# Patient Record
Sex: Male | Born: 1955
Health system: Southern US, Community
[De-identification: ages and names within clinical notes are randomized; demographics above are authoritative.]

## PROBLEM LIST (undated history)

## (undated) DIAGNOSIS — F32A Depression, unspecified: Secondary | ICD-10-CM

## (undated) DIAGNOSIS — F329 Major depressive disorder, single episode, unspecified: Secondary | ICD-10-CM

## (undated) DIAGNOSIS — F988 Other specified behavioral and emotional disorders with onset usually occurring in childhood and adolescence: Secondary | ICD-10-CM

## (undated) DIAGNOSIS — Z9889 Other specified postprocedural states: Secondary | ICD-10-CM

## (undated) DIAGNOSIS — I10 Essential (primary) hypertension: Secondary | ICD-10-CM

## (undated) DIAGNOSIS — E119 Type 2 diabetes mellitus without complications: Secondary | ICD-10-CM

## (undated) DIAGNOSIS — E785 Hyperlipidemia, unspecified: Secondary | ICD-10-CM

## (undated) DIAGNOSIS — R7303 Prediabetes: Secondary | ICD-10-CM

## (undated) DIAGNOSIS — R51 Headache: Secondary | ICD-10-CM

## (undated) DIAGNOSIS — R519 Headache, unspecified: Secondary | ICD-10-CM

## (undated) DIAGNOSIS — K7460 Unspecified cirrhosis of liver: Secondary | ICD-10-CM

## (undated) DIAGNOSIS — G4730 Sleep apnea, unspecified: Secondary | ICD-10-CM

## (undated) DIAGNOSIS — G4733 Obstructive sleep apnea (adult) (pediatric): Secondary | ICD-10-CM

## (undated) DIAGNOSIS — G459 Transient cerebral ischemic attack, unspecified: Secondary | ICD-10-CM

## (undated) DIAGNOSIS — F419 Anxiety disorder, unspecified: Secondary | ICD-10-CM

## (undated) DIAGNOSIS — I878 Other specified disorders of veins: Secondary | ICD-10-CM

## (undated) DIAGNOSIS — I639 Cerebral infarction, unspecified: Secondary | ICD-10-CM

## (undated) DIAGNOSIS — C801 Malignant (primary) neoplasm, unspecified: Secondary | ICD-10-CM

## (undated) DIAGNOSIS — E1140 Type 2 diabetes mellitus with diabetic neuropathy, unspecified: Secondary | ICD-10-CM

## (undated) DIAGNOSIS — F909 Attention-deficit hyperactivity disorder, unspecified type: Secondary | ICD-10-CM

## (undated) HISTORY — DX: Transient cerebral ischemic attack, unspecified: G45.9

## (undated) HISTORY — DX: Other specified disorders of veins: I87.8

## (undated) HISTORY — DX: Depression, unspecified: F32.A

## (undated) HISTORY — PX: CARPAL TUNNEL RELEASE: SHX101

## (undated) HISTORY — DX: Other specified behavioral and emotional disorders with onset usually occurring in childhood and adolescence: F98.8

## (undated) HISTORY — DX: Anxiety disorder, unspecified: F41.9

## (undated) HISTORY — DX: Obstructive sleep apnea (adult) (pediatric): G47.33

## (undated) HISTORY — DX: Sleep apnea, unspecified: G47.30

## (undated) HISTORY — DX: Hyperlipidemia, unspecified: E78.5

## (undated) HISTORY — PX: KNEE SURGERY: SHX244

## (undated) HISTORY — DX: Major depressive disorder, single episode, unspecified: F32.9

## (undated) HISTORY — PX: NECK SURGERY: SHX720

## (undated) HISTORY — DX: Essential (primary) hypertension: I10

## (undated) HISTORY — DX: Cerebral infarction, unspecified: I63.9

## (undated) HISTORY — DX: Type 2 diabetes mellitus without complications: E11.9

## (undated) HISTORY — DX: Prediabetes: R73.03

## (undated) HISTORY — DX: Other specified postprocedural states: Z98.890

## (undated) HISTORY — PX: VASECTOMY: SHX75

## (undated) HISTORY — DX: Unspecified cirrhosis of liver: K74.60

## (undated) HISTORY — DX: Type 2 diabetes mellitus with diabetic neuropathy, unspecified: E11.40

---

## 1985-03-07 DIAGNOSIS — F419 Anxiety disorder, unspecified: Secondary | ICD-10-CM

## 1985-03-07 HISTORY — DX: Anxiety disorder, unspecified: F41.9

## 2000-11-13 ENCOUNTER — Ambulatory Visit: Admission: RE | Admit: 2000-11-13 | Discharge: 2000-11-13 | Payer: Self-pay | Admitting: Family Medicine

## 2003-02-03 ENCOUNTER — Observation Stay (HOSPITAL_COMMUNITY): Admission: EM | Admit: 2003-02-03 | Discharge: 2003-02-04 | Payer: Self-pay | Admitting: Emergency Medicine

## 2003-02-19 ENCOUNTER — Ambulatory Visit (HOSPITAL_COMMUNITY): Admission: RE | Admit: 2003-02-19 | Discharge: 2003-02-19 | Payer: Self-pay | Admitting: Family Medicine

## 2003-06-19 IMAGING — CR DG CHEST 2V
2 series · 2 of 2 positions shown · non-contrast
Comparison: none

CLINICAL DATA: Cervical hernia nucleus pulposus. Patient has hypertension. No present chest complaints. Nonsmoker.
 TWO VIEW CHEST
 PA and lateral views of the chest without previous films for comparison at this time show the heart and lungs, bony thorax and soft tissues to be within normal limits.
 IMPRESSION 
 Normal chest.

[view not recorded (1 of 2)]
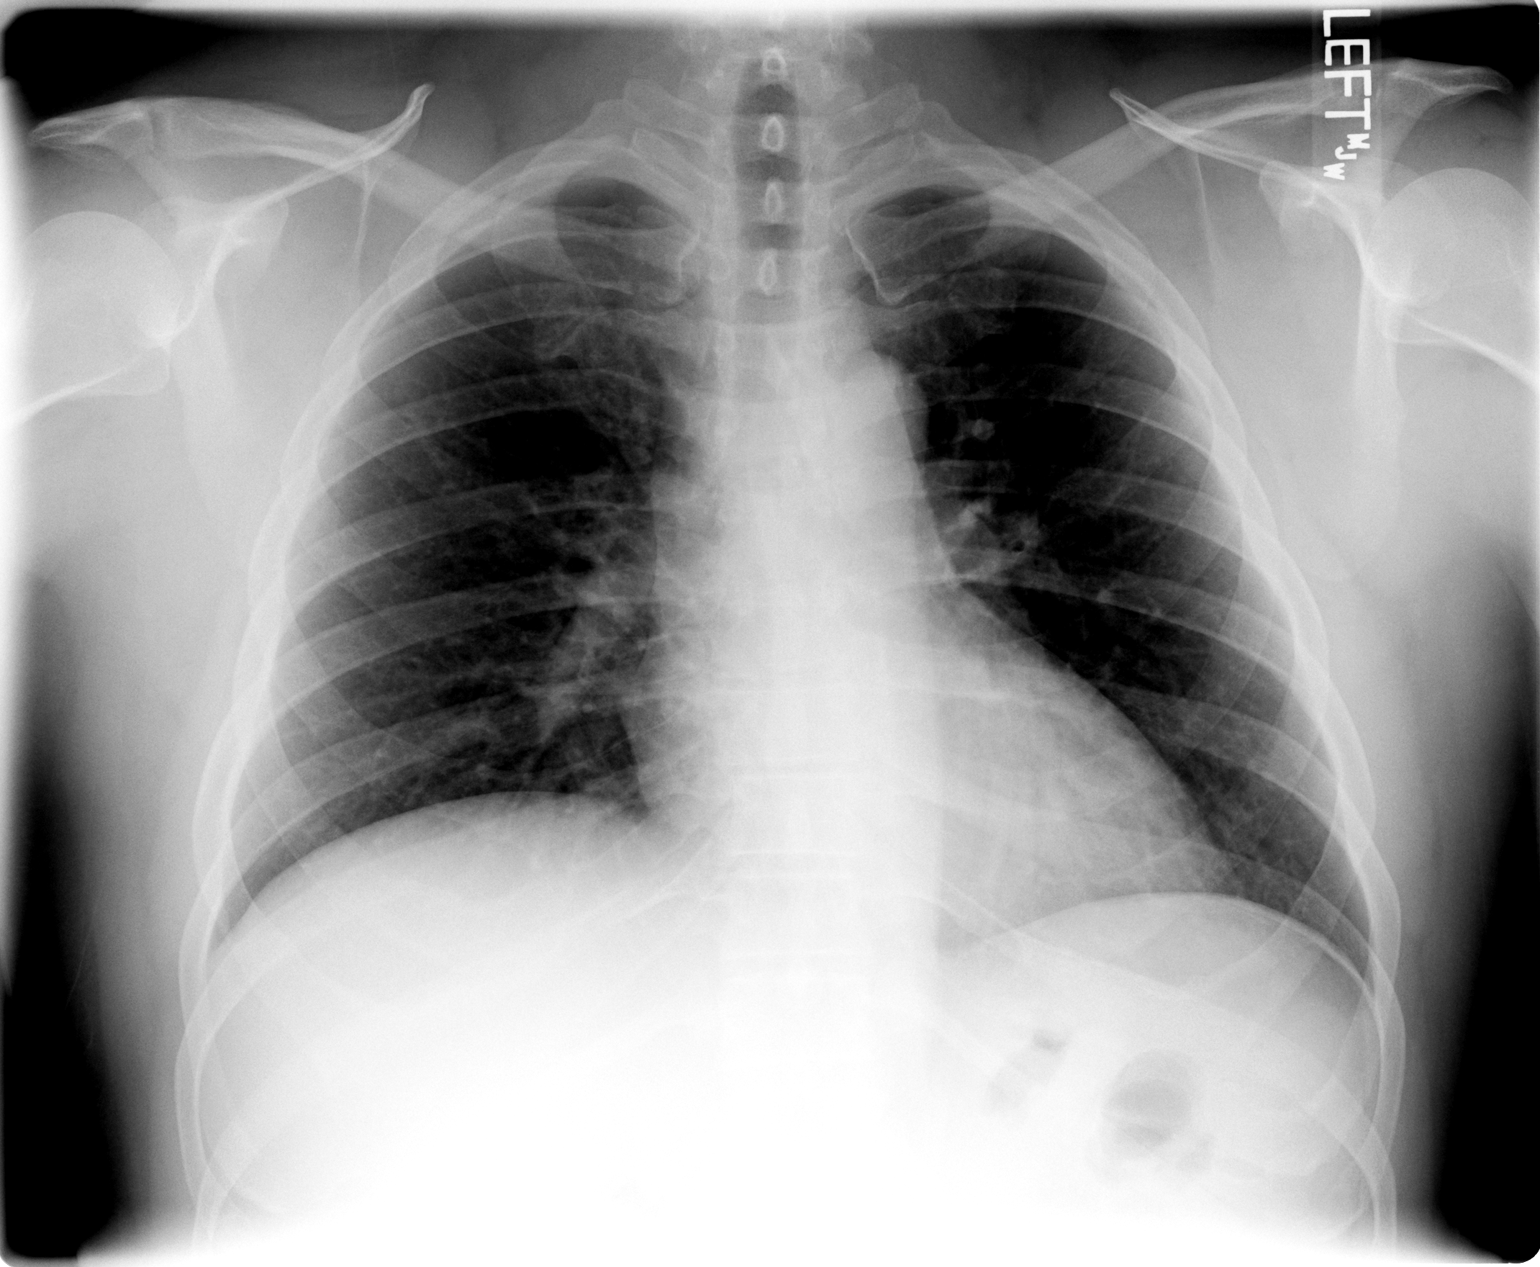

[view not recorded (2 of 2)]
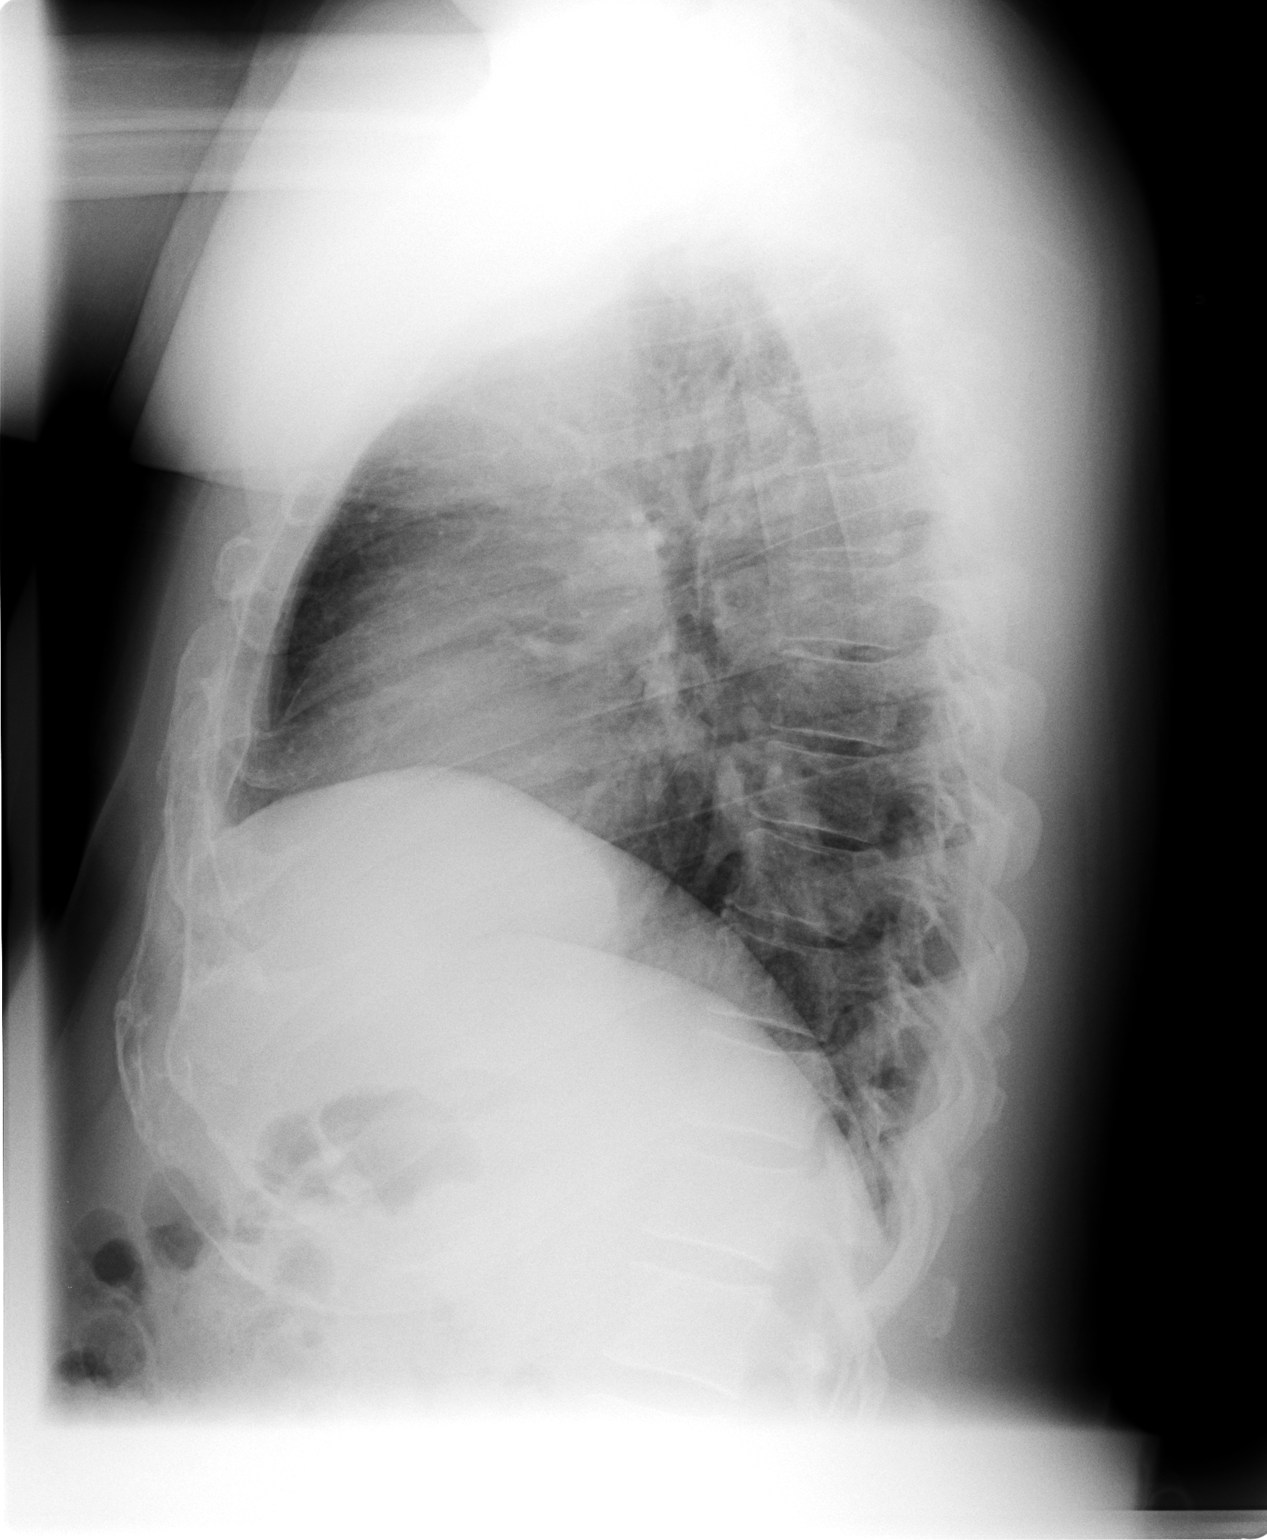

[2 of 2 positions shown; findings below may reference images not displayed]

## 2003-06-20 ENCOUNTER — Ambulatory Visit (HOSPITAL_COMMUNITY): Admission: RE | Admit: 2003-06-20 | Discharge: 2003-06-20 | Payer: Self-pay | Admitting: Neurosurgery

## 2003-06-20 IMAGING — RF DG CERVICAL SPINE 1V
1 series · 2 of 2 positions shown · non-contrast
Comparison: none

CLINICAL DATA: Anterior cervical diskectomy and fusion. 
C-ARM FLUOROSCOPY
Fluoroscopy was utilized by the requesting physician.

[Series 1: run · 2 of 2 slices shown]
[im 1/2]
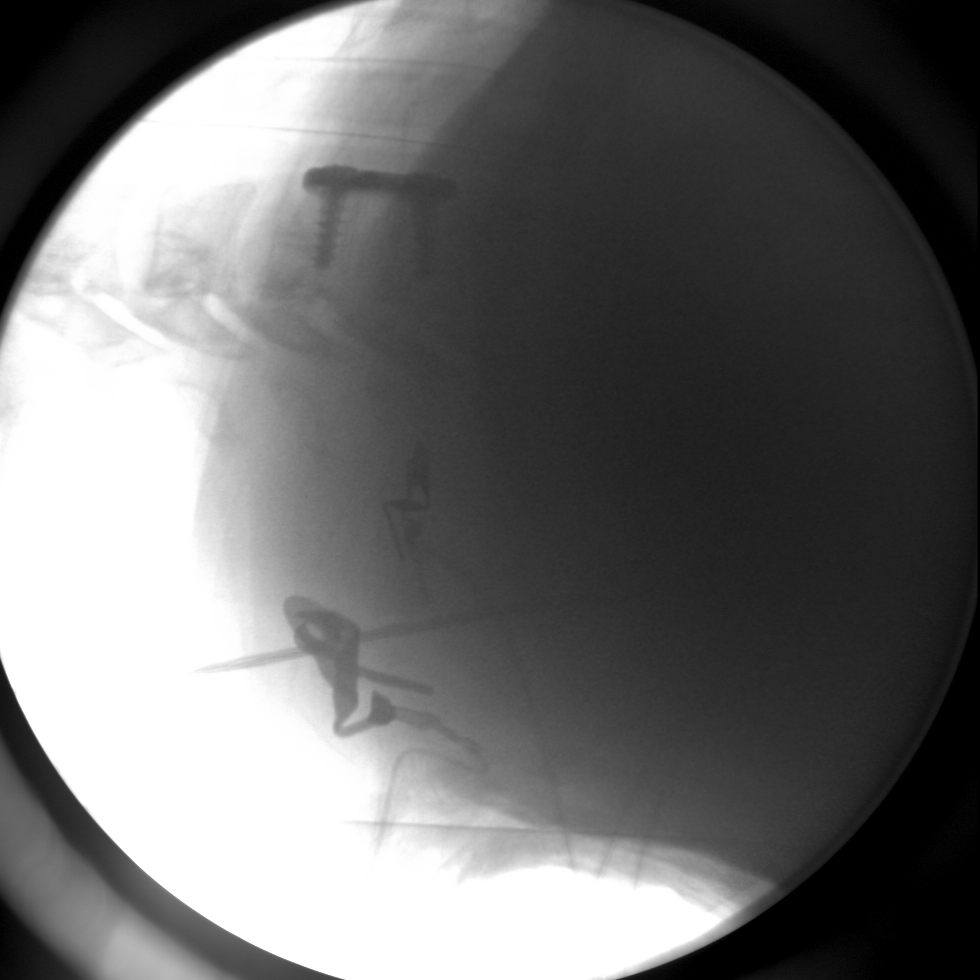
[im 2/2]
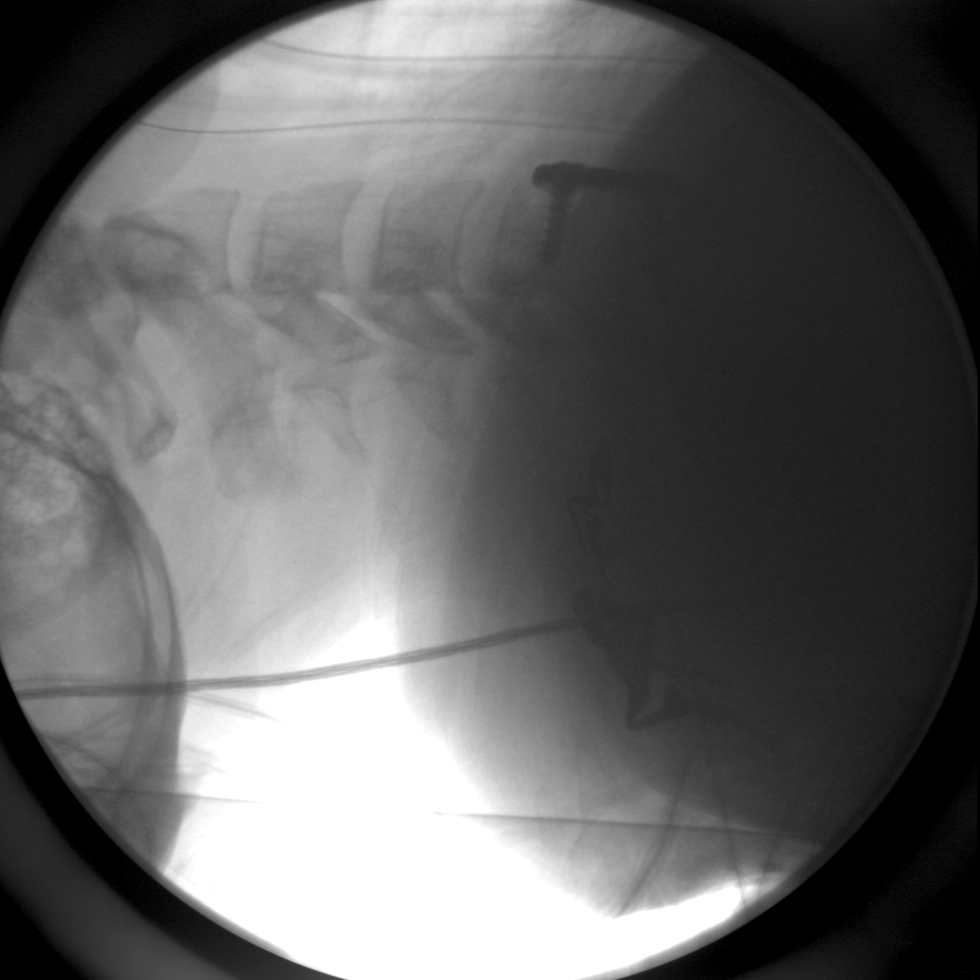

[2 of 2 positions shown; findings below may reference images not displayed]

IMPRESSION
No radiographic interpretation. 

DIAGNOSTIC CERVICAL SPINE ONE VIEW
Lateral and lateral swimmer?s views of the cervical spine were obtained in the operating room.  These demonstrate anterior cervical diskectomy and fusion from C5 through C6 with an anterior plate and screws.  Presumed intervertebral bone plug is not well visualized.  There is limited visualization inferior to C6. 

IMPRESSION
Intraoperative views following C5-6 anterior cervical diskectomy and fusion.  No complication is evident.  There is limited visualization inferior to C6.

## 2003-08-20 ENCOUNTER — Encounter: Admission: RE | Admit: 2003-08-20 | Discharge: 2003-08-20 | Payer: Self-pay | Admitting: Neurosurgery

## 2003-08-20 IMAGING — CR DG CERVICAL SPINE 2 OR 3 VIEWS
3 series · 3 of 3 positions shown · non-contrast
Comparison: none

CLINICAL DATA: Neck pain.   Status post ACDF. 
 CERVICAL SPINE, THREE VIEWS
 Lateral views obtained in neutral and in flexion and extension show the patient is status post C5-6 ACDF with anterior plating.  In the neutral position, there is loss of the normal lordosis.  No instability demonstrated through flexion or extension. 
 IMPRESSION
 Good position and alignment at C5-6 following ACDF with anterior plating.

[view not recorded (1 of 3)]
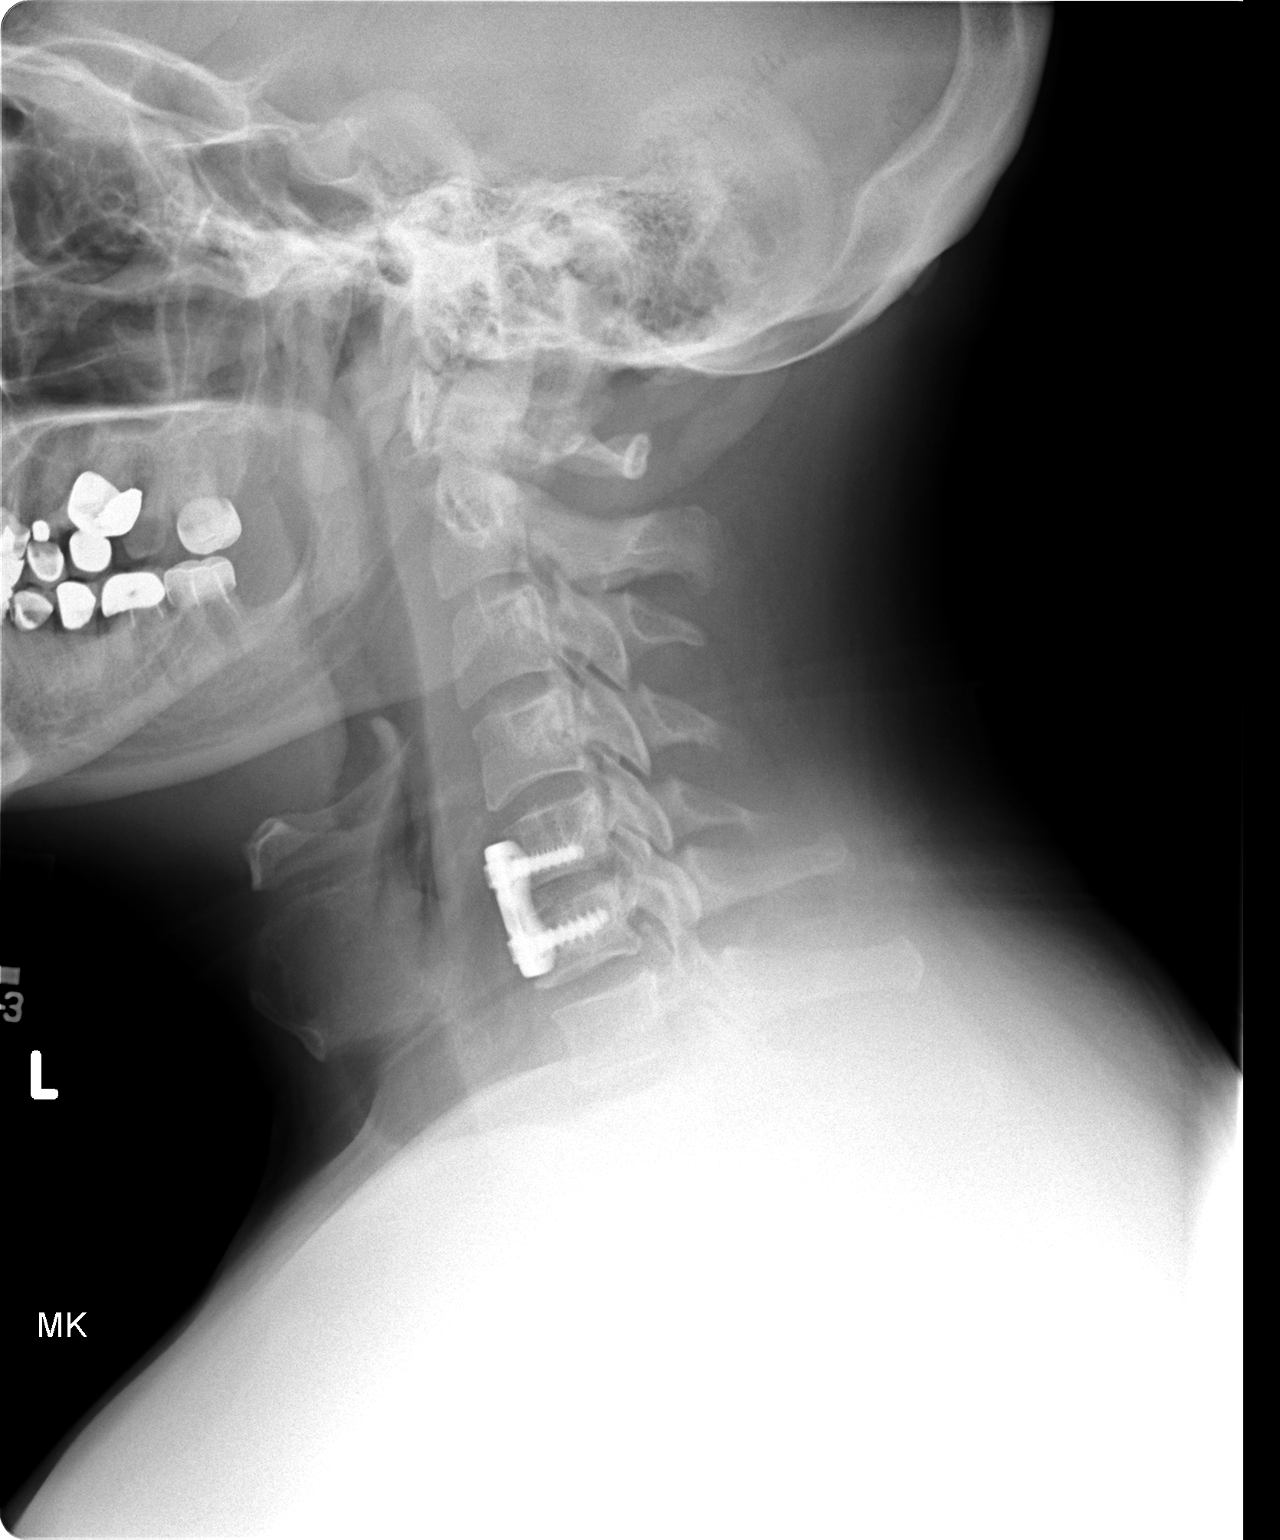

[view not recorded (2 of 3)]
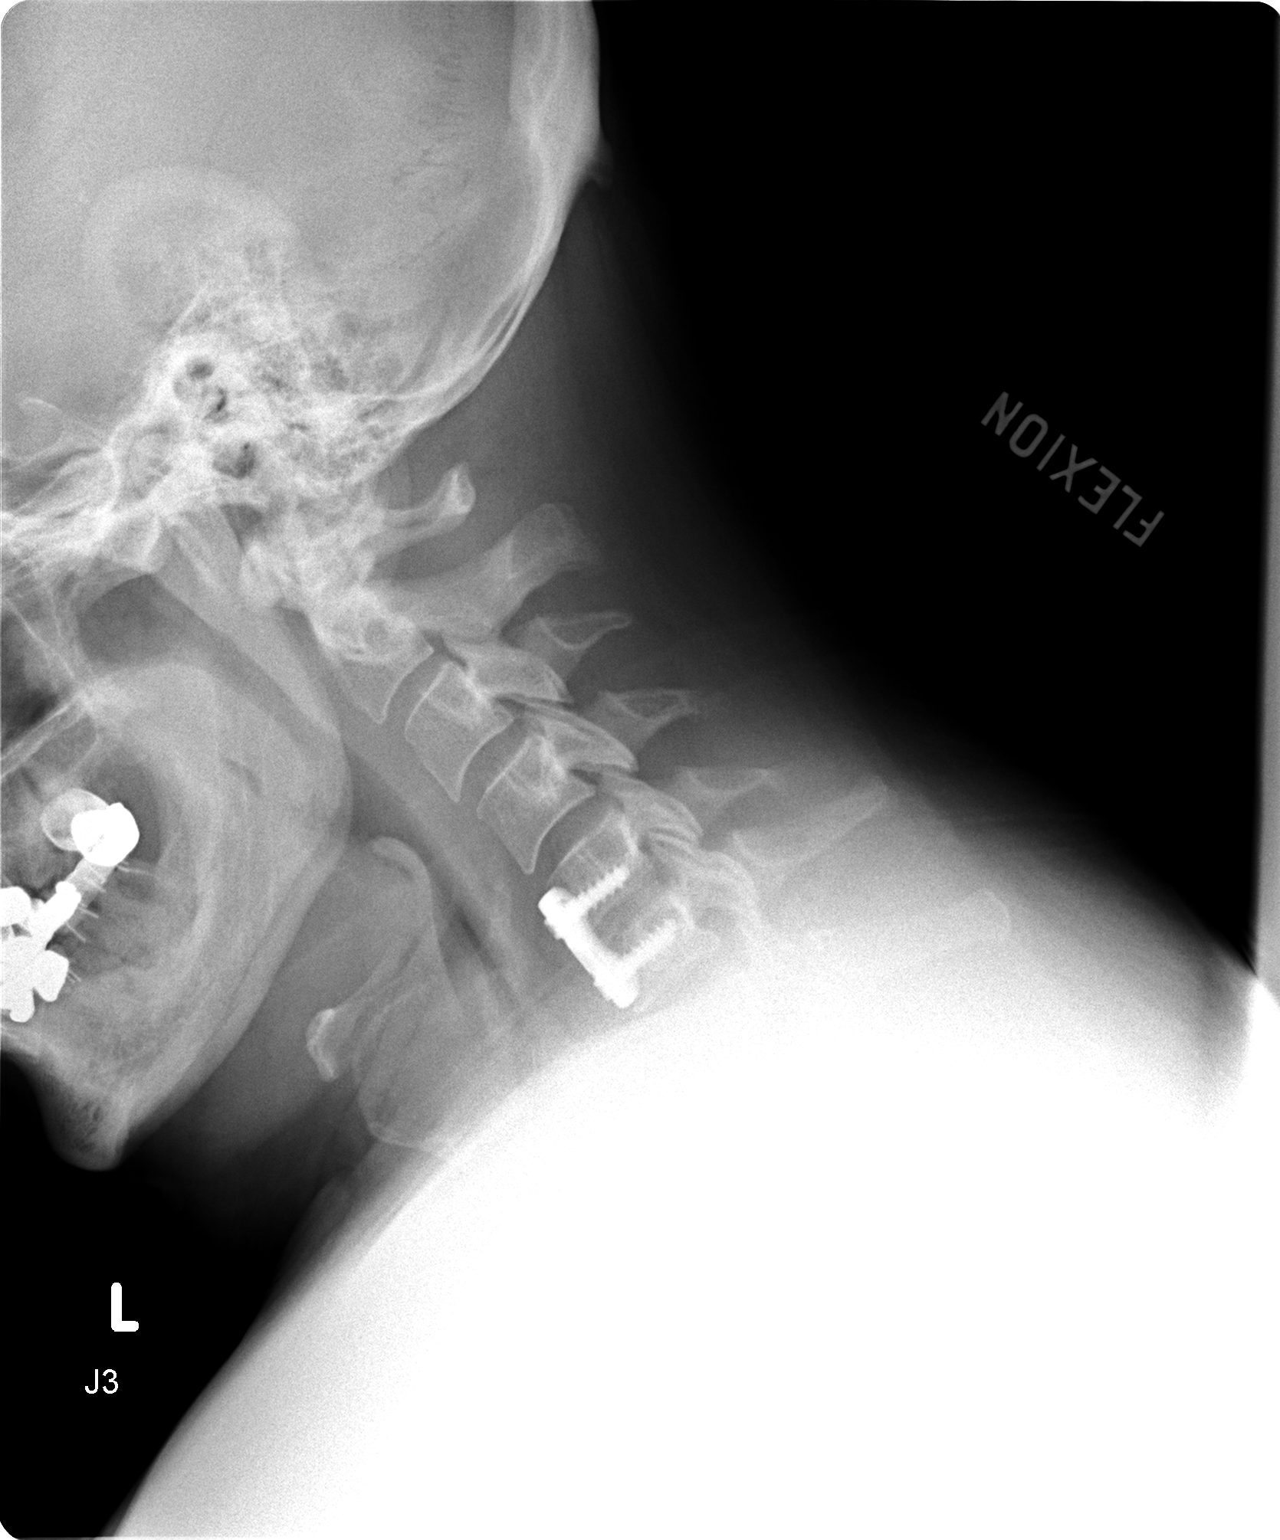

[view not recorded (3 of 3)]
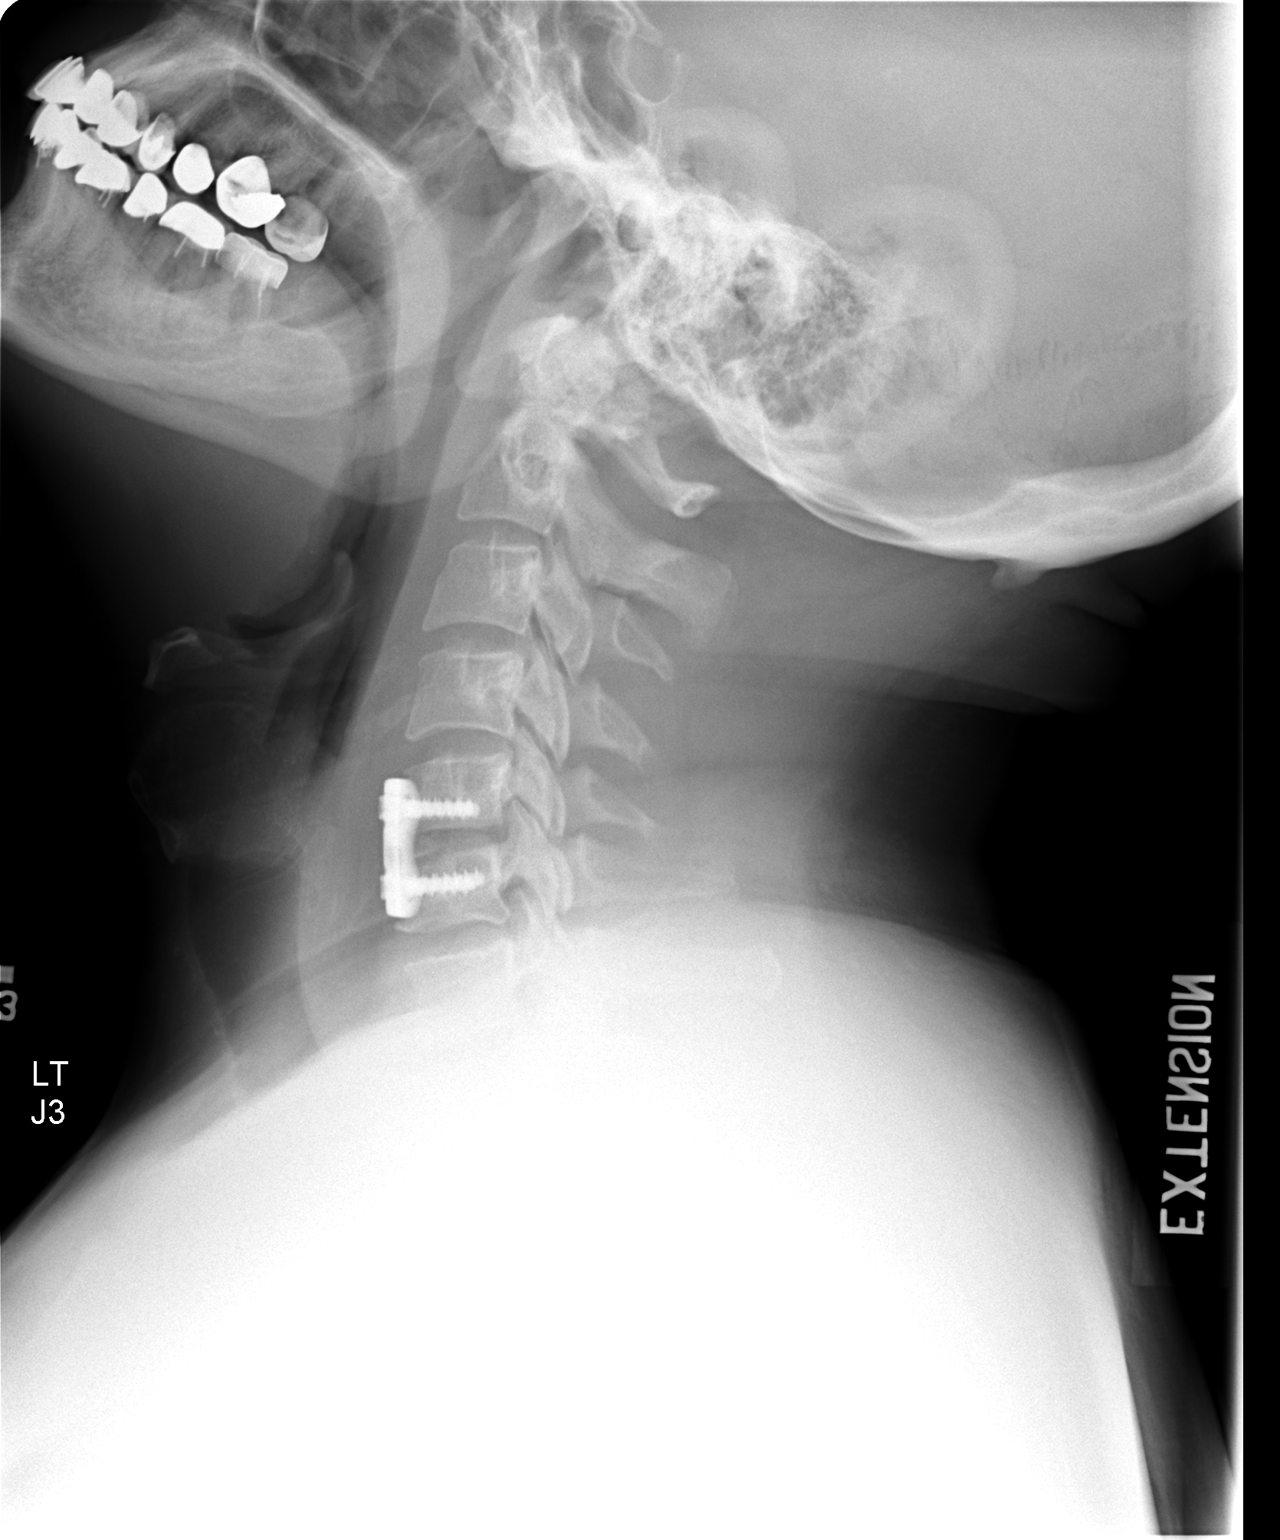

[3 of 3 positions shown; findings below may reference images not displayed]

## 2004-06-16 ENCOUNTER — Encounter: Admission: RE | Admit: 2004-06-16 | Discharge: 2004-06-16 | Payer: Self-pay | Admitting: Neurosurgery

## 2004-06-16 IMAGING — CR DG CERVICAL SPINE 2 OR 3 VIEWS
4 series · 4 of 4 positions shown · non-contrast
Comparison: none

CLINICAL DATA: Anterior cervical spine fusion.
 CERVICAL SPINE:
 Three views of the cervical spine were obtained, in neutral, flexion, and extension, and compared to films of [DATE].  Anterior fusion at C5-6 appears stable.  The interbody fusion plug may be slightly compressed but has not changed position and the anterior metallic fixation plate remains in good position with normal alignment present.  Through flexion and extension, there is normal range of  motion with no malalignment.

[w c-spine lat]
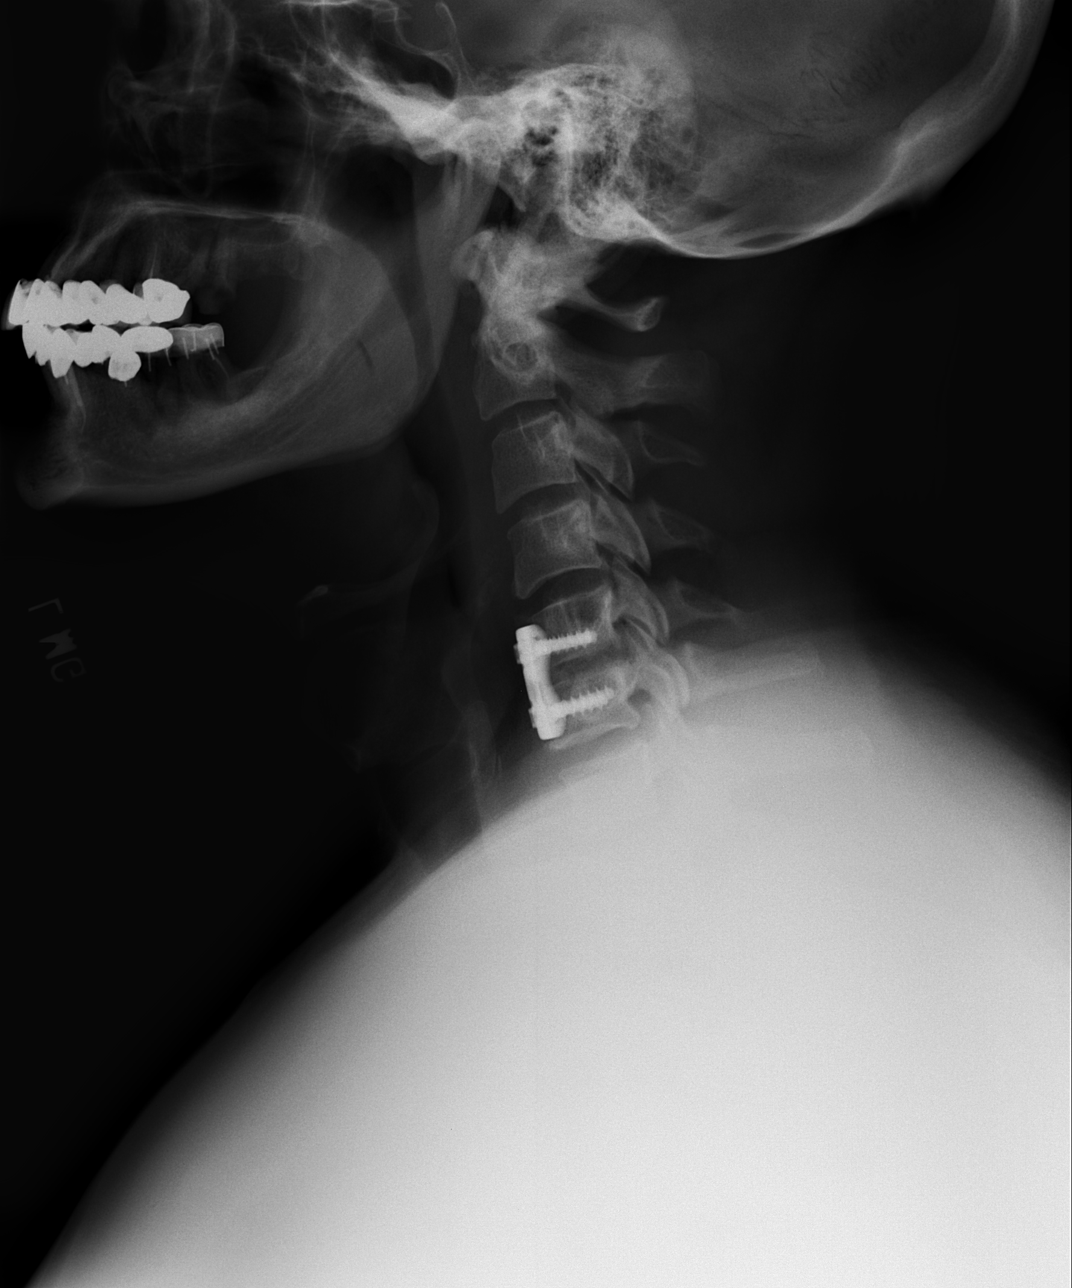

[w swimmers view *]
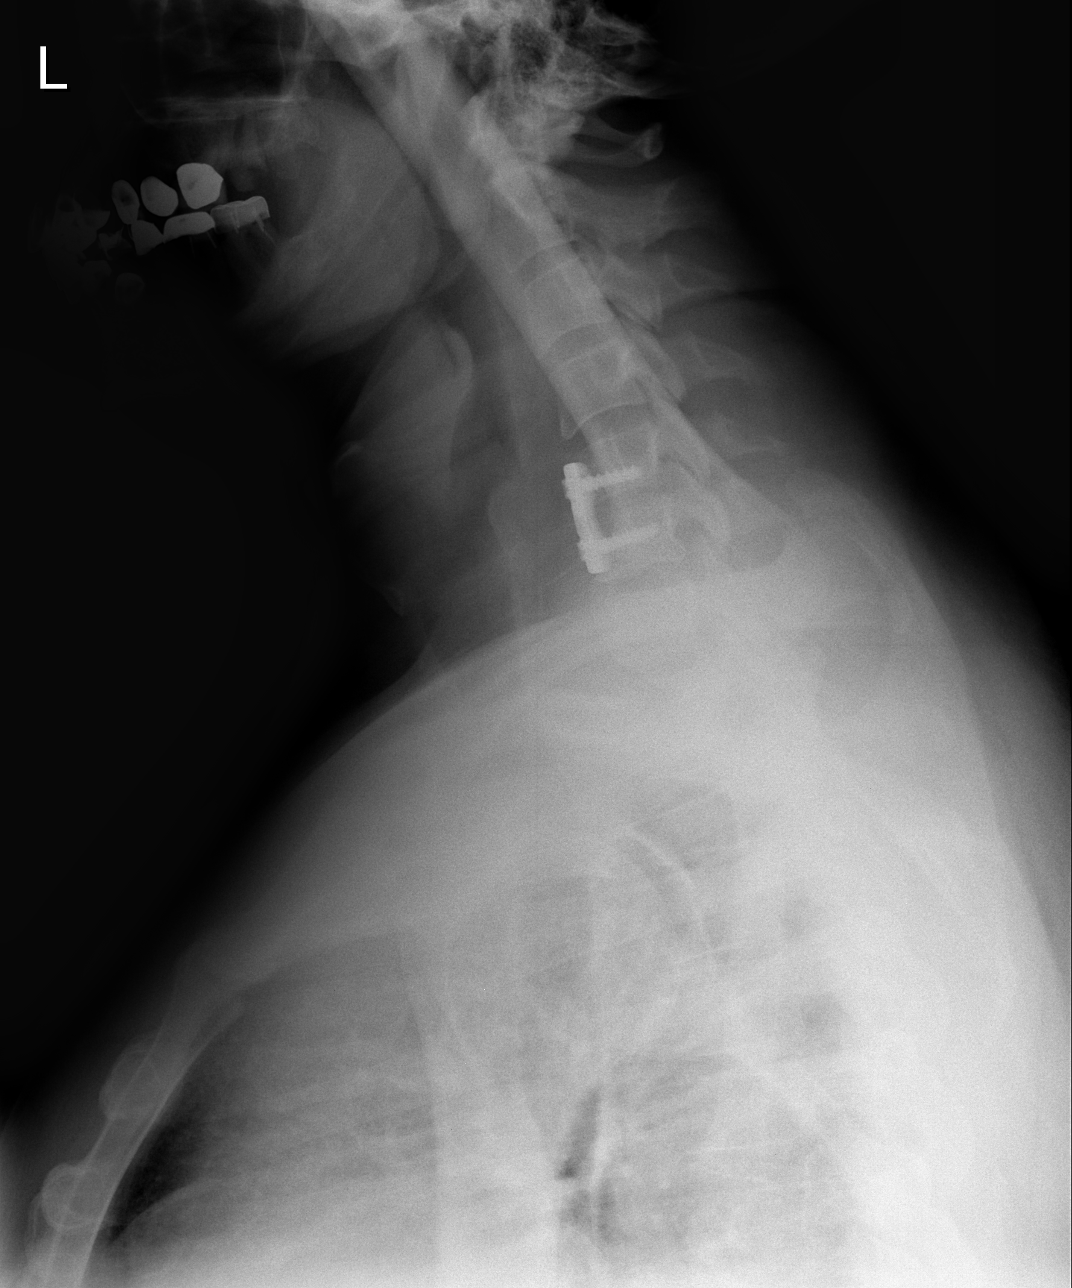

[w c-spine flexion]
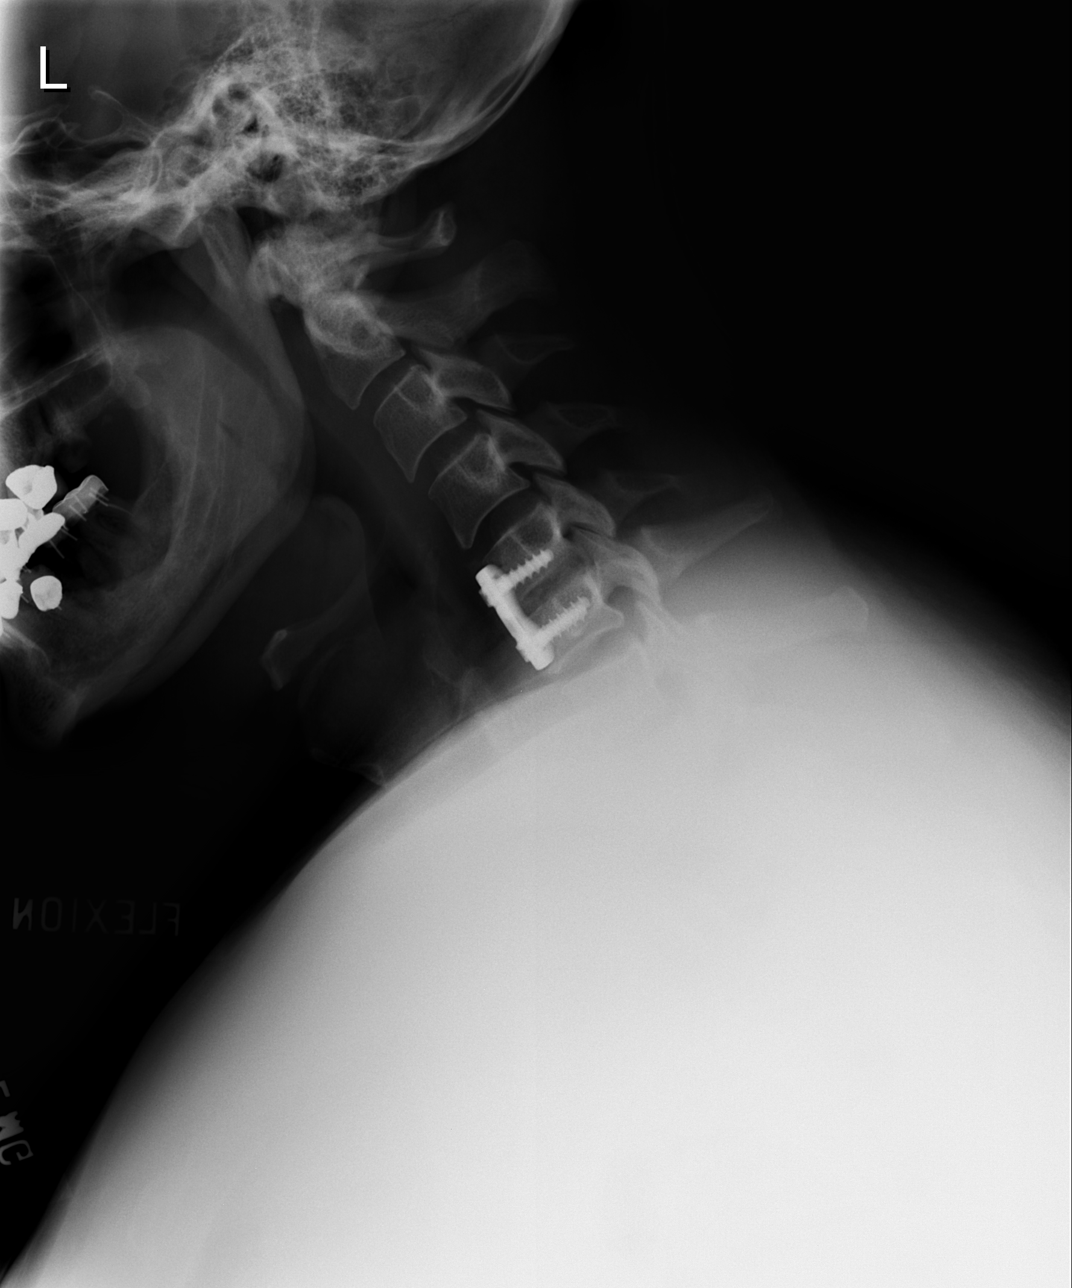

[w c-spine extension]
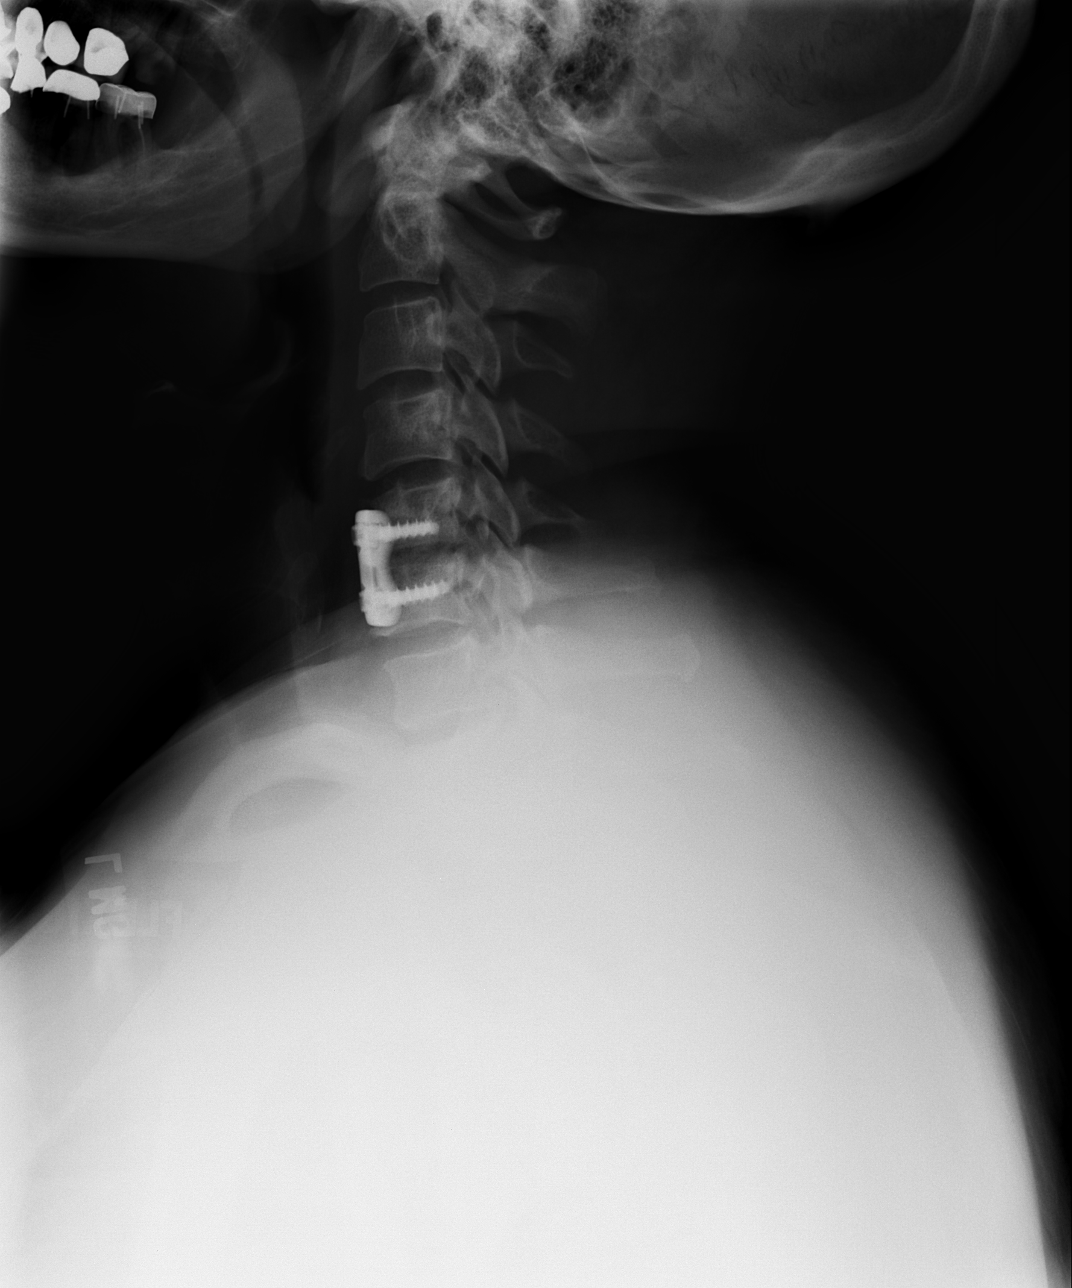

[4 of 4 positions shown; findings below may reference images not displayed]

IMPRESSION: Stable anterior fusion at C5-6.  Normal range of  motion through flexion and extension with no malalignment.

## 2005-03-25 ENCOUNTER — Ambulatory Visit (HOSPITAL_COMMUNITY): Admission: RE | Admit: 2005-03-25 | Discharge: 2005-03-25 | Payer: Self-pay | Admitting: Family Medicine

## 2005-03-25 IMAGING — US US CAROTID DUPLEX BILAT
1 series · 14 of 24 positions shown · non-contrast
Comparison: none

HISTORY: Smoking, hypertension, visual disturbances, loss of vision left eye

[Series 1: unknown · 0.09mm/px · 14 of 48 slices shown]
[im 1/48]
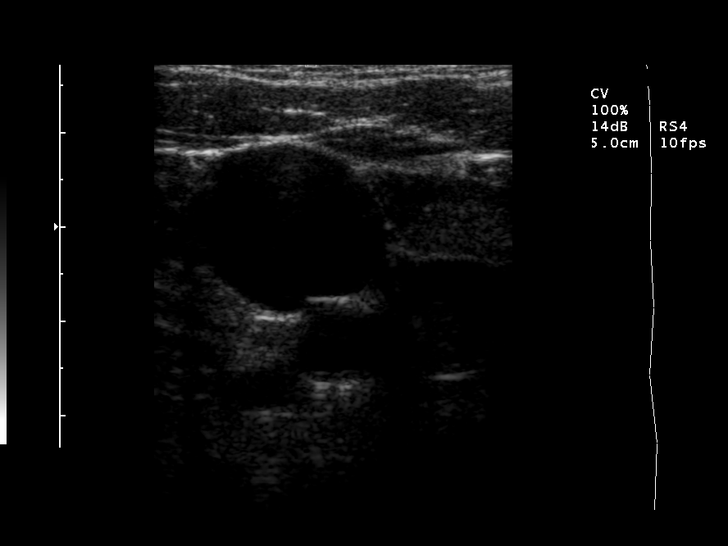
[im 5/48]
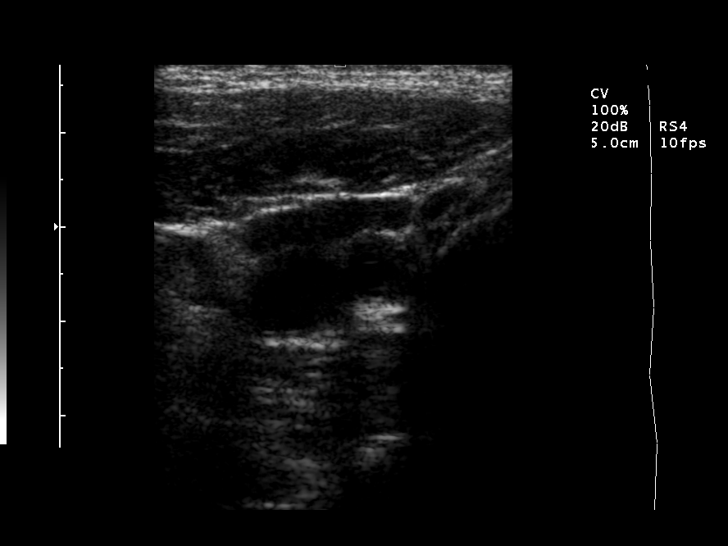
[im 9/48]
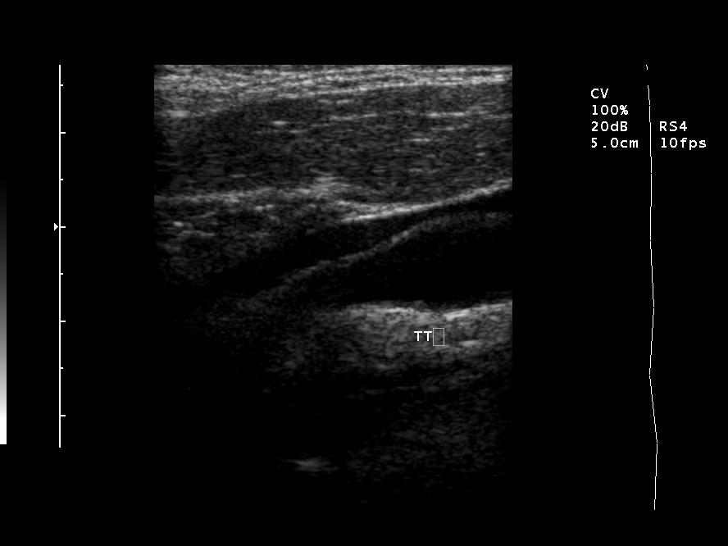
[im 13/48]
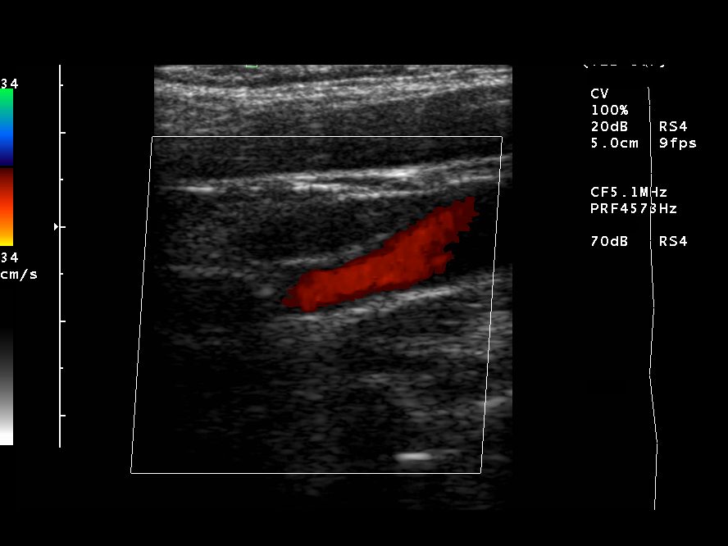
[im 15/48]
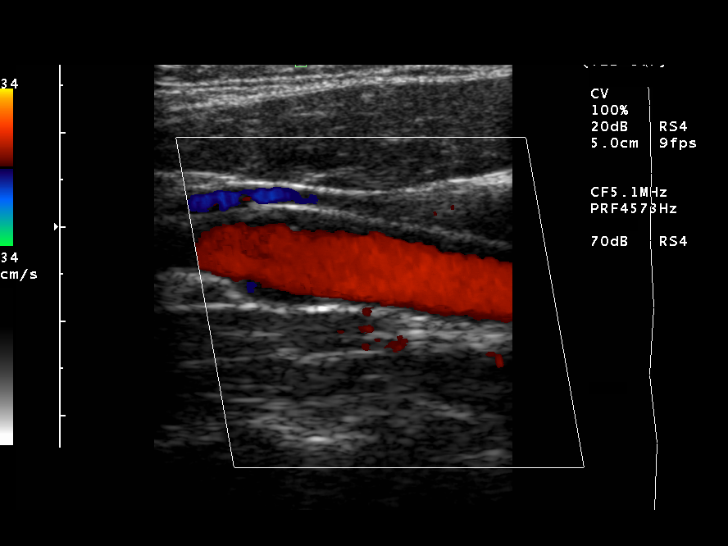
[im 19/48]
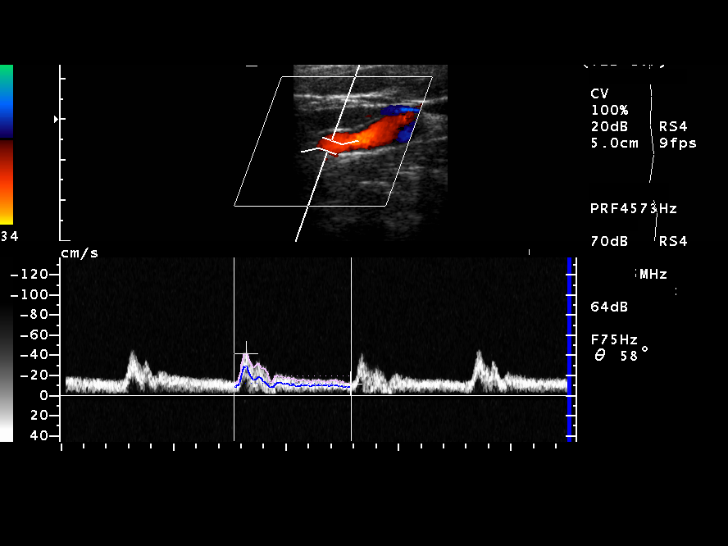
[im 23/48]
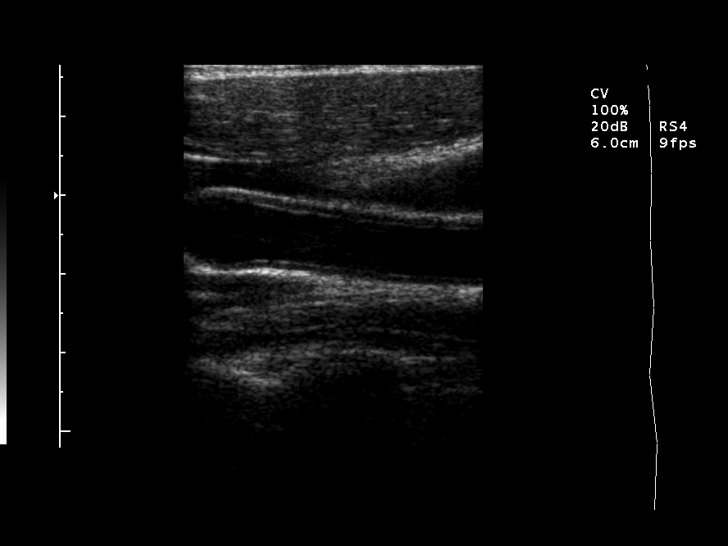
[im 25/48]
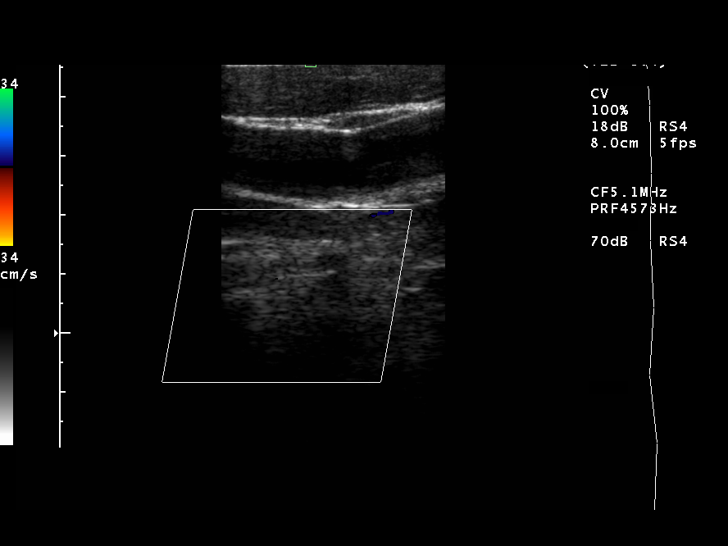
[im 29/48]
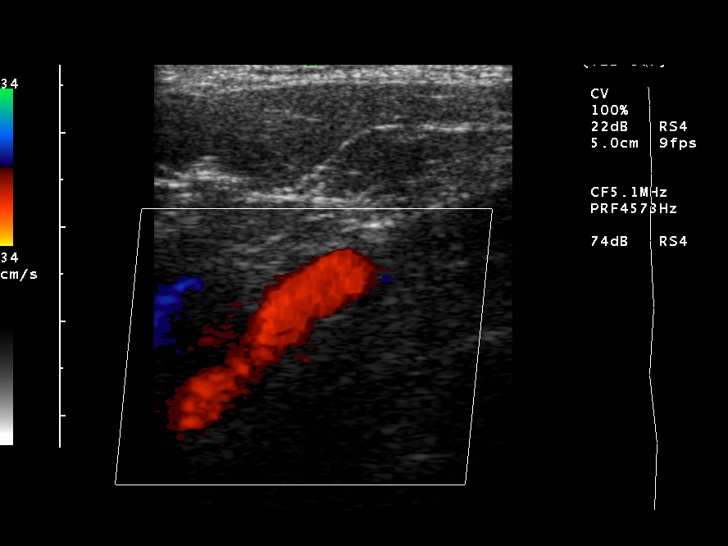
[im 33/48]
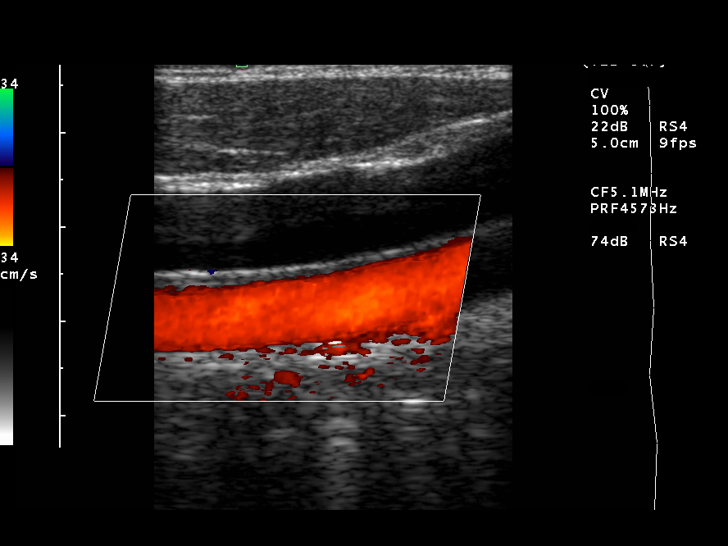
[im 37/48]
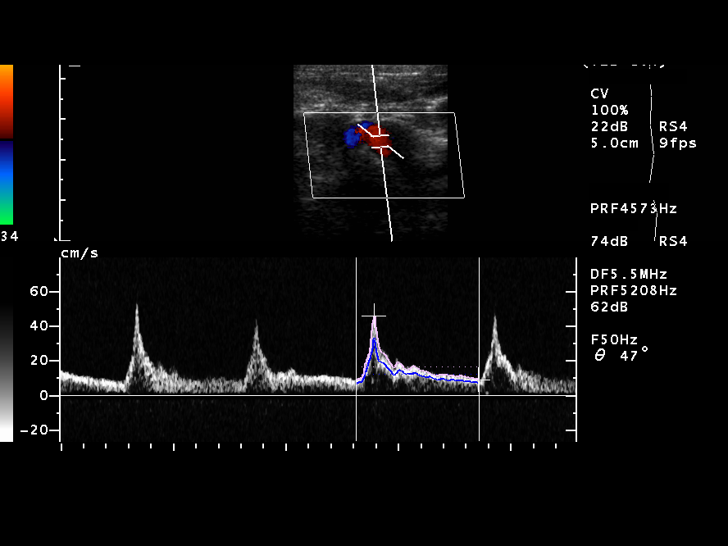
[im 39/48]
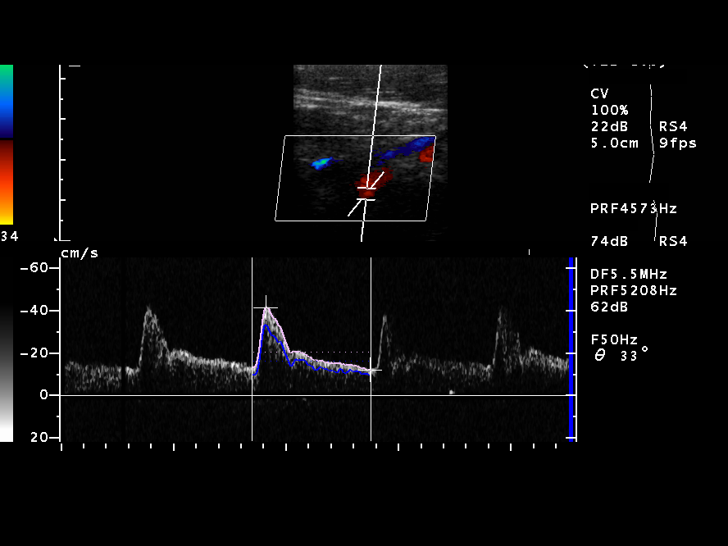
[im 43/48]
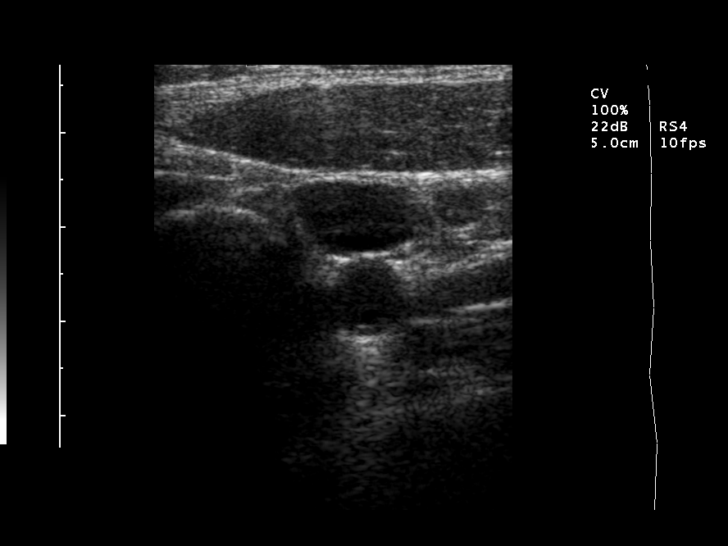
[im 48/48]
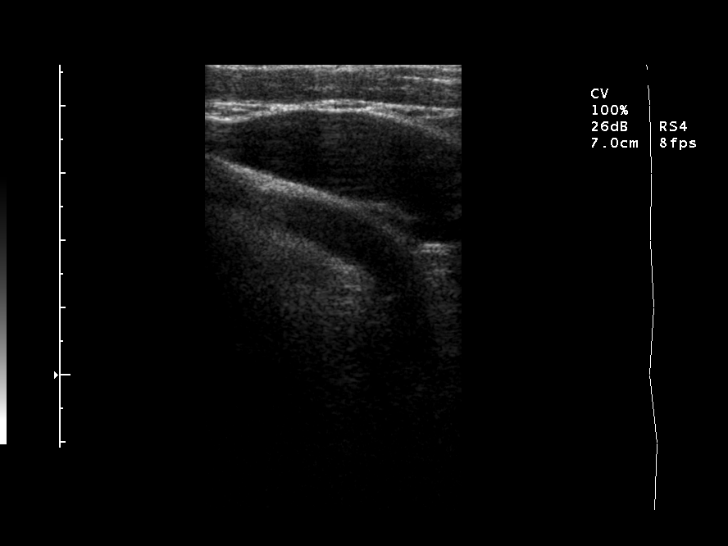

[14 of 24 positions shown; findings below may reference images not displayed]

ULTRASOUND CAROTID DUPLEX BILATERAL:

Gray scale, color Doppler, and pulse wave imaging of carotid systems performed
bilaterally. 

Scattered intimal thickening in carotid systems.
Mildly tortuous left common carotid artery.
No high velocity jet identified on color Doppler imaging.
Minimal spectral broadening right and left ECA on wave form analysis.
No flow identified in right vertebral artery.
Antegrade flow left vertebral artery.
Following peak systolic velocities obtained, in cm per second:

Right CCA 77
Right ICA 69
Right ECA 100
Right ICA/CCA ratio

Left CCA 78 left ICA 50
Left ECA 60
Left ICA/CCA ratio
IMPRESSION: No evidence of significant plaque formation or stenosis within carotid systems.
No blood flow identified right vertebral artery, question occlusion versus very
slow flow.

## 2005-03-25 IMAGING — CT CT HEAD W/O CM
1 series · 16 of 30 positions shown, 20 images · non-contrast
Comparison: none

HISTORY: Headache, hypertension, left eye occlusion

[Series 940: — · axial · 0.49mm/px · z∈[-617,-482]mm · 16 of 30 slices shown, 20 images]
[im 2/30  brain]
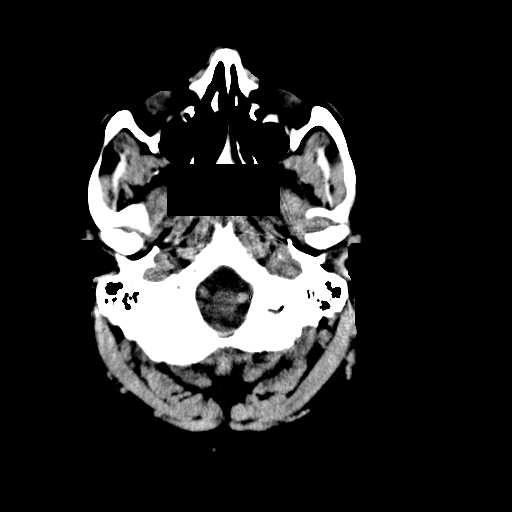
[im 2/30  bone]
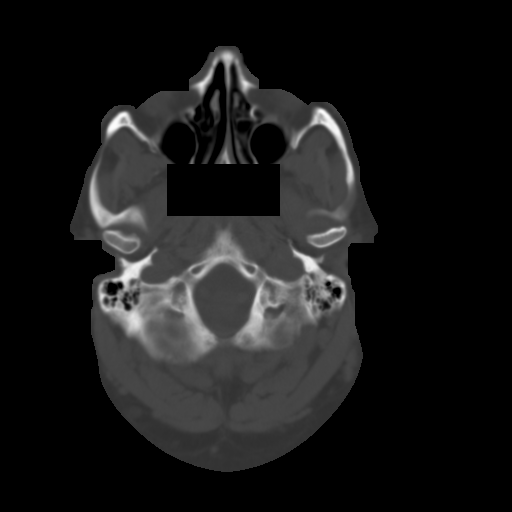
[im 4/30  brain]
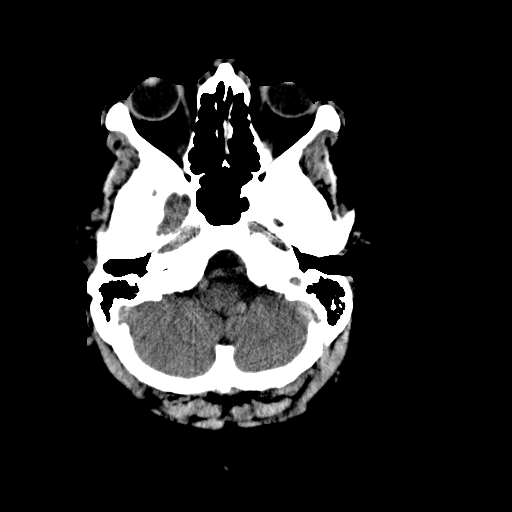
[im 6/30  brain]
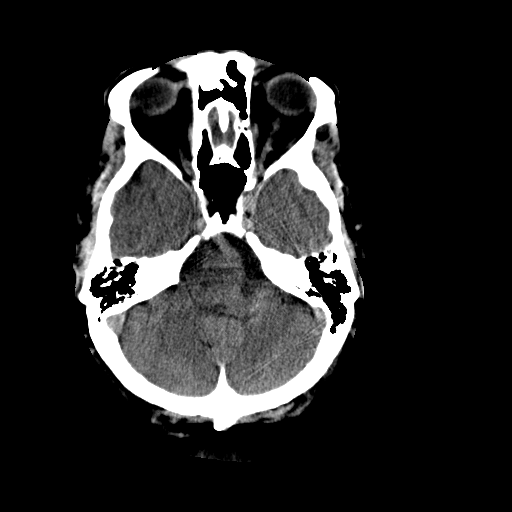
[im 8/30  brain]
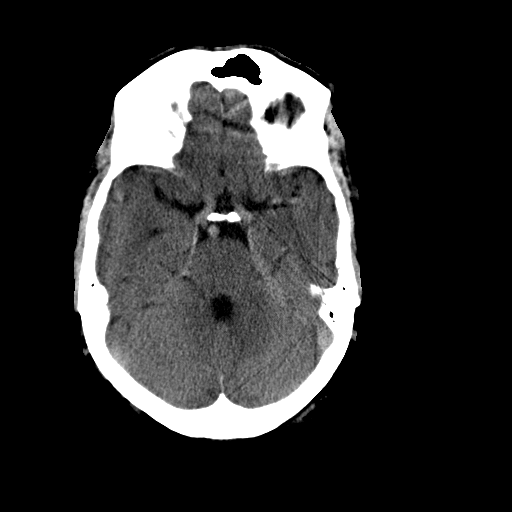
[im 9/30  brain]
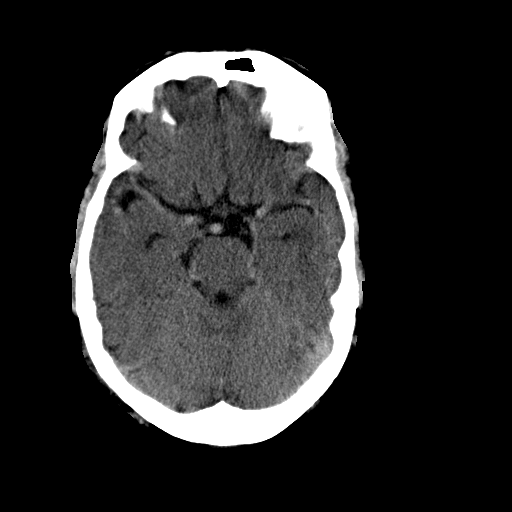
[im 9/30  bone]
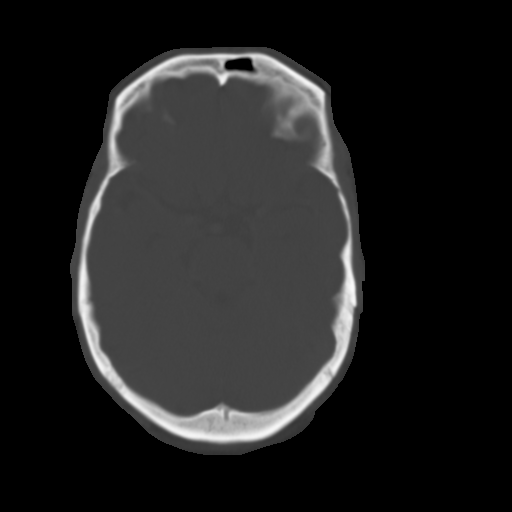
[im 11/30  brain]
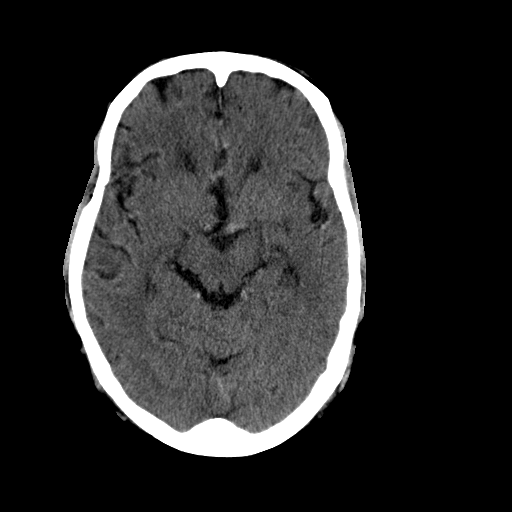
[im 13/30  brain]
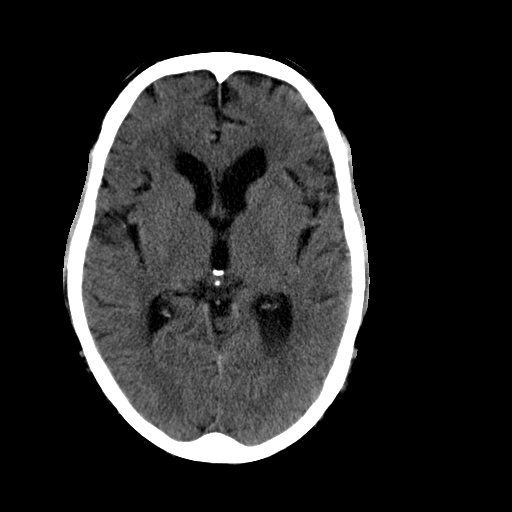
[im 15/30  brain]
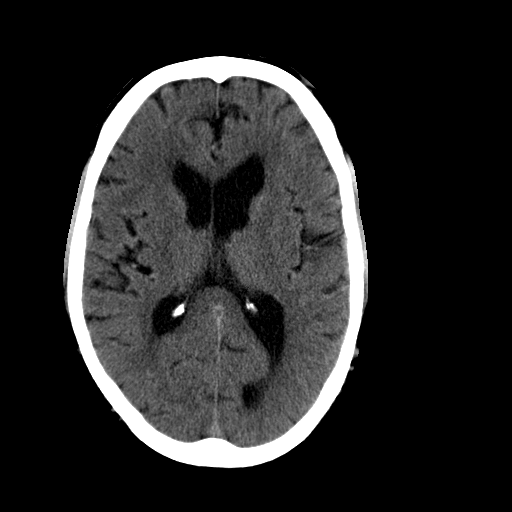
[im 16/30  brain]
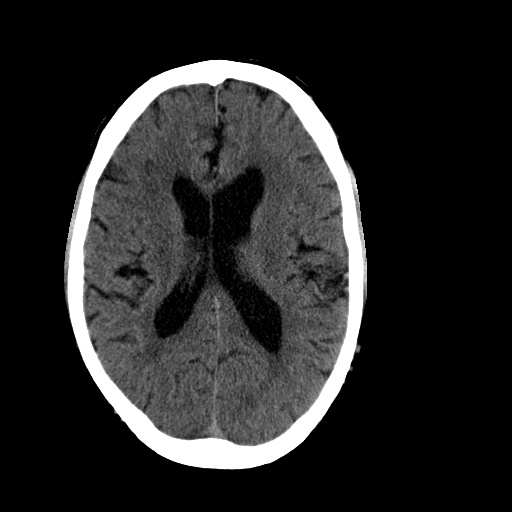
[im 16/30  bone]
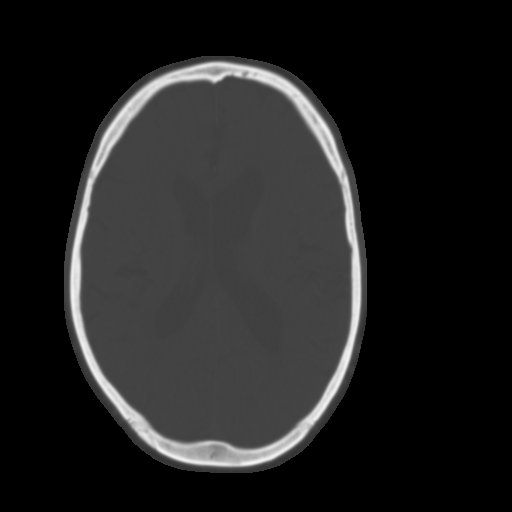
[im 18/30  brain]
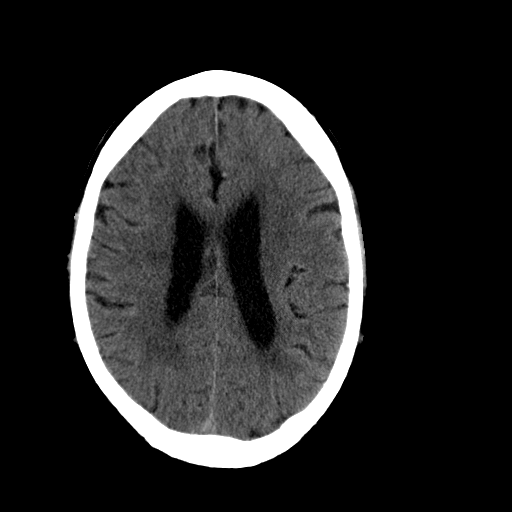
[im 20/30  brain]
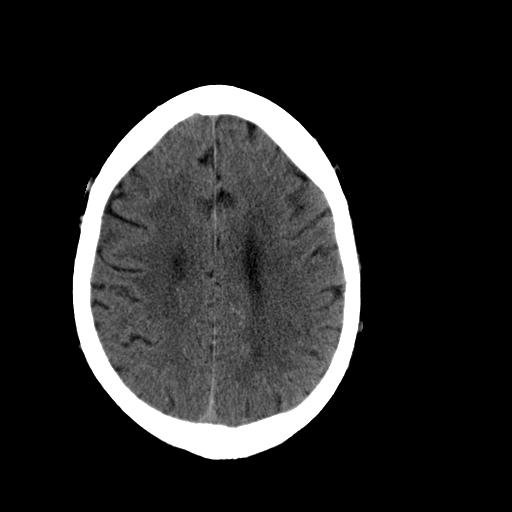
[im 22/30  brain]
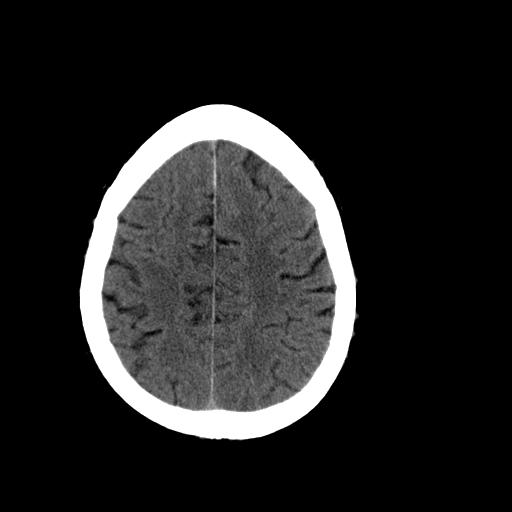
[im 23/30  brain]
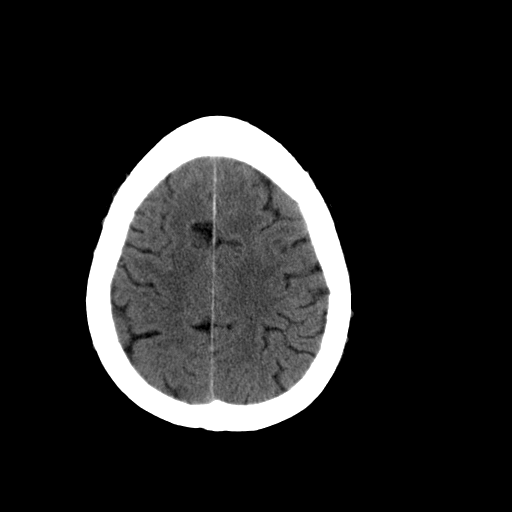
[im 23/30  bone]
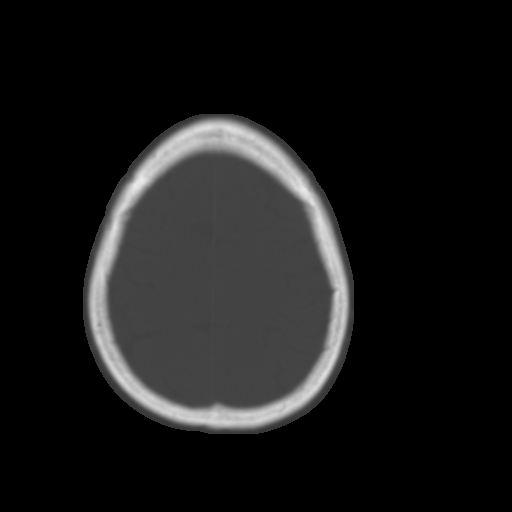
[im 25/30  brain]
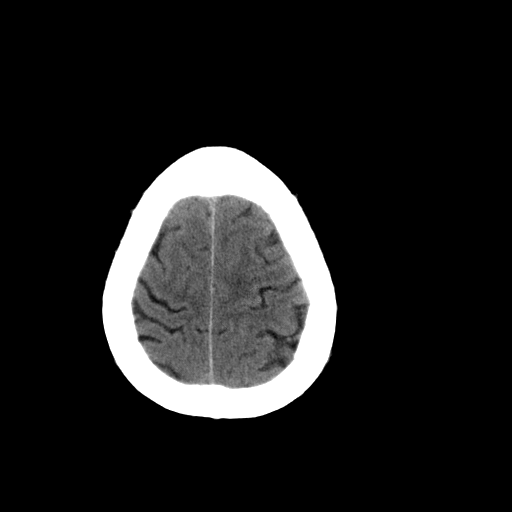
[im 27/30  brain]
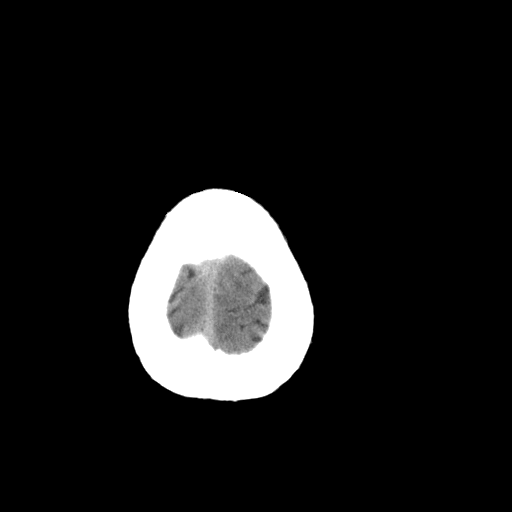
[im 29/30  brain]
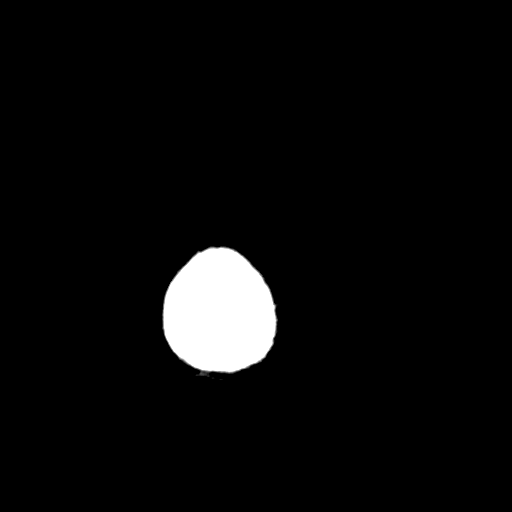

[16 of 30 positions shown; findings below may reference images not displayed]

CT HEAD WITHOUT CONTRAST:

Routine noncontrast CT head without priors for comparison.

Mild atrophy.
Small vessel chronic ischemic changes of deep cerebral white matter.
Vague area of low attenuation lateral aspect left thalamus, cannot exclude early
infarct.
No evidence of cortical infarction or intracranial hemorrhage.
Posterior fossa unremarkable.
Sinuses clear and no bony abnormality seen.
IMPRESSION: Mild atrophy with small vessel chronic ischemic changes of deep cerebral white
matter.
Question lacunar infarct left thalamus.
No other intracranial abnormalities.

## 2010-11-09 ENCOUNTER — Ambulatory Visit (HOSPITAL_COMMUNITY)
Admission: RE | Admit: 2010-11-09 | Discharge: 2010-11-09 | Disposition: A | Discharge status: Home / Self Care | Payer: BC Managed Care – PPO | Source: Ambulatory Visit | Attending: Family Medicine | Admitting: Family Medicine

## 2010-11-09 ENCOUNTER — Other Ambulatory Visit: Payer: Self-pay | Admitting: Family Medicine

## 2010-11-09 DIAGNOSIS — M545 Low back pain, unspecified: Secondary | ICD-10-CM | POA: Insufficient documentation

## 2010-11-09 DIAGNOSIS — M5430 Sciatica, unspecified side: Secondary | ICD-10-CM

## 2011-06-06 IMAGING — CR DG LUMBAR SPINE COMPLETE 4+V
5 series · 5 of 5 positions shown · non-contrast
Comparison: None.

CLINICAL DATA: Low back pain.  Left-sided pain.  No injury.

LUMBAR SPINE - COMPLETE 4+ VIEW

[view not recorded (1 of 5)]
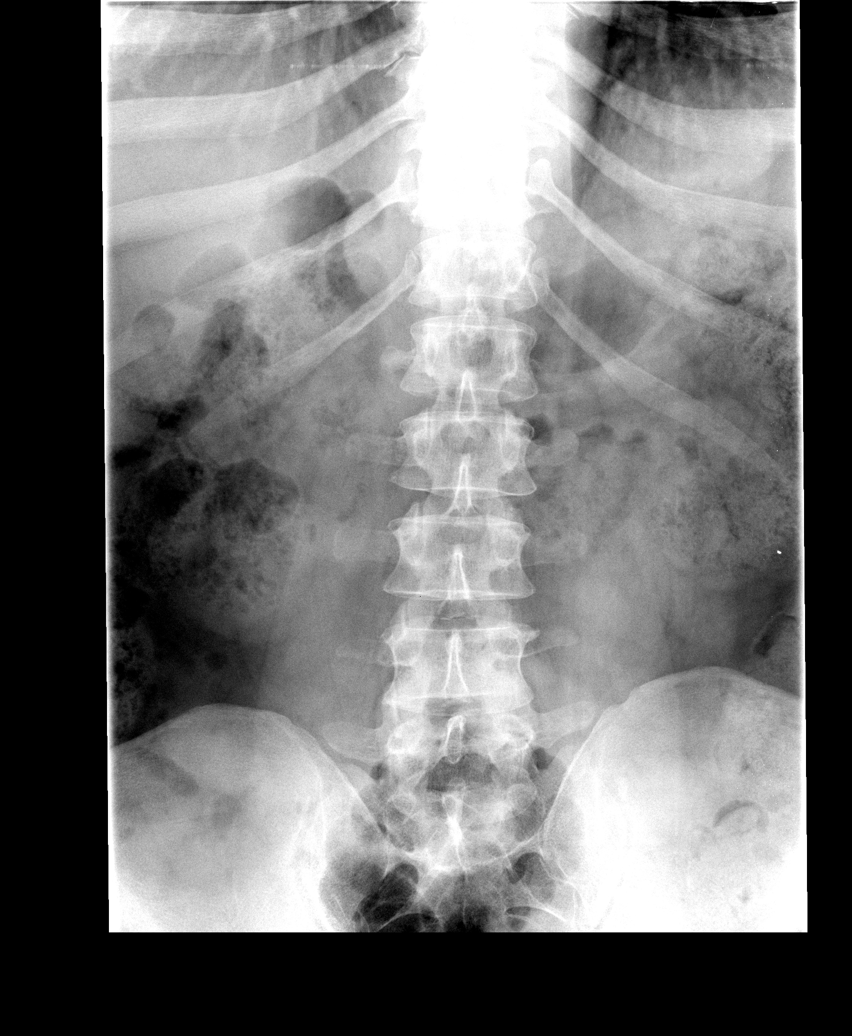

[view not recorded (2 of 5)]
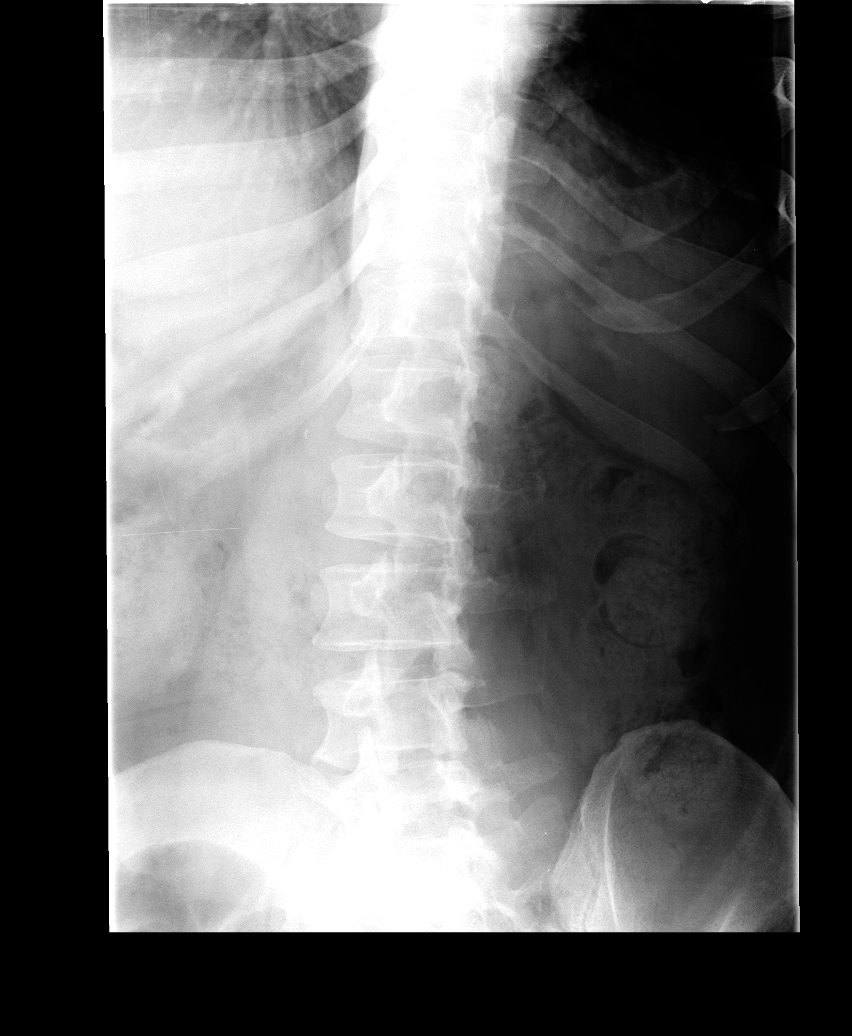

[view not recorded (3 of 5)]
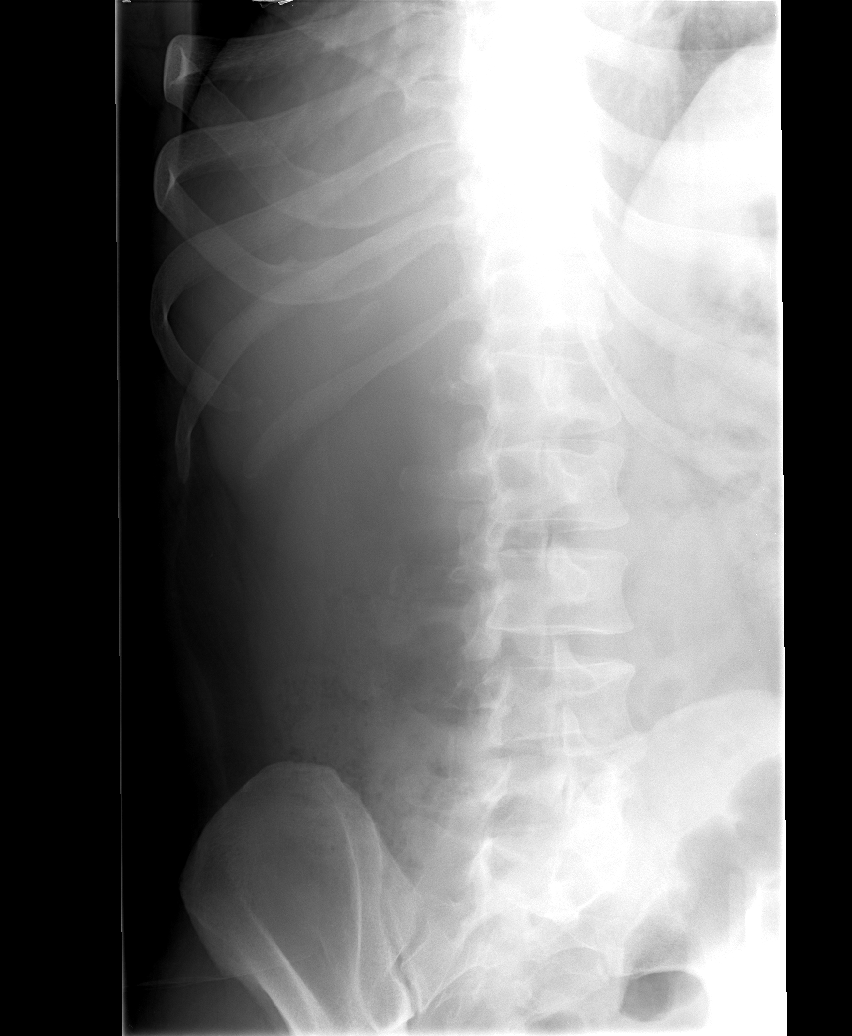

[view not recorded (4 of 5)]
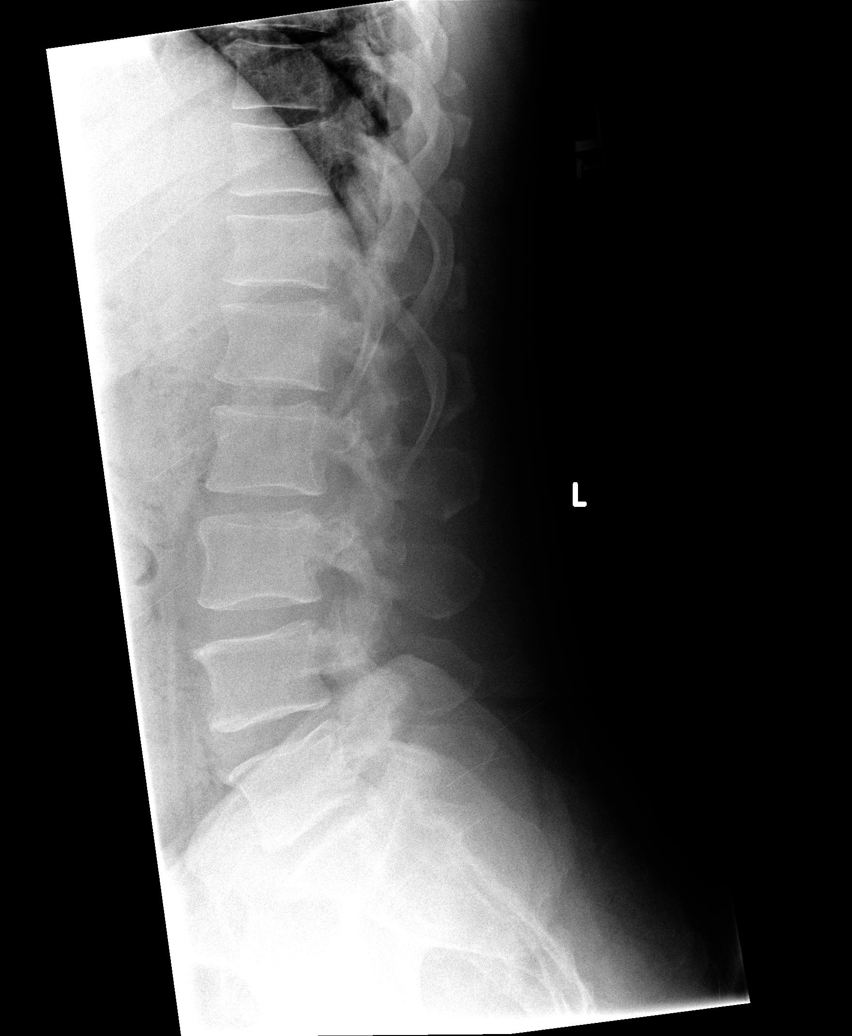

[view not recorded (5 of 5)]
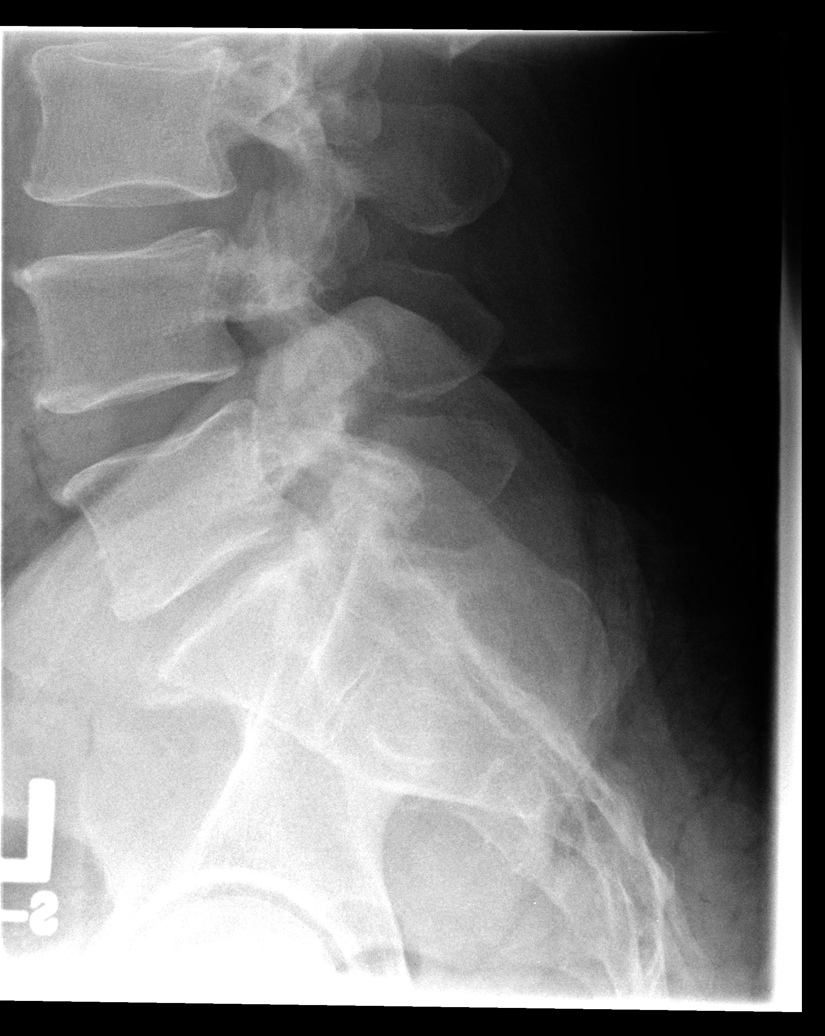

[5 of 5 positions shown; findings below may reference images not displayed]

FINDINGS: Mild L5-S1 disc space narrowing.  Minimal Schmorl's node
deformities.  Minimal retrolisthesis L2 and L3.
IMPRESSION: L5-S1 mild disc space narrowing.

## 2011-09-06 ENCOUNTER — Ambulatory Visit (HOSPITAL_COMMUNITY)
Admission: RE | Admit: 2011-09-06 | Discharge: 2011-09-06 | Disposition: A | Discharge status: Home / Self Care | Payer: BC Managed Care – PPO | Source: Ambulatory Visit | Attending: Family Medicine | Admitting: Family Medicine

## 2011-09-06 ENCOUNTER — Other Ambulatory Visit: Payer: Self-pay | Admitting: Family Medicine

## 2011-09-06 DIAGNOSIS — M542 Cervicalgia: Secondary | ICD-10-CM

## 2011-09-06 DIAGNOSIS — Z981 Arthrodesis status: Secondary | ICD-10-CM | POA: Insufficient documentation

## 2011-09-06 IMAGING — CR DG CERVICAL SPINE COMPLETE 4+V
6 series · 6 of 6 positions shown · non-contrast
Comparison: [DATE] [DATE]

CLINICAL DATA: Posterior neck pain for 3 weeks, history of neck
surgery 10 years ago

CERVICAL SPINE - COMPLETE 4+ VIEW

[view not recorded (1 of 6)]
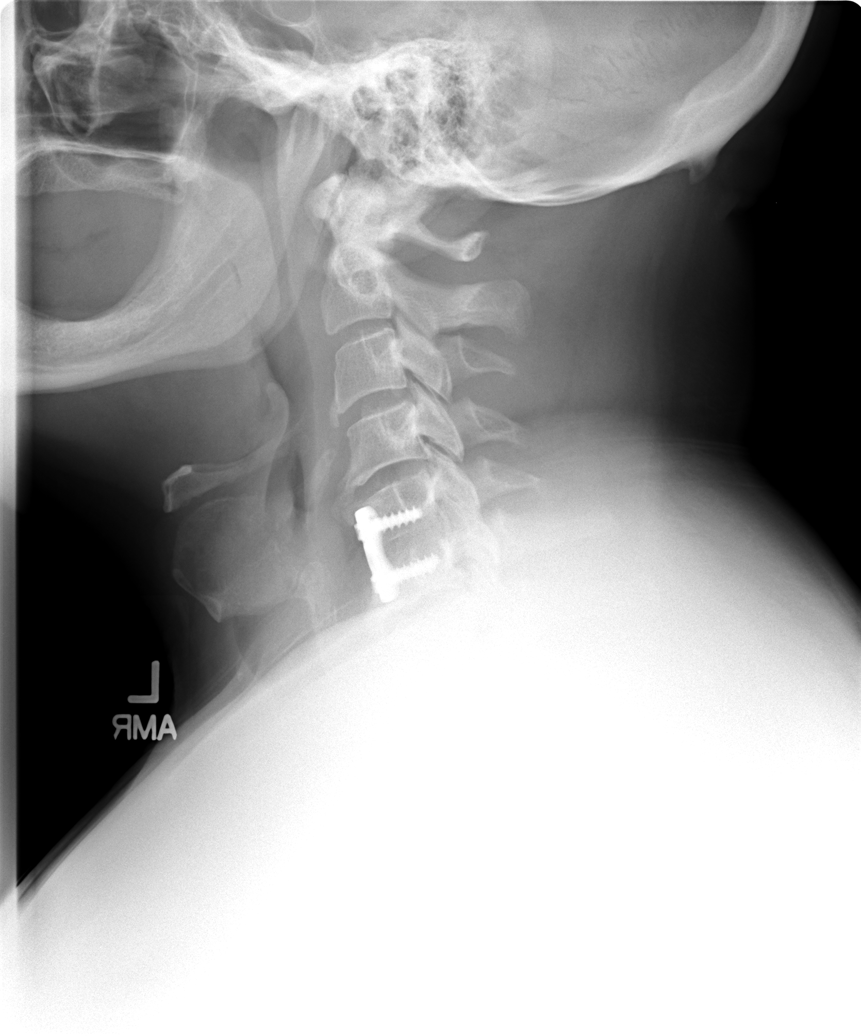

[view not recorded (2 of 6)]
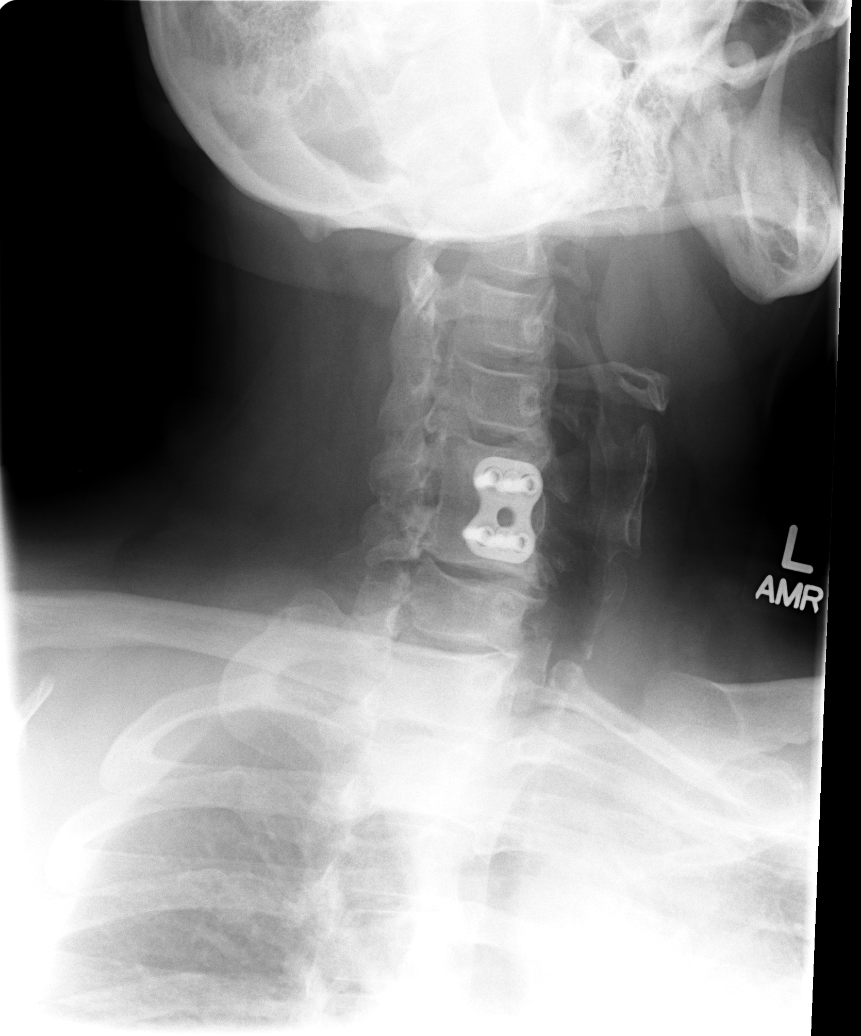

[view not recorded (3 of 6)]
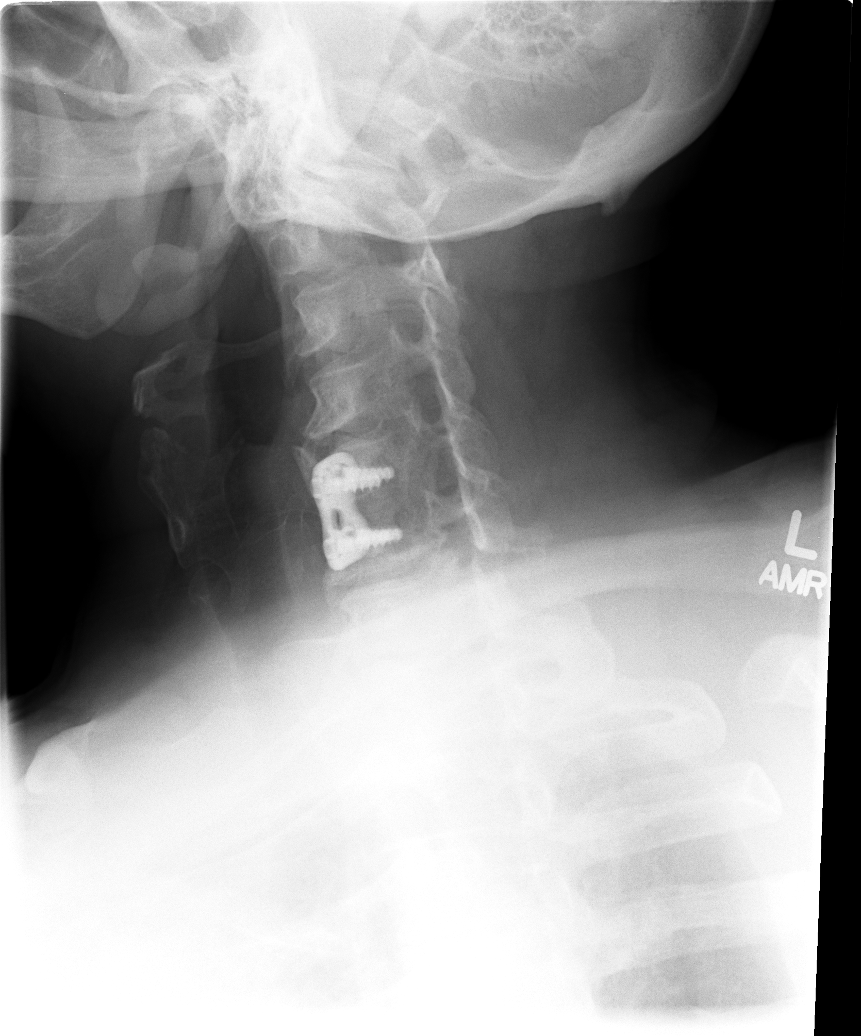

[view not recorded (4 of 6)]
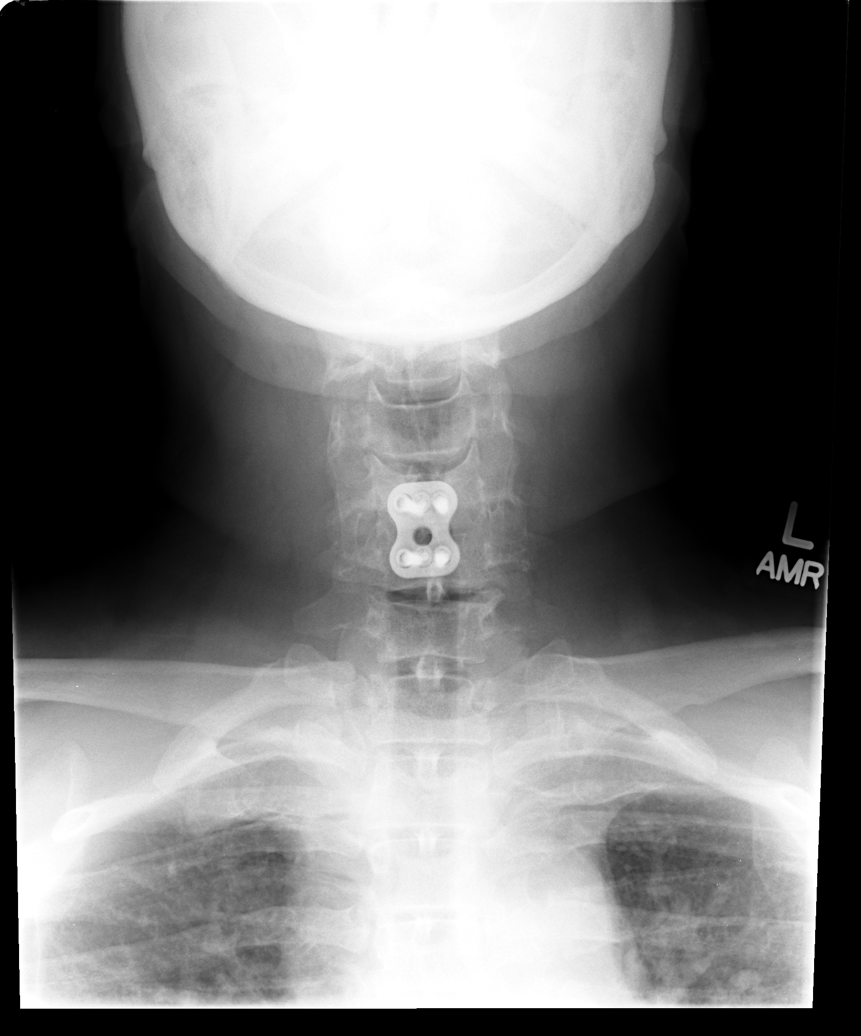

[view not recorded (5 of 6)]
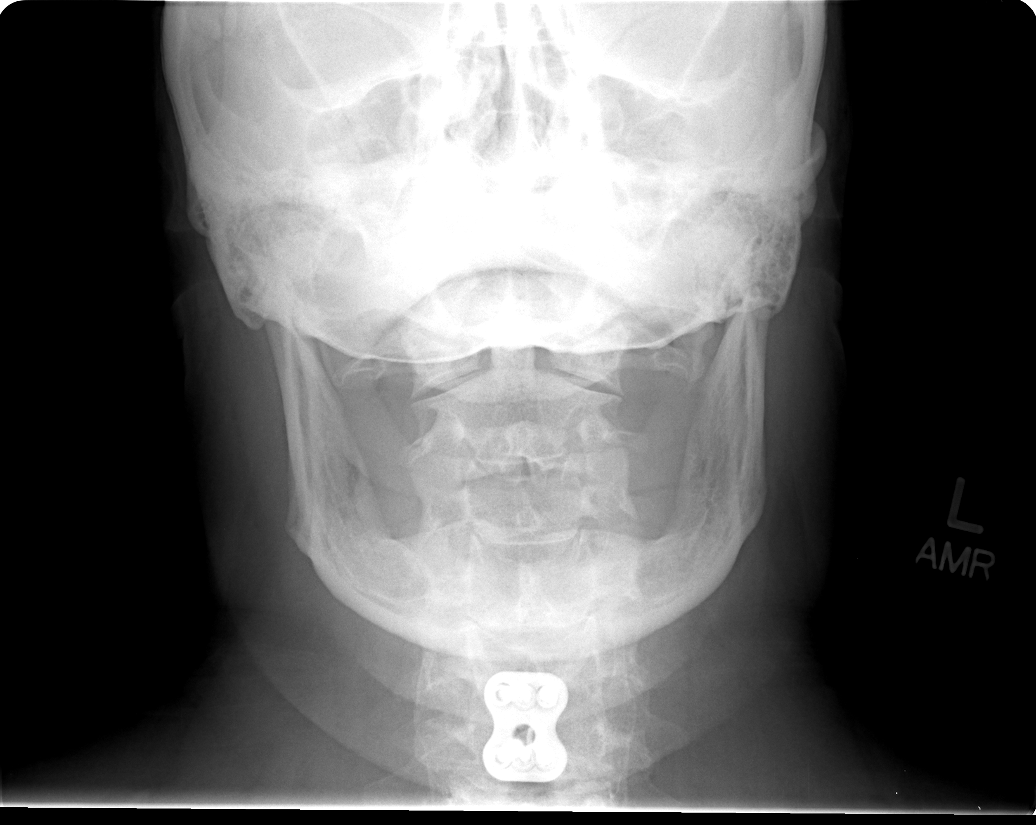

[view not recorded (6 of 6)]
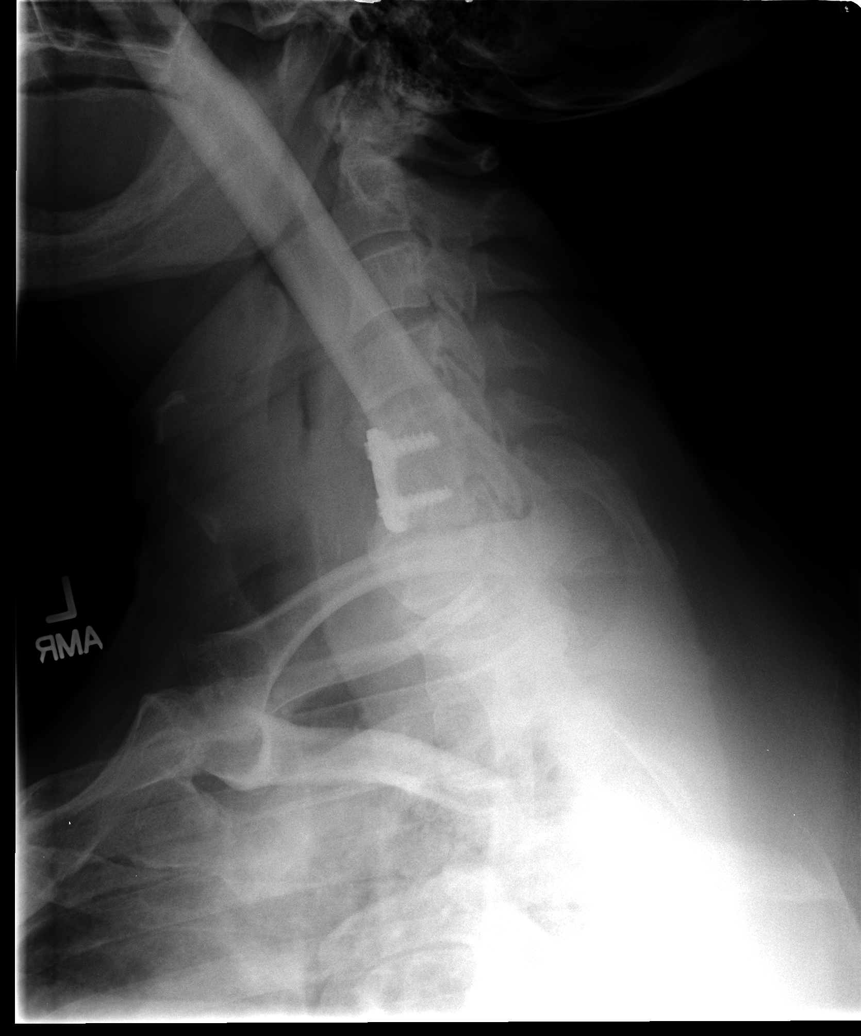

[6 of 6 positions shown; findings below may reference images not displayed]

FINDINGS: Prior anterior fusion of C5-C6.
Scattered end plate spur formation and mild disc space narrowing
throughout remainder of cervical spine.
No acute fracture, subluxation or bone destruction.
Prevertebral soft tissues grossly normal thickness.
Right foramina incompletely profiled.
Slight encroachment upon left C6-C7 neural foramen by uncovertebral
spurs.
Visualized lung apices clear.
C1-C2 alignment normal.
IMPRESSION: Prior C5-C6 fusion.
Scattered degenerative changes as above.
No acute abnormalities.

## 2011-12-13 ENCOUNTER — Ambulatory Visit (HOSPITAL_COMMUNITY)
Admission: RE | Admit: 2011-12-13 | Discharge: 2011-12-13 | Disposition: A | Discharge status: Home / Self Care | Payer: BC Managed Care – PPO | Source: Ambulatory Visit | Attending: Cardiology | Admitting: Cardiology

## 2011-12-13 DIAGNOSIS — I517 Cardiomegaly: Secondary | ICD-10-CM

## 2011-12-13 DIAGNOSIS — R079 Chest pain, unspecified: Secondary | ICD-10-CM | POA: Insufficient documentation

## 2011-12-13 DIAGNOSIS — G459 Transient cerebral ischemic attack, unspecified: Secondary | ICD-10-CM | POA: Insufficient documentation

## 2011-12-13 DIAGNOSIS — I10 Essential (primary) hypertension: Secondary | ICD-10-CM | POA: Insufficient documentation

## 2011-12-13 DIAGNOSIS — R9431 Abnormal electrocardiogram [ECG] [EKG]: Secondary | ICD-10-CM | POA: Insufficient documentation

## 2011-12-13 DIAGNOSIS — E785 Hyperlipidemia, unspecified: Secondary | ICD-10-CM | POA: Insufficient documentation

## 2011-12-13 NOTE — Progress Notes (Signed)
*  PRELIMINARY RESULTS* Echocardiogram 2D Echocardiogram has been performed.  Nestor Ramp M 12/13/2011, 3:16 PM

## 2011-12-14 ENCOUNTER — Encounter (HOSPITAL_COMMUNITY): Payer: Self-pay

## 2011-12-14 ENCOUNTER — Emergency Department (HOSPITAL_COMMUNITY)
Admission: EM | Admit: 2011-12-14 | Discharge: 2011-12-14 | Disposition: A | Discharge status: Home / Self Care | Payer: BC Managed Care – PPO | Attending: Emergency Medicine | Admitting: Emergency Medicine

## 2011-12-14 DIAGNOSIS — E119 Type 2 diabetes mellitus without complications: Secondary | ICD-10-CM | POA: Insufficient documentation

## 2011-12-14 DIAGNOSIS — I10 Essential (primary) hypertension: Secondary | ICD-10-CM | POA: Insufficient documentation

## 2011-12-14 HISTORY — DX: Essential (primary) hypertension: I10

## 2011-12-14 NOTE — ED Notes (Signed)
edp aware of d/c bp. Advised pt to call dr Gerda Diss for f/u first thing in am. And advised to come back if experiences cp or stroke sx's. Pt agreed. nad

## 2011-12-14 NOTE — ED Notes (Signed)
Pt denies pain/dizziness or any sx's upon d/c

## 2011-12-14 NOTE — ED Notes (Signed)
Pt reports hypertension for the past week.  Pt reports seeing his pcp and had his medication dose increased.  Pt reports going to [place] yesterday and having an Echocardiogram performed.  Pt reports that his bp is still elevated today and was told to come here by his pcp.

## 2011-12-14 NOTE — ED Provider Notes (Signed)
History  This chart was scribed for Joya Gaskins, MD by Albertha Ghee Rifaie. This patient was seen in room APA01/APA01 and the patient's care was started at 4:17 PM  CSN: [id number]  Arrival date & time 12/14/11  1527   None     Chief Complaint  Patient presents with  . Hypertension    The history is provided by the patient. No language interpreter was used.    Shawn [name] is a 56 y.o. male who presents to the Emergency Department complaining of 2 weeks of hypertension associated with light HA. Pt reports his BP has been elevating and was told by his PCP to come to the ED. He denies having fever, CP, SOB, LOC, abd pain, general body fatigue, and extremities swelling or pain. 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 Pt has been following up with PCP and reports that his medication dose increased. He also reports going to [place] yesterday and having an Echocardiogram performed. Pt denies smoking and he quit alcohol use about two months ago.      Past Medical History  Diagnosis Date  . Hypertension   . Diabetes mellitus without complication     Past Surgical History  Procedure Date  . Neck surgery     No family history on file.  History  Substance Use Topics  . Smoking status: Never Smoker   . Smokeless tobacco: Not on file  . Alcohol Use: Yes      Review of Systems  All other systems reviewed and are negative.    Allergies  Review of patient's allergies indicates no known allergies.  Home Medications  No current outpatient prescriptions on file.  BP 180/121  Pulse 110  Temp 98.4 F (36.9 C) (Oral)  Resp 18  SpO2 97%  Physical Exam  CONSTITUTIONAL: Well developed/well nourished HEAD AND FACE: Normocephalic/atraumatic EYES: EOMI/PERRL, no nystagmus ENMT: Mucous membranes moist NECK: supple no meningeal signs, no bruits SPINE:entire spine nontender CV: S1/S2 noted, no murmurs/rubs/gallops  noted LUNGS: Lungs are clear to auscultation bilaterally, no apparent distress ABDOMEN: soft, nontender, no rebound or guarding GU:no cva tenderness NEURO:Awake/alert, facies symmetric, no arm or leg drift is noted Cranial nerves 3/4/5/6/09/12/08/11/12 tested and intact Gait normal No past pointing EXTREMITIES: pulses normal, full ROM SKIN: warm, color normal PSYCH:anxious    ED Course  Procedures  DIAGNOSTIC STUDIES: Oxygen Saturation is 97% on room air, adequate by my interpretation.    COORDINATION OF CARE: 4:20 PM Discussed treatment plan with pt at bedside and pt agreed to plan. 4:30 PM Discharge plan was discussed with pt and pt agreed to plan.   D/w dr Gerda Diss.  He was sent to ED as patient told him he had elevated BP (>200/100) and felt best served in ED Pt without cp/sob/weakness.  No severe headache.  No change in mental status.  He is in no distress.  He reports he has been med compliant. Last ETOH drink a month ago.  He is taking his anxiety meds as prescribed.  I doubt hypertensive emergency and feel best course is for him to slowly lower BP with outpatient meds.  Pt is agreeable at this time. He denies change in vision or change in speech, he reports he feels anxious   MDM  Nursing notes including past medical history and social history reviewed and considered in documentation Previous records reviewed and considered - echo results reviewed       I  personally performed the services described in this documentation, which was scribed in my presence. The recorded information has been reviewed and considered.      Joya Gaskins, MD 12/14/11 [phone]

## 2011-12-20 ENCOUNTER — Other Ambulatory Visit: Payer: Self-pay | Admitting: Family Medicine

## 2011-12-20 DIAGNOSIS — I10 Essential (primary) hypertension: Secondary | ICD-10-CM

## 2011-12-21 ENCOUNTER — Ambulatory Visit (HOSPITAL_COMMUNITY)
Admission: RE | Admit: 2011-12-21 | Discharge: 2011-12-21 | Disposition: A | Discharge status: Home / Self Care | Payer: BC Managed Care – PPO | Source: Ambulatory Visit | Attending: Family Medicine | Admitting: Family Medicine

## 2011-12-21 DIAGNOSIS — E119 Type 2 diabetes mellitus without complications: Secondary | ICD-10-CM | POA: Insufficient documentation

## 2011-12-21 DIAGNOSIS — I10 Essential (primary) hypertension: Secondary | ICD-10-CM | POA: Insufficient documentation

## 2011-12-21 DIAGNOSIS — N400 Enlarged prostate without lower urinary tract symptoms: Secondary | ICD-10-CM | POA: Insufficient documentation

## 2011-12-21 IMAGING — US US RENAL
1 series · 14 of 25 positions shown · non-contrast
Comparison: None

CLINICAL DATA: Hypertension, diabetes

RENAL/URINARY TRACT ULTRASOUND COMPLETE

[Series 1: us renal · 0.27mm/px · 14 of 56 slices shown]
[im 1/56]
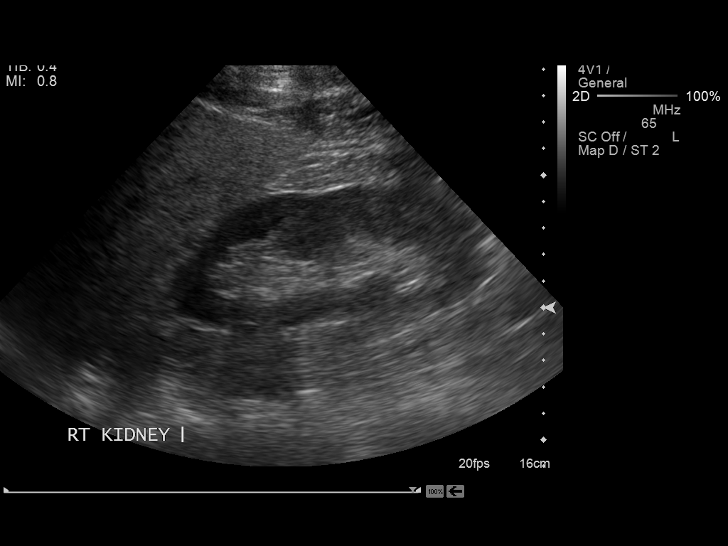
[im 5/56]
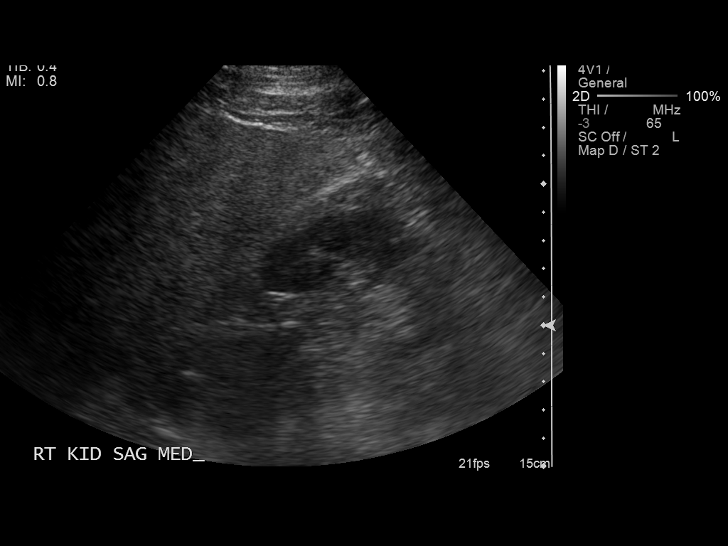
[im 10/56]
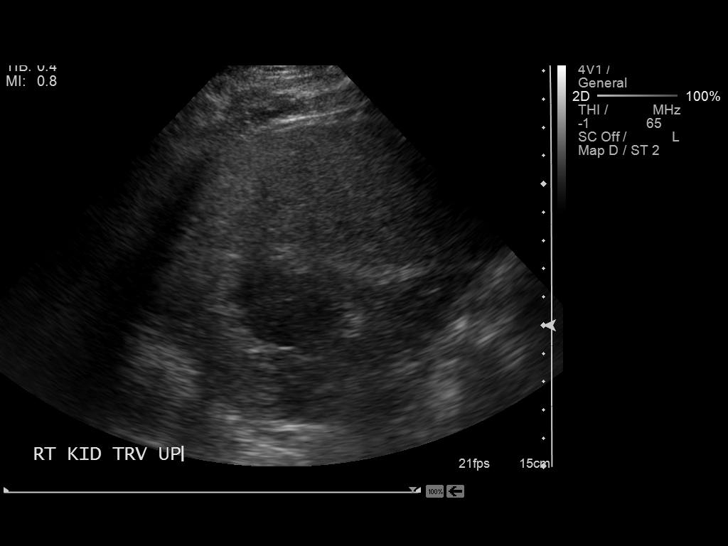
[im 14/56]
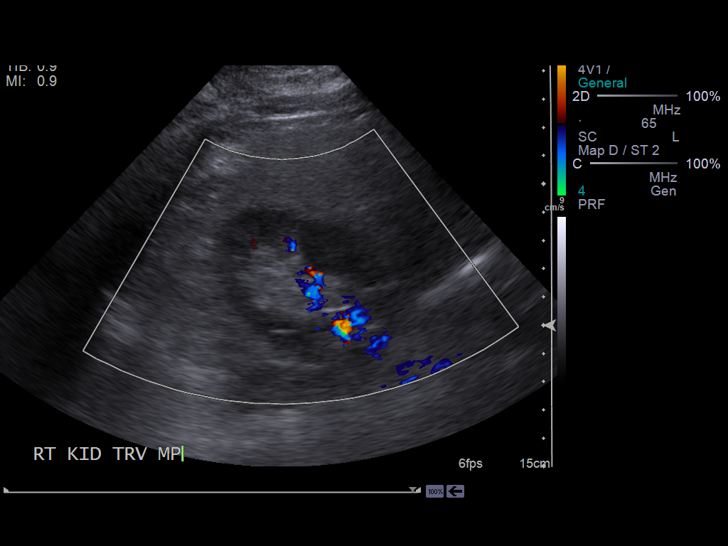
[im 19/56]
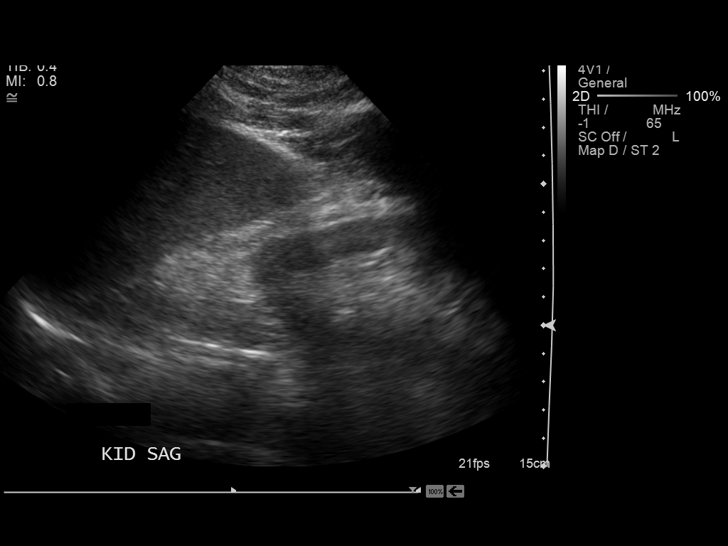
[im 21/56]
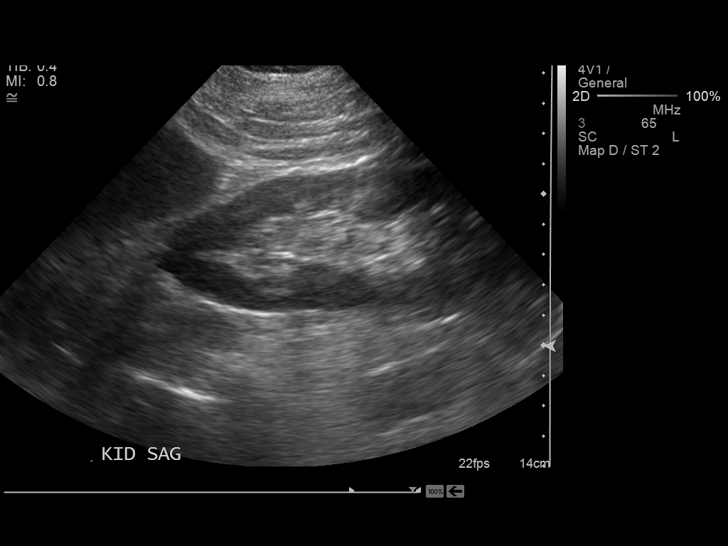
[im 26/56]
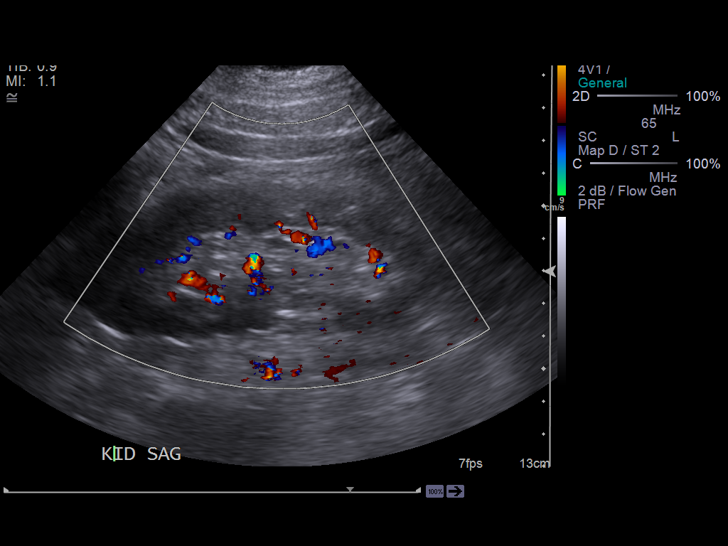
[im 30/56]
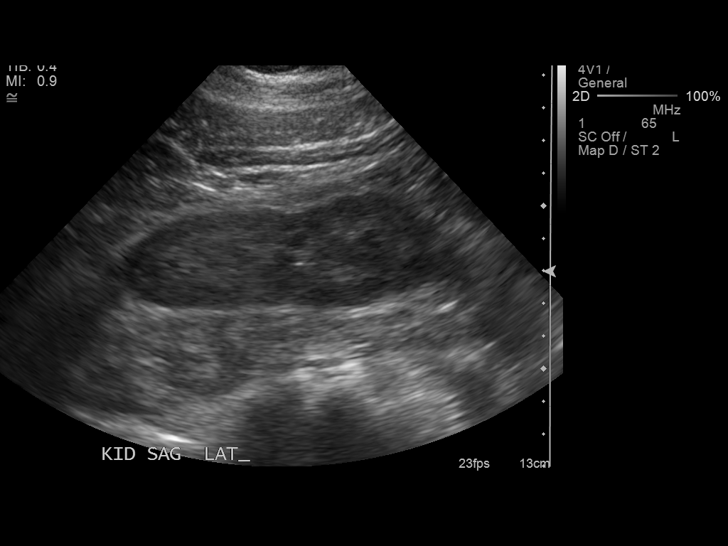
[im 35/56]
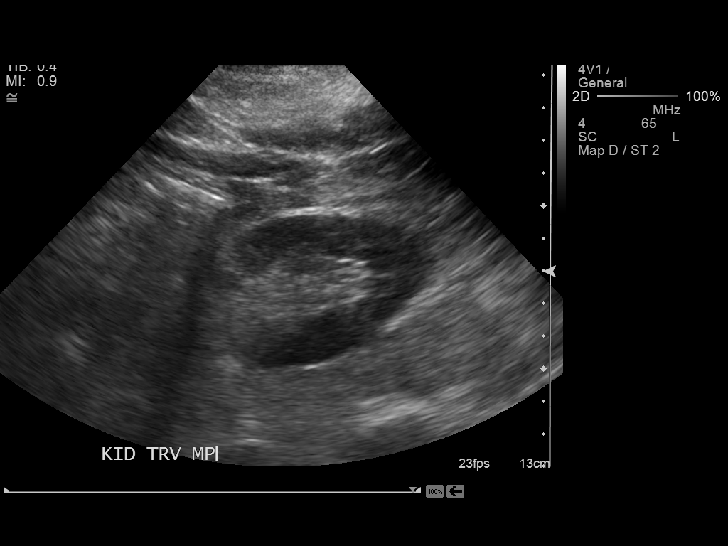
[im 37/56]
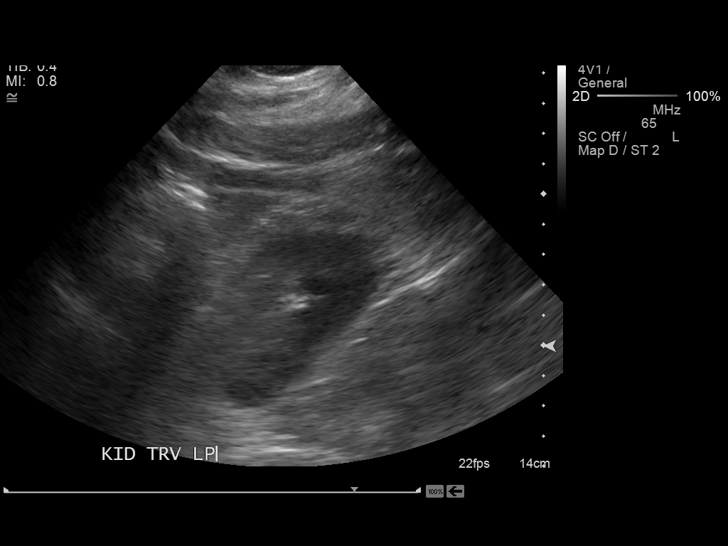
[im 42/56]
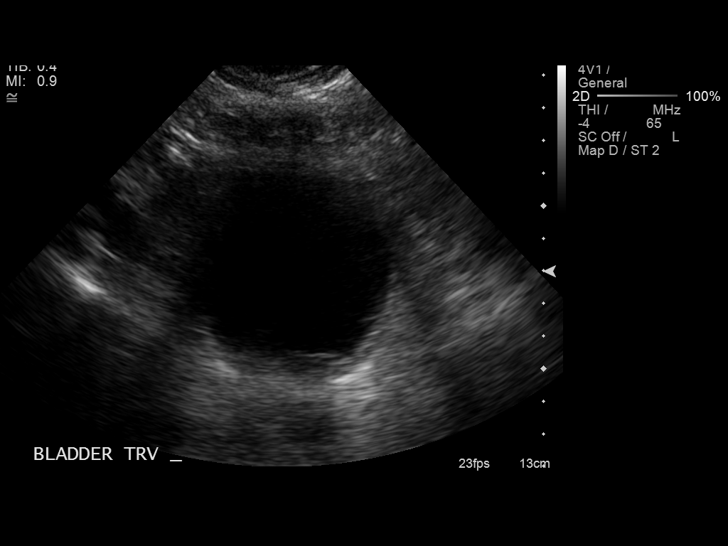
[im 46/56]
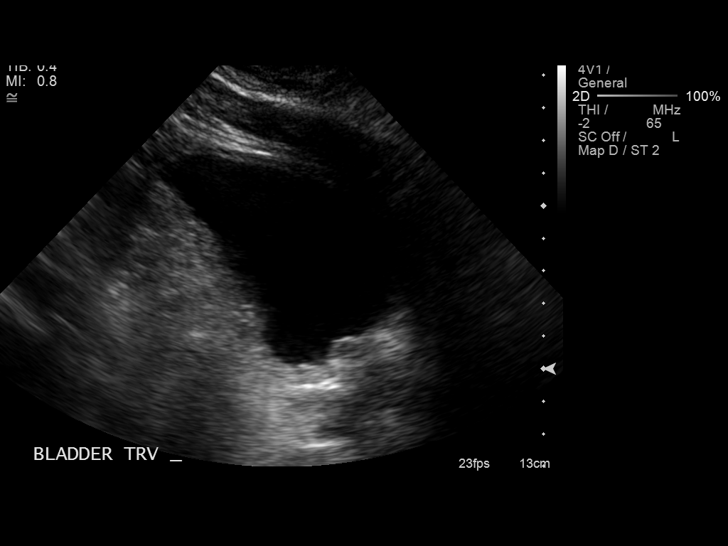
[im 51/56]
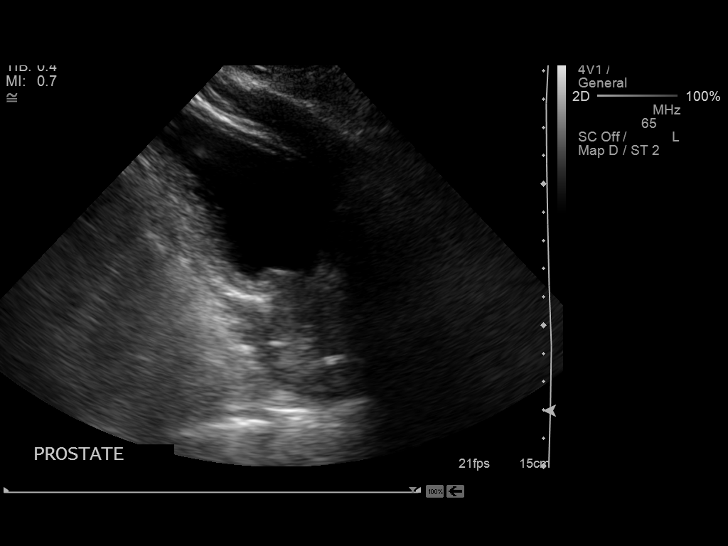
[im 56/56]
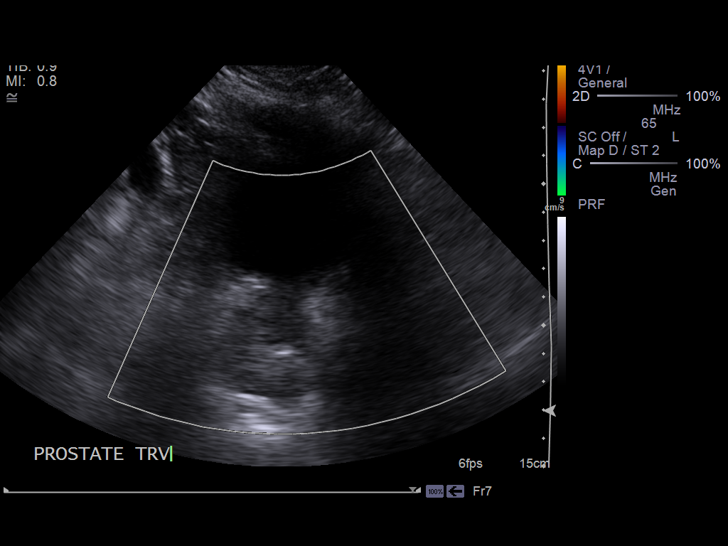

[14 of 25 positions shown; findings below may reference images not displayed]

FINDINGS: Right Kidney:  11.9 cm length. Mild cortical thinning.  No mass,
hydronephrosis or shadowing calcification.  No perinephric fluid.

Left Kidney:  13.5 cm length.  Minimal cortical thinning.  No mass,
hydronephrosis or shadowing calcification.  No perinephric fluid.

Bladder:  Incompletely distended, grossly normal appearance.
Ureteral jets were not visualized.  Prostate gland appears
prominent in size at 3.6 x 3.8 x 4.7 cm.

Incidentally noted increased echogenicity of the liver, likely
fatty infiltration though this could be seen with cirrhosis and
certain infiltrative disorders.
IMPRESSION: Mild prostatic enlargement.
No acute renal sonographic abnormalities.
Question fatty infiltration of liver as above.

## 2012-01-03 ENCOUNTER — Ambulatory Visit: Payer: BC Managed Care – PPO | Admitting: Cardiology

## 2012-01-06 HISTORY — PX: CARDIAC CATHETERIZATION: SHX172

## 2012-01-12 ENCOUNTER — Encounter: Payer: Self-pay | Admitting: *Deleted

## 2012-01-12 ENCOUNTER — Encounter: Payer: Self-pay | Admitting: Cardiology

## 2012-01-13 ENCOUNTER — Ambulatory Visit (INDEPENDENT_AMBULATORY_CARE_PROVIDER_SITE_OTHER): Payer: BC Managed Care – PPO | Admitting: Cardiology

## 2012-01-13 ENCOUNTER — Encounter: Payer: Self-pay | Admitting: *Deleted

## 2012-01-13 ENCOUNTER — Other Ambulatory Visit: Payer: Self-pay | Admitting: Cardiology

## 2012-01-13 ENCOUNTER — Encounter: Payer: Self-pay | Admitting: Cardiology

## 2012-01-13 ENCOUNTER — Ambulatory Visit (HOSPITAL_COMMUNITY)
Admission: RE | Admit: 2012-01-13 | Discharge: 2012-01-13 | Disposition: A | Discharge status: Home / Self Care | Payer: BC Managed Care – PPO | Source: Ambulatory Visit | Attending: Cardiology | Admitting: Cardiology

## 2012-01-13 VITALS — BP 140/100 | HR 96 | Ht 65.0 in | Wt 231.4 lb

## 2012-01-13 DIAGNOSIS — R9431 Abnormal electrocardiogram [ECG] [EKG]: Secondary | ICD-10-CM

## 2012-01-13 DIAGNOSIS — N1832 Type 2 diabetes mellitus with diabetic chronic kidney disease: Secondary | ICD-10-CM | POA: Insufficient documentation

## 2012-01-13 DIAGNOSIS — I10 Essential (primary) hypertension: Secondary | ICD-10-CM | POA: Insufficient documentation

## 2012-01-13 DIAGNOSIS — R0602 Shortness of breath: Secondary | ICD-10-CM

## 2012-01-13 DIAGNOSIS — E1122 Type 2 diabetes mellitus with diabetic chronic kidney disease: Secondary | ICD-10-CM | POA: Insufficient documentation

## 2012-01-13 DIAGNOSIS — E119 Type 2 diabetes mellitus without complications: Secondary | ICD-10-CM

## 2012-01-13 LAB — CBC
MCH: 30 pg (ref 26.0–34.0)
Platelets: 151 10*3/uL (ref 150–400)
RBC: 5.54 MIL/uL (ref 4.22–5.81)
WBC: 7.2 10*3/uL (ref 4.0–10.5)

## 2012-01-13 IMAGING — CR DG CHEST 2V
2 series · 2 of 2 positions shown · non-contrast
Comparison: [DATE]

CLINICAL DATA: Short of breath.  Coughing.

CHEST - 2 VIEW

[view not recorded (1 of 2)]
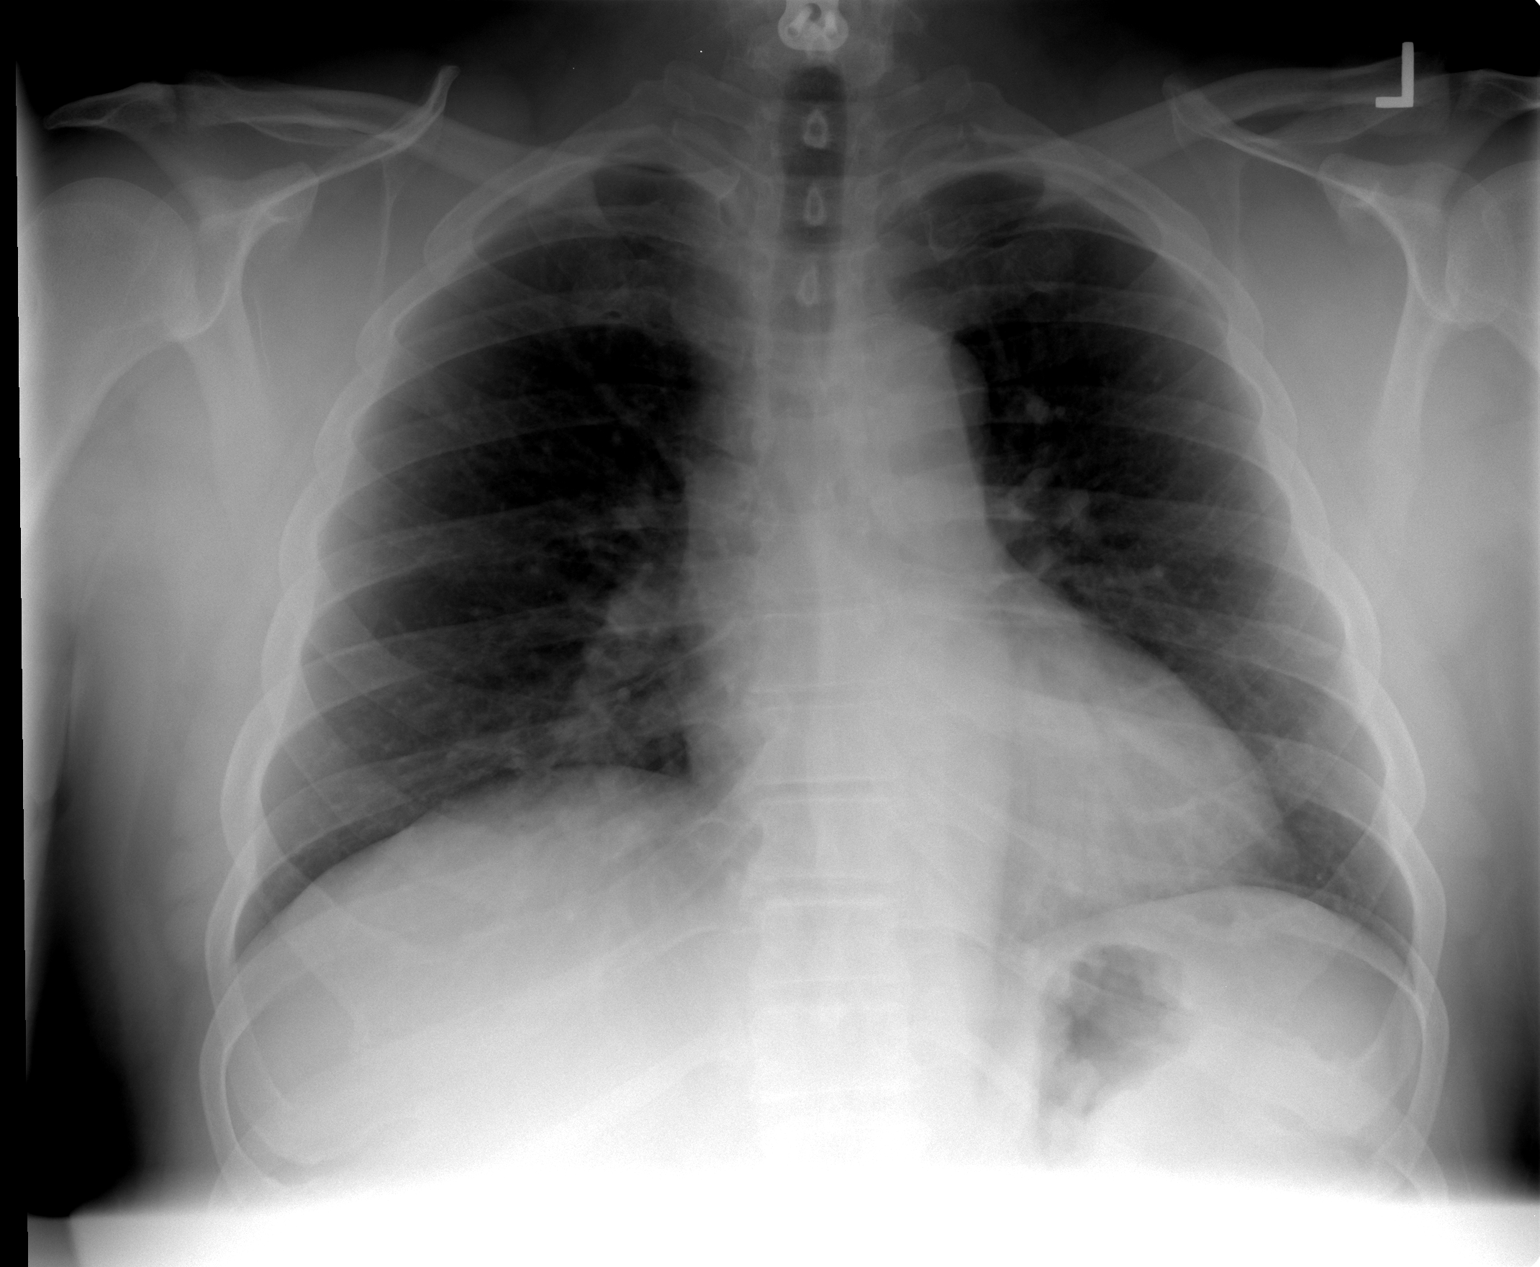

[view not recorded (2 of 2)]
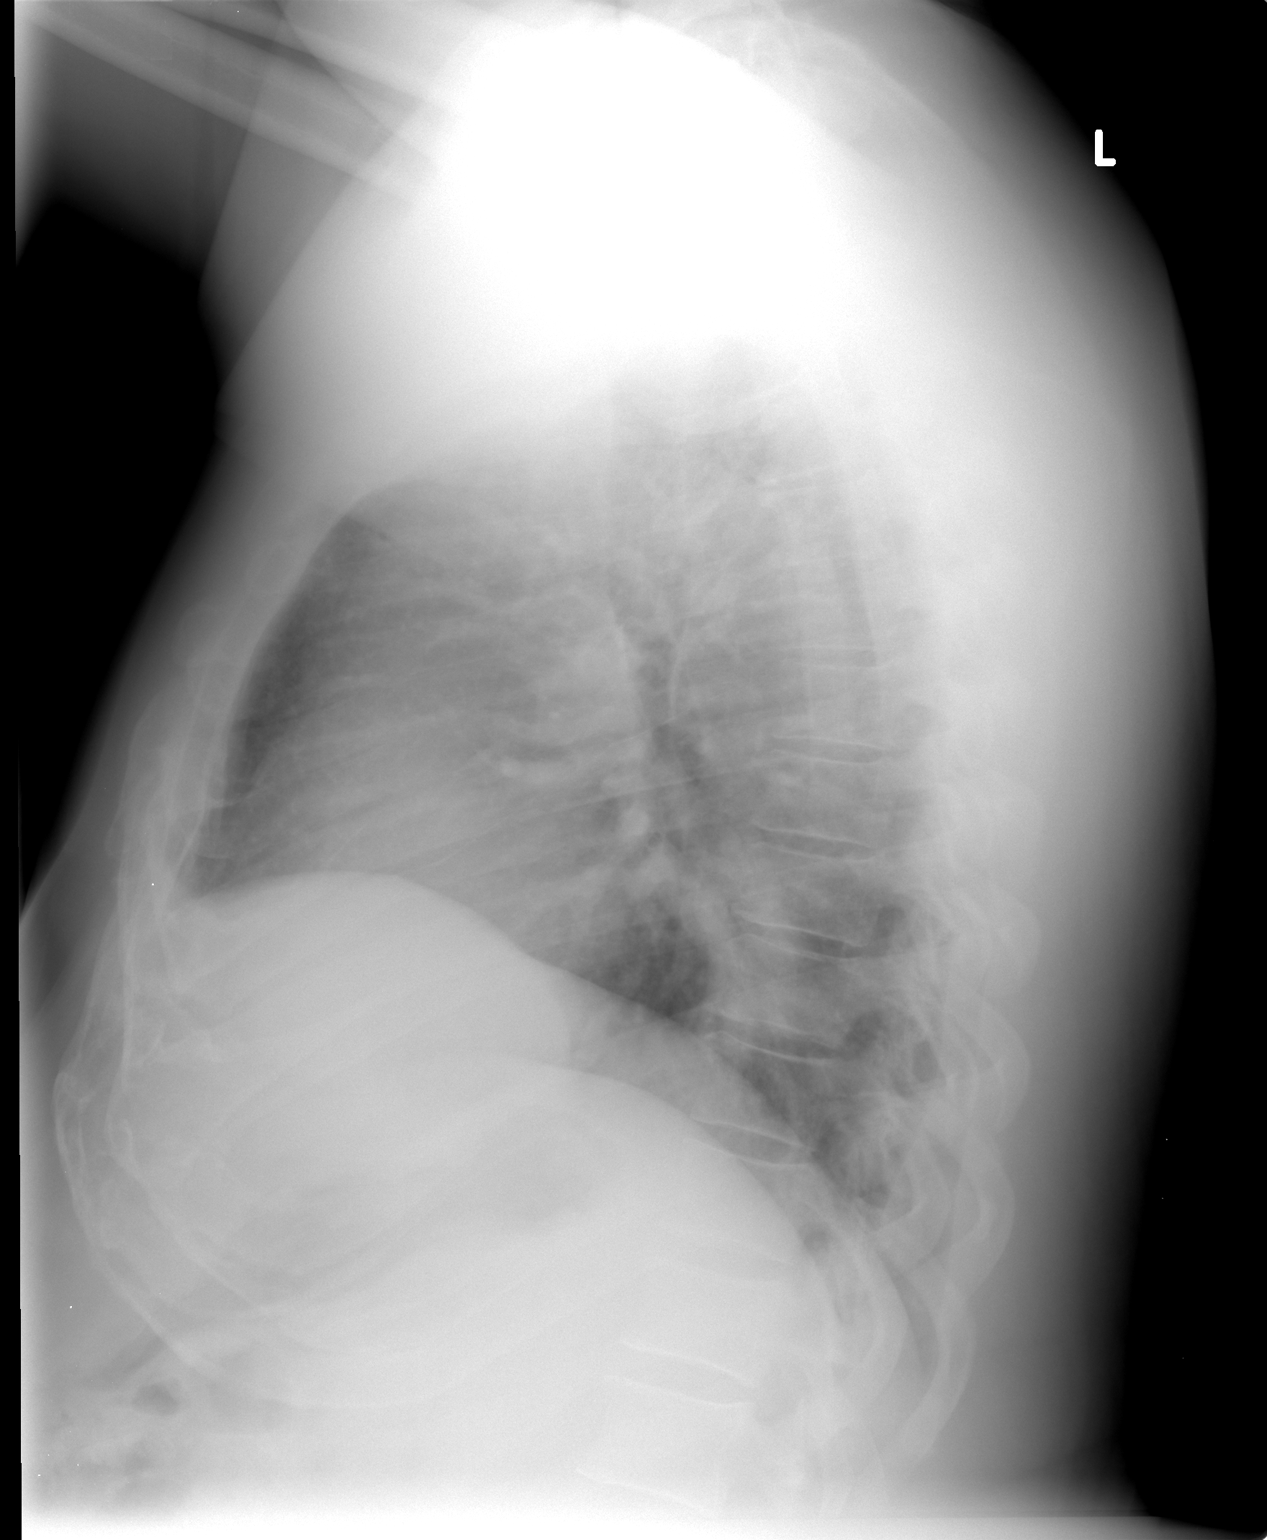

[2 of 2 positions shown; findings below may reference images not displayed]

FINDINGS: The heart, mediastinum and hila are within normal limits.

The lungs are clear.

No pleural effusion or pneumothorax.

The bony thorax is intact.  There is been a previous anterior
cervical spine fusion.
IMPRESSION: No acute cardiopulmonary disease.

## 2012-01-13 MED ORDER — METOPROLOL SUCCINATE ER 50 MG PO TB24: 50.0000 mg | ORAL_TABLET | Freq: Every day | ORAL | Status: DC

## 2012-01-13 NOTE — Patient Instructions (Signed)
Your physician recommends that you schedule a follow-up appointment in: Following procedure  Your physician recommends that you return for lab work in: Today   Your physician has recommended you make the following change in your medication:   1 START Toprol XL 50 mg one tablet daily  A chest x-ray takes a picture of the organs and structures inside the chest, including the heart, lungs, and blood vessels. This test can show several things, including, whether the heart is enlarges; whether fluid is building up in the lungs; and whether pacemaker / defibrillator leads are still in place.

## 2012-01-13 NOTE — Assessment & Plan Note (Addendum)
Significantly abnormal tracing, actually suggestive of acute or recent anterior wall infarct, although not associated with active chest pain or wall motion abnormalities by echocardiogram. Troponin levels were also normal at that time. Pattern is not typical of Brugada. Question whether this is a severe repolarization abnormality with associated LVH, although still not typical for this either. In light of his progressive shortness of breath, severe hypertension, and markedly abnormal ECG, we have discussed cardiac catheterization to most clearly evaluate for the presence of significant obstructive underlying CAD and help to guide further therapy. Would also strongly consider distal aortography or selective renal angiography to screen for the presence of renal artery stenosis. Procedure is being scheduled with Dr. Excell Seltzer on Monday.

## 2012-01-13 NOTE — Assessment & Plan Note (Signed)
Being managed with diet measures now by Dr. Gerda Diss.

## 2012-01-13 NOTE — Assessment & Plan Note (Signed)
Could be a function of severe hypertension with hypertensive heart disease, also need to rule out obstructive CAD. See above discussion.

## 2012-01-13 NOTE — Progress Notes (Signed)
Clinical Summary Shawn [name] is a 56 y.o.male referred for cardiology consultation by Dr Gerda Diss. Records were reviewed. He has had accelerated hypertension, difficult to control over the last 5 weeks, and has had several medication adjustments recently. He also had a significantly abnormal screening ECG suggestive of acute or recent anterior wall infarct, however this was in the absence of any chest pain, and cardiac markers obtained by Dr.Luking were reassuring with normal troponin I levels.  He was referred for an echocardiogram on 10/8 that demonstrated no focal wall motion abnormality, vigorous LVEF of 75-80%, severe left ventricular hypertrophy consistent with hypertensive heart disease, grade 1 diastolic dysfunction, mild left atrial enlargement, trivial valvular regurgitation, no pericardial effusion.  Recent lab work reviewed finding hemoglobin 17.0, platelets 180, potassium 3.6, BUN 8, creatinine 1.0, total cholesterol 167, LDL 97, normal LFTs. Renal ultrasound demonstrated normal kidney size bilaterally with minimal to mild cortical thickening.  From a symptom perspective, he does report progressive dyspnea on exertion over the last few months, no definite exertional chest pain, but diaphoresis and a feeling of "shakiness." He has had no palpitations or syncope. 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 Dr.Luking has kept him out of work recently.   No Known Allergies  Current Outpatient Prescriptions  Medication Sig Dispense Refill  . amLODipine (NORVASC) 5 MG tablet Take 10 mg by mouth daily.       Marland Kitchen buPROPion (WELLBUTRIN XL) 300 MG 24 hr tablet Take 300 mg by mouth every morning.       . hydrochlorothiazide (HYDRODIURIL) 25 MG tablet Take 25 mg by mouth daily.       Marland Kitchen KLOR-CON M20 20 MEQ tablet Take 20 mEq by mouth 2 (two) times daily.       Marland Kitchen lisinopril (PRINIVIL,ZESTRIL) 40 MG tablet Take 40 mg by mouth daily.       . pantoprazole  (PROTONIX) 40 MG tablet Take 40 mg by mouth daily.       . pravastatin (PRAVACHOL) 80 MG tablet Take 80 mg by mouth daily.       Marland Kitchen venlafaxine XR (EFFEXOR-XR) 150 MG 24 hr capsule Take 150 mg by mouth 2 (two) times daily.       . metoprolol succinate (TOPROL-XL) 50 MG 24 hr tablet Take 1 tablet (50 mg total) by mouth daily. Take with or immediately following a meal.  30 tablet  6    Past Medical History  Diagnosis Date  . Hypertension     Greater than 20 years  . Type 2 diabetes mellitus   . Anxiety   . Attention deficit disorder   . Depression   . Obstructive sleep apnea   . TIA (transient ischemic attack)     Possible, 2007    Past Surgical History  Procedure Date  . Neck surgery   . Vasectomy     Family History  Problem Relation Age of Onset  . Colon cancer Mother   . Hypertension Mother   . CAD Mother     Social History Mr. Whipp reports that he has never smoked. He does not have any smokeless tobacco history on file. Mr. Dimalanta reports that he drinks alcohol.  Review of Systems No reported bleeding episodes. Stable appetite. No orthopnea or PND. Has had trouble with leg edema in the past although not recently. Otherwise negative.  Physical Examination Filed Vitals:   01/13/12 0832  BP: 140/100  Pulse: 96   Filed Weights  01/13/12 0832  Weight: 231 lb 6.4 oz (104.962 kg)   Patient anxious but in no acute distress. HEENT: Conjunctiva and lids normal, oropharynx clear with moist mucosa. Neck: Supple, no elevated JVP or carotid bruits, no thyromegaly. Lungs: Clear to auscultation, nonlabored breathing at rest. Cardiac: Regular rate and rhythm, no S3, 2/6 systolic murmur, no pericardial rub. Abdomen: Soft, nontender, bowel sounds present. No bruit. Extremities: No pitting edema, distal pulses 2+. Skin: Warm and dry. Musculoskeletal: No kyphosis. Neuropsychiatric: Alert and oriented x3, affect grossly appropriate.   Problem List and Plan   Accelerated  hypertension Blood pressure remains elevated. Medications were reviewed with the patient and his wife. He reports compliance, and they have been looking more at his diet in terms of sodium restriction. He reports a greater than 20 year history of hypertension, also family history. Basic screening labs show normal renal function and potassium. Dr. Gerda Diss indicates that he did send a plasma metanephrine level, although not available to review. He has evidence of probable hypertensive heart disease with severe LVH by echocardiogram, although possibility of hypertrophic nonobstructive cardiomyopathy is also to be considered. For now we will continue his present medication and add Toprol-XL 50 mg daily to provide heart rate control.  Abnormal ECG Significantly abnormal tracing, actually suggestive of acute or recent anterior wall infarct, although not associated with active chest pain or wall motion abnormalities by echocardiogram. Troponin levels were also normal at that time. Pattern is not typical of Brugada. Question whether this is a severe repolarization abnormality with associated LVH, although still not typical for this either. In light of his progressive shortness of breath, severe hypertension, and markedly abnormal ECG, we have discussed cardiac catheterization to most clearly evaluate for the presence of significant obstructive underlying CAD and help to guide further therapy. Would also strongly consider distal aortography or selective renal angiography to screen for the presence of renal artery stenosis. Procedure is being scheduled with Dr. Excell Seltzer on Monday.  Shortness of breath Could be a function of severe hypertension with hypertensive heart disease, also need to rule out obstructive CAD. See above discussion.  Type 2 diabetes mellitus Being managed with diet measures now by Dr. Gerda Diss.    Jonelle Sidle, M.D., F.A.C.C.

## 2012-01-13 NOTE — Assessment & Plan Note (Signed)
Blood pressure remains elevated. Medications were reviewed with the patient and his wife. He reports compliance, and they have been looking more at his diet in terms of sodium restriction. He reports a greater than 20 year history of hypertension, also family history. Basic screening labs show normal renal function and potassium. Dr. Gerda Diss indicates that he did send a plasma metanephrine level, although not available to review. He has evidence of probable hypertensive heart disease with severe LVH by echocardiogram, although possibility of hypertrophic nonobstructive cardiomyopathy is also to be considered. For now we will continue his present medication and add Toprol-XL 50 mg daily to provide heart rate control.

## 2012-01-14 LAB — BASIC METABOLIC PANEL
BUN: 9 mg/dL (ref 6–23)
CO2: 32 mEq/L (ref 19–32)
Calcium: 9.4 mg/dL (ref 8.4–10.5)
Chloride: 101 mEq/L (ref 96–112)
Creat: 1.29 mg/dL (ref 0.50–1.35)
Glucose, Bld: 70 mg/dL (ref 70–99)
Potassium: 3.7 mEq/L (ref 3.5–5.3)
Sodium: 144 mEq/L (ref 135–145)

## 2012-01-14 LAB — PROTIME-INR
INR: 1.16 (ref <1.50)
Prothrombin Time: 14.7 seconds (ref 11.6–15.2)

## 2012-01-14 LAB — APTT: aPTT: 36 seconds (ref 24–37)

## 2012-01-16 ENCOUNTER — Encounter (HOSPITAL_BASED_OUTPATIENT_CLINIC_OR_DEPARTMENT_OTHER)
Admission: RE | Disposition: A | Discharge status: Home / Self Care | Payer: Self-pay | Source: Ambulatory Visit | Attending: Cardiovascular Disease

## 2012-01-16 ENCOUNTER — Inpatient Hospital Stay (HOSPITAL_BASED_OUTPATIENT_CLINIC_OR_DEPARTMENT_OTHER)
Admission: RE | Admit: 2012-01-16 | Discharge: 2012-01-16 | Disposition: A | Discharge status: Home / Self Care | Payer: BC Managed Care – PPO | Source: Ambulatory Visit | Attending: Cardiovascular Disease | Admitting: Cardiovascular Disease

## 2012-01-16 DIAGNOSIS — I517 Cardiomegaly: Secondary | ICD-10-CM | POA: Insufficient documentation

## 2012-01-16 DIAGNOSIS — I739 Peripheral vascular disease, unspecified: Secondary | ICD-10-CM

## 2012-01-16 DIAGNOSIS — G4733 Obstructive sleep apnea (adult) (pediatric): Secondary | ICD-10-CM | POA: Insufficient documentation

## 2012-01-16 DIAGNOSIS — R9431 Abnormal electrocardiogram [ECG] [EKG]: Secondary | ICD-10-CM

## 2012-01-16 DIAGNOSIS — E119 Type 2 diabetes mellitus without complications: Secondary | ICD-10-CM | POA: Insufficient documentation

## 2012-01-16 DIAGNOSIS — I119 Hypertensive heart disease without heart failure: Secondary | ICD-10-CM | POA: Insufficient documentation

## 2012-01-16 LAB — POCT I-STAT GLUCOSE: Operator id: 135711

## 2012-01-16 SURGERY — JV LEFT HEART CATHETERIZATION WITH CORONARY ANGIOGRAM
Anesthesia: LOCAL

## 2012-01-16 MED ORDER — ASPIRIN 81 MG PO CHEW
324.0000 mg | CHEWABLE_TABLET | ORAL | Status: DC
Start: 2012-01-17 — End: 2012-01-16

## 2012-01-16 MED ORDER — DIAZEPAM 5 MG PO TABS: 5.0000 mg | ORAL_TABLET | ORAL | Status: DC

## 2012-01-16 MED ORDER — SODIUM CHLORIDE 0.9 % IV SOLN: 1.0000 mL/kg/h | INTRAVENOUS | Status: DC

## 2012-01-16 MED ORDER — ACETAMINOPHEN 325 MG PO TABS: 650.0000 mg | ORAL_TABLET | ORAL | Status: DC | PRN

## 2012-01-16 MED ORDER — SODIUM CHLORIDE 0.9 % IV SOLN: INTRAVENOUS | Status: DC

## 2012-01-16 MED ORDER — ONDANSETRON HCL 4 MG/2ML IJ SOLN: 4.0000 mg | Freq: Four times a day (QID) | INTRAMUSCULAR | Status: DC | PRN

## 2012-01-16 NOTE — OR Nursing (Signed)
+  Allen's test right hand 

## 2012-01-16 NOTE — OR Nursing (Signed)
TR band removed, site level 0, pressure dressing and wrist splint applied 

## 2012-01-16 NOTE — H&P (View-Only) (Signed)
 Clinical Summary Mr. Shoaf is a 56 y.o.male referred for cardiology consultation by Dr Luking. Records were reviewed. He has had accelerated hypertension, difficult to control over the last 5 weeks, and has had several medication adjustments recently. He also had a significantly abnormal screening ECG suggestive of acute or recent anterior wall infarct, however this was in the absence of any chest pain, and cardiac markers obtained by Dr.Luking were reassuring with normal troponin I levels.  He was referred for an echocardiogram on 10/8 that demonstrated no focal wall motion abnormality, vigorous LVEF of 75-80%, severe left ventricular hypertrophy consistent with hypertensive heart disease, grade 1 diastolic dysfunction, mild left atrial enlargement, trivial valvular regurgitation, no pericardial effusion.  Recent lab work reviewed finding hemoglobin 17.0, platelets 180, potassium 3.6, BUN 8, creatinine 1.0, total cholesterol 167, LDL 97, normal LFTs. Renal ultrasound demonstrated normal kidney size bilaterally with minimal to mild cortical thickening.  From a symptom perspective, he does report progressive dyspnea on exertion over the last few months, no definite exertional chest pain, but diaphoresis and a feeling of "shakiness." He has had no palpitations or syncope. 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 Dr.Luking has kept him out of work recently.   No Known Allergies  Current Outpatient Prescriptions  Medication Sig Dispense Refill  . amLODipine (NORVASC) 5 MG tablet Take 10 mg by mouth daily.       . buPROPion (WELLBUTRIN XL) 300 MG 24 hr tablet Take 300 mg by mouth every morning.       . hydrochlorothiazide (HYDRODIURIL) 25 MG tablet Take 25 mg by mouth daily.       . KLOR-CON M20 20 MEQ tablet Take 20 mEq by mouth 2 (two) times daily.       . lisinopril (PRINIVIL,ZESTRIL) 40 MG tablet Take 40 mg by mouth daily.       . pantoprazole  (PROTONIX) 40 MG tablet Take 40 mg by mouth daily.       . pravastatin (PRAVACHOL) 80 MG tablet Take 80 mg by mouth daily.       . venlafaxine XR (EFFEXOR-XR) 150 MG 24 hr capsule Take 150 mg by mouth 2 (two) times daily.       . metoprolol succinate (TOPROL-XL) 50 MG 24 hr tablet Take 1 tablet (50 mg total) by mouth daily. Take with or immediately following a meal.  30 tablet  6    Past Medical History  Diagnosis Date  . Hypertension     Greater than 20 years  . Type 2 diabetes mellitus   . Anxiety   . Attention deficit disorder   . Depression   . Obstructive sleep apnea   . TIA (transient ischemic attack)     Possible, 2007    Past Surgical History  Procedure Date  . Neck surgery   . Vasectomy     Family History  Problem Relation Age of Onset  . Colon cancer Mother   . Hypertension Mother   . CAD Mother     Social History Mr. Gerstenberger reports that he has never smoked. He does not have any smokeless tobacco history on file. Mr. Bains reports that he drinks alcohol.  Review of Systems No reported bleeding episodes. Stable appetite. No orthopnea or PND. Has had trouble with leg edema in the past although not recently. Otherwise negative.  Physical Examination Filed Vitals:   01/13/12 0832  BP: 140/100  Pulse: 96   Filed Weights     01/13/12 0832  Weight: 231 lb 6.4 oz (104.962 kg)   Patient anxious but in no acute distress. HEENT: Conjunctiva and lids normal, oropharynx clear with moist mucosa. Neck: Supple, no elevated JVP or carotid bruits, no thyromegaly. Lungs: Clear to auscultation, nonlabored breathing at rest. Cardiac: Regular rate and rhythm, no S3, 2/6 systolic murmur, no pericardial rub. Abdomen: Soft, nontender, bowel sounds present. No bruit. Extremities: No pitting edema, distal pulses 2+. Skin: Warm and dry. Musculoskeletal: No kyphosis. Neuropsychiatric: Alert and oriented x3, affect grossly appropriate.   Problem List and Plan   Accelerated  hypertension Blood pressure remains elevated. Medications were reviewed with the patient and his wife. He reports compliance, and they have been looking more at his diet in terms of sodium restriction. He reports a greater than 20 year history of hypertension, also family history. Basic screening labs show normal renal function and potassium. Dr. Luking indicates that he did send a plasma metanephrine level, although not available to review. He has evidence of probable hypertensive heart disease with severe LVH by echocardiogram, although possibility of hypertrophic nonobstructive cardiomyopathy is also to be considered. For now we will continue his present medication and add Toprol-XL 50 mg daily to provide heart rate control.  Abnormal ECG Significantly abnormal tracing, actually suggestive of acute or recent anterior wall infarct, although not associated with active chest pain or wall motion abnormalities by echocardiogram. Troponin levels were also normal at that time. Pattern is not typical of [place]. Question whether this is a severe repolarization abnormality with associated LVH, although still not typical for this either. In light of his progressive shortness of breath, severe hypertension, and markedly abnormal ECG, we have discussed cardiac catheterization to most clearly evaluate for the presence of significant obstructive underlying CAD and help to guide further therapy. Would also strongly consider distal aortography or selective renal angiography to screen for the presence of renal artery stenosis. Procedure is being scheduled with Dr. Cooper on Monday.  Shortness of breath Could be a function of severe hypertension with hypertensive heart disease, also need to rule out obstructive CAD. See above discussion.  Type 2 diabetes mellitus Being managed with diet measures now by Dr. Luking.    Samuel G. McDowell, M.D., F.A.C.C.   

## 2012-01-16 NOTE — OR Nursing (Signed)
Discharge instructions reviewed and signed, pt stated understanding, ambulated in hall without difficulty, site level 0, transported to wife's car via wheelchair 

## 2012-01-16 NOTE — Interval H&P Note (Signed)
History and Physical Interval Note:  01/16/2012 9:35 AM  Shawn [name]  has presented today for surgery, with the diagnosis of Abnormal EKG/CP  The various methods of treatment have been discussed with the patient and family. After consideration of risks, benefits and other options for treatment, the patient has consented to  Procedure(s) (LRB) with comments: JV LEFT HEART CATHETERIZATION WITH CORONARY ANGIOGRAM (N/A) as a surgical intervention .  The patient's history has been reviewed, patient examined, no change in status, stable for surgery.  I have reviewed the patient's chart and labs.  Questions were answered to the patient's satisfaction.     [name]

## 2012-01-16 NOTE — CV Procedure (Signed)
   Cardiac Catheterization Procedure Note  Name: Shawn [name] MRN: [id number] DOB: 1956-02-11  Procedure: Left Heart Cath, Selective Coronary Angiography, LV angiography, abdominal aortic angiography.  Indication: Abnormal EKG suggestive of anterior infarct in patient with multiple cardiac risk factors, severe hypertension, left ventricular hypertrophy.   Procedural Details: The right wrist was prepped, draped, and anesthetized with 1% lidocaine. Using the modified Seldinger technique, a 5 French sheath was introduced into the right radial artery. 3 mg of verapamil was administered through the sheath, weight-based unfractionated heparin was administered intravenously. Standard Judkins catheters were used for selective coronary angiography and left ventriculography. The pigtail catheter was advanced into the abdominal aorta where aortography was performed to evaluate the renal arteries. Catheter exchanges were performed over an exchange length guidewire. There were no immediate procedural complications. A TR band was used for radial hemostasis at the completion of the procedure.  The patient was transferred to the post catheterization recovery area for further monitoring.  Procedural Findings: Hemodynamics: AO 121/80 LV 126/28  Coronary angiography: Coronary dominance: right  Left mainstem: The left mainstem is short. There is no obstructive disease present.  Left anterior descending (LAD): The LAD is widely patent throughout its course. The vessel is smooth without significant irregularity or stenosis present. The diagonal branches are small to medium in caliber and there is no stenosis present.  Left circumflex (LCx): The left circumflex is patent. There 2 small obtuse marginals followed by a much larger third obtuse marginal. There is no obstructive disease throughout.  Right coronary artery (RCA): The right coronary artery is a large, dominant vessel. The vessel supplies the PDA and 2  posterior lateral branches. There are no stenoses throughout the RCA distribution.  Left ventriculography: Left ventricular systolic function is hyperdynamic. The left ventricular ejection fraction is estimated at 65-75%. There is no significant mitral regurgitation.  Abdominal aortogram: There are single renal arteries bilaterally. The renals are patent without significant stenoses. There is no evidence of abdominal aortic aneurysm.  Final Conclusions:   1. Widely patent coronary arteries without significant obstructive disease 2. Hyperdynamic left ventricular function with elevated left ventricular end-diastolic pressure. Findings consistent with hypertensive heart disease 3. Patent renal arteries bilaterally  Recommendations: Med Rx.   [name] 01/16/2012, 10:25 AM

## 2012-01-26 ENCOUNTER — Ambulatory Visit (INDEPENDENT_AMBULATORY_CARE_PROVIDER_SITE_OTHER): Payer: BC Managed Care – PPO | Admitting: Adult Health

## 2012-01-26 ENCOUNTER — Encounter: Payer: Self-pay | Admitting: Adult Health

## 2012-01-26 VITALS — BP 130/98 | HR 65 | Ht 65.0 in | Wt 226.0 lb

## 2012-01-26 DIAGNOSIS — R9431 Abnormal electrocardiogram [ECG] [EKG]: Secondary | ICD-10-CM

## 2012-01-26 DIAGNOSIS — I10 Essential (primary) hypertension: Secondary | ICD-10-CM

## 2012-01-26 NOTE — Assessment & Plan Note (Signed)
Blood pressure is excellent control at this time. He is on multiple medications for blood pressure control. Cardiac catheterization was negative for CAD and ischemia causing hypertension. Also noted that his renal arteries were patent. He will need to continue on current medication course, lose weight, and avoid salt.

## 2012-01-26 NOTE — Patient Instructions (Addendum)
Your physician recommends that you schedule a follow-up appointment in: 6 MONTHS 

## 2012-01-26 NOTE — Progress Notes (Deleted)
Name: Shawn [name]    DOB: February 24, 1956  Age: 56 y.o.  MR#: [id number]       PCP:  Lilyan Punt, MD      Insurance: @PAYORNAME @   CC:    Chief Complaint  Patient presents with  . Follow-up    post hospital    VS BP 130/98  Pulse 65  Ht 5\' 5"  (1.651 m)  Wt 226 lb (102.513 kg)  BMI 37.61 kg/m2  SpO2 97%  Weights Current Weight  01/26/12 226 lb (102.513 kg)  01/16/12 230 lb (104.327 kg)  01/16/12 230 lb (104.327 kg)    Blood Pressure  BP Readings from Last 3 Encounters:  01/26/12 130/98  01/16/12 125/83  01/16/12 125/83     Admit date:  (Not on file) Last encounter with RMR:  Visit date not found   Allergy No Known Allergies  Current Outpatient Prescriptions  Medication Sig Dispense Refill  . amLODipine (NORVASC) 5 MG tablet Take 10 mg by mouth daily.       Marland Kitchen buPROPion (WELLBUTRIN XL) 300 MG 24 hr tablet Take 300 mg by mouth every morning.       . hydrochlorothiazide (HYDRODIURIL) 25 MG tablet Take 25 mg by mouth daily.       Marland Kitchen KLOR-CON M20 20 MEQ tablet Take 20 mEq by mouth 2 (two) times daily.       Marland Kitchen lisinopril (PRINIVIL,ZESTRIL) 40 MG tablet Take 40 mg by mouth daily.       . metoprolol succinate (TOPROL-XL) 50 MG 24 hr tablet Take 1 tablet (50 mg total) by mouth daily. Take with or immediately following a meal.  30 tablet  6  . pantoprazole (PROTONIX) 40 MG tablet Take 40 mg by mouth daily.       . pravastatin (PRAVACHOL) 80 MG tablet Take 80 mg by mouth daily.       Marland Kitchen venlafaxine XR (EFFEXOR-XR) 150 MG 24 hr capsule Take 150 mg by mouth 2 (two) times daily.         Discontinued Meds:   There are no discontinued medications.  Patient Active Problem List  Diagnosis  . Abnormal ECG  . Accelerated hypertension  . Shortness of breath  . Type 2 diabetes mellitus    LABS Admission on 01/16/2012, Discharged on 01/16/2012  Component Date Value  . Operator id 01/16/2012 [id number]   . Glucose, Bld 01/16/2012 102*  Orders Only on 01/13/2012  Component Date Value    . WBC 01/13/2012 7.2   . RBC 01/13/2012 5.54   . Hemoglobin 01/13/2012 16.6   . HCT 01/13/2012 46.7   . MCV 01/13/2012 84.3   . [facility] 01/13/2012 30.0   . MCHC 01/13/2012 35.5   . RDW 01/13/2012 14.4   . Platelets 01/13/2012 151   Office Visit on 01/13/2012  Component Date Value  . Sodium 01/13/2012 144   . Potassium 01/13/2012 3.7   . Chloride 01/13/2012 101   . CO2 01/13/2012 32   . Glucose, Bld 01/13/2012 70   . BUN 01/13/2012 9   . Creat 01/13/2012 1.29   . Calcium 01/13/2012 9.4   . aPTT 01/13/2012 36   . Prothrombin Time 01/13/2012 14.7   . INR 01/13/2012 1.16      Results for this Opt Visit:     Results for orders placed during the hospital encounter of 01/16/12  POCT I-STAT GLUCOSE      Component Value Range   Operator id [id number]     Glucose, Bld 102 (*)  70 - 99 mg/dL    EKG Orders placed in visit on 01/26/12  . EKG 12-LEAD     Prior Assessment and Plan Problem List as of 01/26/2012            Cardiology Problems   Accelerated hypertension   Last Assessment & Plan Note   01/13/2012 Office Visit Signed 01/13/2012  9:56 AM by Jonelle Sidle, MD    Blood pressure remains elevated. Medications were reviewed with the patient and his wife. He reports compliance, and they have been looking more at his diet in terms of sodium restriction. He reports a greater than 20 year history of hypertension, also family history. Basic screening labs show normal renal function and potassium. Dr. Gerda Diss indicates that he did send a plasma metanephrine level, although not available to review. He has evidence of probable hypertensive heart disease with severe LVH by echocardiogram, although possibility of hypertrophic nonobstructive cardiomyopathy is also to be considered. For now we will continue his present medication and add Toprol-XL 50 mg daily to provide heart rate control.      Other   Abnormal ECG   Last Assessment & Plan Note   01/13/2012 Office Visit Addendum 01/13/2012   9:59 AM by Jonelle Sidle, MD    Significantly abnormal tracing, actually suggestive of acute or recent anterior wall infarct, although not associated with active chest pain or wall motion abnormalities by echocardiogram. Troponin levels were also normal at that time. Pattern is not typical of [place]. Question whether this is a severe repolarization abnormality with associated LVH, although still not typical for this either. In light of his progressive shortness of breath, severe hypertension, and markedly abnormal ECG, we have discussed cardiac catheterization to most clearly evaluate for the presence of significant obstructive underlying CAD and help to guide further therapy. Would also strongly consider distal aortography or selective renal angiography to screen for the presence of renal artery stenosis. Procedure is being scheduled with Dr. Excell Seltzer on Monday.    Shortness of breath   Last Assessment & Plan Note   01/13/2012 Office Visit Signed 01/13/2012 10:00 AM by Jonelle Sidle, MD    Could be a function of severe hypertension with hypertensive heart disease, also need to rule out obstructive CAD. See above discussion.    Type 2 diabetes mellitus   Last Assessment & Plan Note   01/13/2012 Office Visit Signed 01/13/2012 10:00 AM by Jonelle Sidle, MD    Being managed with diet measures now by Dr. Gerda Diss.        Imaging: Dg Chest 2 View  01/13/2012  *RADIOLOGY REPORT*  Clinical Data: Short of breath.  Coughing.  CHEST - 2 VIEW  Comparison: 06/19/2003  Findings: The heart, mediastinum and hila are within normal limits.  The lungs are clear.  No pleural effusion or pneumothorax.  The bony thorax is intact.  There is been a previous anterior cervical spine fusion.  IMPRESSION: No acute cardiopulmonary disease.   Original Report Authenticated By: Amie Portland, M.D.      [facility] Calculation: Score not calculated. Missing: Total Cholesterol, HDL

## 2012-01-26 NOTE — Progress Notes (Signed)
HPI: Shawn [name] is a very nervous 56 year old patient of Dr. Diona Browner worsening on followup after cardiac catheterization secondary to abnormal EKG. the patient has a long-standing history of hypertension that has been difficult to control, and ADD. Secondary to hypertensive heart disease with severe LVH by echocardiogram, cardiac catheterization was requested to evaluate for CAD and hypertropic nonobstructive cardiomyopathy. Along with the cardiac catheterization the patient was also plan for a distal aortography and selective renal angioplasty.    Cardiac catheterization was completed on 01/16/2012 revealing widely patent coronary arteries without significant obstructive disease. He hyperdynamic left ventricular function with elevated LVEDP consistent with hypertensive heart disease. History arteries are patent bilaterally. The patient was placed on metoprolol 50 mg daily. He comes today without any complaint with the exception of his heart rate. He has not seen his heart rate he was in the normal range in many years, and is concerned that his heart rate does not go up when he exercises. He states he likes to work out and would like to get his heart rate up but the medication is not allowing this to happen. He is concerned that this is a problem. Otherwise he has no complaints of chest discomfort dyspnea or weakness.  No Known Allergies  Current Outpatient Prescriptions  Medication Sig Dispense Refill  . amLODipine (NORVASC) 5 MG tablet Take 10 mg by mouth daily.       Marland Kitchen buPROPion (WELLBUTRIN XL) 300 MG 24 hr tablet Take 300 mg by mouth every morning.       . hydrochlorothiazide (HYDRODIURIL) 25 MG tablet Take 25 mg by mouth daily.       Marland Kitchen KLOR-CON M20 20 MEQ tablet Take 20 mEq by mouth 2 (two) times daily.       Marland Kitchen lisinopril (PRINIVIL,ZESTRIL) 40 MG tablet Take 40 mg by mouth daily.       . metoprolol succinate (TOPROL-XL) 50 MG 24 hr tablet Take 1 tablet (50 mg total) by mouth daily. Take with or  immediately following a meal.  30 tablet  6  . pantoprazole (PROTONIX) 40 MG tablet Take 40 mg by mouth daily.       . pravastatin (PRAVACHOL) 80 MG tablet Take 80 mg by mouth daily.       Marland Kitchen venlafaxine XR (EFFEXOR-XR) 150 MG 24 hr capsule Take 150 mg by mouth 2 (two) times daily.         Past Medical History  Diagnosis Date  . Hypertension     Greater than 20 years  . Type 2 diabetes mellitus   . Anxiety   . Attention deficit disorder   . Depression   . Obstructive sleep apnea   . TIA (transient ischemic attack)     Possible, 2007    Past Surgical History  Procedure Date  . Neck surgery   . Vasectomy     ZOX:[id number] of systems complete and found to be negative unless listed above  PHYSICAL EXAM BP 130/98  Pulse 65  Ht 5\' 5"  (1.651 m)  Wt 226 lb (102.513 kg)  BMI 37.61 kg/m2  SpO2 97%  General: Well developed, well nourished, in no acute distress, shakey.  Head: Eyes PERRLA, No xanthomas.   Normal cephalic and atramatic  Lungs: Clear bilaterally to auscultation and percussion. Heart: HRRR S1 S2, without MRG.  Pulses are 2+ & equal.            No carotid bruit. No JVD.  No abdominal bruits. No femoral bruits. Abdomen: Bowel  sounds are positive, abdomen soft and non-tender without masses or                  Hernia's noted. Msk:  Back normal, normal gait. Normal strength and tone for age. Extremities: No clubbing, cyanosis or edema.  DP +1 Neuro: Alert and oriented X 3. Psych:  Good affect, responds appropriately  EKG: NSR rate of 59 bpm.  ASSESSMENT AND PLAN

## 2012-01-26 NOTE — Assessment & Plan Note (Signed)
On today's EKG assessment not finding any abnormalities at this time. Cardiac catheterization was reassuring. He did have hyperdynamic LV function and 75-80%. He is on metoprolol will assist in better cardiac output, and avoid tachycardia mediated cardiomyopathy. I have reassured him that his heart rate is normal on this medication and exercise can be completed to allow his heart rate to go up but not be detrimental to him. He is advised to continue to exercise at his leisure. He will continue his medications as directed and we will make no further changes at this time. He will followup with Dr. Diona Browner in 6 months unless he becomes symptomatic.

## 2012-02-27 ENCOUNTER — Other Ambulatory Visit: Payer: Self-pay | Admitting: Cardiology

## 2012-02-27 DIAGNOSIS — I10 Essential (primary) hypertension: Secondary | ICD-10-CM

## 2012-02-27 MED ORDER — METOPROLOL SUCCINATE ER 50 MG PO TB24: 50.0000 mg | ORAL_TABLET | Freq: Every day | ORAL | Status: DC

## 2012-05-29 ENCOUNTER — Other Ambulatory Visit: Payer: Self-pay | Admitting: *Deleted

## 2012-05-29 MED ORDER — AMLODIPINE BESYLATE 5 MG PO TABS: 10.0000 mg | ORAL_TABLET | Freq: Every day | ORAL | Status: DC

## 2012-06-16 ENCOUNTER — Encounter: Payer: Self-pay | Admitting: *Deleted

## 2012-06-16 ENCOUNTER — Other Ambulatory Visit: Payer: Self-pay | Admitting: Family Medicine

## 2012-06-18 NOTE — Telephone Encounter (Signed)
Last seen in November 12,2013

## 2012-06-18 NOTE — Telephone Encounter (Signed)
Waiting for md approval

## 2012-06-18 NOTE — Telephone Encounter (Signed)
May give 3 refills 

## 2012-06-18 NOTE — Telephone Encounter (Signed)
RX phoned in on pharmacy voicemail

## 2012-08-03 ENCOUNTER — Ambulatory Visit: Payer: BC Managed Care – PPO | Admitting: Adult Health

## 2012-08-12 NOTE — Progress Notes (Signed)
HPI: [name] is a very nervous 57 year old patient of Dr. Diona Browner we are seeing for ongoing assessment and management of hypertension which has been difficult to control, ADD, hypertensive heart disease with severe LVH by echocardiogram. Patient had a cardiac catheterization in November 2013 revealing widely patent coronaries. He was treated medically with amlodipine, lisinopril, and metoprolol. He is also on HCTZ. On last visit on 01/26/2012 the patient was concerned that his heart was too low on the metoprolol. Reassurance was given. Heart rate was 65 beats per minute at rest.   He comes today complaining of being tired. Under stress with unhappy marriage. He is jittery today, but this is baseline for him.   No Known Allergies  Current Outpatient Prescriptions  Medication Sig Dispense Refill  . amLODipine (NORVASC) 5 MG tablet Take 2 tablets (10 mg total) by mouth daily.  90 tablet  1  . aspirin 81 MG tablet Take 81 mg by mouth daily.      Marland Kitchen buPROPion (WELLBUTRIN XL) 300 MG 24 hr tablet Take 300 mg by mouth every morning.       . diazepam (VALIUM) 10 MG tablet Take 10 mg by mouth as directed.      . hydrochlorothiazide (HYDRODIURIL) 25 MG tablet Take 25 mg by mouth daily.       Marland Kitchen KLOR-CON M20 20 MEQ tablet Take 20 mEq by mouth 2 (two) times daily.       Marland Kitchen lisinopril (PRINIVIL,ZESTRIL) 40 MG tablet Take 40 mg by mouth daily.       . metoprolol succinate (TOPROL-XL) 50 MG 24 hr tablet Take 1 tablet (50 mg total) by mouth daily. Take with or immediately following a meal.  30 tablet  6  . pantoprazole (PROTONIX) 40 MG tablet Take 40 mg by mouth daily.       . pravastatin (PRAVACHOL) 80 MG tablet Take 80 mg by mouth daily.       Marland Kitchen venlafaxine XR (EFFEXOR-XR) 150 MG 24 hr capsule Take 150 mg by mouth 2 (two) times daily.        No current facility-administered medications for this visit.    Past Medical History  Diagnosis Date  . Hypertension     Greater than 20 years  . Type 2 diabetes  mellitus   . Anxiety   . Attention deficit disorder   . Depression   . Obstructive sleep apnea   . TIA (transient ischemic attack)     Possible, 2007    Past Surgical History  Procedure Laterality Date  . Neck surgery    . Vasectomy      ROS: Review of systems complete and found to be negative unless listed above  PHYSICAL EXAM BP 147/100  Pulse 71  Ht 5\' 5"  (1.651 m)  Wt 230 lb 1.9 oz (104.382 kg)  BMI 38.29 kg/m2 General: Well developed, well nourished, in no acute distress Head: Eyes PERRLA, No xanthomas.   Normal cephalic and atramatic  Lungs: Clear bilaterally to auscultation and percussion. Heart: HRRR S1 S2, without MRG.  Pulses are 2+ & equal.            No carotid bruit. No JVD.  No abdominal bruits. No femoral bruits. Abdomen: Bowel sounds are positive, abdomen soft and non-tender without masses or                  Hernia's noted. Msk:  Back normal, normal gait. Normal strength and tone for age. Extremities: No clubbing, cyanosis or  edema.  DP +1 Neuro: Alert and oriented X 3. Psych:  Good affect, responds appropriately, jittery.  KVQ:[id number] rhythm with 1st degree AV block. Rate of 6 bpm.  ASSESSMENT AND PLAN

## 2012-08-13 ENCOUNTER — Encounter: Payer: Self-pay | Admitting: Adult Health

## 2012-08-13 ENCOUNTER — Ambulatory Visit (INDEPENDENT_AMBULATORY_CARE_PROVIDER_SITE_OTHER): Payer: BC Managed Care – PPO | Admitting: Adult Health

## 2012-08-13 VITALS — BP 147/100 | HR 71 | Ht 65.0 in | Wt 230.1 lb

## 2012-08-13 DIAGNOSIS — I10 Essential (primary) hypertension: Secondary | ICD-10-CM

## 2012-08-13 DIAGNOSIS — E119 Type 2 diabetes mellitus without complications: Secondary | ICD-10-CM

## 2012-08-13 NOTE — Assessment & Plan Note (Signed)
Followed by PCP with medication adjustments and labs. Will defer.

## 2012-08-13 NOTE — Assessment & Plan Note (Signed)
Blood pressure is well controlled. He complains of fatigue. We tried to change metoprolol dose to HS to assist with this but he states that this was not helpful to him. He admits to not wearing CPAP as directed, which is also playing a role in his fatigue. Will not make any medication changes at this time. Follow up in one year.

## 2012-08-13 NOTE — Patient Instructions (Addendum)
Your physician wants you to follow-up in: 1 year with Joni Reining, NP.  You will receive a reminder letter in the mail two months in advance. If you don't receive a letter, please call our office to schedule the follow-up appointment.  WEAR YOUR CPAP!!

## 2012-08-25 ENCOUNTER — Other Ambulatory Visit: Payer: Self-pay | Admitting: Family Medicine

## 2012-09-01 ENCOUNTER — Other Ambulatory Visit: Payer: Self-pay | Admitting: Family Medicine

## 2012-09-12 ENCOUNTER — Ambulatory Visit (INDEPENDENT_AMBULATORY_CARE_PROVIDER_SITE_OTHER): Payer: BC Managed Care – PPO | Admitting: Family Medicine

## 2012-09-12 ENCOUNTER — Encounter: Payer: Self-pay | Admitting: Family Medicine

## 2012-09-12 VITALS — BP 138/80 | Temp 98.5°F | Wt 234.0 lb

## 2012-09-12 DIAGNOSIS — R5381 Other malaise: Secondary | ICD-10-CM

## 2012-09-12 DIAGNOSIS — E1169 Type 2 diabetes mellitus with other specified complication: Secondary | ICD-10-CM | POA: Insufficient documentation

## 2012-09-12 DIAGNOSIS — R739 Hyperglycemia, unspecified: Secondary | ICD-10-CM

## 2012-09-12 DIAGNOSIS — E785 Hyperlipidemia, unspecified: Secondary | ICD-10-CM

## 2012-09-12 DIAGNOSIS — R7309 Other abnormal glucose: Secondary | ICD-10-CM

## 2012-09-12 DIAGNOSIS — I10 Essential (primary) hypertension: Secondary | ICD-10-CM

## 2012-09-12 DIAGNOSIS — R5383 Other fatigue: Secondary | ICD-10-CM

## 2012-09-12 DIAGNOSIS — G4733 Obstructive sleep apnea (adult) (pediatric): Secondary | ICD-10-CM

## 2012-09-12 NOTE — Progress Notes (Signed)
  Subjective:    Patient ID: Shawn Das., male    DOB: August 24, 1955, 57 y.o.   MRN: [id number]  HPIhere for fatigue. No energy. He states he doesn't feel like doing anything for the last couple of months. He relates his CPAP machine broke over a year ago he has not used it since his last sleep study was 2002 showed significant sleep apnea. He relates a lot of fatigue tiredness low energy he wants to get things done he denies being depressed he just states he feels really tired. He denies rectal bleeding denies fevers chills sweats denies nausea vomiting diarrhea dysuria.  He also saw cardiologist late last year was diagnosed with hypertrophy of the heart and hypertension and put him on a beta blocker he feels that is causing some of his fatigue  Family history noncontributory  Review of Systems  Constitutional: Positive for fatigue. Negative for fever, activity change and appetite change.  HENT: Negative for congestion, rhinorrhea and neck pain.   Eyes: Negative for discharge.  Respiratory: Negative for cough and wheezing.   Cardiovascular: Negative for chest pain.  Gastrointestinal: Negative for vomiting, abdominal pain and blood in stool.  Genitourinary: Negative for frequency and difficulty urinating.  Skin: Negative for rash.  Allergic/Immunologic: Negative for environmental allergies and food allergies.  Neurological: Negative for weakness and headaches.  Psychiatric/Behavioral: Negative for agitation.       Objective:   Physical Exam  Nursing note and vitals reviewed. Constitutional: He appears well-developed and well-nourished.  HENT:  Head: Normocephalic and atraumatic.  Nose: Nose normal.  Mouth/Throat: Oropharynx is clear and moist.  Eyes: EOM are normal. Pupils are equal, round, and reactive to light.  Neck: Normal range of motion. Neck supple. No thyromegaly present.  Cardiovascular: Normal rate, regular rhythm and normal heart sounds.   No murmur  heard. Pulmonary/Chest: Effort normal and breath sounds normal. No respiratory distress. He has no wheezes.  Abdominal: Soft. Bowel sounds are normal. He exhibits no distension and no mass. There is no tenderness.  Musculoskeletal: Normal range of motion. He exhibits no edema.  Lymphadenopathy:    He has no cervical adenopathy.  Neurological: He is alert. He exhibits normal muscle tone.  Skin: Skin is warm and dry. No erythema.  Psychiatric: He has a normal mood and affect. His behavior is normal. Judgment normal.          Assessment & Plan:  #1 severe fatigue we will do some lab work were also recommend a sleep study as an outpatient to do a home titration study I believe he needs any machine. I believe this will dramatically help him. No new medications currently. Await the results of all of his tests. 25 minutes was spent with the patient discussing all these issues greater than half in discussion

## 2012-09-13 ENCOUNTER — Telehealth: Payer: Self-pay | Admitting: Family Medicine

## 2012-09-13 NOTE — Telephone Encounter (Signed)
Given the issues he had with his blood pressure if we started his med back it would be first,just 36 mg Concerta daily with a recheck here of blood pressure in 3 to 4 weeks. See if pt agreeable. We may be able to increase dose to 54 if tolerating 36, this could improve his focus and energy. If his blood pressure escalates with the med then would need to stop the med.If pt agreeable, write Concerta 36mg ,1qd, #30 with fu OV in 3-4 weeks

## 2012-09-13 NOTE — Telephone Encounter (Signed)
Pt was seen 09/12/12 and forgot to ask if he can go back on his Concerta prescription, if he can please call him an leave a message on his voicemail to pick up script.

## 2012-09-14 NOTE — Telephone Encounter (Signed)
Notified patient given the issues he had with his blood pressure if we started his med back it would be first,just 36 mg Concerta daily with a recheck here of blood pressure in 3 to 4 weeks. We may be able to increase dose to 54 if tolerating 36, this could improve his focus and energy. If his blood pressure escalates with the med then would need to stop the med. Concerta 36 mg #30 1 qd rx upfront ready for pickup. Patient verbalized understanding.

## 2012-09-14 NOTE — Telephone Encounter (Signed)
Left message to return call (script already signed in message basket)

## 2012-09-18 ENCOUNTER — Other Ambulatory Visit: Payer: Self-pay | Admitting: Family Medicine

## 2012-09-20 LAB — T4, FREE: Free T4: 1.06 ng/dL (ref 0.80–1.80)

## 2012-09-20 LAB — CBC WITH DIFFERENTIAL/PLATELET
Basophils Relative: 1 % (ref 0–1)
Eosinophils Absolute: 0.3 10*3/uL (ref 0.0–0.7)
Hemoglobin: 16.6 g/dL (ref 13.0–17.0)
Lymphs Abs: 2.2 10*3/uL (ref 0.7–4.0)
MCH: 30.9 pg (ref 26.0–34.0)
Monocytes Relative: 11 % (ref 3–12)
Neutro Abs: 3.8 10*3/uL (ref 1.7–7.7)
Neutrophils Relative %: 53 % (ref 43–77)
Platelets: 169 10*3/uL (ref 150–400)
RBC: 5.37 MIL/uL (ref 4.22–5.81)

## 2012-09-20 LAB — TSH: TSH: 3.106 u[IU]/mL (ref 0.350–4.500)

## 2012-09-20 LAB — LIPID PANEL
HDL: 31 mg/dL — ABNORMAL LOW (ref >39)
LDL Cholesterol: 75 mg/dL (ref 0–99)
Total CHOL/HDL Ratio: 4.6 Ratio
VLDL: 36 mg/dL (ref 0–40)

## 2012-09-20 LAB — BASIC METABOLIC PANEL
BUN: 11 mg/dL (ref 6–23)
CO2: 31 mEq/L (ref 19–32)
Chloride: 101 mEq/L (ref 96–112)
Creat: 1.11 mg/dL (ref 0.50–1.35)
Glucose, Bld: 97 mg/dL (ref 70–99)
Potassium: 3.3 mEq/L — ABNORMAL LOW (ref 3.5–5.3)

## 2012-09-20 LAB — HEPATIC FUNCTION PANEL
AST: 31 U/L (ref 0–37)
Albumin: 4.1 g/dL (ref 3.5–5.2)
Bilirubin, Direct: 0.2 mg/dL (ref 0.0–0.3)
Total Bilirubin: 0.8 mg/dL (ref 0.3–1.2)

## 2012-09-27 ENCOUNTER — Other Ambulatory Visit: Payer: Self-pay

## 2012-09-27 DIAGNOSIS — G4730 Sleep apnea, unspecified: Secondary | ICD-10-CM

## 2012-09-28 ENCOUNTER — Other Ambulatory Visit: Payer: Self-pay

## 2012-09-28 DIAGNOSIS — E876 Hypokalemia: Secondary | ICD-10-CM

## 2012-10-18 ENCOUNTER — Other Ambulatory Visit: Payer: Self-pay

## 2012-10-18 DIAGNOSIS — I10 Essential (primary) hypertension: Secondary | ICD-10-CM

## 2012-10-18 MED ORDER — METOPROLOL SUCCINATE ER 50 MG PO TB24: 50.0000 mg | ORAL_TABLET | Freq: Every day | ORAL | Status: DC

## 2012-10-19 ENCOUNTER — Other Ambulatory Visit: Payer: Self-pay | Admitting: Family Medicine

## 2012-11-15 ENCOUNTER — Telehealth: Payer: Self-pay

## 2012-11-15 NOTE — Telephone Encounter (Signed)
11-14-12, Unable to contact patient to schedule test. Have tried multiple times.

## 2012-11-27 ENCOUNTER — Ambulatory Visit: Payer: BC Managed Care – PPO | Admitting: Family Medicine

## 2012-12-23 ENCOUNTER — Other Ambulatory Visit: Payer: Self-pay | Admitting: Family Medicine

## 2012-12-24 ENCOUNTER — Other Ambulatory Visit: Payer: Self-pay | Admitting: Family Medicine

## 2013-01-17 ENCOUNTER — Other Ambulatory Visit: Payer: Self-pay | Admitting: Family Medicine

## 2013-01-17 NOTE — Telephone Encounter (Signed)
1 refill, needs follow-up.

## 2013-02-19 ENCOUNTER — Other Ambulatory Visit: Payer: Self-pay | Admitting: Family Medicine

## 2013-02-21 NOTE — Telephone Encounter (Signed)
1 refill only, needs ov

## 2013-04-13 ENCOUNTER — Other Ambulatory Visit: Payer: Self-pay | Admitting: Family Medicine

## 2013-04-19 ENCOUNTER — Other Ambulatory Visit: Payer: Self-pay | Admitting: Family Medicine

## 2013-04-22 NOTE — Telephone Encounter (Signed)
May fill once/needs OV

## 2013-04-23 ENCOUNTER — Other Ambulatory Visit: Payer: Self-pay | Admitting: Family Medicine

## 2013-05-23 ENCOUNTER — Other Ambulatory Visit: Payer: Self-pay | Admitting: Family Medicine

## 2013-06-10 ENCOUNTER — Other Ambulatory Visit: Payer: Self-pay | Admitting: Family Medicine

## 2013-07-01 ENCOUNTER — Other Ambulatory Visit: Payer: Self-pay | Admitting: Cardiology

## 2013-07-21 ENCOUNTER — Other Ambulatory Visit: Payer: Self-pay | Admitting: Family Medicine

## 2013-07-21 ENCOUNTER — Other Ambulatory Visit: Payer: Self-pay | Admitting: Cardiology

## 2013-08-09 ENCOUNTER — Other Ambulatory Visit: Payer: Self-pay | Admitting: Family Medicine

## 2013-08-24 ENCOUNTER — Other Ambulatory Visit: Payer: Self-pay | Admitting: Family Medicine

## 2013-08-27 ENCOUNTER — Other Ambulatory Visit: Payer: Self-pay | Admitting: Family Medicine

## 2013-09-03 ENCOUNTER — Other Ambulatory Visit: Payer: Self-pay | Admitting: Family Medicine

## 2013-09-03 NOTE — Telephone Encounter (Signed)
Last seen 09/12/12

## 2013-09-03 NOTE — Telephone Encounter (Signed)
1 refill each , needs OV

## 2013-09-15 ENCOUNTER — Other Ambulatory Visit: Payer: Self-pay | Admitting: Family Medicine

## 2013-10-02 ENCOUNTER — Other Ambulatory Visit: Payer: Self-pay | Admitting: Family Medicine

## 2013-10-15 ENCOUNTER — Other Ambulatory Visit: Payer: Self-pay | Admitting: Family Medicine

## 2013-11-08 ENCOUNTER — Other Ambulatory Visit: Payer: Self-pay | Admitting: Family Medicine

## 2013-11-19 ENCOUNTER — Ambulatory Visit (INDEPENDENT_AMBULATORY_CARE_PROVIDER_SITE_OTHER): Payer: BC Managed Care – PPO | Admitting: Cardiology

## 2013-11-19 ENCOUNTER — Encounter: Payer: Self-pay | Admitting: Cardiology

## 2013-11-19 VITALS — BP 150/98 | HR 64 | Ht 66.0 in | Wt 229.0 lb

## 2013-11-19 DIAGNOSIS — Z9889 Other specified postprocedural states: Secondary | ICD-10-CM | POA: Insufficient documentation

## 2013-11-19 DIAGNOSIS — G4733 Obstructive sleep apnea (adult) (pediatric): Secondary | ICD-10-CM

## 2013-11-19 DIAGNOSIS — R5383 Other fatigue: Secondary | ICD-10-CM

## 2013-11-19 DIAGNOSIS — I10 Essential (primary) hypertension: Secondary | ICD-10-CM

## 2013-11-19 DIAGNOSIS — R5381 Other malaise: Secondary | ICD-10-CM

## 2013-11-19 NOTE — Assessment & Plan Note (Signed)
Normal coronary and renal arteries documented in November 2013 the setting of abnormal ECG. Our follow up will be as needed.

## 2013-11-19 NOTE — Patient Instructions (Signed)
Your physician recommends that you schedule a follow-up appointment in: only if needed    We made you an apt with Dr.Scott Luking tomorrow 11/20/13 at [facility]    phone  [phone]    Thank you for choosing [facility] !

## 2013-11-19 NOTE — Assessment & Plan Note (Signed)
Noted by history, may have some correlation with his daytime somnolence and fatigue. Not certain about regular CPAP use.

## 2013-11-19 NOTE — Assessment & Plan Note (Signed)
Current medications reviewed. He reports compliance. Have recommended follow up with Dr. Wolfgang Phoenix.

## 2013-11-19 NOTE — Progress Notes (Signed)
Clinical Summary Shawn [name] is a 58 y.o.male last seen by Ms. Lawrence NP in June 2014. I saw him in consultation back in November 2013 for evaluation of accelerating hypertension and abnormal ECG. He underwent cardiac catheterization at that time which fortunately showed normal coronary arteries as well as renal arteries. At his last encounter blood pressure was better controlled.  He has not maintained regular followup with primary care, last visit with Dr. Wolfgang Phoenix in July 2014. He made this visit today discussing varieties of symptoms that he has been experiencing. He states that he is fatigued, somnolent during the daytime, goes to sleep easily when he rests. Also experiencing episodes of weakness and diaphoresis, states that these can bear weight he something sweet. He is concerned about his blood sugars. Also short of breath with activity.  He reports that he has been taking his medications regularly. He still works at [employer] in [employer]. Seems shaky and somewhat anxious on examination, seems to have a tremor.  ECG today shows sinus rhythm with borderline prolonged PR interval, leftward axis, decreased anterior with progression.   No Known Allergies  Current Outpatient Prescriptions  Medication Sig Dispense Refill  . amLODipine (NORVASC) 10 MG tablet TAKE 1 TABLET EVERY DAY  30 tablet  0  . aspirin 81 MG tablet Take 81 mg by mouth daily.      Marland Kitchen buPROPion (WELLBUTRIN XL) 300 MG 24 hr tablet TAKE 1 TABLET DAILY  15 tablet  0  . diazepam (VALIUM) 10 MG tablet TAKE 1 TABLET BY MOUTH 3 TIMES A DAY AS NEEDED (NEEDS TO SCHEDULE APPT)  90 tablet  0  . hydrochlorothiazide (HYDRODIURIL) 25 MG tablet TAKE 1 TABLET (25 MG TOTAL) BY MOUTH DAILY. NEEDS OFFICE VISIT.  30 tablet  0  . KLOR-CON M20 20 MEQ tablet TAKE 2 TABLETS EACH MORNING  180 tablet  3  . lisinopril (PRINIVIL,ZESTRIL) 40 MG tablet TAKE 1 TABLET BY MOUTH EVERY DAY  7 tablet  0  . metoprolol succinate (TOPROL-XL) 50 MG 24  hr tablet TAKE 1 TABLET BY MOUTH DAILY. TAKE WITH OR IMMEDIATELY FOLLOWING A MEAL.  30 tablet  6  . pantoprazole (PROTONIX) 40 MG tablet TAKE 1 TABLET BY MOUTH EVERY DAY  90 tablet  1  . pravastatin (PRAVACHOL) 80 MG tablet TAKE 1 TABLET BY MOUTH EVERY DAY  90 tablet  0  . venlafaxine XR (EFFEXOR-XR) 150 MG 24 hr capsule TAKE ONE CAPSULE TWICE A DAY  180 capsule  0   No current facility-administered medications for this visit.    Past Medical History  Diagnosis Date  . Essential hypertension, benign     Long-standing history, negative secondary workup  . Type 2 diabetes mellitus   . Anxiety   . Attention deficit disorder   . Depression   . Obstructive sleep apnea   . TIA (transient ischemic attack)     Possible, 2007  . History of cardiac catheterization     Widely patent coronary and renal arteries November 2013    Social History Mr. Racca reports that he has never smoked. He has never used smokeless tobacco. Mr. Lazcano reports that he drinks alcohol.  Review of Systems Appetite has been stable. No syncope. No orthopnea. Other systems reviewed and negative except as outlined.  Physical Examination Filed Vitals:   11/19/13 1552  BP: 150/98  Pulse: 64   Filed Weights   11/19/13 1552  Weight: 229 lb (103.874 kg)    Somewhat disheveled,  no distress. HEENT: Conjunctiva and lids normal, oropharynx clear.  Neck: Supple, no elevated JVP or carotid bruits, no thyromegaly.  Lungs: Clear to auscultation, nonlabored breathing at rest.  Cardiac: Regular rate and rhythm, no S3, 2/6 systolic murmur, no pericardial rub.  Abdomen: Soft, nontender, bowel sounds present. No bruit.  Extremities: No pitting edema, distal pulses 2+.  Skin: Warm and dry.  Musculoskeletal: No kyphosis.  Neuropsychiatric: Tremor noted, alert and oriented x3, affect somewhat flat.   Problem List and Plan   Fatigue Etiology uncertain, could be multifactorial. He seems to have a tremor, also fairly flat  affect, question whether he has some developing CNS process. He also describes episodes of shakiness and weakness with diaphoresis, questions his "blood sugar dropping." Has not had follow with Dr. Wolfgang Phoenix in an over a year, we called his office to make a visit for tomorrow morning.  Obstructive sleep apnea Noted by history, may have some correlation with his daytime somnolence and fatigue. Not certain about regular CPAP use.  History of cardiac catheterization Normal coronary and renal arteries documented in November 2013 the setting of abnormal ECG. Our follow up will be as needed.  Essential hypertension, benign Current medications reviewed. He reports compliance. Have recommended follow up with Dr. Wolfgang Phoenix.    Satira Sark, M.D., F.A.C.C.

## 2013-11-19 NOTE — Assessment & Plan Note (Signed)
Etiology uncertain, could be multifactorial. He seems to have a tremor, also fairly flat affect, question whether he has some developing CNS process. He also describes episodes of shakiness and weakness with diaphoresis, questions his "blood sugar dropping." Has not had follow with Dr. Wolfgang Phoenix in an over a year, we called his office to make a visit for tomorrow morning.

## 2013-11-20 ENCOUNTER — Encounter: Payer: Self-pay | Admitting: Family Medicine

## 2013-11-20 ENCOUNTER — Ambulatory Visit (INDEPENDENT_AMBULATORY_CARE_PROVIDER_SITE_OTHER): Payer: BC Managed Care – PPO | Admitting: Family Medicine

## 2013-11-20 VITALS — BP 136/98 | Temp 98.7°F | Ht 66.0 in | Wt 228.0 lb

## 2013-11-20 DIAGNOSIS — R5382 Chronic fatigue, unspecified: Secondary | ICD-10-CM

## 2013-11-20 DIAGNOSIS — G4733 Obstructive sleep apnea (adult) (pediatric): Secondary | ICD-10-CM

## 2013-11-20 DIAGNOSIS — E119 Type 2 diabetes mellitus without complications: Secondary | ICD-10-CM

## 2013-11-20 DIAGNOSIS — I10 Essential (primary) hypertension: Secondary | ICD-10-CM

## 2013-11-20 DIAGNOSIS — R5381 Other malaise: Secondary | ICD-10-CM

## 2013-11-20 DIAGNOSIS — Z125 Encounter for screening for malignant neoplasm of prostate: Secondary | ICD-10-CM

## 2013-11-20 DIAGNOSIS — R5383 Other fatigue: Secondary | ICD-10-CM

## 2013-11-20 DIAGNOSIS — E785 Hyperlipidemia, unspecified: Secondary | ICD-10-CM

## 2013-11-20 NOTE — Progress Notes (Signed)
   Subjective:    Patient ID: Shawn Greaves., male    DOB: May 06, 1955, 58 y.o.   MRN: [id number]  HPIFatigue, weakness, light headed, insomia, sweating, and nausea. Started over 1 year ago. Saw Cardiology Dr. Domenic Polite yesterday. They did an EKG.  Long discussion held with the patient is having fatigue and tiredness throughout the day some sleepiness. Used to be on CPAP she not using it reliably  He denies chest tightness pressure pain shortness breath nausea vomiting diarrhea  Relates a lot of fatigue and tiredness wonders if it could be other things  He states he doesn't always eat healthy does not do physical exercise   25 minutes spent with patient discussing his multiple issues   Review of Systems  Constitutional: Negative for activity change, appetite change and fatigue.  HENT: Negative for congestion.   Respiratory: Negative for cough.   Cardiovascular: Negative for chest pain.  Gastrointestinal: Negative for abdominal pain.  Endocrine: Negative for polydipsia and polyphagia.  Neurological: Negative for weakness.  Psychiatric/Behavioral: Negative for confusion.       Objective:   Physical Exam  Vitals reviewed. Constitutional: He appears well-nourished. No distress.  Cardiovascular: Normal rate, regular rhythm and normal heart sounds.   No murmur heard. Pulmonary/Chest: Effort normal and breath sounds normal. No respiratory distress.  Musculoskeletal: He exhibits no edema.  Lymphadenopathy:    He has no cervical adenopathy.  Neurological: He is alert.  Psychiatric: His behavior is normal.          Assessment & Plan:  Fatigue and tiredness could be related to sleep apnea I recommend referral to sleep specialist will need special testing probable sleep study in titration  Mild orthostatic hypotension on multiple medications it is hard to know if he is taking them reliably or not he states he is reduced amlodipine 5 mg  Fatigue and tiredness probably related  to sleep apnea encourage patient do some physical exercise he relates he is keeping his mental health doing well continue medication  Lab work ordered  He would like to resume Adderall but I recommend we hold off on this until we see how his blood pressure is doing on followup as well as his lab work in 3-4 weeks

## 2013-11-20 NOTE — Patient Instructions (Signed)
#  1- please do your lab work  #2- with your medications I recommend that you continue BUT we will reduce the amlodipine from 10mg  to 5 mg, your blood pressure drops when you stand so reducing this should help  #3- I am concerned about sleep apnea this can affect energy and cause fatigue. There is a specialist that does a great job with this condition that I can help you get an appointment with  #4- when you come back for blood pressure follow up in 4 weeks please BRING ALL MEDICINE bottles with you.

## 2013-11-26 ENCOUNTER — Other Ambulatory Visit: Payer: Self-pay | Admitting: Family Medicine

## 2013-12-02 ENCOUNTER — Encounter: Payer: Self-pay | Admitting: Family Medicine

## 2013-12-03 ENCOUNTER — Other Ambulatory Visit: Payer: Self-pay | Admitting: Family Medicine

## 2013-12-10 LAB — CBC WITH DIFFERENTIAL/PLATELET
Basophils Absolute: 0.1 10*3/uL (ref 0.0–0.1)
Basophils Relative: 1 % (ref 0–1)
EOS ABS: 0.3 10*3/uL (ref 0.0–0.7)
EOS PCT: 5 % (ref 0–5)
HCT: 47 % (ref 39.0–52.0)
HEMOGLOBIN: 16.1 g/dL (ref 13.0–17.0)
Lymphocytes Relative: 32 % (ref 12–46)
Lymphs Abs: 2.1 10*3/uL (ref 0.7–4.0)
MCH: 30.2 pg (ref 26.0–34.0)
MCHC: 34.3 g/dL (ref 30.0–36.0)
MCV: 88.2 fL (ref 78.0–100.0)
MONOS PCT: 11 % (ref 3–12)
Monocytes Absolute: 0.7 10*3/uL (ref 0.1–1.0)
NEUTROS PCT: 51 % (ref 43–77)
Neutro Abs: 3.3 10*3/uL (ref 1.7–7.7)
Platelets: 155 10*3/uL (ref 150–400)
RBC: 5.33 MIL/uL (ref 4.22–5.81)
RDW: 14.2 % (ref 11.5–15.5)
WBC: 6.5 10*3/uL (ref 4.0–10.5)

## 2013-12-10 LAB — BASIC METABOLIC PANEL
BUN: 10 mg/dL (ref 6–23)
CALCIUM: 9.1 mg/dL (ref 8.4–10.5)
CO2: 31 mEq/L (ref 19–32)
Chloride: 103 mEq/L (ref 96–112)
Creat: 1.17 mg/dL (ref 0.50–1.35)
Glucose, Bld: 128 mg/dL — ABNORMAL HIGH (ref 70–99)
Potassium: 3.4 mEq/L — ABNORMAL LOW (ref 3.5–5.3)
SODIUM: 142 meq/L (ref 135–145)

## 2013-12-10 LAB — HEPATIC FUNCTION PANEL
ALBUMIN: 3.9 g/dL (ref 3.5–5.2)
ALT: 49 U/L (ref 0–53)
AST: 44 U/L — AB (ref 0–37)
Alkaline Phosphatase: 70 U/L (ref 39–117)
BILIRUBIN DIRECT: 0.2 mg/dL (ref 0.0–0.3)
Indirect Bilirubin: 0.9 mg/dL (ref 0.2–1.2)
Total Bilirubin: 1.1 mg/dL (ref 0.2–1.2)
Total Protein: 6.8 g/dL (ref 6.0–8.3)

## 2013-12-10 LAB — LIPID PANEL
CHOL/HDL RATIO: 4.7 ratio
CHOLESTEROL: 140 mg/dL (ref 0–200)
HDL: 30 mg/dL — ABNORMAL LOW (ref >39)
LDL Cholesterol: 76 mg/dL (ref 0–99)
Triglycerides: 171 mg/dL — ABNORMAL HIGH (ref <150)
VLDL: 34 mg/dL (ref 0–40)

## 2013-12-11 LAB — PSA: PSA: 0.68 ng/mL (ref <=4.00)

## 2013-12-15 ENCOUNTER — Other Ambulatory Visit: Payer: Self-pay | Admitting: Family Medicine

## 2013-12-16 NOTE — Telephone Encounter (Signed)
May refill there's +4 additional refills

## 2013-12-19 ENCOUNTER — Encounter: Payer: Self-pay | Admitting: Family Medicine

## 2013-12-19 ENCOUNTER — Ambulatory Visit (INDEPENDENT_AMBULATORY_CARE_PROVIDER_SITE_OTHER): Payer: BC Managed Care – PPO | Admitting: Family Medicine

## 2013-12-19 VITALS — BP 114/90 | Ht 66.0 in | Wt 226.0 lb

## 2013-12-19 DIAGNOSIS — I10 Essential (primary) hypertension: Secondary | ICD-10-CM

## 2013-12-19 DIAGNOSIS — R7303 Prediabetes: Secondary | ICD-10-CM

## 2013-12-19 DIAGNOSIS — K760 Fatty (change of) liver, not elsewhere classified: Secondary | ICD-10-CM | POA: Insufficient documentation

## 2013-12-19 DIAGNOSIS — E785 Hyperlipidemia, unspecified: Secondary | ICD-10-CM

## 2013-12-19 DIAGNOSIS — G4733 Obstructive sleep apnea (adult) (pediatric): Secondary | ICD-10-CM

## 2013-12-19 DIAGNOSIS — IMO0002 Reserved for concepts with insufficient information to code with codable children: Secondary | ICD-10-CM

## 2013-12-19 DIAGNOSIS — Z23 Encounter for immunization: Secondary | ICD-10-CM

## 2013-12-19 DIAGNOSIS — E1165 Type 2 diabetes mellitus with hyperglycemia: Secondary | ICD-10-CM

## 2013-12-19 DIAGNOSIS — R7309 Other abnormal glucose: Secondary | ICD-10-CM

## 2013-12-19 LAB — POCT GLYCOSYLATED HEMOGLOBIN (HGB A1C): Hemoglobin A1C: 7.2

## 2013-12-19 MED ORDER — METHYLPHENIDATE HCL ER (OSM) 54 MG PO TBCR: 54.0000 mg | EXTENDED_RELEASE_TABLET | ORAL | Status: DC

## 2013-12-19 MED ORDER — METFORMIN HCL 500 MG PO TABS: 500.0000 mg | ORAL_TABLET | Freq: Two times a day (BID) | ORAL | Status: DC

## 2013-12-19 NOTE — Progress Notes (Signed)
   Subjective:    Patient ID: Shawn Greaves., male    DOB: 07/02/1955, 58 y.o.   MRN: [id number]  Hypertension This is a chronic problem. The current episode started more than 1 year ago. The problem has been gradually improving since onset. The problem is controlled. Pertinent negatives include no chest pain. There are no associated agents to hypertension. There are no known risk factors for coronary artery disease. Treatments tried: amlodipine. The current treatment provides significant improvement. There are no compliance problems.    Patient had bloodwork done and results are in the system.  Patient has questions concerning CPAP machine.   Review of Systems  Constitutional: Negative for activity change, appetite change and fatigue.  HENT: Negative for congestion.   Respiratory: Negative for cough.   Cardiovascular: Negative for chest pain.  Gastrointestinal: Negative for abdominal pain.  Endocrine: Negative for polydipsia and polyphagia.  Neurological: Negative for weakness.  Psychiatric/Behavioral: Negative for confusion.       Objective:   Physical Exam  Vitals reviewed. Constitutional: He appears well-nourished. No distress.  Cardiovascular: Normal rate, regular rhythm and normal heart sounds.   No murmur heard. Pulmonary/Chest: Effort normal and breath sounds normal. No respiratory distress.  Musculoskeletal: He exhibits no edema.  Lymphadenopathy:    He has no cervical adenopathy.  Neurological: He is alert.  Psychiatric: His behavior is normal.          Assessment & Plan:  #1 HTN good control currently #2 sleep apnea patient has not used CPAP machine in several years. He has an appointment with specialist in [place] they should do a re\re titration study. #3 hyperlipidemia watch fats in diet continue pravastatin #4 ADD reinitiate medication 54 mg 1 daily he used to be on 36 mg tablets taking 2 per day but I recommended he reduce that to 54 mg once per day  follow him up in 3 months 3 prescriptions were given #5 new onset diabetes worked for her for diabetic teaching metformin twice daily followup of ongoing troubles, check A1c in 3 months Lab work was reviewed with the patient including the importance of taking his potassium Mouth fatty liver encouraged patient to watch diet exercise lose weight

## 2013-12-19 NOTE — Patient Instructions (Signed)
Diabetes Mellitus and Food It is important for you to manage your blood sugar (glucose) level. Your blood glucose level can be greatly affected by what you eat. Eating healthier foods in the appropriate amounts throughout the day at about the same time each day will help you control your blood glucose level. It can also help slow or prevent worsening of your diabetes mellitus. Healthy eating may even help you improve the level of your blood pressure and reach or maintain a healthy weight.  HOW CAN FOOD AFFECT ME? Carbohydrates Carbohydrates affect your blood glucose level more than any other type of food. Your dietitian will help you determine how many carbohydrates to eat at each meal and teach you how to count carbohydrates. Counting carbohydrates is important to keep your blood glucose at a healthy level, especially if you are using insulin or taking certain medicines for diabetes mellitus. Alcohol Alcohol can cause sudden decreases in blood glucose (hypoglycemia), especially if you use insulin or take certain medicines for diabetes mellitus. Hypoglycemia can be a life-threatening condition. Symptoms of hypoglycemia (sleepiness, dizziness, and disorientation) are similar to symptoms of having too much alcohol.  If your health care provider has given you approval to drink alcohol, do so in moderation and use the following guidelines:  Women should not have more than one drink per day, and men should not have more than two drinks per day. One drink is equal to:  12 oz of beer.  5 oz of wine.  1 oz of hard liquor.  Do not drink on an empty stomach.  Keep yourself hydrated. Have water, diet soda, or unsweetened iced tea.  Regular soda, juice, and other mixers might contain a lot of carbohydrates and should be counted. WHAT FOODS ARE NOT RECOMMENDED? As you make food choices, it is important to remember that all foods are not the same. Some foods have fewer nutrients per serving than other  foods, even though they might have the same number of calories or carbohydrates. It is difficult to get your body what it needs when you eat foods with fewer nutrients. Examples of foods that you should avoid that are high in calories and carbohydrates but low in nutrients include:  Trans fats (most processed foods list trans fats on the Nutrition Facts label).  Regular soda.  Juice.  Candy.  Sweets, such as cake, pie, doughnuts, and cookies.  Fried foods. WHAT FOODS CAN I EAT? Have nutrient-rich foods, which will nourish your body and keep you healthy. The food you should eat also will depend on several factors, including:  The calories you need.  The medicines you take.  Your weight.  Your blood glucose level.  Your blood pressure level.  Your cholesterol level. You also should eat a variety of foods, including:  Protein, such as meat, poultry, fish, tofu, nuts, and seeds (lean animal proteins are best).  Fruits.  Vegetables.  Dairy products, such as milk, cheese, and yogurt (low fat is best).  Breads, grains, pasta, cereal, rice, and beans.  Fats such as olive oil, trans fat-free margarine, canola oil, avocado, and olives. DOES EVERYONE WITH DIABETES MELLITUS HAVE THE SAME MEAL PLAN? Because every person with diabetes mellitus is different, there is not one meal plan that works for everyone. It is very important that you meet with a dietitian who will help you create a meal plan that is just right for you. Document Released: 11/18/2004 Document Revised: 02/26/2013 Document Reviewed: 01/18/2013 [employer] Patient Information 2015 [employer]. This   information is not intended to replace advice given to you by your health care provider. Make sure you discuss any questions you have with your health care provider.  

## 2013-12-20 ENCOUNTER — Other Ambulatory Visit: Payer: Self-pay | Admitting: Family Medicine

## 2013-12-21 ENCOUNTER — Other Ambulatory Visit: Payer: Self-pay | Admitting: Family Medicine

## 2014-01-05 LAB — HM DIABETES EYE EXAM

## 2014-01-20 ENCOUNTER — Other Ambulatory Visit: Payer: Self-pay | Admitting: Family Medicine

## 2014-01-20 ENCOUNTER — Ambulatory Visit (INDEPENDENT_AMBULATORY_CARE_PROVIDER_SITE_OTHER): Payer: BC Managed Care – PPO | Admitting: Pulmonary Disease

## 2014-01-20 ENCOUNTER — Encounter: Payer: Self-pay | Admitting: Pulmonary Disease

## 2014-01-20 ENCOUNTER — Telehealth: Payer: Self-pay | Admitting: Pulmonary Disease

## 2014-01-20 VITALS — BP 134/76 | HR 61 | Temp 97.5°F | Ht 66.0 in | Wt 228.4 lb

## 2014-01-20 DIAGNOSIS — G4733 Obstructive sleep apnea (adult) (pediatric): Secondary | ICD-10-CM

## 2014-01-20 NOTE — Telephone Encounter (Signed)
Error

## 2014-01-20 NOTE — Progress Notes (Signed)
Subjective:    Patient ID: Shawn [name]., male    DOB: 1955-11-25, 58 y.o.   MRN: [id number]  HPI The patient is a 58 year old male who I've been asked to see for management of obstructive sleep apnea. He was first diagnosed over 10 years ago, and started on C Pap which she quit using for unknown reasons about 3 years ago. He has very loud snoring, but his spouse has not commented on an abnormal breathing pattern during sleep. He has frequent awakenings at night, and is not rested in the mornings upon arising. Complicating all of this is the patient working rotating 12 hour shifts, which she has done so for many years. On his days off, he will fall asleep if he sits down for any period of time. He also has sleepiness issues driving longer distances. His weight has been up and down 10 pounds since his last sleep study, and his Epworth score today is 2.   Sleep Questionnaire What time do you typically go to bed?( Between what hours) 7:30 AM right now 7:30 AM right now at 1412 on 01/20/14 by Inge Rise, CMA How long does it take you to fall asleep? 1-2 hrs 1-2 hrs at 1412 on 01/20/14 by Inge Rise, CMA How many times during the night do you wake up? 3 3 at 1412 on 01/20/14 by Inge Rise, Josephine What time do you get out of bed to start your day? 0200 0200 at 1412 on 01/20/14 by Inge Rise, CMA Do you drive or operate heavy machinery in your occupation? No No at 1412 on 01/20/14 by Inge Rise, CMA How much has your weight changed (up or down) over the past two years? (In pounds) 0 oz (0 kg) 0 oz (0 kg) at 1412 on 01/20/14 by Inge Rise, CMA Have you ever had a sleep study before? Yes Yes at 1412 on 01/20/14 by Inge Rise, CMA If yes, location of study? [facility] at 1412 on 01/20/14 by Inge Rise, CMA If yes, date of study? over 10 years ago over 10 years ago at 1412 on 01/20/14 by Inge Rise, CMA Do you currently use CPAP? No No at 1412 on 01/20/14 by  Inge Rise, CMA Do you wear oxygen at any time? No No at 1412 on 01/20/14 by Inge Rise, CMA   Review of Systems  Constitutional: Negative for fever and unexpected weight change.  HENT: Negative for congestion, dental problem, ear pain, nosebleeds, postnasal drip, rhinorrhea, sinus pressure, sneezing, sore throat and trouble swallowing.   Eyes: Negative for redness and itching.  Respiratory: Negative for cough, chest tightness, shortness of breath and wheezing.   Cardiovascular: Negative for palpitations and leg swelling.  Gastrointestinal: Negative for nausea and vomiting.  Genitourinary: Negative for dysuria.  Musculoskeletal: Negative for joint swelling.  Skin: Negative for rash.  Neurological: Negative for headaches.  Hematological: Does not bruise/bleed easily.  Psychiatric/Behavioral: Negative for dysphoric mood. The patient is not nervous/anxious.        Objective:   Physical Exam Constitutional:  Obese male, no acute distress  HENT:  Nares patent without discharge, septal deviation to the left with narrowing.  Oropharynx without exudate, palate and uvula are thick and elongated  Eyes:  Perrla, eomi, no scleral icterus  Neck:  No JVD, no TMG  Cardiovascular:  Normal rate, regular rhythm, no rubs or gallops.  No murmurs        Intact  distal pulses  Pulmonary :  Normal breath sounds, no stridor or respiratory distress   No rales, rhonchi, or wheezing  Abdominal:  Soft, nondistended, bowel sounds present.  No tenderness noted.   Musculoskeletal:  minimal lower extremity edema noted.  Lymph Nodes:  No cervical lymphadenopathy noted  Skin:  No cyanosis noted  Neurologic:  Alert, appropriate, moves all 4 extremities without obvious deficit.         Assessment & Plan:

## 2014-01-20 NOTE — Assessment & Plan Note (Signed)
The patient has a history of obstructive sleep apnea for which he wore C Pap for many years. He quit wearing approximately 3 years ago for unknown reasons, and has continued to have loud snoring with frequent awakenings and nonrestorative sleep. He has no significant sleepiness issues during the day when at work, but is extremely active as a maintenance person. However, on his days off he will fall asleep after sitting down for any period of time. Complicating all of this, the patient works rotating 12 hour shifts, and has done so for many years. This certainly contributes to abnormal sleepiness as well. He has not lost any significant weight since his initial diagnosis, and therefore it is very unlikely that he has resolved his sleep disordered breathing.  I would feel very comfortable restarting C Pap on the basis of his old study as long as his insurance company wouldn't allow it. However, we will need to track down his old study in order to do this. If we are unable to find, he will need to have a new sleep study.

## 2014-01-20 NOTE — Patient Instructions (Signed)
Will schedule you for a sleep study at [facility], but can cancel if we can track down your old study.  It is almost certain you still have sleep apnea now since you have not lost significant weight.  Work on [employer] reduction Will call once the results of your sleep test is available, or if we find your old study.

## 2014-01-21 ENCOUNTER — Other Ambulatory Visit: Payer: Self-pay | Admitting: Pulmonary Disease

## 2014-01-21 DIAGNOSIS — G4733 Obstructive sleep apnea (adult) (pediatric): Secondary | ICD-10-CM

## 2014-04-12 ENCOUNTER — Other Ambulatory Visit: Payer: Self-pay | Admitting: Cardiology

## 2014-04-17 ENCOUNTER — Telehealth: Payer: Self-pay | Admitting: Family Medicine

## 2014-04-17 MED ORDER — METHYLPHENIDATE HCL ER (OSM) 54 MG PO TBCR: 54.0000 mg | EXTENDED_RELEASE_TABLET | ORAL | Status: DC

## 2014-04-17 NOTE — Telephone Encounter (Signed)
Give 2 weeks worth schedule standard office visit the patient should know that a visit is due

## 2014-04-17 NOTE — Telephone Encounter (Signed)
Last seen 10/15

## 2014-04-17 NOTE — Telephone Encounter (Signed)
Pt is needing a refill on his concerta.

## 2014-04-17 NOTE — Telephone Encounter (Signed)
Script ready for pickup for a 2 week supply.  Pt notified that he needs appt and he can pick up the 2 week supply.

## 2014-04-18 ENCOUNTER — Other Ambulatory Visit: Payer: Self-pay | Admitting: Family Medicine

## 2014-05-02 ENCOUNTER — Other Ambulatory Visit: Payer: Self-pay | Admitting: *Deleted

## 2014-05-02 ENCOUNTER — Encounter: Payer: Self-pay | Admitting: Family Medicine

## 2014-05-02 ENCOUNTER — Ambulatory Visit (INDEPENDENT_AMBULATORY_CARE_PROVIDER_SITE_OTHER): Payer: BLUE CROSS/BLUE SHIELD | Admitting: Family Medicine

## 2014-05-02 VITALS — BP 118/74 | Ht 66.0 in | Wt 227.0 lb

## 2014-05-02 DIAGNOSIS — K760 Fatty (change of) liver, not elsewhere classified: Secondary | ICD-10-CM | POA: Diagnosis not present

## 2014-05-02 DIAGNOSIS — E119 Type 2 diabetes mellitus without complications: Secondary | ICD-10-CM | POA: Diagnosis not present

## 2014-05-02 DIAGNOSIS — F988 Other specified behavioral and emotional disorders with onset usually occurring in childhood and adolescence: Secondary | ICD-10-CM | POA: Insufficient documentation

## 2014-05-02 DIAGNOSIS — I10 Essential (primary) hypertension: Secondary | ICD-10-CM

## 2014-05-02 DIAGNOSIS — F909 Attention-deficit hyperactivity disorder, unspecified type: Secondary | ICD-10-CM | POA: Diagnosis not present

## 2014-05-02 DIAGNOSIS — E785 Hyperlipidemia, unspecified: Secondary | ICD-10-CM | POA: Diagnosis not present

## 2014-05-02 LAB — POCT GLYCOSYLATED HEMOGLOBIN (HGB A1C): HEMOGLOBIN A1C: 5.6

## 2014-05-02 MED ORDER — LISINOPRIL 40 MG PO TABS: 40.0000 mg | ORAL_TABLET | Freq: Every day | ORAL | Status: DC

## 2014-05-02 MED ORDER — METHYLPHENIDATE HCL ER (OSM) 54 MG PO TBCR: 54.0000 mg | EXTENDED_RELEASE_TABLET | ORAL | Status: DC

## 2014-05-02 MED ORDER — BUPROPION HCL ER (XL) 300 MG PO TB24: 300.0000 mg | ORAL_TABLET | Freq: Every day | ORAL | Status: DC

## 2014-05-02 MED ORDER — DIAZEPAM 10 MG PO TABS: ORAL_TABLET | ORAL | Status: DC

## 2014-05-02 MED ORDER — VENLAFAXINE HCL ER 150 MG PO CP24: 150.0000 mg | ORAL_CAPSULE | Freq: Two times a day (BID) | ORAL | Status: DC

## 2014-05-02 MED ORDER — METOPROLOL SUCCINATE ER 50 MG PO TB24: ORAL_TABLET | ORAL | Status: DC

## 2014-05-02 MED ORDER — POTASSIUM CHLORIDE CRYS ER 20 MEQ PO TBCR: EXTENDED_RELEASE_TABLET | ORAL | Status: DC

## 2014-05-02 MED ORDER — METFORMIN HCL 500 MG PO TABS: 500.0000 mg | ORAL_TABLET | Freq: Two times a day (BID) | ORAL | Status: DC

## 2014-05-02 MED ORDER — AMLODIPINE BESYLATE 10 MG PO TABS: 10.0000 mg | ORAL_TABLET | Freq: Every day | ORAL | Status: DC

## 2014-05-02 MED ORDER — PANTOPRAZOLE SODIUM 40 MG PO TBEC: 40.0000 mg | DELAYED_RELEASE_TABLET | Freq: Every day | ORAL | Status: DC

## 2014-05-02 MED ORDER — PRAVASTATIN SODIUM 80 MG PO TABS: 80.0000 mg | ORAL_TABLET | Freq: Every day | ORAL | Status: DC

## 2014-05-02 MED ORDER — HYDROCHLOROTHIAZIDE 25 MG PO TABS: ORAL_TABLET | ORAL | Status: DC

## 2014-05-02 MED ORDER — BLOOD GLUCOSE MONITOR KIT: PACK | Status: DC

## 2014-05-02 NOTE — Progress Notes (Signed)
   Subjective:    Patient ID: Shawn Greaves., male    DOB: 02-02-1956, 59 y.o.   MRN: [id number]  Diabetes He presents for his follow-up diabetic visit. He has type 2 diabetes mellitus. Pertinent negatives for hypoglycemia include no confusion. Pertinent negatives for diabetes include no chest pain, no fatigue, no polydipsia, no polyphagia and no weakness. Current diabetic treatment includes oral agent (monotherapy) (metformin). He is compliant with treatment all of the time. He is following a diabetic diet. He never participates in exercise. He does not see a podiatrist.Eye exam is current.  pt is not checking blood sugars. Pt states he does not have a monitor to check sugars with. A1C today 5.6.  Pt states no concerns today.   patient does have a history of fatty liver. We talked about the importance of watching diet. He states his been doing fair that. Not exercising but stays active at work  Has adult ADD. Medication helps him. He needs refills on his medicine he denies any problems with the medicine  Diabetes taking metformin tolerating it well bowel movements are normal.   History hyperlipidemia , patient is taking medication tolerating it well.   Review of Systems  Constitutional: Negative for activity change, appetite change and fatigue.  HENT: Negative for congestion.   Respiratory: Negative for cough.   Cardiovascular: Negative for chest pain.  Gastrointestinal: Negative for abdominal pain.  Endocrine: Negative for polydipsia and polyphagia.  Neurological: Negative for weakness.  Psychiatric/Behavioral: Negative for confusion.       Objective:   Physical Exam  Constitutional: He appears well-nourished. No distress.  Cardiovascular: Normal rate, regular rhythm and normal heart sounds.   No murmur heard. Pulmonary/Chest: Effort normal and breath sounds normal. No respiratory distress.  Musculoskeletal: He exhibits no edema.  Lymphadenopathy:    He has no cervical  adenopathy.  Neurological: He is alert.  Psychiatric: His behavior is normal.  Vitals reviewed.         Assessment & Plan:  1. Essential hypertension, benign  Blood pressure good control continue current measures check metabolic 7 - Basic metabolic panel  2. Fatty liver  watch diet try to lose weight check liver profile on next visit  3. ADD (attention deficit disorder)  3 prescriptions given follow-up again in a proximally 3 months  4. Type 2 diabetes mellitus without complication  G0F much better continue current measures - POCT glycosylated hemoglobin (Hb A1C) - Microalbumin, urine  5. Hyperlipidemia  hyperlipidemia continue medications check lab work - Lipid panel

## 2014-05-02 NOTE — Patient Instructions (Addendum)
Diabetes Mellitus and Food It is important for you to manage your blood sugar (glucose) level. Your blood glucose level can be greatly affected by what you eat. Eating healthier foods in the appropriate amounts throughout the day at about the same time each day will help you control your blood glucose level. It can also help slow or prevent worsening of your diabetes mellitus. Healthy eating may even help you improve the level of your blood pressure and reach or maintain a healthy weight.  HOW CAN FOOD AFFECT ME? Carbohydrates Carbohydrates affect your blood glucose level more than any other type of food. Your dietitian will help you determine how many carbohydrates to eat at each meal and teach you how to count carbohydrates. Counting carbohydrates is important to keep your blood glucose at a healthy level, especially if you are using insulin or taking certain medicines for diabetes mellitus. Alcohol Alcohol can cause sudden decreases in blood glucose (hypoglycemia), especially if you use insulin or take certain medicines for diabetes mellitus. Hypoglycemia can be a life-threatening condition. Symptoms of hypoglycemia (sleepiness, dizziness, and disorientation) are similar to symptoms of having too much alcohol.  If your health care provider has given you approval to drink alcohol, do so in moderation and use the following guidelines:  Women should not have more than one drink per day, and men should not have more than two drinks per day. One drink is equal to:  12 oz of beer.  5 oz of wine.  1 oz of hard liquor.  Do not drink on an empty stomach.  Keep yourself hydrated. Have water, diet soda, or unsweetened iced tea.  Regular soda, juice, and other mixers might contain a lot of carbohydrates and should be counted. WHAT FOODS ARE NOT RECOMMENDED? As you make food choices, it is important to remember that all foods are not the same. Some foods have fewer nutrients per serving than other  foods, even though they might have the same number of calories or carbohydrates. It is difficult to get your body what it needs when you eat foods with fewer nutrients. Examples of foods that you should avoid that are high in calories and carbohydrates but low in nutrients include:  Trans fats (most processed foods list trans fats on the Nutrition Facts label).  Regular soda.  Juice.  Candy.  Sweets, such as cake, pie, doughnuts, and cookies.  Fried foods. WHAT FOODS CAN I EAT? Have nutrient-rich foods, which will nourish your body and keep you healthy. The food you should eat also will depend on several factors, including:  The calories you need.  The medicines you take.  Your weight.  Your blood glucose level.  Your blood pressure level.  Your cholesterol level. You also should eat a variety of foods, including:  Protein, such as meat, poultry, fish, tofu, nuts, and seeds (lean animal proteins are best).  Fruits.  Vegetables.  Dairy products, such as milk, cheese, and yogurt (low fat is best).  Breads, grains, pasta, cereal, rice, and beans.  Fats such as olive oil, trans fat-free margarine, canola oil, avocado, and olives. DOES EVERYONE WITH DIABETES MELLITUS HAVE THE SAME MEAL PLAN? Because every person with diabetes mellitus is different, there is not one meal plan that works for everyone. It is very important that you meet with a dietitian who will help you create a meal plan that is just right for you. Document Released: 11/18/2004 Document Revised: 02/26/2013 Document Reviewed: 01/18/2013 Exit[employer] Patient Information 2015 [employer]. This   information is not intended to replace advice given to you by your health care provider. Make sure you discuss any questions you have with your health care provider.   You have been seen today as part of a visit on ADD. The law is very strict on prescriptions for ADD.We must show that we are monitoring patient's closely.  You have been  given several prescriptions today that will cover you till your next visit. It is very important that you schedule an office visit before you run out of medications. This is your responsibility. We will not provide additional refills via phone calls. Do not lose your medication it will not be replaced. We look forward to seeing you at your next visit.

## 2014-05-12 ENCOUNTER — Other Ambulatory Visit: Payer: Self-pay | Admitting: *Deleted

## 2014-05-12 MED ORDER — METFORMIN HCL 500 MG PO TABS: 500.0000 mg | ORAL_TABLET | Freq: Two times a day (BID) | ORAL | Status: DC

## 2014-05-14 ENCOUNTER — Other Ambulatory Visit: Payer: Self-pay | Admitting: Family Medicine

## 2014-06-03 ENCOUNTER — Ambulatory Visit (INDEPENDENT_AMBULATORY_CARE_PROVIDER_SITE_OTHER): Payer: BLUE CROSS/BLUE SHIELD | Admitting: Family Medicine

## 2014-06-03 ENCOUNTER — Encounter: Payer: Self-pay | Admitting: Family Medicine

## 2014-06-03 VITALS — BP 122/80 | Ht 66.0 in | Wt 224.0 lb

## 2014-06-03 DIAGNOSIS — M1712 Unilateral primary osteoarthritis, left knee: Secondary | ICD-10-CM

## 2014-06-03 DIAGNOSIS — M792 Neuralgia and neuritis, unspecified: Secondary | ICD-10-CM | POA: Diagnosis not present

## 2014-06-03 MED ORDER — DICLOFENAC SODIUM 75 MG PO TBEC
75.0000 mg | DELAYED_RELEASE_TABLET | Freq: Two times a day (BID) | ORAL | Status: DC
Start: 2014-06-03 — End: 2015-12-31

## 2014-06-03 NOTE — Progress Notes (Signed)
   Subjective:    Patient ID: Shawn [name]., male    DOB: 11-28-1955, 59 y.o.   MRN: [id number]  HPIleft elbow pain and numbness. Started 4 -5  Weeks ago. Tried a band made for tennis elbow without relief. Happens multiple times her up today knee in the evening occasionally at night as well  Left leg pain. Started a few months ago. Hurts with certain movements done not does not get locked or does not give way.   Review of Systems Relates knee pain relates left hand numbness denies chest pain shortness breath    Objective:   Physical Exam  No loss of strength in hands reflexes good Tinel's negative left knee osteoarthritis suspected with some crepitus some swelling as well      Assessment & Plan:  Left knee osteoarthritis anti-inflammatory as directed if progressive trouble referral to orthopedics  Numbness in his hand probable carpal tunnel needs nerve conduction study in then patient will call us if he does not hear the results of this within 10 days doing the test. Keep regular follow-up office visits  May wear a brace on his wrist as necessary

## 2014-06-14 ENCOUNTER — Other Ambulatory Visit: Payer: Self-pay | Admitting: Family Medicine

## 2014-06-18 ENCOUNTER — Encounter: Payer: BLUE CROSS/BLUE SHIELD | Admitting: Neurology

## 2014-06-27 ENCOUNTER — Other Ambulatory Visit: Payer: Self-pay | Admitting: Family Medicine

## 2014-06-30 ENCOUNTER — Ambulatory Visit (INDEPENDENT_AMBULATORY_CARE_PROVIDER_SITE_OTHER): Payer: Self-pay | Admitting: Neurology

## 2014-06-30 ENCOUNTER — Ambulatory Visit (INDEPENDENT_AMBULATORY_CARE_PROVIDER_SITE_OTHER): Payer: BLUE CROSS/BLUE SHIELD | Admitting: Neurology

## 2014-06-30 DIAGNOSIS — G5602 Carpal tunnel syndrome, left upper limb: Secondary | ICD-10-CM | POA: Diagnosis not present

## 2014-06-30 NOTE — Procedures (Signed)
[facility]    Provider:  Dr Jaynee Eagles Referring Provider: [name], MD Primary Care Physician:  Sallee Lange, MD   History:  Shawn [name]. is a 59 y.o. male here as a referral from Dr. Wolfgang Phoenix for hand pain. Symptoms started a few months ago. Left hand, tingling in all the fingers. Worse with use like on a computer. Helps to reposition the arm.  No neck pain.   Summary:   Nerve Conduction Studies were performed on the left upper extremity.  The left median motor nerve showed borderline distal onset latency (4.0 ms, N<4.0) with normal F wave latency. The left Median 2nd Digit sensory was within normal limits.  The left Ulnar ADM motor nerve was within normal limits with normal F wave latency The left Ulnar 5th digit sensory nerve was within normal limits  The left radial sensory nerve was within normal limits   The left median/ulnar (palm) comparison nerve showed prolonged distal peak latency (Median Palm, 2.4 ms, N<2.2), and abnormal peak latency difference (Median Palm-Ulnar Palm, 0.6 ms, N<0.4) with a relative median delay.     EMG needle study of selected left upper extremity muscles was performed. The following muscles were normal: Deltoid, Triceps, Pronator Teres, Opponens Pollicis, First Dorsal Interosseous.   Conclusion: This is an abnormal study. There is electrophysiologic evidence of moderately-severe left Carpal Tunnel Syndrome.    Shawn Ill, MD  [facility] [address] [place], [place] [address]  Phone [phone] Fax [phone]

## 2014-06-30 NOTE — Progress Notes (Signed)
  [facility]    Provider:  Dr Jaynee Eagles Referring Provider: [name], MD Primary Care Physician:  Sallee Lange, MD   History:  Shawn [name]. is a 59 y.o. male here as a referral from Dr. Wolfgang Phoenix for hand pain. Symptoms started a few months ago. Left hand, tingling in all the fingers. Worse with use like on a computer. Helps to reposition the arm.  No neck pain.   Summary:   Nerve Conduction Studies were performed on the left upper extremity.  The left median motor nerve showed borderline distal onset latency (4.0 ms, N<4.0) with normal F wave latency. The left Median 2nd Digit sensory was within normal limits.  The left Ulnar ADM motor nerve was within normal limits with normal F wave latency The left Ulnar 5th digit sensory nerve was within normal limits  The left radial sensory nerve was within normal limits   The left median/ulnar (palm) comparison nerve showed prolonged distal peak latency (Median Palm, 2.4 ms, N<2.2), and abnormal peak latency difference (Median Palm-Ulnar Palm, 0.6 ms, N<0.4) with a relative median delay.     EMG needle study of selected left upper extremity muscles was performed. The following muscles were normal: Deltoid, Triceps, Pronator Teres, Opponens Pollicis, First Dorsal Interosseous.   Conclusion: This is an abnormal study. There is electrophysiologic evidence of moderately-severe left Carpal Tunnel Syndrome.    Sarina Ill, MD  [facility] [address] [place], [place] [address]  Phone [phone] Fax [phone]

## 2014-07-02 NOTE — Progress Notes (Signed)
See procedure note.

## 2014-07-02 NOTE — Addendum Note (Signed)
Addended by: Sarina Ill B on: 07/02/2014 05:21 AM   Modules accepted: Level of Service

## 2014-08-01 ENCOUNTER — Other Ambulatory Visit: Payer: Self-pay | Admitting: Family Medicine

## 2014-08-14 ENCOUNTER — Telehealth: Payer: Self-pay | Admitting: Family Medicine

## 2014-08-14 DIAGNOSIS — E119 Type 2 diabetes mellitus without complications: Secondary | ICD-10-CM

## 2014-08-14 NOTE — Telephone Encounter (Signed)
Pt is wanting to know if you heard anything back about his carpal tunnel? From his visit with the Neurologist.   Would like a referral to Dr Dorris Fetch as well at this point for his diabetes   An a refill on his Concerta

## 2014-08-14 NOTE — Telephone Encounter (Signed)
#  1 nerve conduction study. This study shows carpal tunnel. We would recommend for the patient to be seen by orthopedics. More than likely will need carpal tunnel surgery. This could be done locally through Dr. Aline Brochure or down in [place] as well see what the patient would like to do #2 may have one refill on his Concerta but truly the patient is due for an office visit, he has been on this medicine long enough to know to schedule regular follow-ups. Therefore he may have a month but he needs to schedule follow-up in several weeks #3 may have referral to see Dr.Nida for diabetic consultation

## 2014-08-14 NOTE — Telephone Encounter (Signed)
Please call pt on his cell phone, [phone]

## 2014-08-14 NOTE — Telephone Encounter (Signed)
Last check up 2/16

## 2014-08-15 ENCOUNTER — Other Ambulatory Visit: Payer: Self-pay | Admitting: *Deleted

## 2014-08-15 MED ORDER — METHYLPHENIDATE HCL ER (OSM) 54 MG PO TBCR: 54.0000 mg | EXTENDED_RELEASE_TABLET | ORAL | Status: DC

## 2014-08-15 NOTE — Telephone Encounter (Signed)
[facility]

## 2014-08-15 NOTE — Telephone Encounter (Signed)
Discussed with pt. Order for referral to Dr.  Dorris Fetch put in. Pt to call back with the name of ortho he wants to use. concerta ready for pickup. Pt notified he needs to schedule appt for ADD.

## 2014-08-30 ENCOUNTER — Other Ambulatory Visit: Payer: Self-pay | Admitting: Family Medicine

## 2014-09-04 ENCOUNTER — Encounter: Payer: Self-pay | Admitting: Family Medicine

## 2014-10-27 ENCOUNTER — Telehealth: Payer: Self-pay | Admitting: Family Medicine

## 2014-10-27 MED ORDER — METHYLPHENIDATE HCL ER (OSM) 54 MG PO TBCR: 54.0000 mg | EXTENDED_RELEASE_TABLET | ORAL | Status: DC

## 2014-10-27 NOTE — Telephone Encounter (Signed)
May refill his Concerta he must schedule appointment for September

## 2014-10-27 NOTE — Telephone Encounter (Signed)
Pt is needing a refill on his concerta.  [facility] [place]

## 2014-10-27 NOTE — Telephone Encounter (Signed)
Called patient and informed him per Dr.Scott- may have a refill on concerta but must schedule appointment for September. Informed that prescription was ready for pick up. Patient verbalized understanding and stated that he has an appointment scheduled for September.

## 2014-11-04 ENCOUNTER — Other Ambulatory Visit: Payer: Self-pay

## 2014-11-04 MED ORDER — BUPROPION HCL ER (XL) 300 MG PO TB24: 300.0000 mg | ORAL_TABLET | Freq: Every day | ORAL | Status: DC

## 2014-11-12 ENCOUNTER — Ambulatory Visit (INDEPENDENT_AMBULATORY_CARE_PROVIDER_SITE_OTHER): Payer: BLUE CROSS/BLUE SHIELD | Admitting: Family Medicine

## 2014-11-12 ENCOUNTER — Encounter: Payer: Self-pay | Admitting: Family Medicine

## 2014-11-12 VITALS — BP 134/86 | Ht 66.0 in | Wt 225.4 lb

## 2014-11-12 DIAGNOSIS — G4733 Obstructive sleep apnea (adult) (pediatric): Secondary | ICD-10-CM | POA: Diagnosis not present

## 2014-11-12 DIAGNOSIS — F988 Other specified behavioral and emotional disorders with onset usually occurring in childhood and adolescence: Secondary | ICD-10-CM

## 2014-11-12 DIAGNOSIS — Z125 Encounter for screening for malignant neoplasm of prostate: Secondary | ICD-10-CM

## 2014-11-12 DIAGNOSIS — I10 Essential (primary) hypertension: Secondary | ICD-10-CM

## 2014-11-12 DIAGNOSIS — R739 Hyperglycemia, unspecified: Secondary | ICD-10-CM | POA: Diagnosis not present

## 2014-11-12 DIAGNOSIS — F909 Attention-deficit hyperactivity disorder, unspecified type: Secondary | ICD-10-CM | POA: Diagnosis not present

## 2014-11-12 DIAGNOSIS — R5383 Other fatigue: Secondary | ICD-10-CM | POA: Diagnosis not present

## 2014-11-12 DIAGNOSIS — E785 Hyperlipidemia, unspecified: Secondary | ICD-10-CM

## 2014-11-12 MED ORDER — METFORMIN HCL 500 MG PO TABS: 500.0000 mg | ORAL_TABLET | Freq: Two times a day (BID) | ORAL | Status: DC

## 2014-11-12 MED ORDER — HYDROCHLOROTHIAZIDE 25 MG PO TABS: ORAL_TABLET | ORAL | Status: DC

## 2014-11-12 MED ORDER — LISINOPRIL 40 MG PO TABS: 40.0000 mg | ORAL_TABLET | Freq: Every day | ORAL | Status: DC

## 2014-11-12 MED ORDER — METHYLPHENIDATE HCL ER (OSM) 54 MG PO TBCR: 54.0000 mg | EXTENDED_RELEASE_TABLET | ORAL | Status: DC

## 2014-11-12 MED ORDER — PANTOPRAZOLE SODIUM 40 MG PO TBEC: 40.0000 mg | DELAYED_RELEASE_TABLET | Freq: Every day | ORAL | Status: DC

## 2014-11-12 MED ORDER — BUPROPION HCL ER (XL) 300 MG PO TB24: 300.0000 mg | ORAL_TABLET | Freq: Every day | ORAL | Status: DC

## 2014-11-12 MED ORDER — POTASSIUM CHLORIDE CRYS ER 20 MEQ PO TBCR: EXTENDED_RELEASE_TABLET | ORAL | Status: DC

## 2014-11-12 MED ORDER — METOPROLOL SUCCINATE ER 50 MG PO TB24: ORAL_TABLET | ORAL | Status: DC

## 2014-11-12 MED ORDER — AMLODIPINE BESYLATE 10 MG PO TABS: 10.0000 mg | ORAL_TABLET | Freq: Every day | ORAL | Status: DC

## 2014-11-12 MED ORDER — PRAVASTATIN SODIUM 80 MG PO TABS: 80.0000 mg | ORAL_TABLET | Freq: Every day | ORAL | Status: DC

## 2014-11-12 NOTE — Progress Notes (Signed)
   Subjective:    Patient ID: Shawn Greaves., male    DOB: 12/27/1955, 59 y.o.   MRN: [id number]  Hypertension This is a chronic problem. The current episode started more than 1 year ago. Pertinent negatives include no chest pain. Risk factors for coronary artery disease include dyslipidemia and male gender. Treatments tried: metoprolol, norvasc. There are no compliance problems.   Hyperlipidemia This is a chronic problem. The current episode started more than 1 year ago. The problem is controlled. Pertinent negatives include no chest pain.  Diabetes Diabetes type: Has prediabetes. Pertinent negatives for diabetes include no chest pain, no fatigue, no polydipsia and no polyphagia. Symptoms are stable. Risk factors for coronary artery disease include diabetes mellitus and dyslipidemia.  ADD- he feels it helps but not as much as the previous dose He finds he still suffers  Patient needs refill of ADHD med and still having problems with sleeping too much -wants to sleep all the time on days off  Review of Systems  Constitutional: Negative for activity change, appetite change and fatigue.  HENT: Negative for congestion.   Respiratory: Negative for cough.   Cardiovascular: Negative for chest pain.  Gastrointestinal: Negative for abdominal pain.  Endocrine: Negative for polydipsia and polyphagia.       Objective:   Physical Exam  Constitutional: He appears well-nourished. No distress.  Cardiovascular: Normal rate, regular rhythm and normal heart sounds.   No murmur heard. Pulmonary/Chest: Effort normal and breath sounds normal. No respiratory distress.  Musculoskeletal: He exhibits no edema.  Lymphadenopathy:    He has no cervical adenopathy.  Neurological: He is alert.  Psychiatric: His behavior is normal.  Vitals reviewed.         Assessment & Plan:  1. Essential hypertension, benign Patient is doing a fairly decent job of blood pressure he needs to increase the activity  and exercise watch diet try to bring his weight down - Hepatic function panel - Basic metabolic panel - Hemoglobin A1c  2. Obstructive sleep apnea I encouraged patient to use CPAP machine he has not been using it lately. He does have sleep apnea.  3. ADD (attention deficit disorder) The medication does help him focus and helps him with alertness I don't recommend increasing the dose. Follow-up 3 months  4. Hyperlipidemia Cholesterol decent control the past continue current medication watch diet closely check lab work - Lipid panel - Hemoglobin A1c  5. Other fatigue Significant fatigue and tiredness patient having a difficult time I think he would benefit from using his CPAP machine exercising losing weight watching diet check labs - CBC with Differential/Platelet - TSH - T4, free - Hemoglobin A1c  6. Special screening for malignant neoplasm of prostate Check labs for screening of PSA - PSA

## 2014-11-12 NOTE — Patient Instructions (Signed)
I would rewcommend that you reduce your Valium dose from 10 mg to 5 mg no more than 3 times =a day, that should help your daytime fatigue Also restart your CPAP

## 2014-11-13 LAB — CBC WITH DIFFERENTIAL/PLATELET
BASOS ABS: 0.1 10*3/uL (ref 0.0–0.2)
BASOS: 1 %
EOS (ABSOLUTE): 0.3 10*3/uL (ref 0.0–0.4)
Eos: 4 %
Hematocrit: 47.6 % (ref 37.5–51.0)
Hemoglobin: 16.3 g/dL (ref 12.6–17.7)
IMMATURE GRANS (ABS): 0 10*3/uL (ref 0.0–0.1)
Immature Granulocytes: 0 %
LYMPHS: 34 %
Lymphocytes Absolute: 2 10*3/uL (ref 0.7–3.1)
MCH: 30.5 pg (ref 26.6–33.0)
MCHC: 34.2 g/dL (ref 31.5–35.7)
MCV: 89 fL (ref 79–97)
MONOS ABS: 0.6 10*3/uL (ref 0.1–0.9)
Monocytes: 10 %
NEUTROS ABS: 3 10*3/uL (ref 1.4–7.0)
Neutrophils: 51 %
PLATELETS: 142 10*3/uL — AB (ref 150–379)
RBC: 5.34 x10E6/uL (ref 4.14–5.80)
RDW: 14.5 % (ref 12.3–15.4)
WBC: 5.9 10*3/uL (ref 3.4–10.8)

## 2014-11-13 LAB — BASIC METABOLIC PANEL
BUN / CREAT RATIO: 10 (ref 9–20)
BUN: 11 mg/dL (ref 6–24)
CO2: 29 mmol/L (ref 18–29)
CREATININE: 1.11 mg/dL (ref 0.76–1.27)
Calcium: 9.3 mg/dL (ref 8.7–10.2)
Chloride: 98 mmol/L (ref 97–108)
GFR, EST AFRICAN AMERICAN: 85 mL/min/{1.73_m2} (ref >59)
GFR, EST NON AFRICAN AMERICAN: 73 mL/min/{1.73_m2} (ref >59)
Glucose: 135 mg/dL — ABNORMAL HIGH (ref 65–99)
POTASSIUM: 3.2 mmol/L — AB (ref 3.5–5.2)
SODIUM: 142 mmol/L (ref 134–144)

## 2014-11-13 LAB — LIPID PANEL
CHOLESTEROL TOTAL: 167 mg/dL (ref 100–199)
Chol/HDL Ratio: 4.8 ratio units (ref 0.0–5.0)
HDL: 35 mg/dL — ABNORMAL LOW (ref >39)
LDL Calculated: 107 mg/dL — ABNORMAL HIGH (ref 0–99)
Triglycerides: 125 mg/dL (ref 0–149)
VLDL CHOLESTEROL CAL: 25 mg/dL (ref 5–40)

## 2014-11-13 LAB — T4, FREE: Free T4: 0.92 ng/dL (ref 0.82–1.77)

## 2014-11-13 LAB — HEPATIC FUNCTION PANEL
ALBUMIN: 4.4 g/dL (ref 3.5–5.5)
ALK PHOS: 80 IU/L (ref 39–117)
ALT: 48 IU/L — ABNORMAL HIGH (ref 0–44)
AST: 46 IU/L — AB (ref 0–40)
BILIRUBIN TOTAL: 0.9 mg/dL (ref 0.0–1.2)
BILIRUBIN, DIRECT: 0.27 mg/dL (ref 0.00–0.40)
TOTAL PROTEIN: 6.9 g/dL (ref 6.0–8.5)

## 2014-11-13 LAB — PSA: PROSTATE SPECIFIC AG, SERUM: 0.5 ng/mL (ref 0.0–4.0)

## 2014-11-13 LAB — HEMOGLOBIN A1C
ESTIMATED AVERAGE GLUCOSE: 151 mg/dL
HEMOGLOBIN A1C: 6.9 % — AB (ref 4.8–5.6)

## 2014-11-13 LAB — TSH: TSH: 1.22 u[IU]/mL (ref 0.450–4.500)

## 2014-11-17 ENCOUNTER — Other Ambulatory Visit: Payer: Self-pay

## 2014-11-17 DIAGNOSIS — R748 Abnormal levels of other serum enzymes: Secondary | ICD-10-CM

## 2014-11-17 DIAGNOSIS — Z79899 Other long term (current) drug therapy: Secondary | ICD-10-CM

## 2014-11-17 MED ORDER — HYDROCHLOROTHIAZIDE 12.5 MG PO TABS: 12.5000 mg | ORAL_TABLET | Freq: Every day | ORAL | Status: DC

## 2014-11-17 MED ORDER — METFORMIN HCL 500 MG PO TABS: ORAL_TABLET | ORAL | Status: DC

## 2014-11-20 ENCOUNTER — Ambulatory Visit (HOSPITAL_COMMUNITY)
Admission: RE | Admit: 2014-11-20 | Discharge: 2014-11-20 | Disposition: A | Discharge status: Home / Self Care | Payer: BLUE CROSS/BLUE SHIELD | Source: Ambulatory Visit | Attending: Family Medicine | Admitting: Family Medicine

## 2014-11-20 DIAGNOSIS — R748 Abnormal levels of other serum enzymes: Secondary | ICD-10-CM | POA: Diagnosis present

## 2014-11-20 DIAGNOSIS — R1011 Right upper quadrant pain: Secondary | ICD-10-CM | POA: Diagnosis not present

## 2014-11-24 ENCOUNTER — Other Ambulatory Visit: Payer: Self-pay

## 2014-11-24 DIAGNOSIS — K760 Fatty (change of) liver, not elsewhere classified: Secondary | ICD-10-CM

## 2014-11-26 ENCOUNTER — Encounter: Payer: Self-pay | Admitting: Internal Medicine

## 2014-12-11 ENCOUNTER — Encounter: Payer: Self-pay | Admitting: Nurse Practitioner

## 2014-12-11 ENCOUNTER — Ambulatory Visit (INDEPENDENT_AMBULATORY_CARE_PROVIDER_SITE_OTHER): Payer: BLUE CROSS/BLUE SHIELD | Admitting: Nurse Practitioner

## 2014-12-11 VITALS — BP 140/88 | HR 58 | Temp 98.1°F | Ht 65.0 in | Wt 232.6 lb

## 2014-12-11 DIAGNOSIS — R7989 Other specified abnormal findings of blood chemistry: Secondary | ICD-10-CM

## 2014-12-11 DIAGNOSIS — R935 Abnormal findings on diagnostic imaging of other abdominal regions, including retroperitoneum: Secondary | ICD-10-CM

## 2014-12-11 DIAGNOSIS — R945 Abnormal results of liver function studies: Principal | ICD-10-CM

## 2014-12-11 NOTE — Progress Notes (Signed)
Primary Care Physician:  Sallee Lange, MD Primary Gastroenterologist:  Dr. Gala Romney  Chief Complaint  Patient presents with  . Fatty liver    HPI:   59 year old male referred by primary care for fatty liver. PCP notes reviewed. Last PCP office visit 11/12/2014 where LFTs came back elevated. On that day hemoglobin A1c was also 6.9. Platelets slightly low at 142. AST/ALT 46/48, all other liver parameters normal. Follow-up abdominal ultrasound completed 11/20/2014 which showed liver with a nodular contour on a heterogeneous parenchymal echotexture suggesting cirrhosis. No focal liver lesions noted.  Today he states he has not had a colonoscopy, isn't ready yet "I've had too much going on lately I just haven't done it." Has surgery 10/17 scheduled for carpel tunnel. Denies abdominal pain, N/V, hematochezia, melena, abdominal swelling. Has bilateral peripheral edema typically after 12 hour shift on his feet. Denies yellowing of skin/eyes, darkened urine, acute confusion. Denies fever, chills, unintentional weight loss. Denies chest pain, dyspnea, dizziness, lightheadedness, syncope, near syncope. Denies any other upper or lower GI symptoms.  He has a single tattoo left deltoid he received 7-8 years ago, no history of IV drug use, never received a blood transfusion. Is in the Hep C high risk birth cohort [phone]).   Past Medical History  Diagnosis Date  . Essential hypertension, benign     Long-standing history, negative secondary workup  . Type 2 diabetes mellitus ([place])   . Anxiety   . Attention deficit disorder   . Depression   . Obstructive sleep apnea   . TIA (transient ischemic attack)     Possible, 2007  . History of cardiac catheterization     Widely patent coronary and renal arteries November 2013    Past Surgical History  Procedure Laterality Date  . Neck surgery    . Vasectomy    . Knee surgery Right     Current Outpatient Prescriptions  Medication Sig Dispense Refill    . amLODipine (NORVASC) 10 MG tablet Take 1 tablet (10 mg total) by mouth daily. 90 tablet 3  . aspirin 81 MG tablet Take 81 mg by mouth daily.    . blood glucose meter kit and supplies KIT Dispense based on patient and insurance preference. Use daily as directed. (FOR ICD - 10 E11.9) 1 each 5  . buPROPion (WELLBUTRIN XL) 300 MG 24 hr tablet Take 1 tablet (300 mg total) by mouth daily. 90 tablet 1  . diazepam (VALIUM) 10 MG tablet TAKE 1 TABLET 3 TIMES A DAY AS NEEDED 90 tablet 5  . diclofenac (VOLTAREN) 75 MG EC tablet Take 1 tablet (75 mg total) by mouth 2 (two) times daily. 60 tablet 2  . hydrochlorothiazide (HYDRODIURIL) 12.5 MG tablet Take 1 tablet (12.5 mg total) by mouth daily. 90 tablet 1  . lisinopril (PRINIVIL,ZESTRIL) 40 MG tablet Take 1 tablet (40 mg total) by mouth daily. 90 tablet 3  . metFORMIN (GLUCOPHAGE) 500 MG tablet Take 1 tablet po in am and 2 tablets po in the evening 270 tablet 1  . methylphenidate (CONCERTA) 54 MG PO CR tablet Take 1 tablet (54 mg total) by mouth every morning. 30 tablet 0  . metoprolol succinate (TOPROL-XL) 50 MG 24 hr tablet TAKE 1 TABLET BY MOUTH DAILY. TAKE WITH OR IMMEDIATELY FOLLOWING A MEAL. 90 tablet 3  . pantoprazole (PROTONIX) 40 MG tablet Take 1 tablet (40 mg total) by mouth daily. 90 tablet 3  . potassium chloride SA (KLOR-CON M20) 20 MEQ tablet TAKE 2 TABLETS  EACH MORNING 180 tablet 3  . pravastatin (PRAVACHOL) 80 MG tablet Take 1 tablet (80 mg total) by mouth daily. 90 tablet 3  . venlafaxine XR (EFFEXOR-XR) 150 MG 24 hr capsule Take 1 capsule (150 mg total) by mouth 2 (two) times daily. 180 capsule 1   No current facility-administered medications for this visit.    Allergies as of 12/11/2014  . (No Known Allergies)    Family History  Problem Relation Age of Onset  . Colon cancer Mother   . Hypertension Mother   . CAD Mother   . Breast cancer Mother   . Liver disease Neg Hx     Social History   Social History  . Marital  Status: Married    Spouse Name: N/A  . Number of Children: 4  . Years of Education: N/A   Occupational History  . maintainer     Social History Main Topics  . Smoking status: Never Smoker   . Smokeless tobacco: Current User    Types: Snuff     Comment: Uses dip pouches  . Alcohol Use: 0.0 oz/week    0 Standard drinks or equivalent per week     Comment: Occasional: 1 drink once a month past few years; Previously 1-2 cases of beer/week.  . Drug Use: No  . Sexual Activity: Not on file   Other Topics Concern  . Not on file   Social History Narrative    Review of Systems: General: Negative for anorexia, weight loss, fever, chills, fatigue, weakness. Eyes: Negative for vision changes.  ENT: Negative for hoarseness, difficulty swallowing. CV: Negative for chest pain, angina, palpitations, peripheral edema.  Respiratory: Negative for dyspnea at rest, cough, sputum, wheezing.  GI: See history of present illness. Derm: Negative for rash or itching.  Neuro: Negative for weakness, memory loss, confusion.  Endo: Negative for unusual weight change.  Heme: Negative for bruising or bleeding.    Physical Exam: BP 140/88 mmHg  Pulse 58  Temp(Src) 98.1 F (36.7 C) (Oral)  Ht 5' 5"  (1.651 m)  Wt 232 lb 9.6 oz (105.507 kg)  BMI 38.71 kg/m2 General:   Alert and oriented. Pleasant and cooperative. Well-nourished and well-developed. Apparent delayed processing time with history of TIA. Head:  Normocephalic and atraumatic. Eyes:  Without icterus, sclera clear and conjunctiva pink.  Ears:  Normal auditory acuity. Cardiovascular:  S1, S2 present without murmurs appreciated. Extremities without clubbing. Mild 1+ pitting bilateral LE edema at the ankles. Respiratory:  Clear to auscultation bilaterally. No wheezes, rales, or rhonchi. No distress.  Gastrointestinal:  +BS, large but soft, non-tender. No tense ascites noted. No sppreciated splenomegaly. {issible hepatomegaly noted 2  fingerbreadths below the right costal margin although limited due to abdominal girth. No guarding or rebound. No masses appreciated.  Rectal:  Deferred  Skin:  Intact without significant lesions or rashes. Neurologic:  Alert and oriented x4;  grossly normal neurologically. Psych:  Alert and cooperative. Normal mood and affect. Heme/Lymph/Immune: No excessive bruising noted.    12/11/2014 8:29 AM

## 2014-12-11 NOTE — Assessment & Plan Note (Signed)
Patient with abdominal ultrasound suggesting possible cirrhosis in the setting of subtly abnormal LFTs and slightly low platelet count 142. Imaging and laboratory workup as noted above. Most likely etiology due to fatty liver. The patient does have hypertension, type 2 diabetes. Return for follow-up in 8 weeks to review results and make further plans.

## 2014-12-11 NOTE — Assessment & Plan Note (Signed)
Patient with mildly elevated LFTs as noted per history of present illness. Abdominal ultrasound concerning for possible cirrhosis. He is generally asymptomatic from a GI standpoint. Does have some mild lower extremity bilateral edema in the ankles although this is mostly after working 12 hour shifts on his feet. This point we will work him up for cirrhosis including ultrasound elastography to confirm cirrhosis, labs including hepatic function panel, hepatitis C antibody with reflex to RNA, iron, ferritin, antimicrosomal antibody of kidney/liver, ANA, anti-smooth muscle antibody, PTT/INR. We will have him return for review of labs and imaging in 8 weeks to discuss further plans.

## 2014-12-11 NOTE — Patient Instructions (Addendum)
1. Have your labs drawn when you're able to. 2. We will be schedule your ultrasound. Return for follow-up in 8 weeks. We will discuss initial colonoscopy at that time.   Fatty Liver Fatty liver, also called hepatic steatosis or steatohepatitis, is a condition in which too much fat has built up in your liver cells. The liver removes harmful substances from your bloodstream. It produces fluids your body needs. It also helps your body use and store energy from the food you eat. In many cases, fatty liver does not cause symptoms or problems. It is often diagnosed when tests are being done for other reasons. However, over time, fatty liver can cause inflammation that may lead to more serious liver problems, such as scarring of the liver (cirrhosis). CAUSES  Causes of fatty liver may include:   Drinking too much alcohol.  Poor nutrition.  Obesity.  Cushing syndrome.  Diabetes.  Hyperlipidemia.  Pregnancy.  Certain drugs.  Poisons.  Some viral infections. RISK FACTORS You may be more likely to develop fatty liver if you:  Abuse alcohol.  Are pregnant.  Are overweight.  Have diabetes.  Have hepatitis.  Have a high triglyceride level.  SIGNS AND SYMPTOMS  Fatty liver often does not cause any symptoms. In cases where symptoms develop, they can include:  Fatigue.  Weakness.  Weight loss.  Confusion.   Abdominal pain.  Yellowing of your skin and the white parts of your eyes (jaundice).  Nausea and vomiting. DIAGNOSIS  Fatty liver may be diagnosed by:   Physical exam and medical history.  Blood tests.  Imaging tests, such as an ultrasound, CT scan, or MRI.  Liver biopsy. A small sample of liver tissue is removed using a needle. The sample is then looked at under a microscope. TREATMENT  Fatty liver is often caused by other health conditions. Treatment for fatty liver may involve medicines and lifestyle changes to manage conditions such as:    Alcoholism.  High cholesterol.  Diabetes.  Being overweight or obese.  HOME CARE INSTRUCTIONS  Eat a healthy diet as directed by your health care provider.  Exercise regularly. This can help you lose weight and control your cholesterol and diabetes. Talk to your health care provider about an exercise plan and which activities are best for you.  Do not drink alcohol.   Take medicines only as directed by your health care provider. SEEK MEDICAL CARE IF: You have difficulty controlling your:  Blood sugar.  Cholesterol.  Alcohol consumption. SEEK IMMEDIATE MEDICAL CARE IF:  You have abdominal pain.  You have jaundice.  You have nausea and vomiting.   This information is not intended to replace advice given to you by your health care provider. Make sure you discuss any questions you have with your health care provider.   Document Released: 04/08/2005 Document Revised: 03/14/2014 Document Reviewed: 07/03/2013 Elsevier Interactive Patient Education 2016 [employer]   Nonalcoholic Fatty Liver Disease Diet Nonalcoholic fatty liver disease is a condition that causes fat to accumulate in and around the liver. The disease makes it harder for the liver to work the way that it should. Following a healthy diet can help to keep nonalcoholic fatty liver disease under control. It can also help to prevent or improve conditions that are associated with the disease, such as heart disease, diabetes, high blood pressure, and abnormal cholesterol levels. Along with regular exercise, this diet:  Promotes weight loss.  Helps to control blood sugar levels.  Helps to improve the way that  the body uses insulin. WHAT DO I NEED TO KNOW ABOUT THIS DIET?  Use the glycemic index (GI) to plan your meals. The index tells you how quickly a food will raise your blood sugar. Choose low-GI foods. These foods take a longer time to raise blood sugar.  Keep track of how many calories you take in.  Eating the right amount of calories will help you to achieve a healthy weight.  You may want to follow a Mediterranean diet. This diet includes a lot of vegetables, lean meats or fish, whole grains, fruits, and healthy oils and fats. WHAT FOODS CAN I EAT? Grains Whole grains, such as whole-wheat or whole-grain breads, crackers, tortillas, cereals, and pasta. Stone-ground whole wheat. Pumpernickel bread. Unsweetened oatmeal. Bulgur. Barley. Quinoa. Brown or wild rice. Corn or whole-wheat flour tortillas. Vegetables Lettuce. Spinach. Peas. Beets. Cauliflower. Cabbage. Broccoli. Carrots. Tomatoes. Squash. Eggplant. Herbs. Peppers. Onions. Cucumbers. Brussels sprouts. Yams and sweet potatoes. Beans. Lentils. Fruits Bananas. Apples. Oranges. Grapes. Papaya. Mango. Pomegranate. Kiwi. Grapefruit. Cherries. Meats and Other Protein Sources Seafood and shellfish. Lean meats. Poultry. Tofu. Dairy Low-fat or fat-free dairy products, such as yogurt, cottage cheese, and cheese. Beverages Water. Sugar-free drinks. Tea. Coffee. Low-fat or skim milk. Milk alternatives, such as soy or almond milk. Real fruit juice. Condiments Mustard. Relish. Low-fat, low-sugar ketchup and barbecue sauce. Low-fat or fat-free mayonnaise. Sweets and Desserts Sugar-free sweets. Fats and Oils Avocado. Canola or olive oil. Nuts and nut butters. Seeds. The items listed above may not be a complete list of recommended foods or beverages. Contact your dietitian for more options.  WHAT FOODS ARE NOT RECOMMENDED? Palm oil and coconut oil. Processed foods. Fried foods. Sweetened drinks, such as sweet tea, milkshakes, snow cones, iced sweet drinks, and sodas. Alcohol. Sweets. Foods that contain a lot of salt or sodium. The items listed above may not be a complete list of foods and beverages to avoid. Contact your dietitian for more information.   This information is not intended to replace advice given to you by your health care  provider. Make sure you discuss any questions you have with your health care provider.   Document Released: 07/08/2014 Document Reviewed: 07/08/2014 Elsevier Interactive Patient Education 2016 [employer].   Cirrhosis Cirrhosis is long-term (chronic) liver injury. The liver is your largest internal organ, and it performs many functions. The liver converts food into energy, removes toxic material from your blood, makes important proteins, and absorbs necessary vitamins from your diet. If you have cirrhosis, it means many of your healthy liver cells have been replaced by scar tissue. This prevents blood from flowing through your liver, which makes it difficult for your liver to function. This scarring is not reversible, but treatment can prevent it from getting worse.  CAUSES  Hepatitis C and long-term alcohol abuse are the most common causes of cirrhosis. Other causes include:  Nonalcoholic fatty liver disease.  Hepatitis B infection.  Autoimmune hepatitis.  Diseases that cause blockage of ducts inside the liver.  Inherited liver diseases.  Reactions to certain long-term medicines.  Parasitic infections.  Long-term exposure to certain toxins. RISK FACTORS You may have a higher risk of cirrhosis if you:  Have certain hepatitis viruses.  Abuse alcohol, especially if you are male.  Are overweight.  Share needles.  Have unprotected sex with someone who has hepatitis. SYMPTOMS  You may not have any signs and symptoms at first. Symptoms may not develop until the damage to your liver starts to get worse. Signs  and symptoms of cirrhosis may include:   Tenderness in the right-upper part of your abdomen.  Weakness and tiredness (fatigue).  Loss of appetite.  Nausea.  Weight loss and muscle loss.  Itchiness.  Yellow skin and eyes (jaundice).  Buildup of fluid in the abdomen (ascites).  Swelling of the feet and ankles (edema).  Appearance of tiny blood vessels under  the skin.  Mental confusion.  Easy bruising and bleeding. DIAGNOSIS  Your health care provider may suspect cirrhosis based on your symptoms and medical history, especially if you have other medical conditions or a history of alcohol abuse. Your health care provider will do a physical exam to feel your liver and check for signs of cirrhosis. Your health care provider may perform other tests, including:   Blood tests to check:   Whether you have hepatitis B or C.   Kidney function.  Liver function.  Imaging tests such as:  MRI or CT scan to look for changes seen in advanced cirrhosis.  Ultrasound to see if normal liver tissue is being replaced by scar tissue.  A procedure using a long needle to take a sample of liver tissue (biopsy) for examination under a microscope. Liver biopsy can confirm the diagnosis of cirrhosis.  TREATMENT  Treatment depends on how damaged your liver is and what caused the damage. Treatment may include treating cirrhosis symptoms or treating the underlying causes of the condition to try to slow the progression of the damage. Treatment may include:  Making lifestyle changes, such as:   Eating a healthy diet.  Restricting salt intake.  Maintaining a healthy weight.   Not abusing drugs or alcohol.  Taking medicines to:  Treat liver infections or other infections.  Control itching.  Reduce fluid buildup.  Reduce certain blood toxins.  Reduce risk of bleeding from enlarged blood vessels in the stomach or esophagus (varices).  If varices are causing bleeding problems, you may need treatment with a procedure that ties up the vessels causing them to fall off (band ligation).  If cirrhosis is causing your liver to fail, your health care provider may recommend a liver transplant.  Other treatments may be recommended depending on any complications of cirrhosis, such as liver-related kidney failure (hepatorenal syndrome). HOME CARE INSTRUCTIONS    Take medicines only as directed by your health care provider. Do not use drugs that are toxic to your liver. Ask your health care provider before taking any new medicines, including over-the-counter medicines.   Rest as needed.  Eat a well-balanced diet. Ask your health care provider or dietitian for more information.   You may have to follow a low-salt diet or restrict your water intake as directed.  Do not drink alcohol. This is especially important if you are taking acetaminophen.  Keep all follow-up visits as directed by your health care provider. This is important. SEEK MEDICAL CARE IF:  You have fatigue or weakness that is getting worse.  You develop swelling of the hands, feet, legs, or face.  You have a fever.  You develop loss of appetite.  You have nausea or vomiting.  You develop jaundice.  You develop easy bruising or bleeding. SEEK IMMEDIATE MEDICAL CARE IF:  You vomit bright red blood or a material that looks like coffee grounds.  You have blood in your stools.  Your stools appear black and tarry.  You become confused.  You have chest pain or trouble breathing.   This information is not intended to replace advice given to  you by your health care provider. Make sure you discuss any questions you have with your health care provider.   Document Released: 02/21/2005 Document Revised: 03/14/2014 Document Reviewed: 10/30/2013 Elsevier Interactive Patient Education [employer].

## 2014-12-11 NOTE — Progress Notes (Signed)
cc'ed to pcp °

## 2014-12-18 ENCOUNTER — Other Ambulatory Visit: Payer: Self-pay | Admitting: Internal Medicine

## 2014-12-19 LAB — HEPATIC FUNCTION PANEL
ALBUMIN: 4.2 g/dL (ref 3.5–5.5)
ALT: 52 IU/L — AB (ref 0–44)
AST: 44 IU/L — AB (ref 0–40)
Alkaline Phosphatase: 90 IU/L (ref 39–117)
BILIRUBIN TOTAL: 0.9 mg/dL (ref 0.0–1.2)
BILIRUBIN, DIRECT: 0.27 mg/dL (ref 0.00–0.40)
Total Protein: 7 g/dL (ref 6.0–8.5)

## 2014-12-19 LAB — FERRITIN: Ferritin: 96 ng/mL (ref 30–400)

## 2014-12-19 LAB — ANTI-MICROSOMAL ANTIBODY LIVER / KIDNEY: LKM1 AB: 3.7 U (ref 0.0–20.0)

## 2014-12-19 LAB — ANTI-SMOOTH MUSCLE ANTIBODY, IGG: SMOOTH MUSCLE AB: 19 U (ref 0–19)

## 2014-12-19 LAB — IRON AND TIBC
Iron Saturation: 33 % (ref 15–55)
Iron: 125 ug/dL (ref 38–169)
TIBC: 374 ug/dL (ref 250–450)
UIBC: 249 ug/dL (ref 111–343)

## 2014-12-19 LAB — ANA: Anti Nuclear Antibody(ANA): POSITIVE — AB

## 2014-12-20 LAB — HCV RNA QUANT RFLX ULTRA OR GENOTYP: HCV Quant Baseline: NOT DETECTED IU/mL

## 2014-12-22 ENCOUNTER — Telehealth: Payer: Self-pay

## 2014-12-22 NOTE — Telephone Encounter (Signed)
[employer] results are back and will be in EG box.

## 2014-12-23 ENCOUNTER — Ambulatory Visit (HOSPITAL_COMMUNITY): Payer: BLUE CROSS/BLUE SHIELD | Attending: Nurse Practitioner

## 2015-02-06 ENCOUNTER — Other Ambulatory Visit (HOSPITAL_COMMUNITY): Payer: BLUE CROSS/BLUE SHIELD

## 2015-02-06 ENCOUNTER — Other Ambulatory Visit: Payer: Self-pay | Admitting: Nurse Practitioner

## 2015-02-06 ENCOUNTER — Ambulatory Visit (HOSPITAL_COMMUNITY)
Admission: RE | Admit: 2015-02-06 | Discharge: 2015-02-06 | Disposition: A | Discharge status: Home / Self Care | Payer: BLUE CROSS/BLUE SHIELD | Source: Ambulatory Visit | Attending: Nurse Practitioner | Admitting: Nurse Practitioner

## 2015-02-06 ENCOUNTER — Ambulatory Visit (HOSPITAL_COMMUNITY): Admission: RE | Admit: 2015-02-06 | Payer: BLUE CROSS/BLUE SHIELD | Source: Ambulatory Visit

## 2015-02-06 DIAGNOSIS — R7989 Other specified abnormal findings of blood chemistry: Secondary | ICD-10-CM | POA: Insufficient documentation

## 2015-02-06 DIAGNOSIS — K7460 Unspecified cirrhosis of liver: Secondary | ICD-10-CM | POA: Diagnosis not present

## 2015-02-06 DIAGNOSIS — R935 Abnormal findings on diagnostic imaging of other abdominal regions, including retroperitoneum: Secondary | ICD-10-CM | POA: Diagnosis not present

## 2015-02-06 DIAGNOSIS — R945 Abnormal results of liver function studies: Principal | ICD-10-CM

## 2015-02-09 ENCOUNTER — Encounter: Payer: Self-pay | Admitting: Nurse Practitioner

## 2015-02-09 ENCOUNTER — Ambulatory Visit: Payer: BLUE CROSS/BLUE SHIELD | Admitting: Nurse Practitioner

## 2015-02-09 ENCOUNTER — Telehealth: Payer: Self-pay | Admitting: Nurse Practitioner

## 2015-02-09 NOTE — Telephone Encounter (Signed)
PATIENT WAS A NO SHOW AND LETTER SENT  °

## 2015-02-10 NOTE — Telephone Encounter (Signed)
Noted  

## 2015-02-11 ENCOUNTER — Ambulatory Visit: Payer: BLUE CROSS/BLUE SHIELD | Admitting: Family Medicine

## 2015-02-11 DIAGNOSIS — Z029 Encounter for administrative examinations, unspecified: Secondary | ICD-10-CM

## 2015-02-12 ENCOUNTER — Ambulatory Visit (INDEPENDENT_AMBULATORY_CARE_PROVIDER_SITE_OTHER): Payer: BLUE CROSS/BLUE SHIELD | Admitting: Nurse Practitioner

## 2015-02-12 ENCOUNTER — Encounter: Payer: Self-pay | Admitting: Nurse Practitioner

## 2015-02-12 VITALS — BP 142/97 | HR 64 | Temp 97.2°F | Ht 66.0 in | Wt 225.2 lb

## 2015-02-12 DIAGNOSIS — K7460 Unspecified cirrhosis of liver: Secondary | ICD-10-CM

## 2015-02-12 NOTE — Assessment & Plan Note (Signed)
Patient with newly diagnosed cirrhosis as of last office visit and appears to have well compensated disease based on labs. PT and INR was not completed at last visit. Hep C antibody negative, ANA positive but likely aberrant result given all other autoimmune labs normal. Very mild elevations in AST/ALT, other liver parameters normal. States today he forgot to tell me last time that his brother also has cirrhosis and has done quite well since changing his diet. At this point we'll order labs including CBC, CMP, PTT/INR. We'll have him referred to nutritionist for dietary counseling for glycemic control to prevent worsening fatty liver and cirrhosis (per the patient's request) is. We'll have him follow-up for 6 months routine cirrhosis care including imaging and further labs. We'll update meld score when labs have returned.

## 2015-02-12 NOTE — Progress Notes (Signed)
CC'ED TO PCP 

## 2015-02-12 NOTE — Progress Notes (Addendum)
Referring Provider: [name], MD Primary Care Physician:  Sallee Lange, MD Primary GI:  Dr. Gala Romney  Chief Complaint  Patient presents with  . elevated LFT  . Follow-up    HPI:   59 year old male presents for follow-up on newly diagnosed cirrhosis per imaging and elevated LFTs. At last office visit on 12/11/2014 labs are ordered. LFTs remained slightly elevated at AST/ALT of 44/52, all other liver parameters normal. Negative for hep C. Iron, TIBC, ferritin, anti-smooth muscle antibody, and antimicrosomal of antibody kidney and liver were all normal. ANA was positive. Right upper quadrant ultrasound elastography found sludge in the gallbladder, normal gallbladder wall thickness, normal bile duct diameter, scalloped contour of the liver with heterogenous echotexture suggesting cirrhosis. No liver lesions noted. Elastography found some F3 and F4.  Today he states his half brother has cirrhosis and was given a year to live about 6 years ago at which point he went on a diet and is currently doing well. He states in 2013 he had minimal scarring on CT after an accident. Denies yellowing of skin/eyes, darkened urine, episodes of acute confusion, excessive bleeding/bruising, hematochezia, and melena. Denies chest pain, dyspnea, dizziness, lightheadedness, syncope, near syncope. Denies any other upper or lower GI symptoms.   Past Medical History  Diagnosis Date  . Essential hypertension, benign     Long-standing history, negative secondary workup  . Type 2 diabetes mellitus ([place])   . Anxiety   . Attention deficit disorder   . Depression   . Obstructive sleep apnea   . TIA (transient ischemic attack)     Possible, 2007  . History of cardiac catheterization     Widely patent coronary and renal arteries November 2013    Past Surgical History  Procedure Laterality Date  . Neck surgery    . Vasectomy    . Knee surgery Right     Current Outpatient Prescriptions  Medication Sig  Dispense Refill  . amLODipine (NORVASC) 10 MG tablet Take 1 tablet (10 mg total) by mouth daily. 90 tablet 3  . aspirin 81 MG tablet Take 81 mg by mouth daily.    . blood glucose meter kit and supplies KIT Dispense based on patient and insurance preference. Use daily as directed. (FOR ICD - 10 E11.9) 1 each 5  . buPROPion (WELLBUTRIN XL) 300 MG 24 hr tablet Take 1 tablet (300 mg total) by mouth daily. 90 tablet 1  . diazepam (VALIUM) 10 MG tablet TAKE 1 TABLET 3 TIMES A DAY AS NEEDED 90 tablet 5  . diclofenac (VOLTAREN) 75 MG EC tablet Take 1 tablet (75 mg total) by mouth 2 (two) times daily. 60 tablet 2  . hydrochlorothiazide (HYDRODIURIL) 12.5 MG tablet Take 1 tablet (12.5 mg total) by mouth daily. 90 tablet 1  . lisinopril (PRINIVIL,ZESTRIL) 40 MG tablet Take 1 tablet (40 mg total) by mouth daily. 90 tablet 3  . metFORMIN (GLUCOPHAGE) 500 MG tablet Take 1 tablet po in am and 2 tablets po in the evening 270 tablet 1  . methylphenidate (CONCERTA) 54 MG PO CR tablet Take 1 tablet (54 mg total) by mouth every morning. 30 tablet 0  . metoprolol succinate (TOPROL-XL) 50 MG 24 hr tablet TAKE 1 TABLET BY MOUTH DAILY. TAKE WITH OR IMMEDIATELY FOLLOWING A MEAL. 90 tablet 3  . pantoprazole (PROTONIX) 40 MG tablet Take 1 tablet (40 mg total) by mouth daily. 90 tablet 3  . potassium chloride SA (KLOR-CON M20) 20 MEQ tablet TAKE 2 TABLETS  EACH MORNING 180 tablet 3  . pravastatin (PRAVACHOL) 80 MG tablet Take 1 tablet (80 mg total) by mouth daily. 90 tablet 3  . venlafaxine XR (EFFEXOR-XR) 150 MG 24 hr capsule Take 1 capsule (150 mg total) by mouth 2 (two) times daily. 180 capsule 1   No current facility-administered medications for this visit.    Allergies as of 02/12/2015  . (No Known Allergies)    Family History  Problem Relation Age of Onset  . Colon cancer Mother   . Hypertension Mother   . CAD Mother   . Breast cancer Mother   . Liver disease Neg Hx     Social History   Social History    . Marital Status: Married    Spouse Name: N/A  . Number of Children: 4  . Years of Education: N/A   Occupational History  . maintainer     Social History Main Topics  . Smoking status: Never Smoker   . Smokeless tobacco: Current User    Types: Snuff     Comment: Uses dip pouches  . Alcohol Use: 0.0 oz/week    0 Standard drinks or equivalent per week     Comment: Occasional: 1 drink once a month past few years; Previously 1-2 cases of beer/week.  . Drug Use: No  . Sexual Activity: Not Asked   Other Topics Concern  . None   Social History Narrative    Review of Systems: General: Negative for anorexia, weight loss, fever, chills, fatigue, weakness. Eyes: Negative for vision changes.  ENT: Negative for hoarseness, difficulty swallowing. CV: Negative for chest pain, angina, palpitations, peripheral edema.  Respiratory: Negative for dyspnea at rest, cough, sputum, wheezing.  GI: See history of present illness. Endo: Negative for unusual weight change.  Heme: Negative for bruising or bleeding.  Physical Exam: BP 142/97 mmHg  Pulse 64  Temp(Src) 97.2 F (36.2 C)  Ht 5' 6"  (1.676 m)  Wt 225 lb 3.2 oz (102.15 kg)  BMI 36.37 kg/m2 General:   Obese male alert and oriented. Pleasant and cooperative. Well-nourished and well-developed.  Head:  Normocephalic and atraumatic. Eyes:  Without icterus, sclera clear and conjunctiva pink.  Ears:  Normal auditory acuity. Cardiovascular:  S1, S2 present without murmurs appreciated. Extremities without clubbing or edema. Respiratory:  Clear to auscultation bilaterally. No wheezes, rales, or rhonchi. No distress.  Gastrointestinal:  +BS, rounded but soft, non-tender and non-distended. Hepatomegaly noted 2-3 fingerbreadths below the right costal margin. No guarding or rebound. No masses appreciated.  Rectal:  Deferred  Neurologic:  Alert and oriented x4;  grossly normal neurologically. Psych:  Alert and cooperative. Normal mood and  affect. Heme/Lymph/Immune: No excessive bruising noted.    02/12/2015 8:54 AM

## 2015-02-12 NOTE — Patient Instructions (Signed)
1. I put in for a few labs free to have drawn next couple days when you're able to. 2. We will refer you to a nutritionist for them to evaluate in the view guidance on diet for better diabetes control and weight loss to prevent persistent fatty liver leading to worsening cirrhosis. 3. Return for follow-up in 6 months.

## 2015-02-23 ENCOUNTER — Other Ambulatory Visit: Payer: Self-pay | Admitting: Family Medicine

## 2015-02-24 ENCOUNTER — Other Ambulatory Visit: Payer: Self-pay

## 2015-02-27 MED ORDER — DIAZEPAM 10 MG PO TABS: ORAL_TABLET | ORAL | Status: DC

## 2015-02-27 NOTE — Telephone Encounter (Signed)
May have this and 5 refills

## 2015-03-10 ENCOUNTER — Telehealth: Payer: Self-pay | Admitting: Family Medicine

## 2015-03-10 DIAGNOSIS — E1165 Type 2 diabetes mellitus with hyperglycemia: Secondary | ICD-10-CM

## 2015-03-10 DIAGNOSIS — K760 Fatty (change of) liver, not elsewhere classified: Secondary | ICD-10-CM

## 2015-03-10 DIAGNOSIS — IMO0001 Reserved for inherently not codable concepts without codable children: Secondary | ICD-10-CM

## 2015-03-10 DIAGNOSIS — E785 Hyperlipidemia, unspecified: Secondary | ICD-10-CM

## 2015-03-10 DIAGNOSIS — E119 Type 2 diabetes mellitus without complications: Secondary | ICD-10-CM

## 2015-03-10 DIAGNOSIS — I10 Essential (primary) hypertension: Secondary | ICD-10-CM

## 2015-03-10 NOTE — Telephone Encounter (Signed)
Lipid, liver, metabolic 7, hemoglobin [id number], urine ACR-diabetes hyperlipidemia hypertension

## 2015-03-10 NOTE — Telephone Encounter (Signed)
Patient has 73mth appointment on 1/12 and needing lab work done.

## 2015-03-11 NOTE — Addendum Note (Signed)
Addended by: Dairl Ponder on: 03/11/2015 08:33 AM   Modules accepted: Orders

## 2015-03-11 NOTE — Telephone Encounter (Signed)
Blood work ordered in EPIC. Patient notified. 

## 2015-03-13 ENCOUNTER — Other Ambulatory Visit: Payer: Self-pay | Admitting: Family Medicine

## 2015-03-13 LAB — CBC WITH DIFFERENTIAL/PLATELET
BASOS ABS: 0.1 10*3/uL (ref 0.0–0.1)
Basophils Relative: 1 % (ref 0–1)
EOS PCT: 4 % (ref 0–5)
Eosinophils Absolute: 0.3 10*3/uL (ref 0.0–0.7)
HEMATOCRIT: 44.9 % (ref 39.0–52.0)
HEMOGLOBIN: 15.6 g/dL (ref 13.0–17.0)
LYMPHS ABS: 2.2 10*3/uL (ref 0.7–4.0)
LYMPHS PCT: 34 % (ref 12–46)
MCH: 30.8 pg (ref 26.0–34.0)
MCHC: 34.7 g/dL (ref 30.0–36.0)
MCV: 88.6 fL (ref 78.0–100.0)
MPV: 9.9 fL (ref 8.6–12.4)
Monocytes Absolute: 0.7 10*3/uL (ref 0.1–1.0)
Monocytes Relative: 11 % (ref 3–12)
NEUTROS ABS: 3.3 10*3/uL (ref 1.7–7.7)
Neutrophils Relative %: 50 % (ref 43–77)
Platelets: 160 10*3/uL (ref 150–400)
RBC: 5.07 MIL/uL (ref 4.22–5.81)
RDW: 14.9 % (ref 11.5–15.5)
WBC: 6.5 10*3/uL (ref 4.0–10.5)

## 2015-03-13 LAB — PROTIME-INR
INR: 1.11 (ref <1.50)
PROTHROMBIN TIME: 14.4 s (ref 11.6–15.2)

## 2015-03-14 LAB — HEPATIC FUNCTION PANEL
ALK PHOS: 92 IU/L (ref 39–117)
ALT: 41 IU/L (ref 0–44)
AST: 41 IU/L — AB (ref 0–40)
Albumin: 4.2 g/dL (ref 3.5–5.5)
Bilirubin Total: 0.7 mg/dL (ref 0.0–1.2)
Bilirubin, Direct: 0.25 mg/dL (ref 0.00–0.40)
TOTAL PROTEIN: 6.8 g/dL (ref 6.0–8.5)

## 2015-03-14 LAB — HEMOGLOBIN A1C
ESTIMATED AVERAGE GLUCOSE: 137 mg/dL
HEMOGLOBIN A1C: 6.4 % — AB (ref 4.8–5.6)

## 2015-03-14 LAB — COMPREHENSIVE METABOLIC PANEL
ALBUMIN: 4.1 g/dL (ref 3.6–5.1)
ALT: 41 U/L (ref 9–46)
AST: 41 U/L — AB (ref 10–35)
Alkaline Phosphatase: 83 U/L (ref 40–115)
BILIRUBIN TOTAL: 0.7 mg/dL (ref 0.2–1.2)
BUN: 11 mg/dL (ref 7–25)
CALCIUM: 9.6 mg/dL (ref 8.6–10.3)
CHLORIDE: 102 mmol/L (ref 98–110)
CO2: 27 mmol/L (ref 20–31)
Creat: 0.92 mg/dL (ref 0.70–1.33)
Glucose, Bld: 107 mg/dL — ABNORMAL HIGH (ref 65–99)
Potassium: 3.4 mmol/L — ABNORMAL LOW (ref 3.5–5.3)
Sodium: 141 mmol/L (ref 135–146)
Total Protein: 6.9 g/dL (ref 6.1–8.1)

## 2015-03-14 LAB — BASIC METABOLIC PANEL
BUN / CREAT RATIO: 11 (ref 9–20)
BUN: 10 mg/dL (ref 6–24)
CHLORIDE: 100 mmol/L (ref 96–106)
CO2: 27 mmol/L (ref 18–29)
Calcium: 9.4 mg/dL (ref 8.7–10.2)
Creatinine, Ser: 0.94 mg/dL (ref 0.76–1.27)
GFR calc Af Amer: 104 mL/min/{1.73_m2} (ref >59)
GFR calc non Af Amer: 90 mL/min/{1.73_m2} (ref >59)
GLUCOSE: 109 mg/dL — AB (ref 65–99)
POTASSIUM: 3.5 mmol/L (ref 3.5–5.2)
SODIUM: 141 mmol/L (ref 134–144)

## 2015-03-14 LAB — LIPID PANEL
CHOLESTEROL TOTAL: 163 mg/dL (ref 100–199)
Chol/HDL Ratio: 4.8 ratio units (ref 0.0–5.0)
HDL: 34 mg/dL — AB (ref >39)
LDL Calculated: 99 mg/dL (ref 0–99)
TRIGLYCERIDES: 151 mg/dL — AB (ref 0–149)
VLDL CHOLESTEROL CAL: 30 mg/dL (ref 5–40)

## 2015-03-14 LAB — UNABLE TO VOID

## 2015-03-17 NOTE — Progress Notes (Addendum)
Liver calculations based on visit labs: MELD: 8 Child-Pugh: A

## 2015-03-17 NOTE — Progress Notes (Signed)
PATIENT SCHEDULED FOR F/U

## 2015-03-19 ENCOUNTER — Ambulatory Visit (INDEPENDENT_AMBULATORY_CARE_PROVIDER_SITE_OTHER): Payer: BLUE CROSS/BLUE SHIELD | Admitting: Family Medicine

## 2015-03-19 ENCOUNTER — Encounter: Payer: Self-pay | Admitting: Family Medicine

## 2015-03-19 VITALS — BP 132/80 | Ht 66.0 in | Wt 225.1 lb

## 2015-03-19 DIAGNOSIS — I10 Essential (primary) hypertension: Secondary | ICD-10-CM | POA: Diagnosis not present

## 2015-03-19 DIAGNOSIS — K760 Fatty (change of) liver, not elsewhere classified: Secondary | ICD-10-CM | POA: Diagnosis not present

## 2015-03-19 DIAGNOSIS — F09 Unspecified mental disorder due to known physiological condition: Secondary | ICD-10-CM | POA: Diagnosis not present

## 2015-03-19 DIAGNOSIS — K7460 Unspecified cirrhosis of liver: Secondary | ICD-10-CM

## 2015-03-19 DIAGNOSIS — G4733 Obstructive sleep apnea (adult) (pediatric): Secondary | ICD-10-CM | POA: Diagnosis not present

## 2015-03-19 DIAGNOSIS — E785 Hyperlipidemia, unspecified: Secondary | ICD-10-CM

## 2015-03-19 DIAGNOSIS — E119 Type 2 diabetes mellitus without complications: Secondary | ICD-10-CM | POA: Diagnosis not present

## 2015-03-19 DIAGNOSIS — F909 Attention-deficit hyperactivity disorder, unspecified type: Secondary | ICD-10-CM | POA: Diagnosis not present

## 2015-03-19 DIAGNOSIS — F988 Other specified behavioral and emotional disorders with onset usually occurring in childhood and adolescence: Secondary | ICD-10-CM

## 2015-03-19 NOTE — Progress Notes (Signed)
Subjective:    Patient ID: Shawn [name]., male    DOB: 02-16-1956, 60 y.o.   MRN: [id number]  Diabetes He presents for his follow-up diabetic visit. He has type 2 diabetes mellitus. There are no hypoglycemic associated symptoms. There are no diabetic associated symptoms. There are no hypoglycemic complications. There are no diabetic complications. There are no known risk factors for coronary artery disease. Current diabetic treatment includes oral agent (monotherapy). He is compliant with treatment all of the time.   Patient states that he wants to discuss starting the process to file for disability.  Patient under the care of gastroenterology they have diagnosed him with cirrhosis of the liver. He has been encouraged to exercise and try to bring his weight down. He has hyperlipidemia and takes medication on a regular basis does not seem to have a problem with this Has diabetes does not really know exactly what to take on what not to take in regards to dietary aspects has been encouraged to lose weight by the gastroenterologist   Review of Systems Relates fatigue relates difficulty thinking relates low energy relates no chest tightness pressure pain or shortness of breath. No nausea vomiting diarrhea no rectal bleeding or hematuria. Does relate moderate joint pains and discomfort. Denies excessive thirst or urination.    Objective:   Physical Exam Eardrums normal throat is normal neck broad no masses lungs are clear no crackle risk for rate normal heart regular abdomen soft obese extremities trace edema. Skin warm dry. Foot exam normal. Pulses normal. Blood pressure good.  25 minutes was spent with the patient. Greater than half the time was spent in discussion and answering questions and counseling regarding the issues that the patient came in for today.      Assessment & Plan:  1. Type 2 diabetes mellitus without complication, without long-term current use of insulin ([place]) Diabetes  under decent control this patient does need some diabetic education to learn what he what 71 we will help set him up with nutritionist it is very important for him to lose weight - Microalbumin / creatinine urine ratio - Ceruloplasmin - Ambulatory referral to Nutrition and Diabetic Education  2. Essential hypertension, benign Blood pressure under decent control. Patient was encouraged to exercise on a regular basis take his medicine. - Microalbumin / creatinine urine ratio - Ceruloplasmin  3. Obstructive sleep apnea Patient is encouraged uses CPAP machine on a regular basis. - Microalbumin / creatinine urine ratio - Ceruloplasmin  4. Fatty liver I will recommend checking ceruloplasmin level in addition to this importance of watching diet losing weight discuss. - Microalbumin / creatinine urine ratio - Ceruloplasmin  5. Cirrhosis, non-alcoholic ([place]) See discussion above. Losing weight per mom to his long-term health. - Microalbumin / creatinine urine ratio - Ceruloplasmin - Ambulatory referral to Nutrition and Diabetic Education  6. Hyperlipidemia Hyperlipidemia continue medication. Not time for lab work testing currently - Microalbumin / creatinine urine ratio - Ceruloplasmin  7. ADD (attention deficit disorder) Patient not on his medication currently croalbumin / creatinine urine ratio - Ceruloplasmin  Patient does struggle with depression at times. Patient has difficult time with bilateral hand pain as well as back pain with activity he tries to stay as active as possible Patient has difficult time with energy level fatigue tiredness. Patient has difficult time with focus and with thinking. Has bouts of forgetfulness. In addition to this has difficult time thinking process through. Patient is scheduled to go back to work after  he gets over carpal tunnels syndrome surgery but he is not certain if you're be able to continue working because of his physical ailments as well as  his mental health and cognitive problems.  I believe that this patient is rapidly approaching the point of disability. If he does decide to file for disability he will let us know we will write letter of support.

## 2015-03-21 ENCOUNTER — Encounter: Payer: Self-pay | Admitting: Family Medicine

## 2015-04-27 ENCOUNTER — Encounter: Payer: BLUE CROSS/BLUE SHIELD | Attending: Family Medicine | Admitting: Nutrition

## 2015-04-27 ENCOUNTER — Encounter: Payer: Self-pay | Admitting: Nutrition

## 2015-04-27 VITALS — Ht 66.0 in | Wt 224.0 lb

## 2015-04-27 DIAGNOSIS — E119 Type 2 diabetes mellitus without complications: Secondary | ICD-10-CM | POA: Insufficient documentation

## 2015-04-27 DIAGNOSIS — E669 Obesity, unspecified: Secondary | ICD-10-CM

## 2015-04-27 DIAGNOSIS — K7460 Unspecified cirrhosis of liver: Secondary | ICD-10-CM | POA: Insufficient documentation

## 2015-04-27 NOTE — Patient Instructions (Addendum)
Goals 1. Follow My Plate Method 2. Cut out sodas, sweets, honey buns and junk food. 3. Call Dr. Sallee Lange to discuss seeing counselor or therapist for depression. 4. Drink only water 5. Eat three meals a day. 6. Avoid skipping meals. 7.  Exercise 15 minutes a day. 8. Lose 6 lbs in the next month. 9. Get A1C less than 6%.

## 2015-04-27 NOTE — Progress Notes (Signed)
Diabetes Self-Management Education  Visit Type: First/Initial  Appt. Start Time: 0800 Appt. End Time: 930 am  04/27/2015  [name], identified by name and date of birth, is a 60 y.o. male with a diagnosis of Diabetes: Type 2. He lives with is wife. Most foods are baked and broiled. He admits to [employer]. Only eats 1-2 meals per day and snacks often. Feels his blood sugar drops at times and has to eat honey bun or something to bring it up. Drinks sodas. Willing to change his diet. Works in Chiropodist at [employer]. He has never met with an RD/CDE before.  ASSESSMENT  Height 5\' 6"  (1.676 m), weight 224 lb (101.606 kg). Body mass index is 36.17 kg/(m^2).      Diabetes Self-Management Education - 04/27/15 1018    Visit Information   Visit Type First/Initial   Initial Visit   Diabetes Type Type 2   Are you taking your medications as prescribed? Yes   Health Coping   How would you rate your overall health? Good   Psychosocial Assessment   Patient Belief/Attitude about Diabetes Motivated to manage diabetes   Self-care barriers Impaired vision;Low literacy;Unsteady gait/risk for falls   Self-management support Doctor's office;Family;Friends   Other persons present Patient   Patient Concerns Nutrition/Meal planning;Healthy Lifestyle;Glycemic Control;Weight Control   Special Needs None   Preferred Learning Style Auditory;Hands on   Learning Readiness Ready   How often do you need to have someone help you when you read instructions, pamphlets, or other written materials from your doctor or pharmacy? 3 - Sometimes   What is the last grade level you completed in school? 12   Complications   Last HgB A1C per patient/outside source 6.4 %   How often do you check your blood sugar? 0 times/day (not testing)   Have you had a dilated eye exam in the past 12 months? No   Have you had a dental exam in the past 12 months? No   Are you checking your feet? No   Dietary Intake   Breakfast Fruity peppbles with 2% milk or maple and brown sugar oatmeal   Snack (morning) honey bun or misc    Lunch cheerwine and pimento cheese sandwich and potato chips   Snack (afternoon) nabs and cherrwine   Dinner meat, vegetables, water   Snack (evening) chips or ice cream   Beverage(s) Water, soda   Exercise   Exercise Type ADL's   How many days per week to you exercise? 0   Patient Education   Previous Diabetes Education No   Disease state  Definition of diabetes, type 1 and 2, and the diagnosis of diabetes;Factors that contribute to the development of diabetes;Explored patient's options for treatment of their diabetes   Nutrition management  Role of diet in the treatment of diabetes and the relationship between the three main macronutrients and blood glucose level;Carbohydrate counting;Meal timing in regards to the patients' current diabetes medication.;Reviewed blood glucose goals for pre and post meals and how to evaluate the patients' food intake on their blood glucose level.   Physical activity and exercise  Role of exercise on diabetes management, blood pressure control and cardiac health.   Medications Reviewed patients medication for diabetes, action, purpose, timing of dose and side effects.   Monitoring Purpose and frequency of SMBG.;Taught/discussed recording of test results and interpretation of SMBG.   Acute complications Taught treatment of hypoglycemia - the 15 rule.   Chronic complications Relationship between chronic  complications and blood glucose control;Lipid levels, blood glucose control and heart disease   Psychosocial adjustment Worked with patient to identify barriers to care and solutions;Role of stress on diabetes;Helped patient identify a support system for diabetes management   Personal strategies to promote health Lifestyle issues that need to be addressed for better diabetes care   Individualized Goals (developed by patient)   Nutrition Follow meal plan  discussed;General guidelines for healthy choices and portions discussed;Adjust meds/carbs with exercise as discussed   Physical Activity Exercise 5-7 days per week   Monitoring  test my blood glucose as discussed   Reducing Risk examine blood glucose patterns;do foot checks daily;treat hypoglycemia with 15 grams of carbs if blood glucose less than 6m/dL   Health Coping ask for help with (comment)  Diet   Outcomes   Expected Outcomes Demonstrated interest in learning. Expect positive outcomes   Future DMSE 4-6 wks   Program Status Completed      Individualized Plan for Diabetes Self-Management Training:   Learning Objective:  Patient will have a greater understanding of diabetes self-management. Patient education plan is to attend individual and/or group sessions per assessed needs and concerns.   Plan:   Patient Instructions  Goals 1. Follow My Plate Method 2. Cut out sodas, sweets, honey buns and junk food. 3. Call Dr. SSallee Langeto discuss seeing counselor or therapist for depression. 4. Drink only water 5. Eat three meals a day. 6. Avoid skipping meals. 7.  Exercise 15 minutes a day. 8. Lose 6 lbs in the next month. 9. Get A1C less than 6%.    Expected Outcomes:  Demonstrated interest in learning. Expect positive outcomes  Education material provided: Living Well with Diabetes, Meal plan card, My Plate and Carbohydrate counting sheet  If problems or questions, patient to contact team via:  Phone  Future DSME appointment: 4-6 wks  Recommend referral to a counselor and reevaluation of his meds for depression.

## 2015-05-23 ENCOUNTER — Other Ambulatory Visit: Payer: Self-pay | Admitting: Family Medicine

## 2015-05-25 ENCOUNTER — Ambulatory Visit: Payer: BLUE CROSS/BLUE SHIELD | Admitting: Nutrition

## 2015-06-08 ENCOUNTER — Telehealth: Payer: Self-pay | Admitting: Family Medicine

## 2015-06-08 DIAGNOSIS — M2550 Pain in unspecified joint: Secondary | ICD-10-CM

## 2015-06-08 DIAGNOSIS — R7303 Prediabetes: Secondary | ICD-10-CM

## 2015-06-08 DIAGNOSIS — M791 Myalgia, unspecified site: Secondary | ICD-10-CM

## 2015-06-08 NOTE — Telephone Encounter (Signed)
Spoke with patient and informed him per Dr.Scott Luking- Dr.Scott would recommend rheumatoid factor, ANA antibody, sedimentation rate,C reactive protein,CK, hemoglobin A1c.  Should do lab work and follow-up for an office visit to discuss, he could do lab work any time nonfasting is fine. Should set up a standard follow-up visit within the next 2 weeks. Patient verbalized understanding and was transferred to front desk to schedule office visit.

## 2015-06-08 NOTE — Telephone Encounter (Signed)
I would recommend rheumatoid factor, ANA antibody, sedimentation rate,C reactive protein,CK, hemoglobin A1c-myalgias, arthralgia, prediabetes-patient should do lab work and follow-up for an office visit to discuss, he could do lab work any time nonfasting is fine-patient should set up a standard follow-up visit within the next 2 weeks

## 2015-06-08 NOTE — Telephone Encounter (Signed)
Spoke with patient to discuss pain to bilateral legs. Patient stated that the onset of pain started 6-8 weeks ago. Pain only to anterior side of legs. Patient states that he has some concerns of Rheumatoid Arthritis and would like lab orders for Rheumatoid factor to be drawn. Please advise?

## 2015-06-08 NOTE — Telephone Encounter (Signed)
Pt called wanting a nurse to call him regarding pain in his legs. Pt is wanting to get lab work done to see what is going on. Please advise.

## 2015-06-10 ENCOUNTER — Other Ambulatory Visit: Payer: Self-pay | Admitting: Family Medicine

## 2015-06-10 LAB — CERULOPLASMIN: CERULOPLASMIN: 21.9 mg/dL (ref 16.0–31.0)

## 2015-06-10 LAB — ANA: ANA: NEGATIVE

## 2015-06-10 LAB — C-REACTIVE PROTEIN: CRP: 0.3 mg/L (ref 0.0–4.9)

## 2015-06-10 LAB — MICROALBUMIN / CREATININE URINE RATIO
CREATININE, UR: 70.3 mg/dL
MICROALB/CREAT RATIO: 42.7 mg/g creat — ABNORMAL HIGH (ref 0.0–30.0)
Microalbumin, Urine: 30 ug/mL

## 2015-06-10 LAB — HEMOGLOBIN A1C
Est. average glucose Bld gHb Est-mCnc: 120 mg/dL
HEMOGLOBIN A1C: 5.8 % — AB (ref 4.8–5.6)

## 2015-06-10 LAB — SEDIMENTATION RATE: SED RATE: 2 mm/h (ref 0–30)

## 2015-06-10 LAB — CK: CK TOTAL: 197 U/L (ref 24–204)

## 2015-06-17 ENCOUNTER — Encounter: Payer: Self-pay | Admitting: Family Medicine

## 2015-06-17 ENCOUNTER — Ambulatory Visit (INDEPENDENT_AMBULATORY_CARE_PROVIDER_SITE_OTHER): Payer: BLUE CROSS/BLUE SHIELD | Admitting: Family Medicine

## 2015-06-17 VITALS — BP 138/90 | Ht 66.0 in | Wt 221.0 lb

## 2015-06-17 DIAGNOSIS — F909 Attention-deficit hyperactivity disorder, unspecified type: Secondary | ICD-10-CM | POA: Diagnosis not present

## 2015-06-17 DIAGNOSIS — G4733 Obstructive sleep apnea (adult) (pediatric): Secondary | ICD-10-CM

## 2015-06-17 DIAGNOSIS — E785 Hyperlipidemia, unspecified: Secondary | ICD-10-CM | POA: Diagnosis not present

## 2015-06-17 DIAGNOSIS — F09 Unspecified mental disorder due to known physiological condition: Secondary | ICD-10-CM

## 2015-06-17 DIAGNOSIS — R5383 Other fatigue: Secondary | ICD-10-CM

## 2015-06-17 DIAGNOSIS — F988 Other specified behavioral and emotional disorders with onset usually occurring in childhood and adolescence: Secondary | ICD-10-CM

## 2015-06-17 DIAGNOSIS — E119 Type 2 diabetes mellitus without complications: Secondary | ICD-10-CM | POA: Diagnosis not present

## 2015-06-17 DIAGNOSIS — I10 Essential (primary) hypertension: Secondary | ICD-10-CM | POA: Diagnosis not present

## 2015-06-17 DIAGNOSIS — K760 Fatty (change of) liver, not elsewhere classified: Secondary | ICD-10-CM

## 2015-06-17 MED ORDER — GABAPENTIN 100 MG PO CAPS: 100.0000 mg | ORAL_CAPSULE | Freq: Three times a day (TID) | ORAL | Status: DC

## 2015-06-17 NOTE — Progress Notes (Signed)
   Subjective:    Patient ID: Shawn [name]., male    DOB: 07/03/1955, 60 y.o.   MRN: [id number]  HPIFollow up blood work results. Patient was having significant arthralgias feeling rundown. Relates a lot of pain in shoulders also admitted legs and on the tops of the legs feel sore and painful also relates some intermittent lower leg pain had extensive lab work done we went over all of this today a vague negative sedimentation rate negative CK looks good hemoglobin A1c is at 5.8 Patient is taking 81 mg aspirin daily He states his moods overall are doing well he still suffers with concentration issues and focus issues but he found that the Concerta did not help a lot and he also states that the gastroenterologist told His liver in addition to this is taking his cholesterol medicine on a regular basis try to eat healthy also taking his acid blocker states it's keeping his reflux under good control. His blood pressure medicines try to watch salt intake. Pt states no other concerns today.    Review of Systems  Constitutional: Negative for activity change, appetite change and fatigue.  HENT: Negative for congestion.   Respiratory: Negative for cough.   Cardiovascular: Negative for chest pain.  Gastrointestinal: Negative for abdominal pain.  Endocrine: Negative for polydipsia and polyphagia.  Neurological: Negative for weakness.  Psychiatric/Behavioral: Negative for confusion.       Objective:   Physical Exam  Constitutional: He appears well-nourished. No distress.  Cardiovascular: Normal rate, regular rhythm and normal heart sounds.   No murmur heard. Pulmonary/Chest: Effort normal and breath sounds normal. No respiratory distress.  Musculoskeletal: He exhibits no edema.  Lymphadenopathy:    He has no cervical adenopathy.  Neurological: He is alert.  Psychiatric: His behavior is normal.  Vitals reviewed.         Assessment & Plan:  #1 reflux good control continue current  measures #2 patient needs colonoscopy I told him that he needs to discuss this with gastroenterology a few weeks when he sees him to get colonoscopy Patient also has significant amount of stress issues. Has difficult time concentration. His cognitive skills are not as good as they have been but does not appear to be Alzheimer's He has a lot of orthopedic pains and discomfort patient also suffers with sleep apnea has difficult time tolerating the mask but will be trying again he does take his blood pressure medicine regular paced try to watch his diet he does try to watch her cholesterol in his diet as well Commendation orthopedic problems neuropathic pain as well as cognitive mental health issues it is significantly possible that this patient will need to go out on disability somewhere in the coming year

## 2015-07-17 ENCOUNTER — Ambulatory Visit: Payer: BLUE CROSS/BLUE SHIELD | Admitting: Family Medicine

## 2015-07-28 ENCOUNTER — Telehealth: Payer: Self-pay | Admitting: Family Medicine

## 2015-07-28 NOTE — Telephone Encounter (Signed)
I am fine with referring him to a diabetic specialist but please let the patient know that his A1c on last visit was actually considered to be good and I am doubtful that the specialists we'll do anything dramatically different than what we are doing for his level of diabetes but nonetheless if the patient wants referral please give to him. As for the other issue let the patient know that I will be calling him back somewhere within the next few days

## 2015-07-28 NOTE — Telephone Encounter (Signed)
Pt is wanting to know if he could be referred to a diabetic specialist?  He is also wanting to talk with you about a personal issue, he states That you spoke to him about this at his last visit.  He would not give me Any further detail.   [phone] his cell

## 2015-07-28 NOTE — Telephone Encounter (Signed)
Notified patient Dr. Nicki Reaper is fine with referring him to a diabetic specialist but please let the patient know that his A1c on last visit was actually considered to be good and he is doubtful that the specialists we'll do anything dramatically different than what we are doing for his level of diabetes but nonetheless if the patient wants referral please give to him. As for the other issue told patient that Dr. Nicki Reaper will be calling him back somewhere within the next few days. Patient verbalized understanding and agreed to hold off on the referral at this time.

## 2015-08-13 ENCOUNTER — Encounter: Payer: Self-pay | Admitting: Nurse Practitioner

## 2015-08-13 ENCOUNTER — Ambulatory Visit (INDEPENDENT_AMBULATORY_CARE_PROVIDER_SITE_OTHER): Payer: BLUE CROSS/BLUE SHIELD | Admitting: Nurse Practitioner

## 2015-08-13 VITALS — BP 146/95 | HR 98 | Temp 97.7°F | Ht 66.0 in | Wt 219.2 lb

## 2015-08-13 DIAGNOSIS — E669 Obesity, unspecified: Secondary | ICD-10-CM | POA: Diagnosis not present

## 2015-08-13 DIAGNOSIS — K7460 Unspecified cirrhosis of liver: Secondary | ICD-10-CM

## 2015-08-13 NOTE — Assessment & Plan Note (Signed)
Obese male, likely nonalcoholic liver disease. Recommend diet and exercise to reduce weight related to obesity which will not only help keep his blood sugars under control but will help prevent further liver damage. Routine labs and imaging today. Return for follow-up in 6 months.

## 2015-08-13 NOTE — Assessment & Plan Note (Signed)
Currently with well compensated disease. We'll check routine labs and imaging. Recommend continue diet weight loss efforts, follow nutritionist advice. Make best attempts to get weight and blood sugar under control to prevent worsening liver damage. Avoid alcohol and other hepatotoxic medications such as large doses of Tylenol. Return for follow-up in 6 months.

## 2015-08-13 NOTE — Progress Notes (Signed)
cc'ed to pcp °

## 2015-08-13 NOTE — Patient Instructions (Signed)
1. Have your labs drawn when you're able to. 2. We will be schedule your ultrasound. 3. Avoid alcohol, large doses of Tylenol. 4. Continue to work on diet and weight loss. 5. Return for follow-up in 6 months.

## 2015-08-13 NOTE — Progress Notes (Signed)
Referring Provider: [name], MD Primary Care Physician:  Sallee Lange, MD Primary GI:  Dr. Gala Romney  Chief Complaint  Patient presents with  . Follow-up    HPI:   Shawn [name]. is a 60 y.o. male who presents For follow-up and routine cirrhosis care. Recently diagnosed with cirrhosis, nonalcoholic and viral hepatitis, iron studies, autoimmune labs all normal. Likely fatty liver. Family history of cirrhosis with his half-brother being diagnosed about 6 years ago and doing well with diet modifications. Elastography on file noted S3 and S4. He was last seen in our office 02/12/2015 at which point he was doing quite well. At last visit labs and abdominal imaging were ordered. He was also referred to nutritionist for dietary counseling for glycemic control to prevent worsening fatty liver per the patient's request. It appears she did see nutrition on 04/27/2015. Labs resulted and appears to have STATED disease with a meld score of 8 and child Pugh A. abdominal imaging with anticipated cirrhotic morphology, no discrete hepatic lesions noted.  Today he states he's doing well overall. Saw nutritionist in February which went ok, gave him information. Denies abdominal pain, N/V, yellowing of skin/eyes, darkened urine, acute episodic confusion, tremors, abdominal swelling, LE edema. Denies chest pain, dyspnea, dizziness, lightheadedness, syncope, near syncope. Denies any other upper or lower GI symptoms.  Past Medical History  Diagnosis Date  . Essential hypertension, benign     Long-standing history, negative secondary workup  . Type 2 diabetes mellitus ([place])   . Anxiety   . Attention deficit disorder   . Depression   . Obstructive sleep apnea   . TIA (transient ischemic attack)     Possible, 2007  . History of cardiac catheterization     Widely patent coronary and renal arteries November 2013  . Hyperlipidemia   . Cirrhosis of liver ([place])   . CVA (cerebral vascular accident) ([place])    . Diabetic neuropathy [facility])     Past Surgical History  Procedure Laterality Date  . Neck surgery    . Vasectomy    . Knee surgery Right     Current Outpatient Prescriptions  Medication Sig Dispense Refill  . amLODipine (NORVASC) 10 MG tablet Take 1 tablet (10 mg total) by mouth daily. 90 tablet 3  . aspirin 81 MG tablet Take 81 mg by mouth daily.    . blood glucose meter kit and supplies KIT Dispense based on patient and insurance preference. Use daily as directed. (FOR ICD - 10 E11.9) 1 each 5  . buPROPion (WELLBUTRIN XL) 300 MG 24 hr tablet Take 1 tablet (300 mg total) by mouth daily. 90 tablet 1  . diazepam (VALIUM) 10 MG tablet TAKE 1 TABLET 3 TIMES A DAY AS NEEDED 90 tablet 5  . diclofenac (VOLTAREN) 75 MG EC tablet Take 1 tablet (75 mg total) by mouth 2 (two) times daily. 60 tablet 2  . gabapentin (NEURONTIN) 100 MG capsule Take 1 capsule (100 mg total) by mouth 3 (three) times daily. 270 capsule 1  . hydrochlorothiazide (HYDRODIURIL) 12.5 MG tablet TAKE 1 TABLET DAILY 90 tablet 1  . lisinopril (PRINIVIL,ZESTRIL) 40 MG tablet Take 1 tablet (40 mg total) by mouth daily. 90 tablet 3  . metFORMIN (GLUCOPHAGE) 500 MG tablet Take 1 tablet po in am and 2 tablets po in the evening 270 tablet 1  . metoprolol succinate (TOPROL-XL) 50 MG 24 hr tablet TAKE 1 TABLET BY MOUTH DAILY. TAKE WITH OR IMMEDIATELY FOLLOWING A MEAL.  90 tablet 3  . pantoprazole (PROTONIX) 40 MG tablet Take 1 tablet (40 mg total) by mouth daily. 90 tablet 3  . potassium chloride SA (KLOR-CON M20) 20 MEQ tablet TAKE 2 TABLETS EACH MORNING 180 tablet 3  . pravastatin (PRAVACHOL) 80 MG tablet Take 1 tablet (80 mg total) by mouth daily. 90 tablet 3  . venlafaxine XR (EFFEXOR-XR) 150 MG 24 hr capsule TAKE 1 CAPSULE TWICE DAILY 180 capsule 0   No current facility-administered medications for this visit.    Allergies as of 08/13/2015  . (No Known Allergies)    Family History  Problem Relation Age of Onset  . Colon  cancer Mother   . Hypertension Mother   . CAD Mother   . Breast cancer Mother   . Liver disease Neg Hx     Social History   Social History  . Marital Status: Married    Spouse Name: N/A  . Number of Children: 4  . Years of Education: N/A   Occupational History  . maintainer     Social History Main Topics  . Smoking status: Never Smoker   . Smokeless tobacco: Current User    Types: Snuff     Comment: Uses dip pouches occasionally  . Alcohol Use: No     Comment: Previously 1-2 cases of beer/week.  . Drug Use: No  . Sexual Activity: Not Asked   Other Topics Concern  . None   Social History Narrative    Review of Systems: General: Negative for anorexia, weight loss, fever, chills, fatigue, weakness. ENT: Negative for hoarseness, nasal congestion. CV: Negative for chest pain, angina, palpitations, peripheral edema.  Respiratory: Negative for dyspnea at rest, cough, sputum, wheezing.  GI: See history of present illness. Neuro: Positive for diabetic neuropathy pain.  Endo: Negative for unusual weight change.  Heme: Negative for bruising or bleeding.   Physical Exam: BP 146/95 mmHg  Pulse 98  Temp(Src) 97.7 F (36.5 C)  Ht _0  (1.676 m)  Wt 219 lb 3.2 oz (99.428 kg)  BMI 35.40 kg/m2 General:   Obese male. Alert and oriented. Pleasant and cooperative. Well-nourished and well-developed.  Eyes:  Without icterus, sclera clear and conjunctiva pink.  Ears:  Normal auditory acuity. Cardiovascular:  S1, S2 present without murmurs appreciated. Extremities without clubbing or edema. Respiratory:  Clear to auscultation bilaterally. No wheezes, rales, or rhonchi. No distress.  Gastrointestinal:  +BS, obese but soft, non-tender and non-distended. No HSM noted. No guarding or rebound. No masses appreciated.  Rectal:  Deferred  Musculoskalatal:  Symmetrical without gross deformities. Neurologic:  Alert and oriented x4;  grossly normal neurologically. Psych:  Alert and  cooperative. Normal mood and affect. Heme/Lymph/Immune: No excessive bruising noted.    08/13/2015 8:22 AM   Disclaimer: This note was dictated with voice recognition software. Similar sounding words can inadvertently be transcribed and may not be corrected upon review.

## 2015-08-19 ENCOUNTER — Ambulatory Visit (HOSPITAL_COMMUNITY): Admission: RE | Admit: 2015-08-19 | Payer: BLUE CROSS/BLUE SHIELD | Source: Ambulatory Visit

## 2015-08-29 ENCOUNTER — Other Ambulatory Visit: Payer: Self-pay | Admitting: Family Medicine

## 2015-09-03 IMAGING — US US ABDOMEN LIMITED
1 series · 13 of 15 positions shown · non-contrast
Comparison: [DATE]

CLINICAL DATA: Cirrhosis identified on previous ultrasound.

EXAM:
US ABDOMEN LIMITED - RIGHT UPPER QUADRANT
ULTRASOUND HEPATIC ELASTOGRAPHY
TECHNIQUE: Limited right upper quadrant abdominal ultrasound was performed. In
addition, ultrasound elastography evaluation of the liver was
performed. A region of interest was placed in the right lobe of the
liver. Following application of a compressive sonographic pulse,
shear waves were detected in the adjacent hepatic tissue and the
shear wave velocity was calculated. Multiple assessments were
performed at the selected site. Median shear wave velocity is
correlated to a Metavir fibrosis score.

[Series 1: us abdomen limited · 0.14mm/px · 13 of 15 slices shown]
[im 1/15]
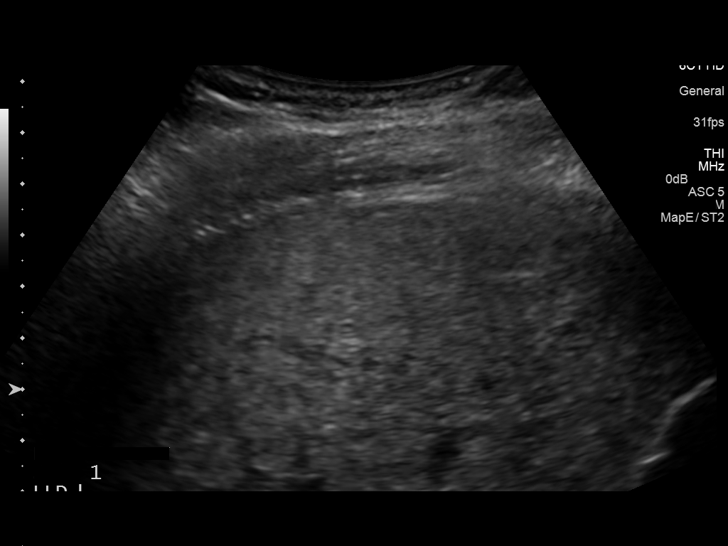
[im 2/15]
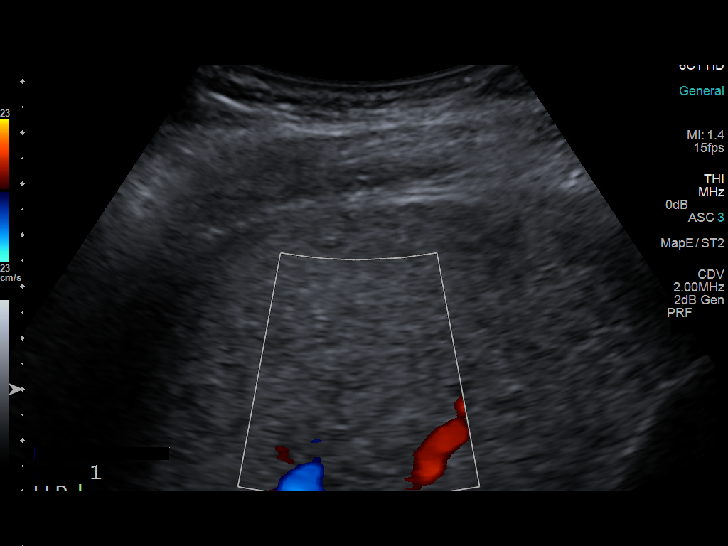
[im 3/15]
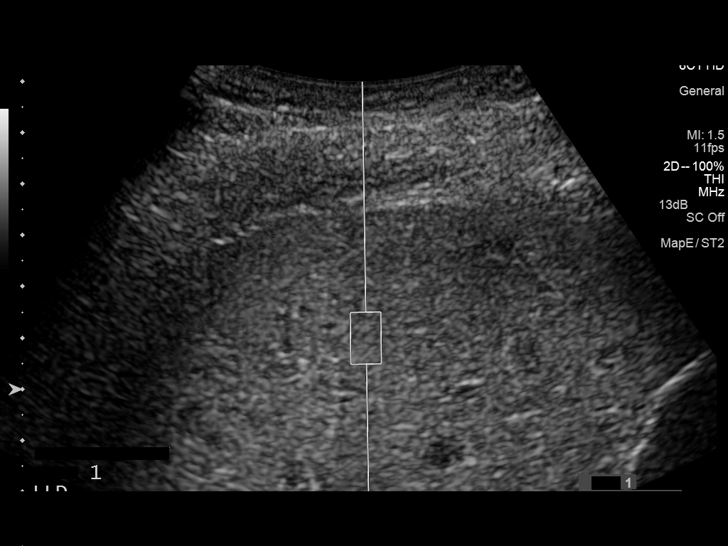
[im 5/15]
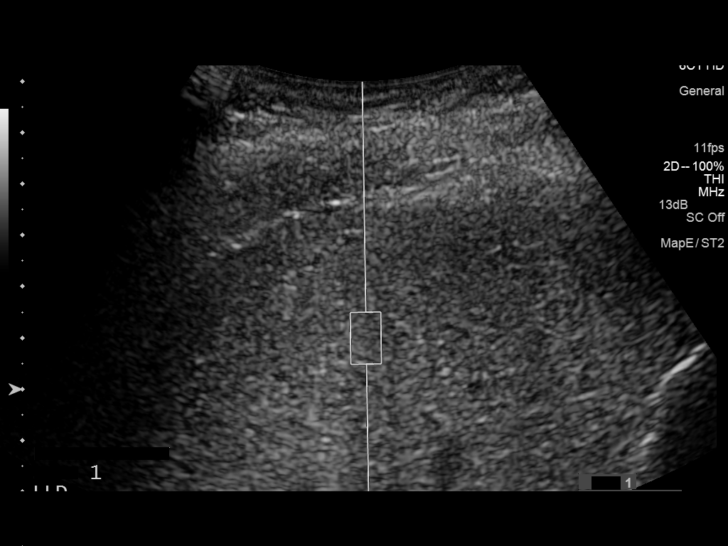
[im 6/15]
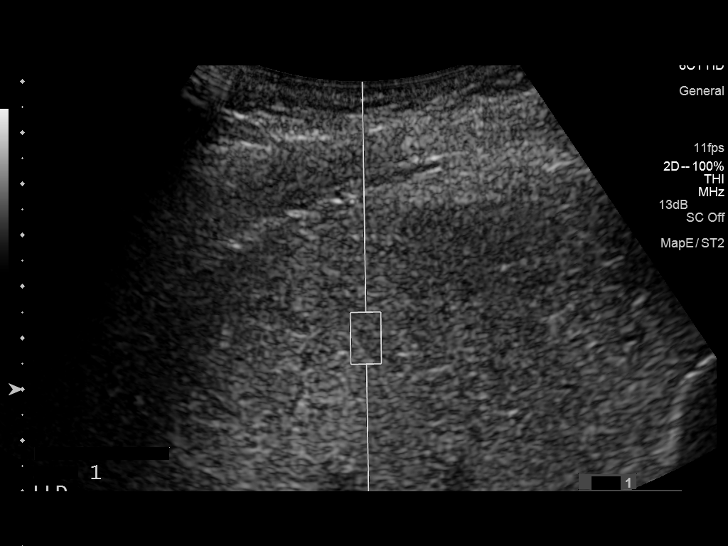
[im 7/15]
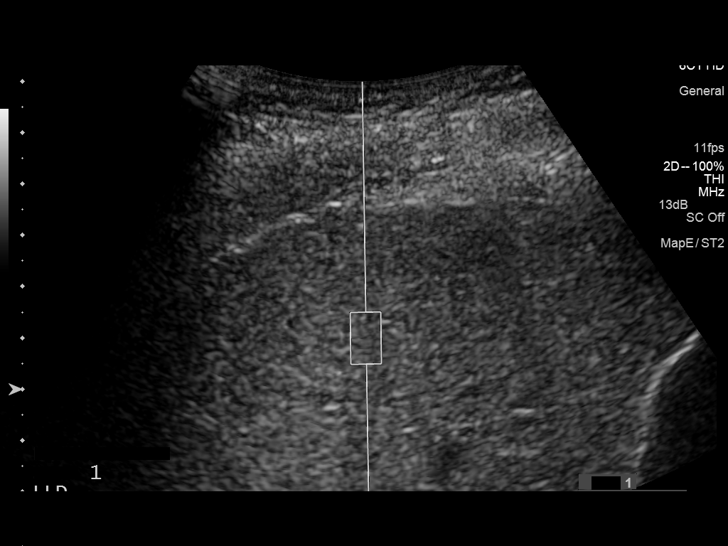
[im 8/15]
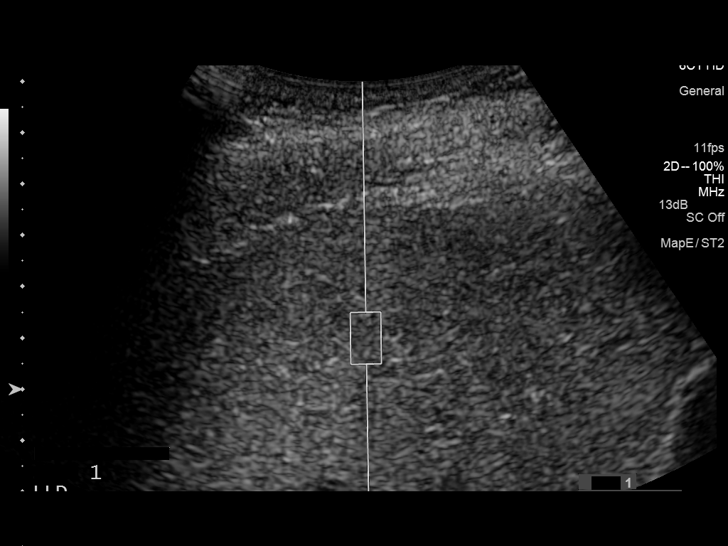
[im 9/15]
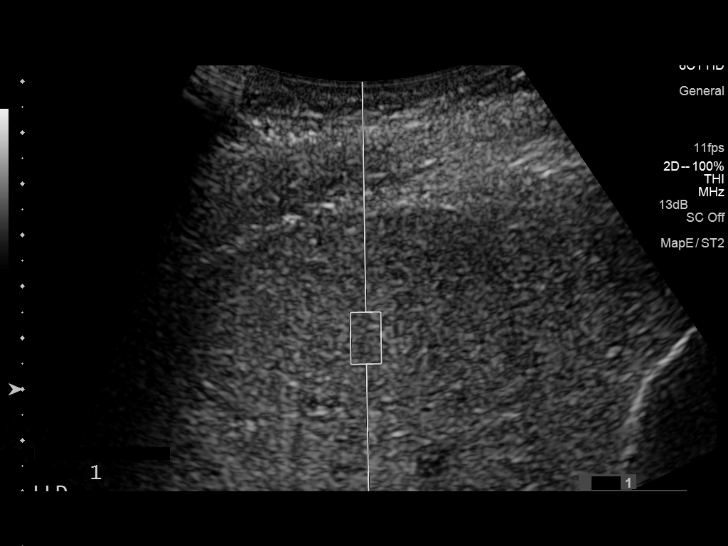
[im 10/15]
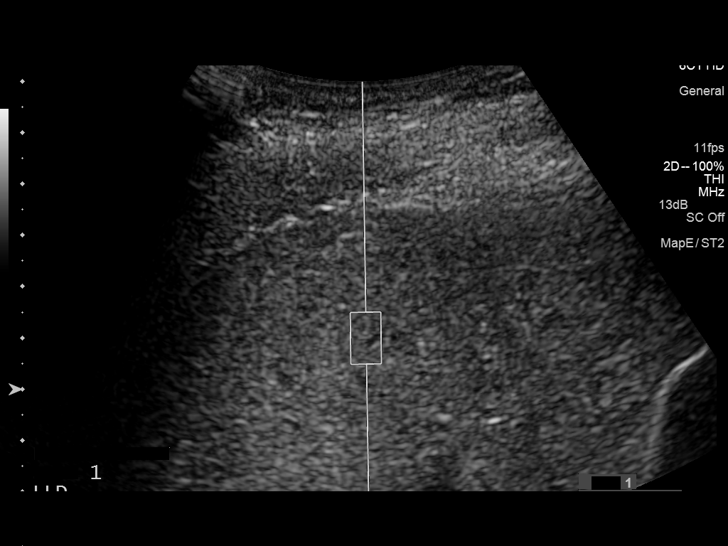
[im 11/15]
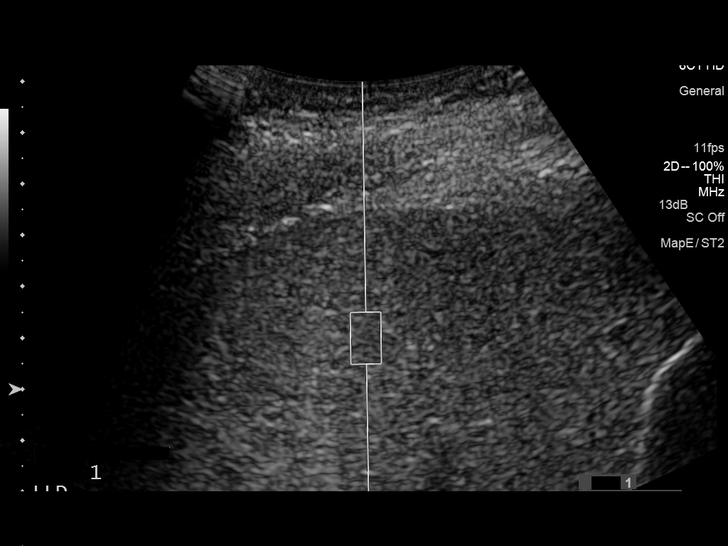
[im 13/15]
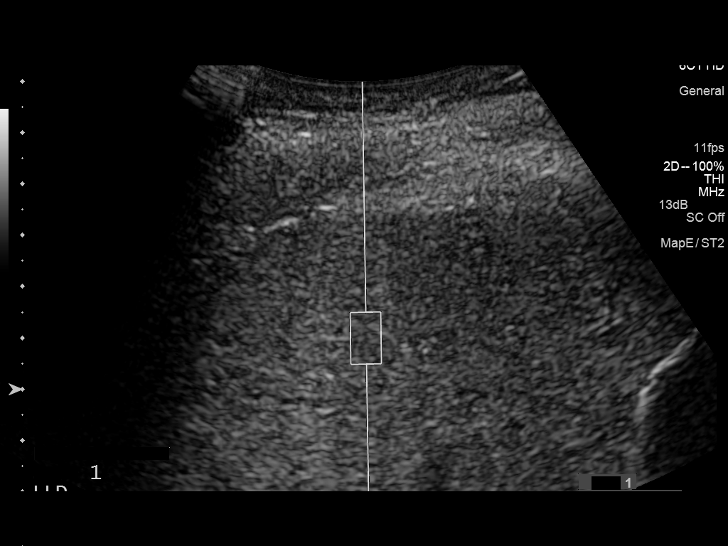
[im 14/15]
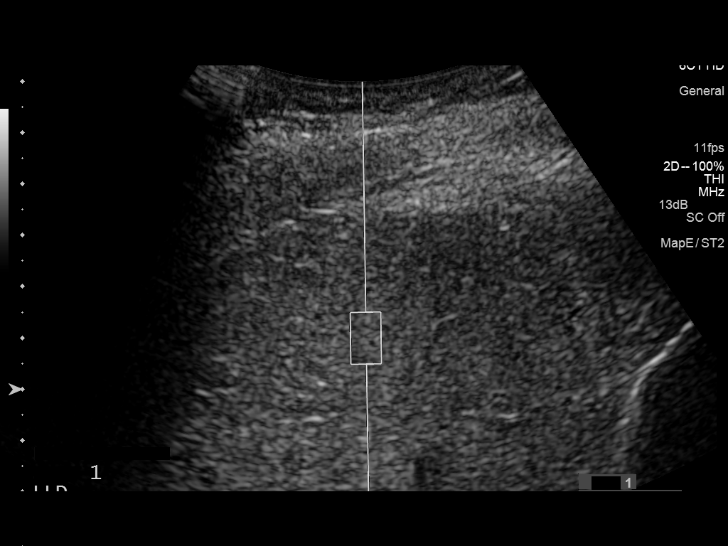
[im 15/15]
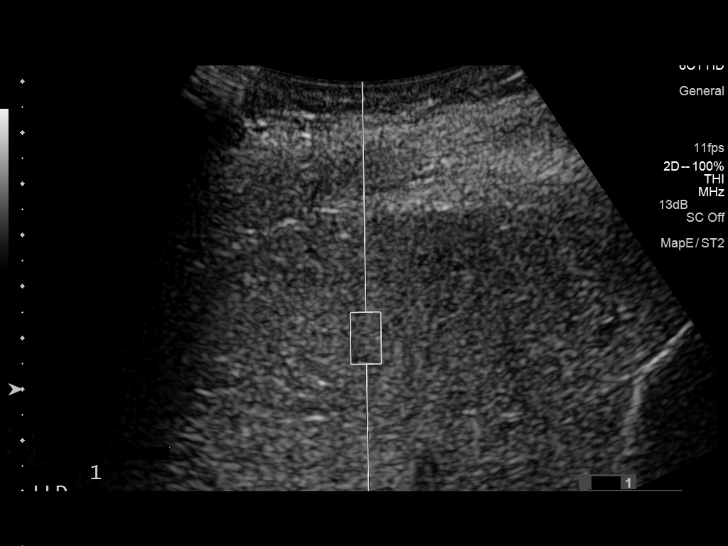

[13 of 15 positions shown; findings below may reference images not displayed]

FINDINGS: ULTRASOUND ABDOMEN LIMITED RIGHT UPPER QUADRANT

Gallbladder: Sludge is identified within the gallbladder.
Gallbladder wall is normal in thickness, 2.4 mm. No sonographic
Murphy sign.

Common bile duct: Diameter: 3.3 mm

Liver: Scalloped contour of the liver. Heterogeneous echotexture.
Generalized increased echogenicity. No focal liver lesions.

ULTRASOUND HEPATIC ELASTOGRAPHY

Device: Siemens Helix VQ

Transducer: 4V1

Patient position: Left lateral decubitus

Number of measurements:  10

Hepatic Segment:  8

Median velocity:   3.84  m/sec

IQR:

IQR/Median velocity ratio: 0.1 seconds

Corresponding Metavir fibrosis score:  Some F3 and F4

Risk of fibrosis: High

Limitations of exam: None

Pertinent findings noted on other imaging exams:  None

Please note that abnormal shear wave velocities may also be
identified in clinical settings other than with hepatic fibrosis,
such as: acute hepatitis, elevated right heart and central venous
pressures including use of beta blockers, NII AMARH disease
(NII AMARH), infiltrative processes such as
mastocytosis/amyloidosis/infiltrative tumor, extrahepatic
cholestasis, in the post-prandial state, and liver transplantation.
Correlation with patient history, laboratory data, and clinical
condition recommended.
IMPRESSION: Cirrhotic morphology of the liver.

Gallbladder sludge.

Median hepatic shear wave velocity is calculated at 3.84 m/sec.
Corresponding Metavir fibrosis score is some F3 and F4.
Risk of fibrosis is high.

Follow-up:  Follow-up is advised.

## 2015-09-06 ENCOUNTER — Other Ambulatory Visit: Payer: Self-pay | Admitting: Family Medicine

## 2015-10-16 ENCOUNTER — Encounter: Payer: Self-pay | Admitting: Family Medicine

## 2015-10-16 ENCOUNTER — Ambulatory Visit (INDEPENDENT_AMBULATORY_CARE_PROVIDER_SITE_OTHER): Payer: BLUE CROSS/BLUE SHIELD | Admitting: Family Medicine

## 2015-10-16 VITALS — BP 138/86 | Ht 66.0 in | Wt 224.6 lb

## 2015-10-16 DIAGNOSIS — I10 Essential (primary) hypertension: Secondary | ICD-10-CM | POA: Diagnosis not present

## 2015-10-16 DIAGNOSIS — E119 Type 2 diabetes mellitus without complications: Secondary | ICD-10-CM | POA: Diagnosis not present

## 2015-10-16 DIAGNOSIS — Z125 Encounter for screening for malignant neoplasm of prostate: Secondary | ICD-10-CM | POA: Diagnosis not present

## 2015-10-16 DIAGNOSIS — E785 Hyperlipidemia, unspecified: Secondary | ICD-10-CM | POA: Diagnosis not present

## 2015-10-16 DIAGNOSIS — K760 Fatty (change of) liver, not elsewhere classified: Secondary | ICD-10-CM

## 2015-10-16 LAB — POCT GLYCOSYLATED HEMOGLOBIN (HGB A1C): Hemoglobin A1C: 5.8

## 2015-10-16 NOTE — Patient Instructions (Signed)
Dear Patient,  It has been recommended to you that you have a colonoscopy. It is your responsibility to carry through with this recommendation.   Did you realize that colon cancer is the second leading cancer killer in the [place]. One in every 20 adults will get colon cancer. If all adults would go through the recommended screening for colon cancer (getting a colonoscopy), then there would be a 60% reduction in the number of people dying from colon cancer.  Colon cancer just doesn't come out of the blue. It starts off as a small polyp which over time grows into a cancer. A colonoscopy can prevent cancer and in many cases detected when it is at a very treatable phase. Small colon cancers can have cure rates of 95%. Advanced colon cancer, which often occurs in people who do not do their screenings, have cure rates less than 20%. The risk of colon cancer advances with age. Most adults should have regular colonoscopies every 10 years starting at age 50. This recommendation can vary depending on a person's medical history.  Health-care laws now allow for you to call the gastroenterologist office directly in order to set yourself up for this very important tests. Today we have recommended to you that you do this test. This test may save your life. Failure to do this test puts you at risk for premature death from colon cancer. Do the right thing and schedule this test now.  Here as a list of specialists we recommend in the surrounding area. When you call their office let them know that you are a patient of our practice in your interested in doing a screening colonoscopy. They should assist you without problems. You will need the following information when you called them: 1-name of which Dr. you see, 2-your insurance information, 3-a list of medications that you currently take, 4-any allergies you have to medications.  [place] gastroenterologist Dr. Mike Rourk, Dr Sandi Fields   [place]  gastroenterologist   [phone]  Dr.Najeeb Rehman [facility]   [phone]  [facility] (Dr. Perry, N, Stark, Brodie, Gesner, Jacobs and Pyrtle) [phone]  [facility] (Dr. Buscemi, Edwards, Hayes, [place],Outlaw,Schooler) [phone]  Each group of specialists has assured us that when you called them they will help you get your colonoscopy set up. Should you have problems or if the GI practice insist a referral be done please let us know. Be sure to call soon. Sincerely, Carolyn Hoskins, Dr Steve Haifa [place], Dr.Latrece Nitta    

## 2015-10-16 NOTE — Progress Notes (Signed)
   Subjective:    Patient ID: Shawn [name]., male    DOB: 01/21/1956, 60 y.o.   MRN: [id number]  Diabetes  He presents for his follow-up diabetic visit. He has type 2 diabetes mellitus. Risk factors for coronary artery disease include diabetes mellitus and hypertension. Current diabetic treatment includes oral agent (monotherapy). He is compliant with treatment all of the time. He is following a diabetic diet. Eye exam is not current.   Neck surgery-sept Patient does try to watch sugars in his diet Patient states he has chronic anxiety. He also has has history of depression. Takes his medication states he's under good control with this currently He denies any problems overall Blood pressure takes his medication states it stays under good control Has a history of fatty liver cirrhosis he understands importance watching diet excise and losing weight 25 minutes was spent with the patient. Greater than half the time was spent in discussion and answering questions and counseling regarding the issues that the patient came in for today.   Review of Systems     Objective:   Physical Exam        Assessment & Plan:  Diabetes sugar under good control continue metformin. Doing very well with the A1c. Kidney functions are good.  Blood pressure-good control currently watch diet blood pressure under good control watch diet continue medication  Fatty liver cirrhosis-exercise try to lose weight patient understands that this puts him at risk of dying from cirrhosis importance of exercise losing weight was discussed  Hyperlipidemia previous lab work reviewed with patient the importance of keeping cholesterol under good control was discussed current labs ordered  PSA ordered patient will get a prostate exam on next visit  Colonoscopy recommended patient states that he will get this done he was told that this can reduce risk of dying from colon cancer  Follow-up 6 months  Chronic anxiety  issues Valium helps. Denies a causing drowsiness. I did talk with the patient about the possibility of reducing the dose he states he is under a lot of stress currently in finds that it helps him he is been on this for years

## 2015-10-17 LAB — PSA: Prostate Specific Ag, Serum: 0.5 ng/mL (ref 0.0–4.0)

## 2015-10-17 LAB — LIPID PANEL
CHOLESTEROL TOTAL: 184 mg/dL (ref 100–199)
Chol/HDL Ratio: 5.8 ratio units — ABNORMAL HIGH (ref 0.0–5.0)
HDL: 32 mg/dL — ABNORMAL LOW (ref >39)
LDL Calculated: 112 mg/dL — ABNORMAL HIGH (ref 0–99)
TRIGLYCERIDES: 201 mg/dL — AB (ref 0–149)
VLDL Cholesterol Cal: 40 mg/dL (ref 5–40)

## 2015-10-19 ENCOUNTER — Other Ambulatory Visit: Payer: Self-pay | Admitting: Family Medicine

## 2015-10-19 ENCOUNTER — Other Ambulatory Visit: Payer: Self-pay

## 2015-10-19 DIAGNOSIS — Z79899 Other long term (current) drug therapy: Secondary | ICD-10-CM

## 2015-10-19 DIAGNOSIS — E785 Hyperlipidemia, unspecified: Secondary | ICD-10-CM

## 2015-10-19 MED ORDER — GLUCOSE BLOOD VI STRP: ORAL_STRIP | 1 refills | Status: DC

## 2015-10-19 MED ORDER — PRAVASTATIN SODIUM 80 MG PO TABS: 80.0000 mg | ORAL_TABLET | Freq: Every day | ORAL | 5 refills | Status: DC

## 2015-10-19 MED ORDER — ONETOUCH DELICA LANCETS 33G MISC: 1 refills | Status: DC

## 2015-10-19 NOTE — Addendum Note (Signed)
Addended by: Dairl Ponder on: 10/19/2015 03:53 PM   Modules accepted: Orders

## 2015-10-21 ENCOUNTER — Telehealth: Payer: Self-pay | Admitting: Family Medicine

## 2015-10-21 NOTE — Telephone Encounter (Signed)
Patient notified that script will be faxed to pharmacy.  

## 2015-10-21 NOTE — Telephone Encounter (Signed)
Pt is needing a prescription for a cpap machine to be faxed over to [facility].

## 2015-10-22 ENCOUNTER — Telehealth: Payer: Self-pay | Admitting: Family Medicine

## 2015-10-22 NOTE — Telephone Encounter (Signed)
Patients spouse called concerned because Shawn [name]'s cholesterol was high the last time he had blood work done.  She says that Shawn [name] has been taking his cholesterol medication and they are concerned that there may be another reason why his cholesterol may be up.  She would like for [place] to give Shawn [name] a call at the number provided and discuss this with him.

## 2015-10-22 NOTE — Telephone Encounter (Signed)
Spoke with patient and he wanted to inform us that he has restarted on the pravastatin 80 MG 2 days ago. States he had went several weeks without it but has restarted it and he wanted to let us know that he will be repeating the labs in 8 weeks.

## 2015-11-12 ENCOUNTER — Other Ambulatory Visit: Payer: Self-pay | Admitting: *Deleted

## 2015-11-12 NOTE — Telephone Encounter (Signed)
May have this and 2 refills 

## 2015-11-13 ENCOUNTER — Other Ambulatory Visit: Payer: Self-pay

## 2015-11-13 MED ORDER — DIAZEPAM 10 MG PO TABS: ORAL_TABLET | ORAL | 2 refills | Status: DC

## 2015-11-23 LAB — CBC WITH DIFFERENTIAL/PLATELET
BASOS PCT: 1 %
Basophils Absolute: 48 cells/uL (ref 0–200)
EOS PCT: 4 %
Eosinophils Absolute: 192 cells/uL (ref 15–500)
HEMATOCRIT: 45.8 % (ref 38.5–50.0)
Hemoglobin: 16 g/dL (ref 13.2–17.1)
LYMPHS PCT: 34 %
Lymphs Abs: 1632 cells/uL (ref 850–3900)
MCH: 31.1 pg (ref 27.0–33.0)
MCHC: 34.9 g/dL (ref 32.0–36.0)
MCV: 88.9 fL (ref 80.0–100.0)
MONOS PCT: 10 %
MPV: 10.6 fL (ref 7.5–12.5)
Monocytes Absolute: 480 cells/uL (ref 200–950)
NEUTROS PCT: 51 %
Neutro Abs: 2448 cells/uL (ref 1500–7800)
PLATELETS: 128 10*3/uL — AB (ref 140–400)
RBC: 5.15 MIL/uL (ref 4.20–5.80)
RDW: 13.9 % (ref 11.0–15.0)
WBC: 4.8 10*3/uL (ref 3.8–10.8)

## 2015-11-23 LAB — COMPREHENSIVE METABOLIC PANEL
ALK PHOS: 85 U/L (ref 40–115)
ALT: 33 U/L (ref 9–46)
AST: 31 U/L (ref 10–35)
Albumin: 4.1 g/dL (ref 3.6–5.1)
BILIRUBIN TOTAL: 0.6 mg/dL (ref 0.2–1.2)
BUN: 10 mg/dL (ref 7–25)
CALCIUM: 8.9 mg/dL (ref 8.6–10.3)
CO2: 31 mmol/L (ref 20–31)
CREATININE: 0.83 mg/dL (ref 0.70–1.33)
Chloride: 102 mmol/L (ref 98–110)
GLUCOSE: 125 mg/dL — AB (ref 65–99)
Potassium: 3.6 mmol/L (ref 3.5–5.3)
SODIUM: 142 mmol/L (ref 135–146)
Total Protein: 6.6 g/dL (ref 6.1–8.1)

## 2015-11-23 LAB — PROTIME-INR
INR: 1.2 — ABNORMAL HIGH
Prothrombin Time: 12.5 s — ABNORMAL HIGH (ref 9.0–11.5)

## 2015-11-23 LAB — AFP TUMOR MARKER: AFP TUMOR MARKER: 6.2 ng/mL — AB (ref <6.1)

## 2015-11-24 LAB — HEPATIC FUNCTION PANEL
ALT: 34 IU/L (ref 0–44)
AST: 33 IU/L (ref 0–40)
Albumin: 4 g/dL (ref 3.5–5.5)
Alkaline Phosphatase: 96 IU/L (ref 39–117)
BILIRUBIN, DIRECT: 0.18 mg/dL (ref 0.00–0.40)
Bilirubin Total: 0.6 mg/dL (ref 0.0–1.2)
TOTAL PROTEIN: 6.8 g/dL (ref 6.0–8.5)

## 2015-11-24 LAB — LIPID PANEL
CHOL/HDL RATIO: 5.1 ratio — AB (ref 0.0–5.0)
Cholesterol, Total: 138 mg/dL (ref 100–199)
HDL: 27 mg/dL — AB (ref >39)
LDL CALC: 66 mg/dL (ref 0–99)
TRIGLYCERIDES: 224 mg/dL — AB (ref 0–149)
VLDL Cholesterol Cal: 45 mg/dL — ABNORMAL HIGH (ref 5–40)

## 2015-12-03 ENCOUNTER — Encounter: Payer: Self-pay | Admitting: Family Medicine

## 2015-12-03 ENCOUNTER — Telehealth: Payer: Self-pay | Admitting: Family Medicine

## 2015-12-03 ENCOUNTER — Ambulatory Visit (INDEPENDENT_AMBULATORY_CARE_PROVIDER_SITE_OTHER): Payer: BLUE CROSS/BLUE SHIELD | Admitting: Family Medicine

## 2015-12-03 VITALS — BP 130/90 | Ht 66.0 in | Wt 224.4 lb

## 2015-12-03 DIAGNOSIS — K760 Fatty (change of) liver, not elsewhere classified: Secondary | ICD-10-CM | POA: Diagnosis not present

## 2015-12-03 DIAGNOSIS — I10 Essential (primary) hypertension: Secondary | ICD-10-CM

## 2015-12-03 DIAGNOSIS — E119 Type 2 diabetes mellitus without complications: Secondary | ICD-10-CM

## 2015-12-03 DIAGNOSIS — E785 Hyperlipidemia, unspecified: Secondary | ICD-10-CM | POA: Diagnosis not present

## 2015-12-03 DIAGNOSIS — Z23 Encounter for immunization: Secondary | ICD-10-CM | POA: Diagnosis not present

## 2015-12-03 NOTE — Patient Instructions (Addendum)
Your recent LDL cholesterol looked good  Recent A1C 5.8 excellent  We will set you up with Dr.Nida- Diabetic doctor  We will send surgical clearance to the Ortho  Use sleep apnea machine

## 2015-12-03 NOTE — Progress Notes (Signed)
   Subjective:    Patient ID: Shawn Greaves., male    DOB: 28-Jan-1956, 60 y.o.   MRN: [id number]  HPI  Patient in today for a surgical clearance for neck operation. Exceptionally nice patient who is going to be having neck surgery coming up. Because of all his health problems they want him checked out before they will do the actual surgery. States no other concerns this visit.  Patient's diabetes is been under good control no high sugars. Takes his metformin on a regular basis. Blood pressures been under good control takes his medication denies any chest tightness pressure pain. States he's able to walk around without shortness of breath or heaviness in the chest able to do some yard activity. Patient does take gabapentin on a regular basis does not cause drowsiness Patient does have sleep apnea he is getting the CPAP machine to start this he understands the importance of using it on a regular basis Patient does use Valium on a regular basis he has had chronic anxiety and depression like symptoms for years he states about I am does not cause drowsiness with him. Patient has chronic arthralgias for which she does take diclofenac. He also uses 81 mg aspirin as a primary prevention regarding heart disease. Review of Systems He denies headaches denies breathing difficulties no chest tightness pressure pain denies joint pains denies swelling rashes denies rectal bleeding hematuria. No heartburn.    Objective:   Physical Exam HEENT benign patient does have a wide neck in wide girth, lungs are clear no crackles respiratory rate is normal heart is regular abdomen soft extremities no edema skin warm dry        Assessment & Plan:  Diabetes-under very good control. Continue current measures. Encourage patient watch diet try to lose weight.  Fatty liver with probable cirrhosis-being followed by gastroenterology they'll be doing ultrasound I encouraged patient watch diet try to lose  weight  Hyperlipidemia previous labs reviewed with patient. Continue cholesterol medicine.  Sleep apnea patient is getting his sleep CPAP machine he needs a use on a regular basis including when he gets his surgery  Flu shot today  Blood pressure good control continue current measures  Patient uncertain about his medications I encouraged him or his wife to bring medications by next week slightly review these  Surgical clearance this patient having cervical spine- surgery clearance-I believe this patient can in fact have surgery. He is able to walk around well without any shortness breath chest tightness pressure pain. I do not feel he needs any cardiac testing currently. His diabetes as well as his sleep apnea does increases risk for postoperative complications. I would recommend that the patient hold all anti-inflammatories including aspirin 7 days prior to the surgery. I would recommend that his surgery be done at the hospital so that he can be monitored appropriately after surgery.

## 2015-12-03 NOTE — Telephone Encounter (Signed)
Patient Surgical clearance form from [facility] is in [place] folder in office.

## 2015-12-08 ENCOUNTER — Other Ambulatory Visit: Payer: Self-pay | Admitting: Family Medicine

## 2015-12-08 NOTE — Telephone Encounter (Signed)
Please let his wife know that I reviewed over medication list that was sent. We have reconciled this with the list that we had. Continue on what he is taking according to the written list. There is no need for her to come in on Thursday to review medication since I was able to get a list. Finally please make sure there has forwarded the surgical clearance to this urgent thank you

## 2015-12-09 ENCOUNTER — Other Ambulatory Visit: Payer: Self-pay | Admitting: Family Medicine

## 2015-12-09 NOTE — Telephone Encounter (Signed)
Discussed with pt's wife. Wife verbalized understanding. Medical records states she sent surgical clearance form yesterday.

## 2015-12-09 NOTE — Telephone Encounter (Signed)
Refill all 6 months

## 2015-12-10 ENCOUNTER — Ambulatory Visit: Payer: BLUE CROSS/BLUE SHIELD | Admitting: Family Medicine

## 2015-12-18 ENCOUNTER — Ambulatory Visit: Payer: Self-pay | Admitting: Physician Assistant

## 2015-12-18 ENCOUNTER — Telehealth: Payer: Self-pay | Admitting: Family Medicine

## 2015-12-18 MED ORDER — POTASSIUM CHLORIDE CRYS ER 20 MEQ PO TBCR: EXTENDED_RELEASE_TABLET | ORAL | 1 refills | Status: DC

## 2015-12-18 NOTE — Telephone Encounter (Signed)
Refill sent to pharmacy. Left message on voicemail notifying patient.  

## 2015-12-18 NOTE — Telephone Encounter (Signed)
Pt is needing 90 day refills on his potassium chloride SA (KLOR-CON M20) 20 MEQ tablet.   [facility] [place]

## 2015-12-21 ENCOUNTER — Ambulatory Visit: Payer: Self-pay | Admitting: Physician Assistant

## 2015-12-25 NOTE — Pre-Procedure Instructions (Signed)
    Jino Marks.  12/25/2015      [id number] [place], [place] - [address] AT [address] [address] Phone: [phone] Fax: [phone]  [facility] [place], [place] [facility] [address] to [address] [employer] [address] [place] [address] Phone: [phone] Fax: [phone]    Your procedure is scheduled on 12/30/15.  Report to [facility] Admitting at 12 noon  Call this number if you have problems the morning of surgery:  [phone]   Remember:  Do not eat food or drink liquids after midnight.  Take these medicines the morning of surgery with A SIP OF WATER ---amlodipine,valium,neurontin,metoprolol,protonix,effexor  Do not wear jewelry, make-up or nail polish.  Do not wear lotions, powders, or perfumes, or deoderant.  Do not shave 48 hours prior to surgery.  Men may shave face and neck.  Do not bring valuables to the hospital.  [facility] is not responsible for any belongings or valuables.  Contacts, dentures or bridgework may not be worn into surgery.  Leave your suitcase in the car.  After surgery it may be brought to your room.  For patients admitted to the hospital, discharge time will be determined by your treatment team.  Patients discharged the day of surgery will not be allowed to drive home.   Name and phone number of your driver:   Special instructions:  Do not take any aspirin,anti-inflammatories,vitamins,or herbal supplements 5-7 days prior to surgery.  Please read over the following fact sheets that you were given. MRSA Information

## 2015-12-28 ENCOUNTER — Encounter (HOSPITAL_COMMUNITY): Payer: Self-pay

## 2015-12-28 ENCOUNTER — Encounter (HOSPITAL_COMMUNITY)
Admission: RE | Admit: 2015-12-28 | Discharge: 2015-12-28 | Disposition: A | Discharge status: Home / Self Care | Payer: BLUE CROSS/BLUE SHIELD | Source: Ambulatory Visit | Attending: Orthopedic Surgery | Admitting: Orthopedic Surgery

## 2015-12-28 DIAGNOSIS — E1140 Type 2 diabetes mellitus with diabetic neuropathy, unspecified: Secondary | ICD-10-CM | POA: Diagnosis not present

## 2015-12-28 DIAGNOSIS — F329 Major depressive disorder, single episode, unspecified: Secondary | ICD-10-CM | POA: Diagnosis not present

## 2015-12-28 DIAGNOSIS — E669 Obesity, unspecified: Secondary | ICD-10-CM | POA: Diagnosis not present

## 2015-12-28 DIAGNOSIS — F901 Attention-deficit hyperactivity disorder, predominantly hyperactive type: Secondary | ICD-10-CM | POA: Diagnosis not present

## 2015-12-28 DIAGNOSIS — I69312 Visuospatial deficit and spatial neglect following cerebral infarction: Secondary | ICD-10-CM | POA: Diagnosis not present

## 2015-12-28 DIAGNOSIS — E7800 Pure hypercholesterolemia, unspecified: Secondary | ICD-10-CM | POA: Diagnosis not present

## 2015-12-28 DIAGNOSIS — K7460 Unspecified cirrhosis of liver: Secondary | ICD-10-CM | POA: Diagnosis not present

## 2015-12-28 DIAGNOSIS — M4722 Other spondylosis with radiculopathy, cervical region: Secondary | ICD-10-CM | POA: Diagnosis present

## 2015-12-28 DIAGNOSIS — G4730 Sleep apnea, unspecified: Secondary | ICD-10-CM | POA: Diagnosis not present

## 2015-12-28 DIAGNOSIS — F419 Anxiety disorder, unspecified: Secondary | ICD-10-CM | POA: Diagnosis not present

## 2015-12-28 DIAGNOSIS — Z7984 Long term (current) use of oral hypoglycemic drugs: Secondary | ICD-10-CM | POA: Diagnosis not present

## 2015-12-28 DIAGNOSIS — Z7982 Long term (current) use of aspirin: Secondary | ICD-10-CM | POA: Diagnosis not present

## 2015-12-28 DIAGNOSIS — Z981 Arthrodesis status: Secondary | ICD-10-CM | POA: Diagnosis not present

## 2015-12-28 DIAGNOSIS — Z79899 Other long term (current) drug therapy: Secondary | ICD-10-CM | POA: Diagnosis not present

## 2015-12-28 DIAGNOSIS — Z6835 Body mass index (BMI) 35.0-35.9, adult: Secondary | ICD-10-CM | POA: Diagnosis not present

## 2015-12-28 DIAGNOSIS — F17220 Nicotine dependence, chewing tobacco, uncomplicated: Secondary | ICD-10-CM | POA: Diagnosis not present

## 2015-12-28 DIAGNOSIS — I10 Essential (primary) hypertension: Secondary | ICD-10-CM | POA: Diagnosis not present

## 2015-12-28 HISTORY — DX: Headache: R51

## 2015-12-28 HISTORY — DX: Attention-deficit hyperactivity disorder, unspecified type: F90.9

## 2015-12-28 HISTORY — DX: Headache, unspecified: R51.9

## 2015-12-28 HISTORY — DX: Malignant (primary) neoplasm, unspecified: C80.1

## 2015-12-28 LAB — HEMOGLOBIN A1C
Hgb A1c MFr Bld: 6 % — ABNORMAL HIGH (ref 4.8–5.6)
Mean Plasma Glucose: 126 mg/dL

## 2015-12-28 LAB — CBC
HEMATOCRIT: 43.6 % (ref 39.0–52.0)
Hemoglobin: 15.2 g/dL (ref 13.0–17.0)
MCH: 30.5 pg (ref 26.0–34.0)
MCHC: 34.9 g/dL (ref 30.0–36.0)
MCV: 87.6 fL (ref 78.0–100.0)
PLATELETS: 121 10*3/uL — AB (ref 150–400)
RBC: 4.98 MIL/uL (ref 4.22–5.81)
RDW: 13.4 % (ref 11.5–15.5)
WBC: 5.4 10*3/uL (ref 4.0–10.5)

## 2015-12-28 LAB — BASIC METABOLIC PANEL
ANION GAP: 8 (ref 5–15)
BUN: 11 mg/dL (ref 6–20)
CALCIUM: 9.4 mg/dL (ref 8.9–10.3)
CO2: 29 mmol/L (ref 22–32)
Chloride: 104 mmol/L (ref 101–111)
Creatinine, Ser: 0.86 mg/dL (ref 0.61–1.24)
GFR calc Af Amer: >60 mL/min (ref >60)
GLUCOSE: 135 mg/dL — AB (ref 65–99)
Potassium: 3 mmol/L — ABNORMAL LOW (ref 3.5–5.1)
Sodium: 141 mmol/L (ref 135–145)

## 2015-12-28 LAB — SURGICAL PCR SCREEN
MRSA, PCR: NEGATIVE
Staphylococcus aureus: NEGATIVE

## 2015-12-28 LAB — GLUCOSE, CAPILLARY: GLUCOSE-CAPILLARY: 158 mg/dL — AB (ref 65–99)

## 2015-12-29 MED ORDER — CEFAZOLIN SODIUM 10 G IJ SOLR
3.0000 g | INTRAMUSCULAR | Status: AC
  Administered 2015-12-30: 3 g via INTRAVENOUS
  Filled 2015-12-29: qty 3000

## 2015-12-29 NOTE — Progress Notes (Signed)
Anesthesia chart review: Patient is a 60 year old male scheduled for C6-7 ACDF on 12/30/2015 by Dr. Rolena Infante.  History includes nonsmoker, diabetes mellitus type 2 with diabetic neuropathy, hypertension, anxiety, depression, ADD, TIA (question lacunar infarct left thalamus by CT) 03/25/05, hyperlipidemia, fatty liver with cirrhosis (followed at [facility]), OSA (CPAP), headaches. BMI is consistent with obesity.  - PCP is Dr. Sallee Lange. Patient was seen on 12/03/15 for surgical clearance, and he wrote: "I believe this patient can in fact have surgery. He is able to walk around well without any shortness breath chest tightness pressure pain. I do not feel he needs any cardiac testing currently. His diabetes as well as his sleep apnea does increases risk for postoperative complications. I would recommend that the patient hold all anti-inflammatories including aspirin 7 days prior to the surgery. I would recommend that his surgery be done at the hospital so that he can be monitored appropriately after surgery." - GI is Dr. Gala Romney. - Cardiologist is Dr. Domenic Polite. PRN follow-up recommended after 11/19/13 office visit.   Med include amlodipine, aspirin 81 mg, Valium, Neurontin, HCTZ, lisinopril, metformin, Toprol-XL, Protonix, KCl, pravastatin, Effexor XR.  BP 138/89   Pulse (!) 57   Temp 36.8 C   Resp 20   Ht 5\' 6"  (1.676 m)   Wt 222 lb 6.4 oz (100.9 kg)   SpO2 95%   BMI 35.90 kg/m   12/28/15 EKG: SB at 52 bpm, first degree AV block, LAD. Precordial R wave progression has improved and rate is slightly slower, but otherwise no significant changes when compared to 2014 and 2015 tracings.  01/16/12 Cardiac cath: Final Conclusions:   1. Widely patent coronary arteries without significant obstructive disease 2. Hyperdynamic left ventricular function with elevated left ventricular end-diastolic pressure. Findings consistent with hypertensive heart disease 3. Patent renal arteries  bilaterally Recommendations: Med Rx.   12/13/11 Echo: Study Conclusions - Left ventricle: The cavity size was mildly reduced. Wall thickness was increased in a pattern of severe LVH. Systolic function was vigorous. The estimated ejection fraction was in the range of 75% to 80%. No resting LVOT gradient. Wall motion was normal; there were no regional wall motion abnormalities. Doppler parameters are consistent with abnormal left ventricular relaxation (grade 1 diastolic dysfunction). - Aortic valve: Trileaflet; mildly thickened leaflets. No significant regurgitation. Mean gradient: 54mm Hg (S). - Mitral valve: Mildly thickened leaflets . Trivial regurgitation. - Left atrium: The atrium was mildly dilated. - Atrial septum: There was increased thickness of the septum, consistent with lipomatous hypertrophy. - Tricuspid valve: Trivial regurgitation. - Pericardium, extracardiac: There was no pericardial effusion.  03/25/05 Carotid U/S: IMPRESSION: No evidence of significant plaque formation or stenosis within carotid systems. No blood flow identified right vertebral artery, question occlusion versus very slow flow.  02/06/15 Abdominal U/S with hepatic elastography: IMPRESSION: Cirrhotic morphology of the liver. Gallbladder sludge. Median hepatic shear wave velocity is calculated at 3.84 m/sec. Corresponding Metavir fibrosis score is some F3 and F4. Risk of fibrosis is high. Follow-up:  Follow-up is advised.  Preoperative labs noted. A1c 6.0. K 3.0, glucose 135. H/H 15.2/43.6. PLT 121K. HFP was not done at PAT, but he had LFTs drawn about one month ago that were WNL (AST/ALT has primarily been in the 30's-40's range since 2014), so I am not planning to add to preoperative labs. HCV not detected 12/18/14.   If no acute changes then I anticipate that he can proceed as planned.  Myra Gianotti, PA-C [facility] Short Stay Center/Anesthesiology Phone [phone])  [id number] 12/29/2015 9:59 AM

## 2015-12-30 ENCOUNTER — Ambulatory Visit (HOSPITAL_COMMUNITY): Payer: BLUE CROSS/BLUE SHIELD | Admitting: Vascular Surgery

## 2015-12-30 ENCOUNTER — Ambulatory Visit (HOSPITAL_COMMUNITY): Payer: BLUE CROSS/BLUE SHIELD | Admitting: Anesthesiology

## 2015-12-30 ENCOUNTER — Ambulatory Visit (HOSPITAL_COMMUNITY): Payer: BLUE CROSS/BLUE SHIELD

## 2015-12-30 ENCOUNTER — Encounter (HOSPITAL_COMMUNITY): Payer: Self-pay | Admitting: Anesthesiology

## 2015-12-30 ENCOUNTER — Encounter (HOSPITAL_COMMUNITY)
Admission: RE | Disposition: A | Discharge status: Home / Self Care | Payer: Self-pay | Source: Ambulatory Visit | Attending: Orthopedic Surgery

## 2015-12-30 ENCOUNTER — Observation Stay (HOSPITAL_COMMUNITY): Payer: BLUE CROSS/BLUE SHIELD

## 2015-12-30 ENCOUNTER — Observation Stay (HOSPITAL_COMMUNITY)
Admission: RE | Admit: 2015-12-30 | Discharge: 2015-12-31 | Disposition: A | Discharge status: Home / Self Care | Payer: BLUE CROSS/BLUE SHIELD | Source: Ambulatory Visit | Attending: Orthopedic Surgery | Admitting: Orthopedic Surgery

## 2015-12-30 DIAGNOSIS — F419 Anxiety disorder, unspecified: Secondary | ICD-10-CM | POA: Insufficient documentation

## 2015-12-30 DIAGNOSIS — E7800 Pure hypercholesterolemia, unspecified: Secondary | ICD-10-CM | POA: Insufficient documentation

## 2015-12-30 DIAGNOSIS — I10 Essential (primary) hypertension: Secondary | ICD-10-CM | POA: Insufficient documentation

## 2015-12-30 DIAGNOSIS — I69312 Visuospatial deficit and spatial neglect following cerebral infarction: Secondary | ICD-10-CM | POA: Insufficient documentation

## 2015-12-30 DIAGNOSIS — F901 Attention-deficit hyperactivity disorder, predominantly hyperactive type: Secondary | ICD-10-CM | POA: Insufficient documentation

## 2015-12-30 DIAGNOSIS — Z7984 Long term (current) use of oral hypoglycemic drugs: Secondary | ICD-10-CM | POA: Insufficient documentation

## 2015-12-30 DIAGNOSIS — K7460 Unspecified cirrhosis of liver: Secondary | ICD-10-CM | POA: Insufficient documentation

## 2015-12-30 DIAGNOSIS — F17220 Nicotine dependence, chewing tobacco, uncomplicated: Secondary | ICD-10-CM | POA: Insufficient documentation

## 2015-12-30 DIAGNOSIS — E1140 Type 2 diabetes mellitus with diabetic neuropathy, unspecified: Secondary | ICD-10-CM | POA: Insufficient documentation

## 2015-12-30 DIAGNOSIS — Z79899 Other long term (current) drug therapy: Secondary | ICD-10-CM | POA: Insufficient documentation

## 2015-12-30 DIAGNOSIS — M542 Cervicalgia: Secondary | ICD-10-CM | POA: Diagnosis present

## 2015-12-30 DIAGNOSIS — F329 Major depressive disorder, single episode, unspecified: Secondary | ICD-10-CM | POA: Insufficient documentation

## 2015-12-30 DIAGNOSIS — M4722 Other spondylosis with radiculopathy, cervical region: Secondary | ICD-10-CM | POA: Diagnosis not present

## 2015-12-30 DIAGNOSIS — G4730 Sleep apnea, unspecified: Secondary | ICD-10-CM | POA: Insufficient documentation

## 2015-12-30 DIAGNOSIS — E669 Obesity, unspecified: Secondary | ICD-10-CM | POA: Insufficient documentation

## 2015-12-30 DIAGNOSIS — Z419 Encounter for procedure for purposes other than remedying health state, unspecified: Secondary | ICD-10-CM

## 2015-12-30 DIAGNOSIS — Z6835 Body mass index (BMI) 35.0-35.9, adult: Secondary | ICD-10-CM | POA: Insufficient documentation

## 2015-12-30 DIAGNOSIS — Z981 Arthrodesis status: Secondary | ICD-10-CM | POA: Insufficient documentation

## 2015-12-30 DIAGNOSIS — Z7982 Long term (current) use of aspirin: Secondary | ICD-10-CM | POA: Insufficient documentation

## 2015-12-30 HISTORY — PX: ANTERIOR CERVICAL DECOMP/DISCECTOMY FUSION: SHX1161

## 2015-12-30 LAB — GLUCOSE, CAPILLARY
GLUCOSE-CAPILLARY: 144 mg/dL — AB (ref 65–99)
Glucose-Capillary: 102 mg/dL — ABNORMAL HIGH (ref 65–99)

## 2015-12-30 IMAGING — DX DG CERVICAL SPINE 2 OR 3 VIEWS
2 series · 2 of 2 positions shown · non-contrast
Comparison: Intraoperative fluoroscopy images dated [DATE]

CLINICAL DATA: 52-year-old male status post spinal fusion.

EXAM:
CERVICAL SPINE - 2-3 VIEW

[c-spine lat]
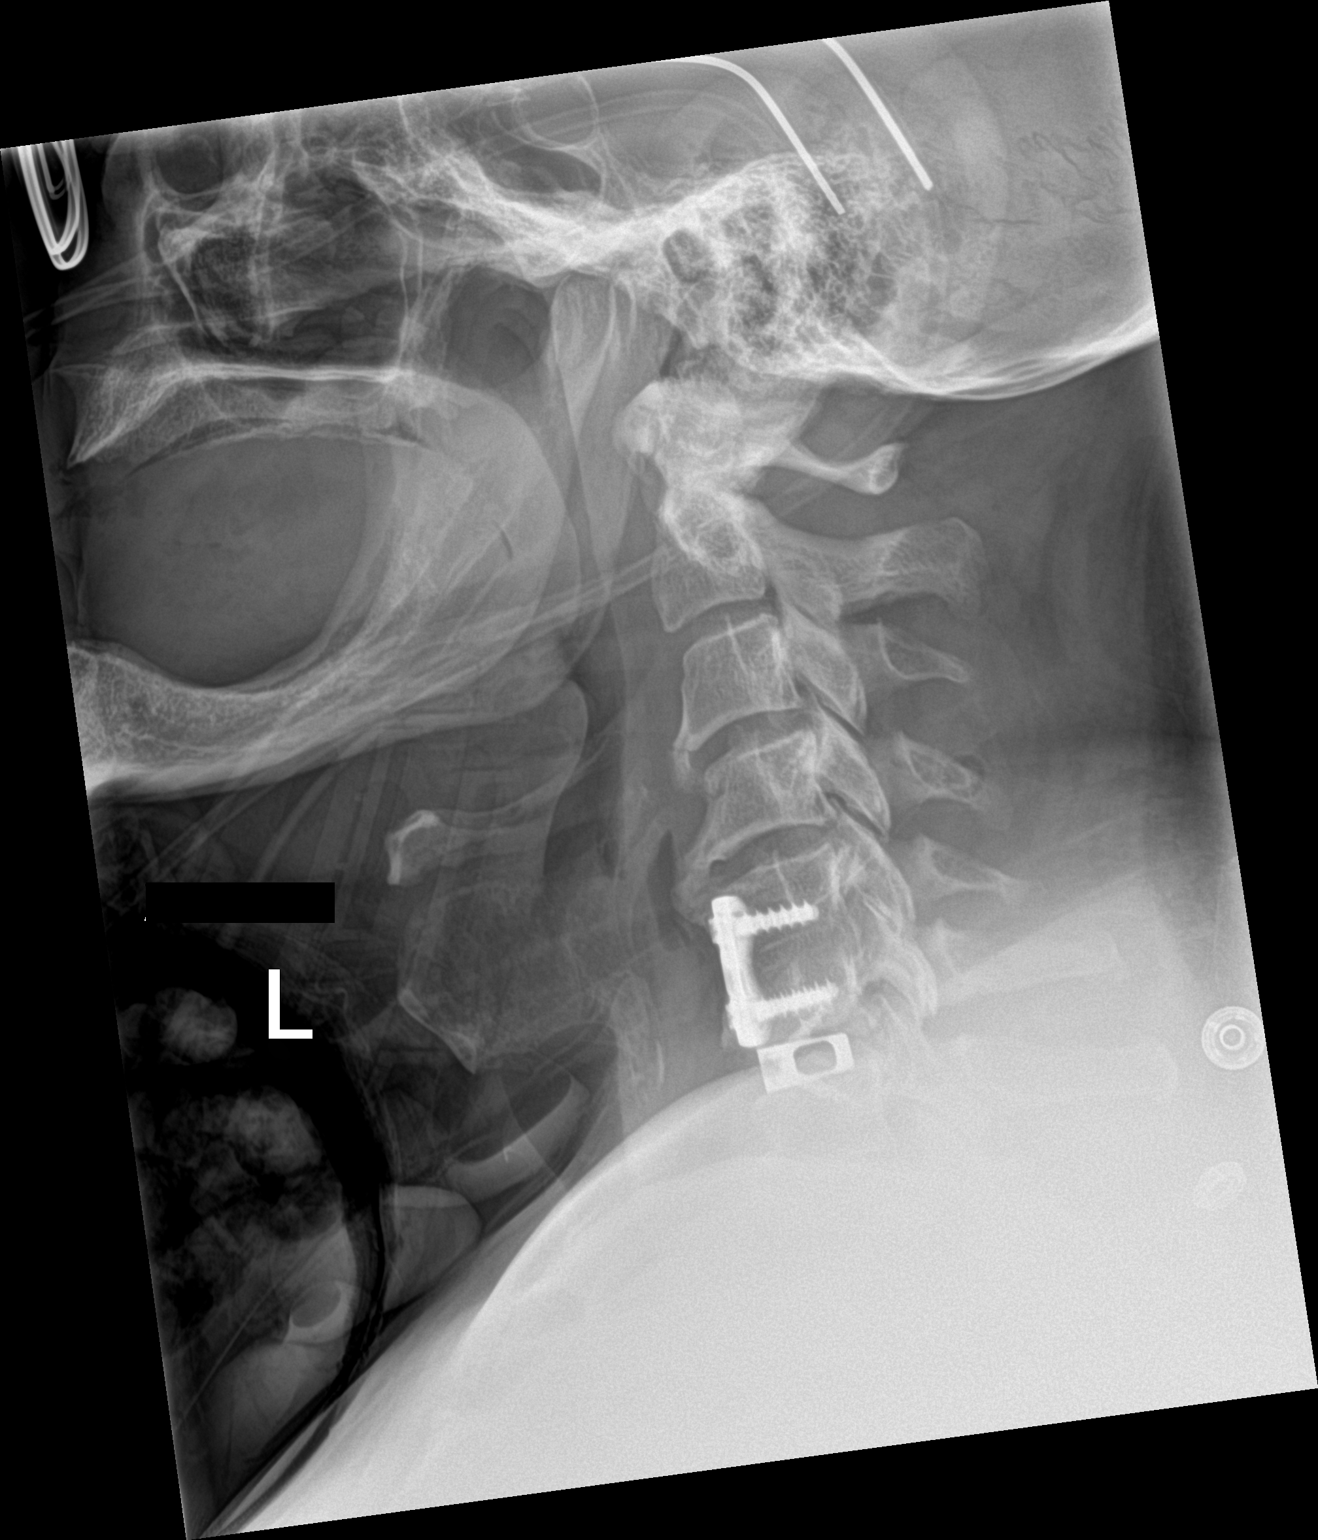

[c-spine ap]
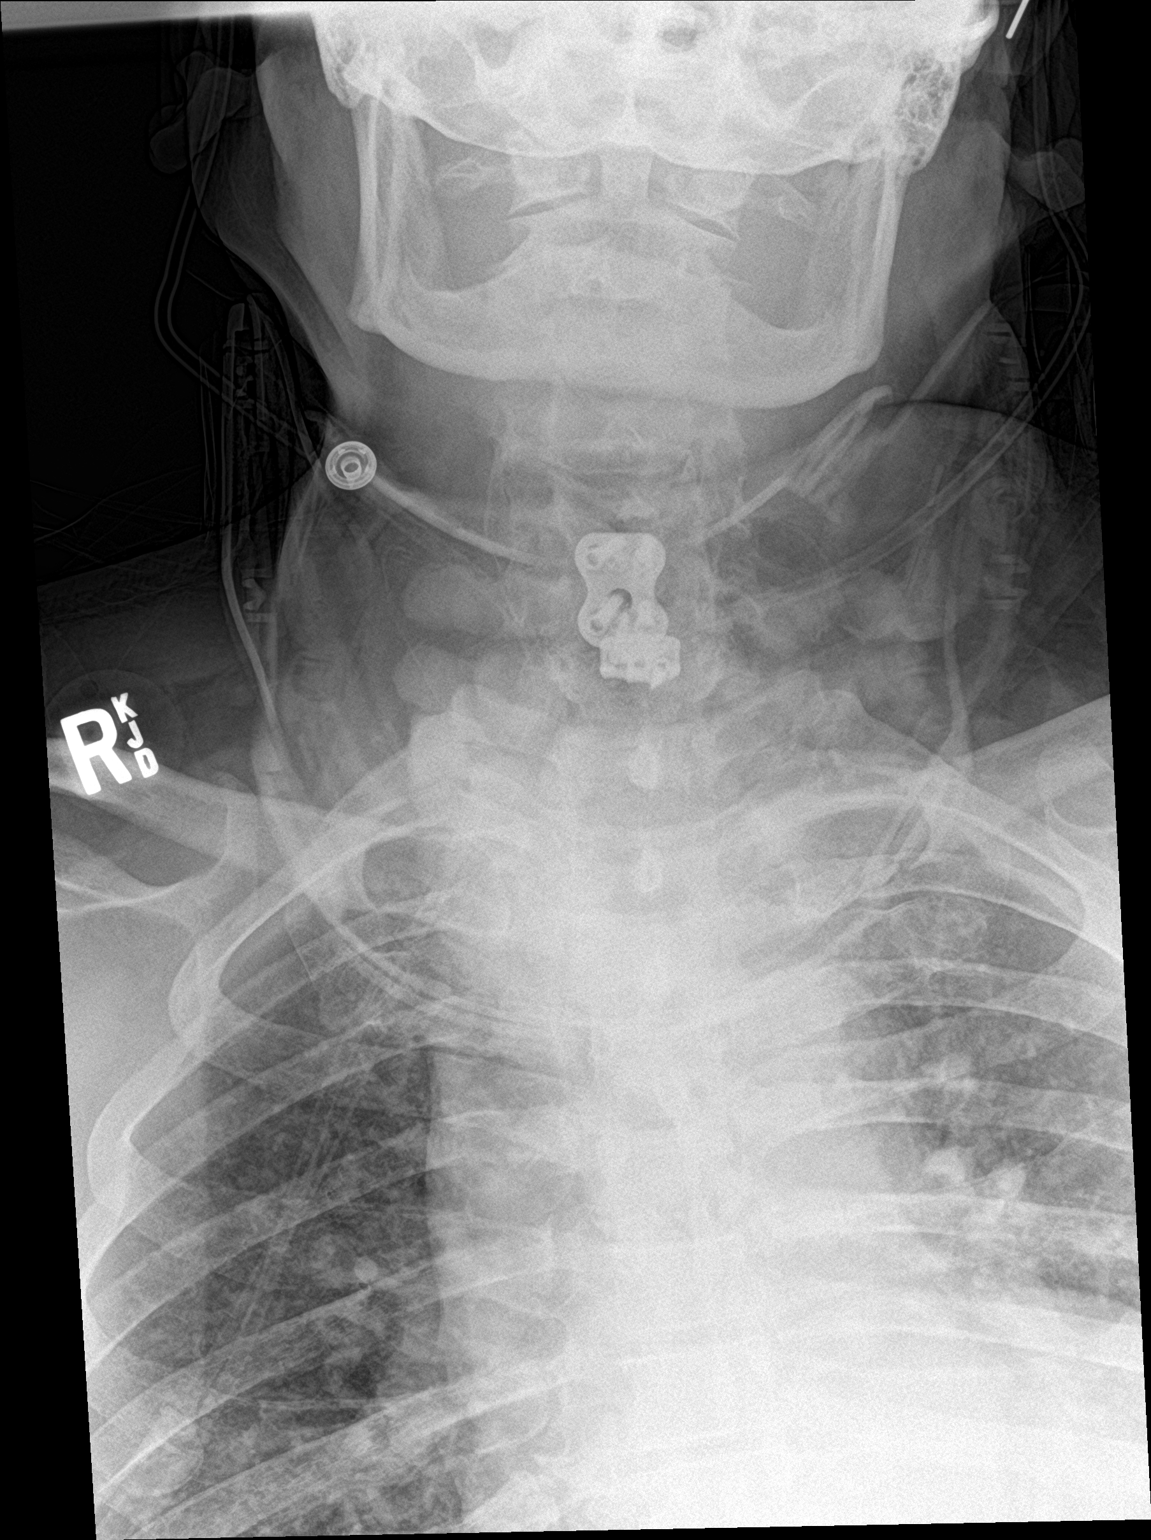

[2 of 2 positions shown; findings below may reference images not displayed]

FINDINGS: Evaluation is limited due to superimposition of the cervical collar.
No definite acute fracture or subluxation. C5-C6 anterior fusion
plate and screws, C6-C7 interbody spacer and fixation screws noted.
There are degenerative changes with anterior osteophyte. The spinous
processes appear intact. Anterior paravertebral soft tissue swelling
an air noted, likely postsurgical.
IMPRESSION: Postsurgical changes. No definite acute fracture or subluxation on
limited evaluation.

## 2015-12-30 IMAGING — RF DG CERVICAL SPINE 2 OR 3 VIEWS
1 series · 2 of 2 positions shown · IV contrast (agent unspecified)
Comparison: [DATE] cervical spine radiographs

CLINICAL DATA: C6-7 anterior cervical disc fusion

EXAM:
DG C-ARM GT 120 MIN; CERVICAL SPINE - 2-3 VIEW
CONTRAST:  None
FLUOROSCOPY TIME:  Fluoroscopy Time:  1 minutes 19 seconds
Radiation Exposure Index (if provided by the fluoroscopic device):
NA
Number of Acquired Spot Images: Two

[Series 1: run · 2 of 2 slices shown]
[im 1/2]
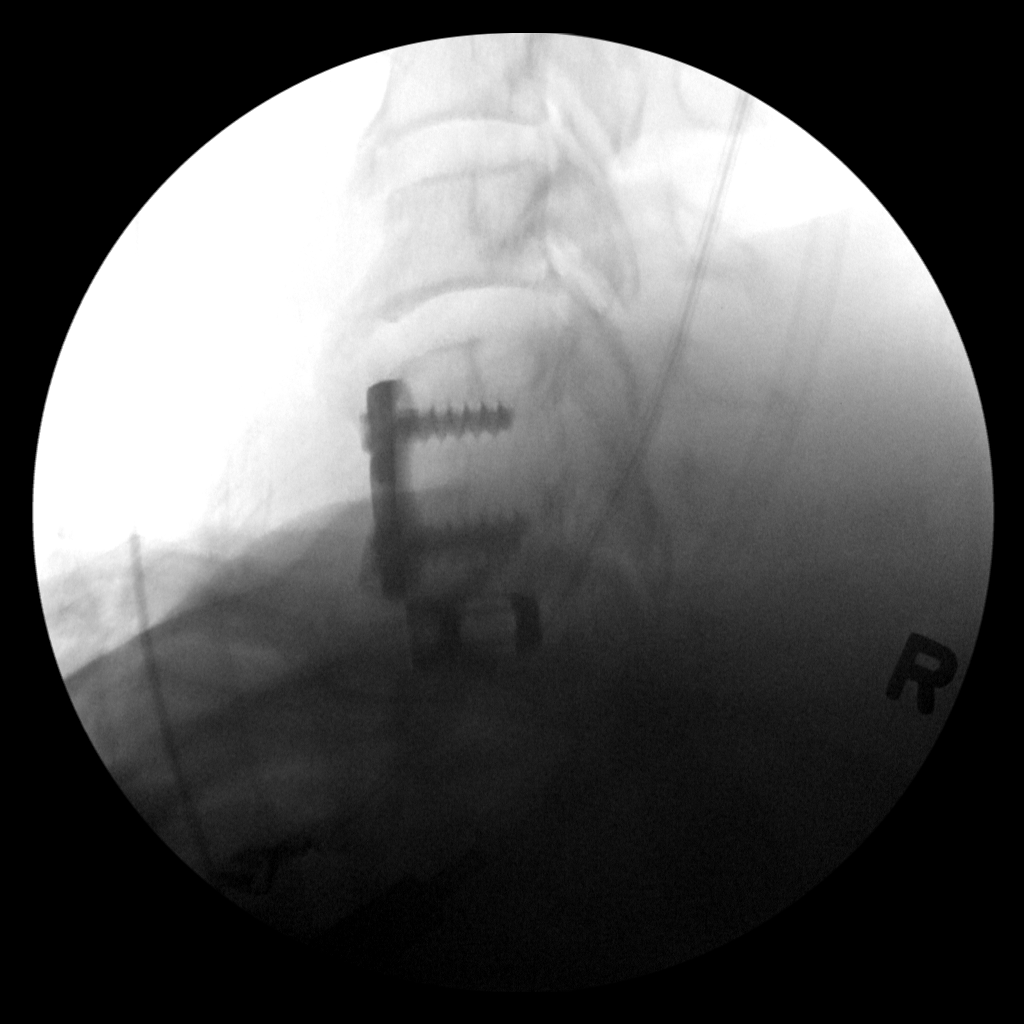
[im 2/2]
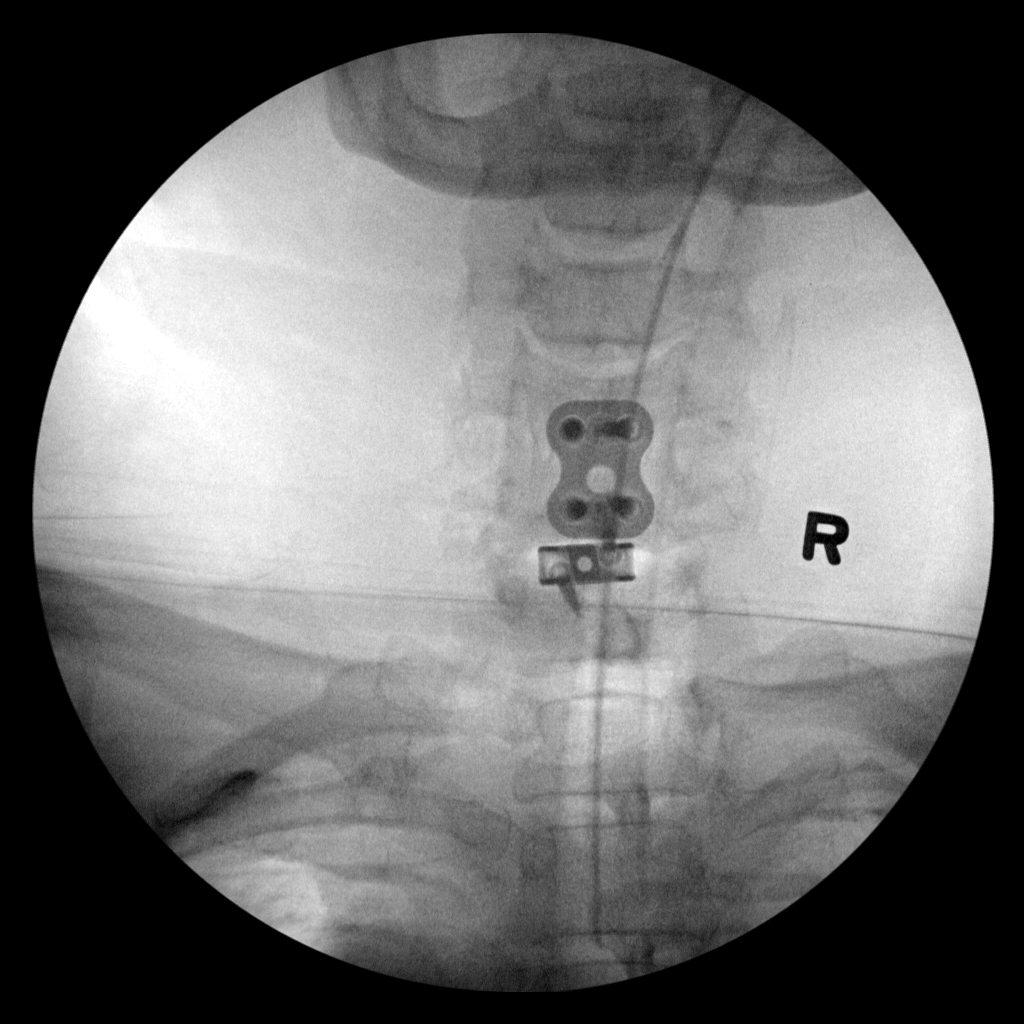

[2 of 2 positions shown; findings below may reference images not displayed]

FINDINGS: Anterior cervical screw and plate fixation across C5-6 is chronic.
New anterior cervical disc fusion across C6-7 is seen.
IMPRESSION: Chronic C5-6 anterior cervical disc fusion with plate and screws.

New anterior cervical disc fusion across C6-7.

## 2015-12-30 SURGERY — ANTERIOR CERVICAL DECOMPRESSION/DISCECTOMY FUSION 1 LEVEL/HARDWARE REMOVAL
Anesthesia: General

## 2015-12-30 MED ORDER — LIDOCAINE 2% (20 MG/ML) 5 ML SYRINGE
INTRAMUSCULAR | Status: AC
  Filled 2015-12-30: qty 5

## 2015-12-30 MED ORDER — MORPHINE SULFATE (PF) 2 MG/ML IV SOLN
1.0000 mg | INTRAVENOUS | Status: DC | PRN
  Administered 2015-12-30: 2 mg via INTRAVENOUS
  Filled 2015-12-30: qty 1

## 2015-12-30 MED ORDER — CEFAZOLIN IN D5W 1 GM/50ML IV SOLN
1.0000 g | Freq: Three times a day (TID) | INTRAVENOUS | Status: AC
  Administered 2015-12-30 – 2015-12-31 (×2): 1 g via INTRAVENOUS
  Filled 2015-12-30 (×2): qty 50

## 2015-12-30 MED ORDER — HEMOSTATIC AGENTS (NO CHARGE) OPTIME
TOPICAL | Status: DC | PRN
  Administered 2015-12-30: 1 via TOPICAL

## 2015-12-30 MED ORDER — THROMBIN 20000 UNITS EX SOLR
CUTANEOUS | Status: AC
  Filled 2015-12-30: qty 20000

## 2015-12-30 MED ORDER — ROCURONIUM BROMIDE 10 MG/ML (PF) SYRINGE
PREFILLED_SYRINGE | INTRAVENOUS | Status: AC
  Filled 2015-12-30: qty 10

## 2015-12-30 MED ORDER — SUCCINYLCHOLINE CHLORIDE 20 MG/ML IJ SOLN
INTRAMUSCULAR | Status: DC | PRN
  Administered 2015-12-30: 140 mg via INTRAVENOUS

## 2015-12-30 MED ORDER — METFORMIN HCL 500 MG PO TABS: 1000.0000 mg | ORAL_TABLET | Freq: Every day | ORAL | Status: DC

## 2015-12-30 MED ORDER — DIAZEPAM 10 MG PO TABS: ORAL_TABLET | ORAL | 0 refills | Status: DC

## 2015-12-30 MED ORDER — LIDOCAINE-EPINEPHRINE (PF) 1 %-1:200000 IJ SOLN
INTRAMUSCULAR | Status: AC
  Filled 2015-12-30: qty 30

## 2015-12-30 MED ORDER — PHENYLEPHRINE HCL 10 MG/ML IJ SOLN
INTRAVENOUS | Status: DC | PRN
  Administered 2015-12-30: 25 ug/min via INTRAVENOUS

## 2015-12-30 MED ORDER — LACTATED RINGERS IV SOLN: INTRAVENOUS | Status: DC

## 2015-12-30 MED ORDER — ACETAMINOPHEN 10 MG/ML IV SOLN
INTRAVENOUS | Status: AC
  Filled 2015-12-30: qty 100

## 2015-12-30 MED ORDER — SUCCINYLCHOLINE CHLORIDE 200 MG/10ML IV SOSY
PREFILLED_SYRINGE | INTRAVENOUS | Status: AC
  Filled 2015-12-30: qty 10

## 2015-12-30 MED ORDER — INSULIN ASPART 100 UNIT/ML ~~LOC~~ SOLN: 0.0000 [IU] | SUBCUTANEOUS | Status: DC

## 2015-12-30 MED ORDER — METHOCARBAMOL 1000 MG/10ML IJ SOLN
500.0000 mg | Freq: Four times a day (QID) | INTRAVENOUS | Status: DC | PRN
  Filled 2015-12-30: qty 5

## 2015-12-30 MED ORDER — ONDANSETRON HCL 4 MG/2ML IJ SOLN
INTRAMUSCULAR | Status: DC | PRN
  Administered 2015-12-30: 4 mg via INTRAVENOUS

## 2015-12-30 MED ORDER — PANTOPRAZOLE SODIUM 40 MG PO TBEC: 40.0000 mg | DELAYED_RELEASE_TABLET | Freq: Every day | ORAL | Status: DC

## 2015-12-30 MED ORDER — MIDAZOLAM HCL 2 MG/2ML IJ SOLN
INTRAMUSCULAR | Status: AC
  Filled 2015-12-30: qty 2

## 2015-12-30 MED ORDER — METHOCARBAMOL 500 MG PO TABS
500.0000 mg | ORAL_TABLET | Freq: Four times a day (QID) | ORAL | Status: DC | PRN
  Administered 2015-12-30 – 2015-12-31 (×2): 500 mg via ORAL
  Filled 2015-12-30 (×2): qty 1

## 2015-12-30 MED ORDER — ACETAMINOPHEN 10 MG/ML IV SOLN
INTRAVENOUS | Status: DC | PRN
  Administered 2015-12-30: 1000 mg via INTRAVENOUS

## 2015-12-30 MED ORDER — ONDANSETRON HCL 4 MG/2ML IJ SOLN: 4.0000 mg | INTRAMUSCULAR | Status: DC | PRN

## 2015-12-30 MED ORDER — LIDOCAINE HCL (CARDIAC) 20 MG/ML IV SOLN
INTRAVENOUS | Status: DC | PRN
Start: 2015-12-30 — End: 2015-12-30
  Administered 2015-12-30: 60 mg via INTRAVENOUS

## 2015-12-30 MED ORDER — DIAZEPAM 5 MG PO TABS: 10.0000 mg | ORAL_TABLET | Freq: Three times a day (TID) | ORAL | Status: DC | PRN

## 2015-12-30 MED ORDER — OXYCODONE HCL 5 MG PO TABS
10.0000 mg | ORAL_TABLET | ORAL | Status: DC | PRN
  Administered 2015-12-30 – 2015-12-31 (×3): 10 mg via ORAL
  Filled 2015-12-30 (×2): qty 2

## 2015-12-30 MED ORDER — OXYCODONE-ACETAMINOPHEN 10-325 MG PO TABS: 1.0000 | ORAL_TABLET | ORAL | 0 refills | Status: DC | PRN

## 2015-12-30 MED ORDER — SODIUM CHLORIDE 0.9% FLUSH: 3.0000 mL | Freq: Two times a day (BID) | INTRAVENOUS | Status: DC

## 2015-12-30 MED ORDER — INSULIN ASPART 100 UNIT/ML ~~LOC~~ SOLN: 0.0000 [IU] | Freq: Three times a day (TID) | SUBCUTANEOUS | Status: DC

## 2015-12-30 MED ORDER — ONDANSETRON HCL 4 MG PO TABS: 4.0000 mg | ORAL_TABLET | Freq: Three times a day (TID) | ORAL | 0 refills | Status: DC | PRN

## 2015-12-30 MED ORDER — SODIUM CHLORIDE 0.9 % IV SOLN: 250.0000 mL | INTRAVENOUS | Status: DC

## 2015-12-30 MED ORDER — LACTATED RINGERS IV SOLN
INTRAVENOUS | Status: DC
  Administered 2015-12-30 (×2): via INTRAVENOUS

## 2015-12-30 MED ORDER — PHENOL 1.4 % MT LIQD: 1.0000 | OROMUCOSAL | Status: DC | PRN

## 2015-12-30 MED ORDER — EPHEDRINE 5 MG/ML INJ
INTRAVENOUS | Status: AC
  Filled 2015-12-30: qty 10

## 2015-12-30 MED ORDER — INSULIN ASPART 100 UNIT/ML ~~LOC~~ SOLN: 0.0000 [IU] | Freq: Every day | SUBCUTANEOUS | Status: DC

## 2015-12-30 MED ORDER — OXYCODONE HCL 5 MG PO TABS
ORAL_TABLET | ORAL | Status: AC
  Administered 2015-12-30: 10 mg via ORAL
  Filled 2015-12-30: qty 2

## 2015-12-30 MED ORDER — PROPOFOL 10 MG/ML IV BOLUS
INTRAVENOUS | Status: DC | PRN
  Administered 2015-12-30: 170 mg via INTRAVENOUS

## 2015-12-30 MED ORDER — ONDANSETRON HCL 4 MG/2ML IJ SOLN: 4.0000 mg | Freq: Four times a day (QID) | INTRAMUSCULAR | Status: DC | PRN

## 2015-12-30 MED ORDER — PRAVASTATIN SODIUM 80 MG PO TABS
80.0000 mg | ORAL_TABLET | Freq: Every day | ORAL | Status: DC
  Filled 2015-12-30: qty 1

## 2015-12-30 MED ORDER — FENTANYL CITRATE (PF) 100 MCG/2ML IJ SOLN
INTRAMUSCULAR | Status: DC | PRN
  Administered 2015-12-30 (×3): 50 ug via INTRAVENOUS
  Administered 2015-12-30: 100 ug via INTRAVENOUS
  Administered 2015-12-30: 50 ug via INTRAVENOUS

## 2015-12-30 MED ORDER — THROMBIN 20000 UNITS EX SOLR
CUTANEOUS | Status: DC | PRN
  Administered 2015-12-30: 20000 mL via TOPICAL

## 2015-12-30 MED ORDER — LIDOCAINE-EPINEPHRINE (PF) 1 %-1:200000 IJ SOLN
INTRAMUSCULAR | Status: DC | PRN
  Administered 2015-12-30: 4 mL

## 2015-12-30 MED ORDER — HYDROMORPHONE HCL 2 MG/ML IJ SOLN
INTRAMUSCULAR | Status: AC
  Administered 2015-12-30: 1 mg
  Filled 2015-12-30: qty 1

## 2015-12-30 MED ORDER — ROCURONIUM BROMIDE 100 MG/10ML IV SOLN
INTRAVENOUS | Status: DC | PRN
  Administered 2015-12-30 (×2): 10 mg via INTRAVENOUS
  Administered 2015-12-30: 50 mg via INTRAVENOUS
  Administered 2015-12-30 (×2): 20 mg via INTRAVENOUS

## 2015-12-30 MED ORDER — METOPROLOL SUCCINATE ER 50 MG PO TB24
50.0000 mg | ORAL_TABLET | Freq: Every day | ORAL | Status: DC
  Filled 2015-12-30: qty 1

## 2015-12-30 MED ORDER — PROPOFOL 10 MG/ML IV BOLUS
INTRAVENOUS | Status: AC
  Filled 2015-12-30: qty 20

## 2015-12-30 MED ORDER — SUGAMMADEX SODIUM 200 MG/2ML IV SOLN
INTRAVENOUS | Status: DC | PRN
  Administered 2015-12-30: 200 mg via INTRAVENOUS

## 2015-12-30 MED ORDER — LISINOPRIL 40 MG PO TABS
40.0000 mg | ORAL_TABLET | Freq: Every day | ORAL | Status: DC
  Filled 2015-12-30: qty 1
  Filled 2015-12-30: qty 2
  Filled 2015-12-30: qty 1

## 2015-12-30 MED ORDER — OXYCODONE HCL 5 MG PO TABS: 5.0000 mg | ORAL_TABLET | Freq: Once | ORAL | Status: DC | PRN

## 2015-12-30 MED ORDER — DEXAMETHASONE SODIUM PHOSPHATE 10 MG/ML IJ SOLN
INTRAMUSCULAR | Status: AC
  Filled 2015-12-30: qty 1

## 2015-12-30 MED ORDER — SODIUM CHLORIDE 0.9% FLUSH: 3.0000 mL | INTRAVENOUS | Status: DC | PRN

## 2015-12-30 MED ORDER — AMLODIPINE BESYLATE 10 MG PO TABS
10.0000 mg | ORAL_TABLET | Freq: Every day | ORAL | Status: DC
  Filled 2015-12-30: qty 1

## 2015-12-30 MED ORDER — VENLAFAXINE HCL ER 150 MG PO CP24
150.0000 mg | ORAL_CAPSULE | Freq: Two times a day (BID) | ORAL | Status: DC
  Filled 2015-12-30: qty 2
  Filled 2015-12-30 (×2): qty 1

## 2015-12-30 MED ORDER — 0.9 % SODIUM CHLORIDE (POUR BTL) OPTIME
TOPICAL | Status: DC | PRN
  Administered 2015-12-30: 1000 mL

## 2015-12-30 MED ORDER — EPHEDRINE SULFATE 50 MG/ML IJ SOLN
INTRAMUSCULAR | Status: DC | PRN
  Administered 2015-12-30: 5 mg via INTRAVENOUS

## 2015-12-30 MED ORDER — FENTANYL CITRATE (PF) 100 MCG/2ML IJ SOLN
INTRAMUSCULAR | Status: AC
  Filled 2015-12-30: qty 4

## 2015-12-30 MED ORDER — HYDROCHLOROTHIAZIDE 25 MG PO TABS
12.5000 mg | ORAL_TABLET | Freq: Every day | ORAL | Status: DC
  Filled 2015-12-30: qty 1

## 2015-12-30 MED ORDER — OXYCODONE HCL 5 MG/5ML PO SOLN: 5.0000 mg | Freq: Once | ORAL | Status: DC | PRN

## 2015-12-30 MED ORDER — FENTANYL CITRATE (PF) 100 MCG/2ML IJ SOLN
INTRAMUSCULAR | Status: AC
  Filled 2015-12-30: qty 2

## 2015-12-30 MED ORDER — DEXAMETHASONE SODIUM PHOSPHATE 10 MG/ML IJ SOLN
INTRAMUSCULAR | Status: DC | PRN
  Administered 2015-12-30: 10 mg via INTRAVENOUS

## 2015-12-30 MED ORDER — METFORMIN HCL 500 MG PO TABS: 500.0000 mg | ORAL_TABLET | Freq: Every day | ORAL | Status: DC

## 2015-12-30 MED ORDER — MENTHOL 3 MG MT LOZG: 1.0000 | LOZENGE | OROMUCOSAL | Status: DC | PRN

## 2015-12-30 MED ORDER — GABAPENTIN 100 MG PO CAPS
100.0000 mg | ORAL_CAPSULE | Freq: Three times a day (TID) | ORAL | Status: DC
  Administered 2015-12-30: 100 mg via ORAL
  Filled 2015-12-30: qty 1

## 2015-12-30 MED ORDER — HYDROMORPHONE HCL 1 MG/ML IJ SOLN: 0.2500 mg | INTRAMUSCULAR | Status: DC | PRN

## 2015-12-30 SURGICAL SUPPLY — 65 items
APL SKNCLS STERI-STRIP NONHPOA (GAUZE/BANDAGES/DRESSINGS)
BENZOIN TINCTURE PRP APPL 2/3 (GAUZE/BANDAGES/DRESSINGS) IMPLANT
BLADE SURG ROTATE 9660 (MISCELLANEOUS) IMPLANT
BUR EGG ELITE 4.0 (BURR) IMPLANT
BUR EGG ELITE 4.0MM (BURR)
BUR MATCHSTICK NEURO 3.0 LAGG (BURR) IMPLANT
CAGE SPNL 6D 14XMED 16X7X (Cage) IMPLANT
CANISTER SUCTION 2500CC (MISCELLANEOUS) ×3 IMPLANT
CLOSURE STERI-STRIP 1/2X4 (GAUZE/BANDAGES/DRESSINGS) ×1
CLOSURE WOUND 1/2 X4 (GAUZE/BANDAGES/DRESSINGS) ×1
CLSR STERI-STRIP ANTIMIC 1/2X4 (GAUZE/BANDAGES/DRESSINGS) ×2 IMPLANT
COVER SURGICAL LIGHT HANDLE (MISCELLANEOUS) ×6 IMPLANT
CRADLE DONUT ADULT HEAD (MISCELLANEOUS) ×3 IMPLANT
DRAPE C-ARM 42X72 X-RAY (DRAPES) ×3 IMPLANT
DRAPE POUCH INSTRU U-SHP 10X18 (DRAPES) ×3 IMPLANT
DRAPE SURG 17X23 STRL (DRAPES) ×3 IMPLANT
DRAPE U-SHAPE 47X51 STRL (DRAPES) ×6 IMPLANT
DRSG MEPILEX BORDER 4X4 (GAUZE/BANDAGES/DRESSINGS) ×3 IMPLANT
ELECT COATED BLADE 2.86 ST (ELECTRODE) ×3 IMPLANT
ELECT PENCIL ROCKER SW 15FT (MISCELLANEOUS) ×3 IMPLANT
ELECT REM PT RETURN 9FT ADLT (ELECTROSURGICAL) ×3
ELECTRODE REM PT RTRN 9FT ADLT (ELECTROSURGICAL) ×1 IMPLANT
FUSION TCS NANOLOCK 7MM 6DEG (Cage) ×3 IMPLANT
GLOVE BIO SURGEON STRL SZ 6.5 (GLOVE) ×2 IMPLANT
GLOVE BIO SURGEONS STRL SZ 6.5 (GLOVE) ×1
GLOVE BIOGEL PI IND STRL 6.5 (GLOVE) ×1 IMPLANT
GLOVE BIOGEL PI IND STRL 8.5 (GLOVE) ×1 IMPLANT
GLOVE BIOGEL PI INDICATOR 6.5 (GLOVE) ×2
GLOVE BIOGEL PI INDICATOR 8.5 (GLOVE) ×2
GLOVE SS BIOGEL STRL SZ 8.5 (GLOVE) ×2 IMPLANT
GLOVE SUPERSENSE BIOGEL SZ 8.5 (GLOVE) ×4
GOWN STRL REUS W/ TWL XL LVL3 (GOWN DISPOSABLE) ×2 IMPLANT
GOWN STRL REUS W/TWL 2XL LVL3 (GOWN DISPOSABLE) ×6 IMPLANT
GOWN STRL REUS W/TWL XL LVL3 (GOWN DISPOSABLE) ×6
KIT BASIN OR (CUSTOM PROCEDURE TRAY) ×3 IMPLANT
KIT ROOM TURNOVER OR (KITS) ×3 IMPLANT
NDL SPNL 18GX3.5 QUINCKE PK (NEEDLE) ×1 IMPLANT
NEEDLE SPNL 18GX3.5 QUINCKE PK (NEEDLE) ×3 IMPLANT
NS IRRIG 1000ML POUR BTL (IV SOLUTION) ×3 IMPLANT
PACK ORTHO CERVICAL (CUSTOM PROCEDURE TRAY) ×3 IMPLANT
PACK UNIVERSAL I (CUSTOM PROCEDURE TRAY) ×3 IMPLANT
PAD ARMBOARD 7.5X6 YLW CONV (MISCELLANEOUS) ×6 IMPLANT
PATTIES SURGICAL .25X.25 (GAUZE/BANDAGES/DRESSINGS) IMPLANT
PATTIES SURGICAL .5 X.5 (GAUZE/BANDAGES/DRESSINGS) ×2 IMPLANT
PIN DISTRACTION 14 (PIN) ×4 IMPLANT
PUTTY BONE DBX 2.5 MIS (Bone Implant) ×2 IMPLANT
RESTRAINT LIMB HOLDER UNIV (RESTRAINTS) ×3 IMPLANT
SCREW ENDO BONE 3.8X14MM (Screw) ×2 IMPLANT
SCREW LOCKING 14MMX3.5MM (Screw) ×2 IMPLANT
SPONGE INTESTINAL PEANUT (DISPOSABLE) ×6 IMPLANT
SPONGE LAP 4X18 X RAY DECT (DISPOSABLE) IMPLANT
SPONGE SURGIFOAM ABS GEL 100 (HEMOSTASIS) ×3 IMPLANT
STRIP CLOSURE SKIN 1/2X4 (GAUZE/BANDAGES/DRESSINGS) ×1 IMPLANT
SURGIFLO W/THROMBIN 8M KIT (HEMOSTASIS) ×2 IMPLANT
SUT BONE WAX W31G (SUTURE) ×3 IMPLANT
SUT MON AB 3-0 SH 27 (SUTURE) ×3
SUT MON AB 3-0 SH27 (SUTURE) ×1 IMPLANT
SUT VIC AB 2-0 CT1 36 (SUTURE) ×3 IMPLANT
SYR CONTROL 10ML LL (SYRINGE) ×3 IMPLANT
TAPE CLOTH 4X10 WHT NS (GAUZE/BANDAGES/DRESSINGS) ×3 IMPLANT
TAPE UMBILICAL COTTON 1/8X30 (MISCELLANEOUS) ×4 IMPLANT
TOWEL OR 17X24 6PK STRL BLUE (TOWEL DISPOSABLE) ×3 IMPLANT
TOWEL OR 17X26 10 PK STRL BLUE (TOWEL DISPOSABLE) ×3 IMPLANT
TRAY FOLEY CATH 16FRSI W/METER (SET/KITS/TRAYS/PACK) IMPLANT
WATER STERILE IRR 1000ML POUR (IV SOLUTION) ×3 IMPLANT

## 2015-12-30 NOTE — H&P (Signed)
History of Present Illness  The patient is a 60 year old male who comes in today for a preoperative History and Physical. The patient is scheduled for a ACDF C6-C7 to be performed by Dr. Duane Lope D. Rolena Infante, MD at [facility] on 12/30/2015 . Please see the hospital record for complete dictated history and physical. 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 Pt has diabetes. He is on metformin and reports it is well controlled. Pt is diagnosed with sleep apnea and uses a CPAP. Pt chews tobacco but says he can stop. Explained it will effect fusion.   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 AmLODIPine Besylate (10MG  Tablet, Oral) Active. BuPROPion HCl ER (XL) (300MG  Tablet ER 24HR, Oral) Active. Hydrochlorothiazide (25MG  Tablet, Oral) Active. Klor-Con M20 [facility] Tablet ER, Oral) Active. Lisinopril (40MG  Tablet, Oral) Active. MetFORMIN HCl (500MG  Tablet, Oral) Active. Metoprolol Succinate ER (50MG  Tablet ER 24HR, Oral) Active. Pantoprazole Sodium (40MG  Tablet DR, Oral) Active. Pravastatin Sodium (80MG  Tablet, Oral)  Active. Venlafaxine HCl ER (150MG  Capsule ER 24HR, Oral) Active. Medications Reconciled  Past Surgical History  Neck Disc Surgery   Other Problems Diabetes Mellitus, Type II  Sleep Apnea  High blood pressure  Hypercholesterolemia   Vitals  12/25/2015 9:57 AM Weight: 223 lb Height: 65in Body Surface Area: 2.07 m Body Mass Index: 37.11 kg/m  Temp.: 98.75F(Oral)  Pulse: 60 (Regular)  BP: 144/87 (Sitting, Left Arm, Standard)  General General Appearance-Not in acute distress. Orientation-Oriented X3. Build & Nutrition-Well nourished and Well developed.  Integumentary General Characteristics Surgical Scars - no surgical scar evidence of previous cervical surgery. Cervical Spine-Skin examination of the cervical spine is without deformity, skin lesions, lacerations or abrasions.  Chest and Lung Exam Auscultation Breath sounds - Normal and Clear.  Cardiovascular Auscultation Rhythm - Regular rate and rhythm.  Peripheral Vascular Upper Extremity Palpation - Radial pulse - Bilateral - 2+.  Neurologic Sensation Upper Extremity - Bilateral - sensation is intact in the upper extremity. Reflexes Biceps Reflex - Bilateral - 1+. Brachioradialis Reflex - Bilateral - 1+. Triceps Reflex - Bilateral - 1+. Hoffman's Sign - Bilateral - Hoffman's sign not present.  Musculoskeletal Spine/Ribs/Pelvis  Cervical Spine : Inspection and Palpation - Tenderness - no soft tissue tenderness to palpation and no bony tenderness to palpation, bony/soft tissue palpation of the cervical spine and shoulders does not recreate their typical pain. Strength and Tone: Strength: Strength: Strength - Deltoid - Bilateral - 5/5. Biceps - Bilateral - 5/5. Right - 5/5. Triceps - Left - 4+/5. Wrist Extension - Bilateral - 5/5. Hand Grip - Bilateral - 5/5. Heel walk - Bilateral - able to heel walk without difficulty. Toe Walk - Bilateral - able to walk on toes  without difficulty.  Heel-Toe Walk - Bilateral - able to heel-toe walk without difficulty. ROM - Flexion - Moderately Decreased and painful. Extension - Moderately Decreased and painful. Left Lateral Flexion - Moderately Decreased and painful. Right Lateral Flexion - Moderately Decreased and painful. Left Rotation - Moderately Decreased and painful. Right Rotation - Moderately Decreased and painful. Pain - . Cervical Spine - Special Testing - axial compression test negative, cross chest impingement test negative. Non-Anatomic Signs - No non-anatomic signs present. Upper Extremity Range of Motion - No truesholder pain with IR/ER of the shoulders.  RADIOGRAPHS At this point in time, his x-rays and MRI were reviewed. X-rays done on 08/25/2015. They demonstrate anterior traction spur formation at multiple levels at 3-4, 4-5, 6-7. There is an anterior cervical plate from a previous 5-6 ACDF. MRI from 09/03/2015 shows no cord signal changes. He has moderate flattening of the thecal sac and a left foraminal lateral recess hard disc osteophyte causing compression of the left C7 nerve root. He has no significant neural compression at the 5-6 level. Mild to moderate left foraminal stenosis at 4-5 and moderate to severe on the right at 4-5.   Assessment & Plan  Anterior cervical fusion:Risks of surgery include, but are not limited to: Throat pain, swallowing difficulty, hoarseness or change in voice, death, stroke, paralysis, nerve root damage/injury, bleeding, blood clots, loss of bowel/bladder control, hardware failure, or mal-position, spinal fluid leak, adjacent segment disease, non-union, need for further surgery, ongoing or worse pain, infection. Post-operative bleeding or swelling that could require emergent surgery.  Goal Of Surgery: Discussed that goal of surgery is to reduce pain and improve function and quality of life. Patient is aware that despite all appropriate treatment that there pain and function could be the same,  worse, or different.  At this point in time, the patient has complaints of hand and wrist pain and numbness on the right side and radicular arm pain on the left side. His clinical exam is consistent with C7 nerve radicular symptoms and this correlates with his MRI with findings at 6-7.  He has been dealing with this neck for almost seven to eight months and he has not gotten better and in fact, he feels as though he is deteriorating. Given the motor deficits and the MRI correlation, I think it is reasonable to move forward with surgery Pre-op ENT evaluation indicated no contra-indication to contralateral approach.

## 2015-12-30 NOTE — Transfer of Care (Signed)
Immediate Anesthesia Transfer of Care Note  Patient: Shawn [name].  Procedure(s) Performed: Procedure(s): ANTERIOR CERVICAL DECOMPRESSION/DISCECTOMY FUSION C6 - C7 1 LEVEL (N/A)  Patient Location: PACU  Anesthesia Type:General  Level of Consciousness: awake  Airway & Oxygen Therapy: Patient Spontanous Breathing and Patient connected to nasal cannula oxygen  Post-op Assessment: Report given to RN and Post -op Vital signs reviewed and stable  Post vital signs: Reviewed and stable  Last Vitals:  Vitals:   12/30/15 1241  BP: (!) 165/106  Pulse: (!) 53  Resp: 18  Temp: 36.4 C    Last Pain:  Vitals:   12/30/15 1241  TempSrc: Oral      Patients Stated Pain Goal: 3 ([id number])  Complications: No apparent anesthesia complications

## 2015-12-30 NOTE — Brief Op Note (Signed)
12/30/2015  7:27 PM  PATIENT:  Shawn [name].  60 y.o. male  PRE-OPERATIVE DIAGNOSIS:  C6 -C7 Left HNP with Radiculopathy  POST-OPERATIVE DIAGNOSIS:  C6 -C7 Left HNP with Radiculopathy  PROCEDURE:  Procedure(s): ANTERIOR CERVICAL DECOMPRESSION/DISCECTOMY FUSION C6 - C7 1 LEVEL (N/A)  SURGEON:  Surgeon(s) and Role:    * Melina Schools, MD - Primary  PHYSICIAN ASSISTANT:   ASSISTANTS: none   ANESTHESIA:   general  EBL:  No intake/output data recorded.  BLOOD ADMINISTERED:none  DRAINS: none   LOCAL MEDICATIONS USED:  MARCAINE     SPECIMEN:  No Specimen  DISPOSITION OF SPECIMEN:  N/A  COUNTS:  YES  TOURNIQUET:  * No tourniquets in log *  DICTATION: .Other Dictation: Dictation Number [id number]  PLAN OF CARE: Admit for overnight observation  PATIENT DISPOSITION:  PACU - hemodynamically stable.

## 2015-12-30 NOTE — Anesthesia Preprocedure Evaluation (Addendum)
Anesthesia Evaluation  Patient identified by MRN, date of birth, ID band Patient awake    Reviewed: Allergy & Precautions, H&P , NPO status , Patient's Chart, lab work & pertinent test results  Airway Mallampati: III  TM Distance: >3 FB Neck ROM: Limited    Dental  (+) Lower Dentures, Upper Dentures, Dental Advisory Given   Pulmonary sleep apnea and Continuous Positive Airway Pressure Ventilation ,    breath sounds clear to auscultation       Cardiovascular hypertension, Pt. on medications  Rhythm:regular Rate:Normal  Cath 2013: widely patent coronaries.   Neuro/Psych  Headaches, Anxiety Depression Memory issues post TIA per patient's wife. TIACVA, Residual Symptoms    GI/Hepatic (+) Cirrhosis       ,   Endo/Other  diabetes, Type 2obese  Renal/GU      Musculoskeletal   Abdominal   Peds  Hematology   Anesthesia Other Findings   Reproductive/Obstetrics                           Anesthesia Physical Anesthesia Plan  ASA: III  Anesthesia Plan: General   Post-op Pain Management:    Induction: Intravenous  Airway Management Planned: Oral ETT  Additional Equipment:   Intra-op Plan:   Post-operative Plan: Extubation in OR  Informed Consent: I have reviewed the patients History and Physical, chart, labs and discussed the procedure including the risks, benefits and alternatives for the proposed anesthesia with the patient or authorized representative who has indicated his/her understanding and acceptance.     Plan Discussed with: CRNA, Anesthesiologist and Surgeon  Anesthesia Plan Comments:         Anesthesia Quick Evaluation

## 2015-12-30 NOTE — Anesthesia Procedure Notes (Signed)
Procedure Name: Intubation Date/Time: 12/30/2015 3:56 PM Performed by: Jenne Campus Pre-anesthesia Checklist: Patient identified, Emergency Drugs available, Suction available and Patient being monitored Patient Re-evaluated:Patient Re-evaluated prior to inductionOxygen Delivery Method: Circle System Utilized Preoxygenation: Pre-oxygenation with 100% oxygen Intubation Type: IV induction Ventilation: Mask ventilation without difficulty Grade View: Grade I Tube type: Oral Tube size: 7.5 mm Number of attempts: 1 Airway Equipment and Method: Oral airway,  Video-laryngoscopy and Rigid stylet Placement Confirmation: ETT inserted through vocal cords under direct vision,  positive ETCO2 and breath sounds checked- equal and bilateral Secured at: 22 cm Tube secured with: Tape Dental Injury: Teeth and Oropharynx as per pre-operative assessment  Difficulty Due To: Difficulty was unanticipated, Difficult Airway- due to reduced neck mobility and Difficult Airway- due to large tongue Future Recommendations: Recommend- induction with short-acting agent, and alternative techniques readily available Comments: Elective video-glide d/t patient's reduced neck mobility. Patient had a previous ACDF and current admission for ACDF at different level. Smooth IV induction. EZ mask with OA. DL x 1 #4 glide. Grade 1 view. Atraut intubation. +ETCO2. bbse.

## 2015-12-30 NOTE — Discharge Instructions (Signed)

## 2015-12-31 ENCOUNTER — Encounter (HOSPITAL_COMMUNITY): Payer: Self-pay | Admitting: Orthopedic Surgery

## 2015-12-31 DIAGNOSIS — M4722 Other spondylosis with radiculopathy, cervical region: Secondary | ICD-10-CM | POA: Diagnosis not present

## 2015-12-31 LAB — GLUCOSE, CAPILLARY: GLUCOSE-CAPILLARY: 123 mg/dL — AB (ref 65–99)

## 2015-12-31 NOTE — Progress Notes (Signed)
Pt doing well. Pt and wife given D/C instructions with Rx's, verbal understanding was provided. Pt's incision is clean and dry with no sign of infection. Pt's IV was removed prior to D/C. Pt D/C'd home via wheelchair @ 1030 per MD order. Pt is stable @ D/C and has no other needs at this time. Waldron Gerry, RN  

## 2015-12-31 NOTE — Evaluation (Signed)
Physical Therapy Evaluation Patient Details Name: Shawn [name]. MRN: [id number] DOB: 09-Dec-1955 Today's Date: 12/31/2015   History of Present Illness  Pt is a 60 y/o male who presents s/p C6-C7 ACDF on 12/30/15.  Clinical Impression  Patient evaluated by Physical Therapy with no further acute PT needs identified. All education has been completed and the patient has no further questions. At the time of PT eval pt was able to perform transfers and ambulation with modified independence. See below for any follow-up Physical Therapy or equipment needs. PT is signing off. Thank you for this referral.     Follow Up Recommendations No PT follow up;Supervision - Intermittent    Equipment Recommendations  None recommended by PT    Recommendations for Other Services       Precautions / Restrictions Precautions Precautions: Fall;Cervical Precaution Comments: Handout provided and discussed with pt.  Required Braces or Orthoses: Cervical Brace Cervical Brace: Hard collar;At all times Restrictions Weight Bearing Restrictions: No      Mobility  Bed Mobility Overal bed mobility: Modified Independent                Transfers Overall transfer level: Modified independent Equipment used: None                Ambulation/Gait Ambulation/Gait assistance: Modified independent (Device/Increase time) Ambulation Distance (Feet): 200 Feet Assistive device: None Gait Pattern/deviations: WFL(Within Functional Limits) Gait velocity: Decreased Gait velocity interpretation: Below normal speed for age/gender General Gait Details: Slow but steady gait. No assist required.   Stairs Stairs: Yes Stairs assistance: Supervision Stair Management: One rail Left;Step to pattern;Forwards Number of Stairs: 3 General stair comments: VC's for general safety.   Wheelchair Mobility    Modified Rankin (Stroke Patients Only)       Balance Overall balance assessment: No apparent balance  deficits (not formally assessed)                                           Pertinent Vitals/Pain Pain Assessment: Faces Faces Pain Scale: Hurts even more Pain Location: Incision site Pain Descriptors / Indicators: Operative site guarding Pain Intervention(s): Limited activity within patient's tolerance;Monitored during session;Repositioned    Home Living Family/patient expects to be discharged to:: Private residence Living Arrangements: Spouse/significant other Available Help at Discharge: Family;Available 24 hours/day Type of Home: House Home Access: Stairs to enter Entrance Stairs-Rails: Left Entrance Stairs-Number of Steps: 3 Home Layout: One level Home Equipment: Cane - single point      Prior Function Level of Independence: Independent               Hand Dominance        Extremity/Trunk Assessment   Upper Extremity Assessment: Defer to OT evaluation           Lower Extremity Assessment: Overall WFL for tasks assessed      Cervical / Trunk Assessment: Other exceptions  Communication   Communication: No difficulties  Cognition Arousal/Alertness: Awake/alert Behavior During Therapy: WFL for tasks assessed/performed Overall Cognitive Status: Within Functional Limits for tasks assessed                      General Comments      Exercises     Assessment/Plan    PT Assessment Patent does not need any further PT services  PT Problem List  PT Treatment Interventions      PT Goals (Current goals can be found in the Care Plan section)  Acute Rehab PT Goals PT Goal Formulation: All assessment and education complete, DC therapy    Frequency     Barriers to discharge        Co-evaluation               End of Session Equipment Utilized During Treatment: Cervical collar Activity Tolerance: Patient tolerated treatment well Patient left: with call bell/phone within reach;Other (comment) (Sitting EOB  with OT present) Nurse Communication: Mobility status    Functional Assessment Tool Used: Clinical judgement Functional Limitation: Mobility: Walking and moving around Mobility: Walking and Moving Around Current Status [id number]): At least 1 percent but less than 20 percent impaired, limited or restricted Mobility: Walking and Moving Around Goal Status [phone]): At least 1 percent but less than 20 percent impaired, limited or restricted Mobility: Walking and Moving Around Discharge Status [phone]): At least 1 percent but less than 20 percent impaired, limited or restricted    Time: 0816-0837 PT Time Calculation (min) (ACUTE ONLY): 21 min   Charges:   PT Evaluation $PT Eval Moderate Complexity: 1 Procedure     PT G Codes:   PT G-Codes **NOT FOR INPATIENT CLASS** Functional Assessment Tool Used: Clinical judgement Functional Limitation: Mobility: Walking and moving around Mobility: Walking and Moving Around Current Status [id number]): At least 1 percent but less than 20 percent impaired, limited or restricted Mobility: Walking and Moving Around Goal Status [phone]): At least 1 percent but less than 20 percent impaired, limited or restricted Mobility: Walking and Moving Around Discharge Status [phone]): At least 1 percent but less than 20 percent impaired, limited or restricted    Rolinda Roan 12/31/2015, 9:18 AM   Rolinda Roan, PT, DPT Acute Rehabilitation Services Pager: [phone]

## 2015-12-31 NOTE — Op Note (Signed)
NAMEAIME, BABBS NO.:  [id number]  MEDICAL RECORD NO.:  [id number]  LOCATION:                           FACILITY:  PHYSICIAN:  Keishawn Rajewski D. Germaine Shenker, M.D.DATE OF BIRTH:  October 10, 1955  DATE OF PROCEDURE: DATE OF DISCHARGE:                              OPERATIVE REPORT   PREOPERATIVE DIAGNOSIS:  Adjacent segment cervical spondylitic radiculopathy.  POSTOPERATIVE DIAGNOSIS:  Adjacent segment cervical spondylitic radiculopathy.  OPERATIVE PROCEDURE:  Anterior cervical diskectomy and fusion at C6-7.  HISTORY:  This is a very pleasant gentleman, who had a C5-6 ACDF several years ago and has done well.  He recently developed severe radicular left C7 pain and failed to improve with conservative measures.  As a result, we elected to proceed with surgery.  All appropriate risks, benefits, and alternatives were discussed and consent was obtained.  OPERATIVE NOTE:  The patient was brought to the operating room and placed supine on the operating table.  After successful induction of general anesthesia and endotracheal intubation, TEDs and SCDs were applied.  The arms were tucked at the side, and the anterior cervical spine was prepped and draped in a standard fashion.  Time-out was taken confirming the patient, procedure, and all other pertinent important data.  Once this was done, I identified the C6-7 level using fluoroscopy and marked out my transverse incision site. This was infiltrated with 0.25% Marcaine, and then, a transverse incision was made starting from the midline and proceeding to the left. Sharp dissection was carried out down to the platysma.  The platysma was sharply incised in line with the incision, and then, I began dissecting through the deep cervical fascia.  I identified the omohyoid and released it from its sleeve and continued dissecting sharply through this deep cervical and prevertebral fascia identified.  I then swept the esophagus to the right  with the trachea and then used Kittner dissectors to completely expose the anterior aspect of the spine.  There was significant bony overgrowth of the plate.  At this point, I identified the midportion of the C5-6 plate and used an osteotome to resect the bone.  This allowed me to visualize the C6-7 disk space.  I placed a needle into the 6-7 disk and took an x-ray to confirm I was at the appropriate level.  Once this was done, an annulotomy was performed with a #15 blade scalpel, and I used a combination of pituitary rongeurs and curettes to resect the disk material.  Because of the significant overgrowth, I elected not to try and remove the previous plate placed at 075-GRM.  This would only increase the surgical time and increase the surgical morbidity.  I continued my diskectomy until I was at the posterior aspect of the vertebral body.  Using a 1 mm Kerrison, I resected the posterior osteophyte.  I then used my fine nerve hooks to develop a plane underneath the annulus.  The annulus was resected, and I did remove the hard disk osteophyte from the left side, which allowed me to easily get underneath the uncovertebral joint.  I then decompressed the uncovertebral joint with my 1 mm Kerrison.  At this point, I could now easily distract the intervertebral space.  I trialed devices and  elected to use the Zero-Profile nanoLOCK size #7 medium Titan intervertebral cage packed with DBX mix.  This malleted to the appropriate depth and I had good endplate apposition.  I then secured it with the 2 screws and made sure they were tightened appropriately.  I then irrigated copiously with normal saline and made sure I had hemostasis using bipolar electrocautery and FloSeal.  I then returned the trachea and esophagus to midline, closed the platysma with interrupted 2-0 Vicryl sutures, and the skin with a 3-0 Monocryl.  Steri-Strips and dry dressing were applied, and the patient was ultimately extubated,  transferred to the PACU without incident.  At the end of the case, all needle and sponge counts were correct.  There were no adverse intraoperative events.     Atiya Yera D. Rolena Infante, M.D.   ______________________________ Blake Divine. Rolena Infante, M.D.    DDB/MEDQ  D:  12/30/2015  T:  12/31/2015  Job:  [id number]

## 2015-12-31 NOTE — Evaluation (Signed)
Occupational Therapy Evaluation and Discharge Patient Details Name: Shawn [name]. MRN: [id number] DOB: [birth year] Today's Date: 12/31/2015    History of Present Illness Pt is a 60 y/o male who presents s/p C6-C7 ACDF on 12/30/15.   Clinical Impression   Pt reports he was independent with ADL PTA. Currently pt overall supervision for ADL and functional mobility. All cervical, safety, and ADL education completed. Pt planning to d/c home with 24/7 supervision from his wife. No further acute OT needs identified; signing off at this time. Please re-consult if needs change. Thank you for this referral.     Follow Up Recommendations  No OT follow up;Supervision - Intermittent    Equipment Recommendations  None recommended by OT    Recommendations for Other Services       Precautions / Restrictions Precautions Precautions: Fall;Cervical Precaution Comments: PT provided handout. Reviewed precautions with pt. Required Braces or Orthoses: Cervical Brace Cervical Brace: Hard collar;At all times Restrictions Weight Bearing Restrictions: No      Mobility Bed Mobility Overal bed mobility: Modified Independent             General bed mobility comments: Pt sitting EOB with PT upon arrival.  Transfers Overall transfer level: Modified independent Equipment used: None                  Balance Overall balance assessment: No apparent balance deficits (not formally assessed)                                          ADL Overall ADL's : Needs assistance/impaired Eating/Feeding: Independent;Sitting   Grooming: Supervision/safety;Standing Grooming Details (indicate cue type and reason): Educated on use of 2 cups for oral care. Upper Body Bathing: Set up;Supervision/ safety;Sitting   Lower Body Bathing: Supervison/ safety;Sit to/from stand   Upper Body Dressing : Set up;Sitting   Lower Body Dressing: Supervision/safety;Sit to/from stand   Toilet  Transfer: Supervision/safety;Ambulation;Comfort height toilet       Tub/ Shower Transfer: Supervision/safety;Tub transfer;Ambulation Tub/Shower Transfer Details (indicate cue type and reason): Simulated tub transfer. Educated on supervision for safety with tub transfer initially upon return home. Functional mobility during ADLs: Supervision/safety General ADL Comments: Educated pt on maintaining cervical precautions during functional activities, keeping frequently used items at counter top height, donning/doffing cervical collar, no scrubbing over incision when cleared to shower-pat dry.     Vision     Perception     Praxis      Pertinent Vitals/Pain Pain Assessment: 0-10 Pain Score: 1  Faces Pain Scale: Hurts even more Pain Location: neck Pain Descriptors / Indicators: Sore Pain Intervention(s): Monitored during session;Repositioned     Hand Dominance Right   Extremity/Trunk Assessment Upper Extremity Assessment Upper Extremity Assessment: Overall WFL for tasks assessed   Lower Extremity Assessment Lower Extremity Assessment: Defer to PT evaluation   Cervical / Trunk Assessment Cervical / Trunk Assessment: Other exceptions Cervical / Trunk Exceptions: s/p cervical sx   Communication Communication Communication: No difficulties   Cognition Arousal/Alertness: Awake/alert Behavior During Therapy: WFL for tasks assessed/performed Overall Cognitive Status: Within Functional Limits for tasks assessed                     General Comments       Exercises       Shoulder Instructions      Home Living Family/patient expects to  be discharged to:: Private residence Living Arrangements: Spouse/significant other Available Help at Discharge: Family;Available 24 hours/day Type of Home: House Home Access: Stairs to enter [employer] of Steps: 3 Entrance Stairs-Rails: Left Home Layout: One level     Bathroom Shower/Tub: Tub/shower unit Shower/tub  characteristics: Architectural technologist: Standard Bathroom Accessibility: Yes   Home Equipment: Kasandra Knudsen - single point          Prior Functioning/Environment Level of Independence: Independent                 OT Problem List:     OT Treatment/Interventions:      OT Goals(Current goals can be found in the care plan section) Acute Rehab OT Goals Patient Stated Goal: return home OT Goal Formulation: All assessment and education complete, DC therapy  OT Frequency:     Barriers to D/C:            Co-evaluation              End of Session Equipment Utilized During Treatment: Cervical collar  Activity Tolerance: Patient tolerated treatment well Patient left: in chair;with call bell/phone within reach   Time: [id number] OT Time Calculation (min): 11 min Charges:  OT General Charges $OT Visit: 1 Procedure OT Evaluation $OT Eval Low Complexity: 1 Procedure G-Codes: OT G-codes **NOT FOR INPATIENT CLASS** Functional Assessment Tool Used: Clinical judgement Functional Limitation: Self care Self Care Current Status [id number]): At least 1 percent but less than 20 percent impaired, limited or restricted Self Care Goal Status [id number]): At least 1 percent but less than 20 percent impaired, limited or restricted Self Care Discharge Status [phone]): At least 1 percent but less than 20 percent impaired, limited or restricted   Binnie Kand M.S., OTR/L Pager: [phone]  12/31/2015, 10:04 AM

## 2015-12-31 NOTE — Progress Notes (Signed)
Orthopedic Tech Progress Note Patient Details:  Shawn [name] [birth year] [id number] Joey from [facility] said patient already has Aspen Cervical Collar. Patient ID: [name], male   DOB: [birth year], 60 y.o.   MRN: [id number]   Charlott Rakes 12/31/2015, 8:37 AM

## 2016-01-01 ENCOUNTER — Ambulatory Visit: Payer: BLUE CROSS/BLUE SHIELD | Admitting: "Endocrinology

## 2016-01-01 LAB — HEMOGLOBIN A1C
HEMOGLOBIN A1C: 6.2 % — AB (ref 4.8–5.6)
MEAN PLASMA GLUCOSE: 131 mg/dL

## 2016-01-01 NOTE — Addendum Note (Signed)
Addendum  created 01/01/16 0959 by Nolon Nations, MD   Sign clinical note

## 2016-01-01 NOTE — Anesthesia Postprocedure Evaluation (Addendum)
Anesthesia Post Note  Patient: Shawn [name].  Procedure(s) Performed: Procedure(s) (LRB): ANTERIOR CERVICAL DECOMPRESSION/DISCECTOMY FUSION C6 - C7 1 LEVEL (N/A)  Patient location during evaluation: PACU Anesthesia Type: General Level of consciousness: sedated and patient cooperative Pain management: pain level controlled Vital Signs Assessment: post-procedure vital signs reviewed and stable Respiratory status: spontaneous breathing Cardiovascular status: stable Anesthetic complications: no    Last Vitals:  Vitals:   12/31/15 0400 12/31/15 0803  BP: (!) 138/95 (!) 134/93  Pulse: 81 66  Resp: 20 18  Temp: 36.6 C 36.6 C    Last Pain:  Vitals:   12/31/15 1020  TempSrc:   PainSc: [place]

## 2016-01-20 ENCOUNTER — Other Ambulatory Visit: Payer: Self-pay | Admitting: Family Medicine

## 2016-01-25 NOTE — Discharge Summary (Signed)
Patient ID: Shawn [name]. MRN: [id number] DOB/AGE: 11/06/1955 60 y.o.  Admit date: 12/30/2015 Discharge date: 01/25/2016  Admission Diagnoses:  Active Problems:   Neck pain   Discharge Diagnoses:  Active Problems:   Neck pain  status post Procedure(s): ANTERIOR CERVICAL DECOMPRESSION/DISCECTOMY FUSION C6 - C7 1 LEVEL  Past Medical History:  Diagnosis Date  . ADHD   . Anxiety   . Attention deficit disorder   . Cancer ([place])   . Cirrhosis of liver ([place])   . CVA (cerebral vascular accident) ([place])   . Depression   . Diabetic neuropathy ([place])   . Essential hypertension, benign    Long-standing history, negative secondary workup  . Headache   . History of cardiac catheterization    Widely patent coronary and renal arteries November 2013  . Hyperlipidemia   . Obstructive sleep apnea    cpap  . TIA (transient ischemic attack)    Possible, 2007  . Type 2 diabetes mellitus ([facility])     Surgeries: Procedure(s): ANTERIOR CERVICAL DECOMPRESSION/DISCECTOMY FUSION C6 - C7 1 LEVEL on 12/30/2015   Consultants:   Discharged Condition: Improved  Hospital Course: Shawn [name]. is an 60 y.o. male who was admitted 12/30/2015 for operative treatment of Cervical DDD. Patient failed conservative treatments (please see the history and physical for the specifics) and had severe unremitting pain that affects sleep, daily activities and work/hobbies. After pre-op clearance, the patient was taken to the operating room on 12/30/2015 and underwent  Procedure(s): ANTERIOR CERVICAL DECOMPRESSION/DISCECTOMY FUSION C6 - C7 1 LEVEL.  Pt ambulating in hall.  Pain controlled on oral medication.  Pt urinating w/o difficulty.  Pt cleared by PT for DC home  Patient was given perioperative antibiotics:  Anti-infectives    Start     Dose/Rate Route Frequency Ordered Stop   12/31/15 0000  ceFAZolin (ANCEF) IVPB 1 g/50 mL premix     1 g 100 mL/hr over 30 Minutes Intravenous Every 8 hours  12/30/15 2118 12/31/15 0706   12/30/15 0700  ceFAZolin (ANCEF) 3 g in dextrose 5 % 50 mL IVPB     3 g 130 mL/hr over 30 Minutes Intravenous 30 min pre-op 12/29/15 1359 12/30/15 1600       Patient was given sequential compression devices and early ambulation to prevent DVT.   Patient benefited maximally from hospital stay and there were no complications. At the time of discharge, the patient was urinating/moving their bowels without difficulty, tolerating a regular diet, pain is controlled with oral pain medications and they have been cleared by PT/OT.   Recent vital signs: No data found.    Recent laboratory studies: No results for input(s): WBC, HGB, HCT, PLT, NA, K, CL, CO2, BUN, CREATININE, GLUCOSE, INR, CALCIUM in the last 72 hours.  Invalid input(s): PT, 2   Discharge Medications:     Medication List    STOP taking these medications   aspirin 81 MG tablet   diclofenac 75 MG EC tablet Commonly known as:  VOLTAREN   ibuprofen 200 MG tablet Commonly known as:  ADVIL,MOTRIN     TAKE these medications   amLODipine 10 MG tablet Commonly known as:  NORVASC TAKE 1 TABLET DAILY What changed:  See the new instructions.   blood glucose meter kit and supplies Kit Dispense based on patient and insurance preference. Use daily as directed. (FOR ICD - 10 E11.9)   diazepam 10 MG tablet Commonly known as:  VALIUM TAKE 1 TABLET 3 TIMES  A DAY AS NEEDED for muscle spasms What changed:  additional instructions   gabapentin 100 MG capsule Commonly known as:  NEURONTIN Take 1 capsule (100 mg total) by mouth 3 (three) times daily.   glucose blood test strip Uset to test blood sugar once daily Dx- E11.9   hydrochlorothiazide 12.5 MG tablet Commonly known as:  HYDRODIURIL TAKE 1 TABLET DAILY What changed:  See the new instructions.   lisinopril 40 MG tablet Commonly known as:  PRINIVIL,ZESTRIL TAKE 1 TABLET DAILY What changed:  See the new instructions.   metFORMIN 500 MG  tablet Commonly known as:  GLUCOPHAGE Take 1 tablet po in am and 2 tablets po in the evening What changed:  how much to take  how to take this  when to take this  additional instructions   metoprolol succinate 50 MG 24 hr tablet Commonly known as:  TOPROL-XL TAKE 1 TABLET DAILY; TAKE  WITH OR IMMEDIATELY        FOLLOWING A MEAL What changed:  See the new instructions.   ondansetron 4 MG tablet Commonly known as:  ZOFRAN Take 1 tablet (4 mg total) by mouth every 8 (eight) hours as needed for nausea or vomiting.   ONETOUCH DELICA LANCETS 11B Misc Use to test blood sugar once daily Dx- E11.9   oxyCODONE-acetaminophen 10-325 MG tablet Commonly known as:  PERCOCET Take 1 tablet by mouth every 4 (four) hours as needed for pain.   pantoprazole 40 MG tablet Commonly known as:  PROTONIX TAKE 1 TABLET DAILY What changed:  See the new instructions.   potassium chloride SA 20 MEQ tablet Commonly known as:  KLOR-CON M20 TAKE 2 TABLETS EACH MORNING What changed:  how much to take  how to take this  when to take this  additional instructions   pravastatin 80 MG tablet Commonly known as:  PRAVACHOL Take 1 tablet (80 mg total) by mouth daily.   venlafaxine XR 150 MG 24 hr capsule Commonly known as:  EFFEXOR-XR TAKE 1 CAPSULE TWICE DAILY What changed:  See the new instructions.       Diagnostic Studies: Dg Cervical Spine 2 Or 3 Views  Result Date: 12/31/2015 CLINICAL DATA:  60 year old male status post spinal fusion. EXAM: CERVICAL SPINE - 2-3 VIEW COMPARISON:  Intraoperative fluoroscopy images dated 12/30/2015 FINDINGS: Evaluation is limited due to superimposition of the cervical collar. No definite acute fracture or subluxation. C5-C6 anterior fusion plate and screws, J4-N8 interbody spacer and fixation screws noted. There are degenerative changes with anterior osteophyte. The spinous processes appear intact. Anterior paravertebral soft tissue swelling an air noted,  likely postsurgical. IMPRESSION: Postsurgical changes. No definite acute fracture or subluxation on limited evaluation. Electronically Signed   By: Anner Crete M.D.   On: 12/31/2015 03:42   Dg Cervical Spine 2-3 Views  Result Date: 12/30/2015 CLINICAL DATA:  C6-7 anterior cervical disc fusion EXAM: DG C-ARM GT 120 MIN; CERVICAL SPINE - 2-3 VIEW CONTRAST:  None FLUOROSCOPY TIME:  Fluoroscopy Time:  1 minutes 19 seconds Radiation Exposure Index (if provided by the fluoroscopic device): NA Number of Acquired Spot Images: Two COMPARISON:  09/06/2011 cervical spine radiographs FINDINGS: Anterior cervical screw and plate fixation across C5-6 is chronic. New anterior cervical disc fusion across C6-7 is seen. IMPRESSION: Chronic C5-6 anterior cervical disc fusion with plate and screws. New anterior cervical disc fusion across C6-7. Electronically Signed   By: Ashley Royalty M.D.   On: 12/30/2015 20:58   Dg C-arm Gt 120 Min  Result Date: 12/30/2015 CLINICAL DATA:  C6-7 anterior cervical disc fusion EXAM: DG C-ARM GT 120 MIN; CERVICAL SPINE - 2-3 VIEW CONTRAST:  None FLUOROSCOPY TIME:  Fluoroscopy Time:  1 minutes 19 seconds Radiation Exposure Index (if provided by the fluoroscopic device): NA Number of Acquired Spot Images: Two COMPARISON:  09/06/2011 cervical spine radiographs FINDINGS: Anterior cervical screw and plate fixation across C5-6 is chronic. New anterior cervical disc fusion across C6-7 is seen. IMPRESSION: Chronic C5-6 anterior cervical disc fusion with plate and screws. New anterior cervical disc fusion across C6-7. Electronically Signed   By: Ashley Royalty M.D.   On: 12/30/2015 20:58      Follow-up Information    Dahlia Bailiff, MD. Schedule an appointment as soon as possible for a visit in 2 week(s).   Specialty:  Orthopedic Surgery Why:  If symptoms worsen, For suture removal, For wound re-check Contact information: [address] [place] [address] [place]  [address] [phone]           Discharge Plan:  discharge to home Pt given post op medication Pt will f/u in clinic in 2 weeks  Disposition:     Signed: MayoDarla Lesches for Dr. Melina Schools [facility] [phone] 01/25/2016, 9:40 AM

## 2016-02-03 ENCOUNTER — Telehealth: Payer: Self-pay | Admitting: Family Medicine

## 2016-02-03 NOTE — Telephone Encounter (Signed)
Pt states with  his new cpap machine he doesn't seem to get as much air and also has to sleep with his mouth open and his mouth stays dry. Also see note below about diabetic shoes

## 2016-02-03 NOTE — Telephone Encounter (Signed)
Patient is wanting to know if Dr. Nicki Reaper can recommend diabetic shoes for him.  Also, he has a question on his c-pap machine because he doesn't think his new one is working right.

## 2016-02-03 NOTE — Telephone Encounter (Signed)
In regards to his CPAP machine typically where he got it from will usually have a respiratory therapist who can adjust machine or make sure that it's functioning properly. Please find out who he got this through and the patient should connect with them. #2-as for diabetic shoes typically diabetic shoes or approved for diabetics who have diabetic neuropathy. I am perfectly fine with writing a prescription for diabetic shoes but it may or may not be covered by his insurance company.

## 2016-02-04 NOTE — Telephone Encounter (Signed)
Spoke with patient and informed her per Dr.Scott Luking- In regards to his CPAP machine typically where he got it from will usually have a respiratory therapist who can adjust machine or make sure that it's functioning properly. Please find out who he got this through and the patient should connect with them. #2-as for diabetic shoes typically diabetic shoes or approved for diabetics who have diabetic neuropathy. I am perfectly fine with writing a prescription for diabetic shoes but it may or may not be covered by his insurance company. Patient verbalized understanding.

## 2016-02-11 LAB — HM DIABETES EYE EXAM

## 2016-02-15 ENCOUNTER — Ambulatory Visit: Payer: BLUE CROSS/BLUE SHIELD | Admitting: Nurse Practitioner

## 2016-02-15 ENCOUNTER — Telehealth: Payer: Self-pay | Admitting: Nurse Practitioner

## 2016-02-15 ENCOUNTER — Encounter: Payer: Self-pay | Admitting: Nurse Practitioner

## 2016-02-15 NOTE — Telephone Encounter (Signed)
Noted  

## 2016-02-15 NOTE — Telephone Encounter (Signed)
PT WAS A NO SHOW AND LETTER SENT  °

## 2016-02-22 ENCOUNTER — Other Ambulatory Visit: Payer: Self-pay | Admitting: Family Medicine

## 2016-02-25 ENCOUNTER — Encounter: Payer: Self-pay | Admitting: Pediatrics

## 2016-03-10 ENCOUNTER — Telehealth: Payer: Self-pay | Admitting: Internal Medicine

## 2016-03-10 NOTE — Telephone Encounter (Signed)
Pt called today saying that his wife received a letter from [place] that he had missed his OV back in December. Pt said that he never got the lab results and didn't have his U/S done. He is wanting to speak to the nurse about results and scheduling another ultrasound and if he needed to make an OV after he had this done. Please advise and call [phone]

## 2016-03-10 NOTE — Telephone Encounter (Signed)
Pt id going for [place] on 03/15/16 @ 7:30. He is aware

## 2016-03-14 ENCOUNTER — Encounter: Payer: Self-pay | Admitting: Family Medicine

## 2016-03-14 ENCOUNTER — Ambulatory Visit (INDEPENDENT_AMBULATORY_CARE_PROVIDER_SITE_OTHER): Payer: BLUE CROSS/BLUE SHIELD | Admitting: Family Medicine

## 2016-03-14 VITALS — BP 120/82 | Temp 98.1°F | Ht 66.0 in | Wt 227.1 lb

## 2016-03-14 DIAGNOSIS — K7460 Unspecified cirrhosis of liver: Secondary | ICD-10-CM

## 2016-03-14 DIAGNOSIS — L84 Corns and callosities: Secondary | ICD-10-CM | POA: Diagnosis not present

## 2016-03-14 DIAGNOSIS — I10 Essential (primary) hypertension: Secondary | ICD-10-CM

## 2016-03-14 DIAGNOSIS — G4733 Obstructive sleep apnea (adult) (pediatric): Secondary | ICD-10-CM | POA: Diagnosis not present

## 2016-03-14 DIAGNOSIS — F988 Other specified behavioral and emotional disorders with onset usually occurring in childhood and adolescence: Secondary | ICD-10-CM | POA: Diagnosis not present

## 2016-03-14 DIAGNOSIS — F09 Unspecified mental disorder due to known physiological condition: Secondary | ICD-10-CM | POA: Diagnosis not present

## 2016-03-14 DIAGNOSIS — Z79899 Other long term (current) drug therapy: Secondary | ICD-10-CM | POA: Diagnosis not present

## 2016-03-14 DIAGNOSIS — E119 Type 2 diabetes mellitus without complications: Secondary | ICD-10-CM | POA: Diagnosis not present

## 2016-03-14 MED ORDER — AMPHETAMINE-DEXTROAMPHET ER 10 MG PO CP24: 10.0000 mg | ORAL_CAPSULE | Freq: Every day | ORAL | 0 refills | Status: DC

## 2016-03-14 NOTE — Progress Notes (Addendum)
   Subjective:    Patient ID: Shawn Greaves., male    DOB: 14-Jan-1956, 61 y.o.   MRN: [id number]  Rash  This is a chronic problem. The current episode started more than 1 year ago. The problem has been gradually worsening since onset. The affected locations include the right foot. Rash characteristics: callus. He was exposed to nothing. Pertinent negatives include no cough, fatigue, fever or shortness of breath. Treatments tried: sanding area. The treatment provided no relief.   Patient wants to discuss his disability status.  Patient having let me of other issues Diabetes under fair control he watch his diet unable to do a lot of exercise because of his health issues Blood pressure under good control with medications Has history of ADD was on medication came off medicine because his blood pressure was elevated he states he's having difficult time focusing following through in getting organized he requested be back on medication Patient has sleep apnea uses CPAP machine on a regular basis Patient has cognitive dysfunction with some mild memory issues not severe enough to prevent him from driving but does make it difficult for him to learn new tasks Has cirrhosis of the liver followed by a specialist for this Has severe anxiety anxiousness issues states depression under good control but does use Valium intermittently and also takes antidepressant  Review of Systems  Constitutional: Negative for activity change, fatigue and fever.  Respiratory: Negative for cough and shortness of breath.   Cardiovascular: Negative for chest pain and leg swelling.  Skin: Positive for rash.  Neurological: Negative for headaches.       Objective:   Physical Exam  Constitutional: He appears well-developed and well-nourished. No distress.  HENT:  Head: Normocephalic.  Cardiovascular: Normal rate, regular rhythm and normal heart sounds.   No murmur heard. Pulmonary/Chest: Effort normal and breath sounds  normal.  Neurological: He is alert.  Skin: Skin is warm and dry.  Psychiatric: He has a normal mood and affect. His behavior is normal.          Assessment & Plan:  Referral to podiatry because of pre-ulcerative corn Type 2 diabetes-good control no sign of neuropathy Cirrhosis followed by gastroenterology Sleep apnea uses CPAP machine Hypertension fair control watch diet exercise try to lose weight Obesity encouraged patient lose weight ADD reinitiate medication follow-up within 4-6 weeks Cognitive dysfunction stable  This patient has multiple health issues which can in way of him being able to function well he's also had previous neck surgery and back issues I do not believe this patient is capable of holding her regular job. I do not feel that he can work. I feel he is disabled. He has requested a letter we will work on that.his patient will keep all regular follow-up visits.

## 2016-03-15 ENCOUNTER — Ambulatory Visit (HOSPITAL_COMMUNITY)
Admission: RE | Admit: 2016-03-15 | Discharge: 2016-03-15 | Disposition: A | Discharge status: Home / Self Care | Payer: BLUE CROSS/BLUE SHIELD | Source: Ambulatory Visit | Attending: Nurse Practitioner | Admitting: Nurse Practitioner

## 2016-03-15 DIAGNOSIS — K829 Disease of gallbladder, unspecified: Secondary | ICD-10-CM | POA: Diagnosis not present

## 2016-03-15 DIAGNOSIS — K7460 Unspecified cirrhosis of liver: Secondary | ICD-10-CM | POA: Insufficient documentation

## 2016-03-15 DIAGNOSIS — E669 Obesity, unspecified: Secondary | ICD-10-CM | POA: Diagnosis present

## 2016-03-16 ENCOUNTER — Encounter: Payer: Self-pay | Admitting: Family Medicine

## 2016-03-18 ENCOUNTER — Encounter: Payer: Self-pay | Admitting: Nurse Practitioner

## 2016-03-18 ENCOUNTER — Encounter: Payer: Self-pay | Admitting: Family Medicine

## 2016-03-22 ENCOUNTER — Telehealth (HOSPITAL_COMMUNITY): Payer: Self-pay | Admitting: *Deleted

## 2016-03-22 NOTE — Telephone Encounter (Signed)
left voice message regarding an appointment. 

## 2016-03-23 ENCOUNTER — Other Ambulatory Visit: Payer: Self-pay | Admitting: Family Medicine

## 2016-04-01 ENCOUNTER — Other Ambulatory Visit: Payer: Self-pay | Admitting: Family Medicine

## 2016-04-04 ENCOUNTER — Telehealth (HOSPITAL_COMMUNITY): Payer: Self-pay | Admitting: *Deleted

## 2016-04-04 NOTE — Telephone Encounter (Signed)
phone call, left voice message regarding an appointment. 

## 2016-04-06 ENCOUNTER — Encounter: Payer: Self-pay | Admitting: Nurse Practitioner

## 2016-04-06 ENCOUNTER — Ambulatory Visit (INDEPENDENT_AMBULATORY_CARE_PROVIDER_SITE_OTHER): Payer: BLUE CROSS/BLUE SHIELD | Admitting: Nurse Practitioner

## 2016-04-06 VITALS — BP 143/93 | HR 69 | Temp 98.0°F | Ht 65.0 in | Wt 222.2 lb

## 2016-04-06 DIAGNOSIS — K760 Fatty (change of) liver, not elsewhere classified: Secondary | ICD-10-CM

## 2016-04-06 DIAGNOSIS — K7460 Unspecified cirrhosis of liver: Secondary | ICD-10-CM | POA: Diagnosis not present

## 2016-04-06 NOTE — Progress Notes (Signed)
Referring Provider: [name], MD Primary Care Physician:  Sallee Lange, MD Primary GI:  Dr. Gala Romney  Chief Complaint  Patient presents with  . Cirrhosis    f/u, doing ok    HPI:   Shawn L Dannel Rafter. is a 61 y.o. male who presents For follow-up on non-alcoholic cirrhosis. He has a family history of cirrhosis with autoimmune, viral markers, iron studies all normal deemed likely fatty liver. His disease has been well compensated. He was last seen in our office 08/13/2015 at which point he was doing well. He saw a nutritionist in February 2017 which went well for weight loss assistance. No hepatic symptoms at that time. Labs and imaging were updated. Cirrhosis education was provided. Labs are completed 11/23/2015 which found CBC mostly normal with the exception of a low platelet count at 128, CMP normal from a hepatic and renal standpoint, INR mildly elevated at 1.2, AFP tumor marker high normal at 6.2. Right upper quadrant ultrasound completed 03/15/2016 which found cirrhosis, no evidence of mass or suspicious lesions. Noted gallbladder sludge and newly seen 7 mm echogenic structure favoring stone over polyp.  Today he states he's doing well overall. Denies abdominal pain, N/V, hematochezia, melena, darkened urine, yellowing of skin/eyes, acute episodic confusion. He does have trouble sleeping at times, has discussed with PCP. He's on a CPAP, thinks it's related to extra stress. Denies chest pain, dyspnea, syncope, near syncope. Denies any other upper or lower GI symptoms.  Past Medical History:  Diagnosis Date  . ADHD   . Anxiety   . Attention deficit disorder   . Cancer ([place])   . Cirrhosis of liver ([place])   . CVA (cerebral vascular accident) ([place])   . Depression   . Diabetic neuropathy ([place])   . Essential hypertension, benign    Long-standing history, negative secondary workup  . Headache   . History of cardiac catheterization    Widely patent coronary and renal arteries  November 2013  . Hyperlipidemia   . Obstructive sleep apnea    cpap  . TIA (transient ischemic attack)    Possible, 2007  . Type 2 diabetes mellitus ([place])     Past Surgical History:  Procedure Laterality Date  . ANTERIOR CERVICAL DECOMP/DISCECTOMY FUSION N/A 12/30/2015   Procedure: ANTERIOR CERVICAL DECOMPRESSION/DISCECTOMY FUSION C6 - C7 1 LEVEL;  Surgeon: Melina Schools, MD;  Location: [facility];  Service: Orthopedics;  Laterality: N/A;  . CARPAL TUNNEL RELEASE    . KNEE SURGERY Right   . NECK SURGERY    . VASECTOMY      Current Outpatient Prescriptions  Medication Sig Dispense Refill  . amLODipine (NORVASC) 10 MG tablet TAKE 1 TABLET DAILY 90 tablet 1  . amphetamine-dextroamphetamine (ADDERALL XR) 10 MG 24 hr capsule Take 1 capsule (10 mg total) by mouth daily. 30 capsule 0  . blood glucose meter kit and supplies KIT Dispense based on patient and insurance preference. Use daily as directed. (FOR ICD - 10 E11.9) 1 each 5  . buPROPion (WELLBUTRIN XL) 300 MG 24 hr tablet TAKE 1 TABLET EVERY DAY 90 tablet 1  . diazepam (VALIUM) 10 MG tablet TAKE 1 TABLET 3 TIMES A DAY AS NEEDED for muscle spasms 45 tablet 0  . gabapentin (NEURONTIN) 100 MG capsule Take 1 capsule (100 mg total) by mouth 3 (three) times daily. 270 capsule 1  . glucose blood test strip Uset to test blood sugar once daily Dx- E11.9 100 each 1  . hydrochlorothiazide (HYDRODIURIL)  12.5 MG tablet TAKE 1 TABLET DAILY (Patient taking differently: Take 12.5 mg by mouth daily) 90 tablet 1  . lisinopril (PRINIVIL,ZESTRIL) 40 MG tablet TAKE 1 TABLET DAILY (Patient taking differently: Take 40 mg by mouth daily) 90 tablet 1  . metFORMIN (GLUCOPHAGE) 500 MG tablet Take 1 tablet po in am and 2 tablets po in the evening (Patient taking differently: Take 500-1,000 mg by mouth See admin instructions. Take 500 mg by mouth in the morning and take 1000 mg by mouth in the evening) 270 tablet 1  . metoprolol succinate (TOPROL-XL) 50 MG 24 hr tablet  TAKE 1 TABLET DAILY; TAKE  WITH OR IMMEDIATELY        FOLLOWING A MEAL (Patient taking differently: Take 50 mg by mouth daily) 90 tablet 1  . ondansetron (ZOFRAN) 4 MG tablet Take 1 tablet (4 mg total) by mouth every 8 (eight) hours as needed for nausea or vomiting. 20 tablet 0  . ONETOUCH DELICA LANCETS 64B MISC Use to test blood sugar once daily Dx- E11.9 100 each 1  . pantoprazole (PROTONIX) 40 MG tablet TAKE 1 TABLET DAILY (Patient taking differently: Take 40 mg by mouth daily) 90 tablet 1  . potassium chloride SA (KLOR-CON M20) 20 MEQ tablet TAKE 2 TABLETS EACH MORNING (Patient taking differently: Take 40 mEq by mouth daily. ) 180 tablet 1  . pravastatin (PRAVACHOL) 80 MG tablet Take 1 tablet (80 mg total) by mouth daily. 30 tablet 5  . pravastatin (PRAVACHOL) 80 MG tablet TAKE 1 TABLET DAILY 90 tablet 0  . venlafaxine XR (EFFEXOR-XR) 150 MG 24 hr capsule TAKE 1 CAPSULE TWICE DAILY (Patient taking differently: Take 300 mg by mouth daily) 180 capsule 1  . oxyCODONE-acetaminophen (PERCOCET) 10-325 MG tablet Take 1 tablet by mouth every 4 (four) hours as needed for pain. (Patient not taking: Reported on 04/06/2016) 60 tablet 0   No current facility-administered medications for this visit.     Allergies as of 04/06/2016  . (No Known Allergies)    Family History  Problem Relation Age of Onset  . Colon cancer Mother   . Hypertension Mother   . CAD Mother   . Breast cancer Mother   . Cirrhosis Brother     Social History   Social History  . Marital status: Married    Spouse name: N/A  . Number of children: 4  . Years of education: N/A   Occupational History  . maintainer     Social History Main Topics  . Smoking status: Never Smoker  . Smokeless tobacco: Current User    Types: Snuff     Comment: Uses dip pouches occasionally  . Alcohol use No     Comment: Previously 1-2 cases of beer/week.  . Drug use: No  . Sexual activity: Not Asked   Other Topics Concern  . None    Social History Narrative  . None    Review of Systems: General: Negative for anorexia, weight loss, fever, chills, fatigue, weakness. ENT: Negative for hoarseness, difficulty swallowing. CV: Negative for chest pain, angina, palpitations, peripheral edema.  Respiratory: Negative for dyspnea at rest, cough, sputum, wheezing.  GI: See history of present illness. Derm: Negative for rash or itching.  Neuro: Negative for memory loss, confusion.  Psych: Increased stress and associated anxiety. Endo: Negative for unusual weight change.  Heme: Negative for bruising or bleeding.   Physical Exam: BP (!) 143/93   Pulse 69   Temp 98 F (36.7 C) (Oral)  Ht 5' 5"  (1.651 m)   Wt 222 lb 3.2 oz (100.8 kg)   BMI 36.98 kg/m  General:   Obese male. Alert and oriented. Pleasant and cooperative. Well-nourished and well-developed.  Eyes:  Without icterus, sclera clear and conjunctiva pink.  Ears:  Normal auditory acuity. Cardiovascular:  S1, S2 present without murmurs appreciated. Extremities without clubbing or edema. Respiratory:  Clear to auscultation bilaterally. No wheezes, rales, or rhonchi. No distress.  Gastrointestinal:  +BS, obese but soft, non-tender and non-distended. No HSM noted. No guarding or rebound. No masses appreciated.  Rectal:  Deferred  Musculoskalatal:  Symmetrical without gross deformities. Neurologic:  Alert and oriented x4;  grossly normal neurologically. Psych:  Alert and cooperative. Normal mood and affect. Heme/Lymph/Immune: No excessive bruising noted.    04/06/2016 3:17 PM   Disclaimer: This note was dictated with voice recognition software. Similar sounding words can inadvertently be transcribed and may not be corrected upon review.

## 2016-04-06 NOTE — Assessment & Plan Note (Signed)
Fatty liver as likely etiology of his nonalcoholic liver disease. He is not drinking. Previous discussion and education surrounding fatty liver and prevention of worsening disease by weight management, glycemic control, lipid control, blood pressure control. He is seen a dietitian. He is obese. Overall his liver disease is well compensated, as per below. Return for follow-up in 6 months.

## 2016-04-06 NOTE — Assessment & Plan Note (Signed)
History of relatively newly diagnosed cirrhosis. We have seen him twice in the office. His labs and imaging indicated well compensated disease with a meld of 8 and a child Pugh of A. The etiology of his disease is likely fatty liver. We have previously discussed diet and exercise, glycemic control, low pressure control, lipid control to prevent worsening of his disease. He is generally asymptomatic from a hepatic standpoint today. His ultrasound was just completed a few weeks ago. Today we will update his labs including CBC, CMP, PTT/INR, AFP.

## 2016-04-06 NOTE — Patient Instructions (Signed)
1. Have your labs drawn when you're able to. 2. Return for follow-up in 6 months. 3. Call our office with any questions or concerns.

## 2016-04-07 NOTE — Progress Notes (Signed)
cc'ed to pcp °

## 2016-04-12 ENCOUNTER — Telehealth (HOSPITAL_COMMUNITY): Payer: Self-pay | Admitting: *Deleted

## 2016-04-12 NOTE — Telephone Encounter (Signed)
left voice message regarding an appointment. 

## 2016-04-18 ENCOUNTER — Ambulatory Visit: Payer: BLUE CROSS/BLUE SHIELD | Admitting: Family Medicine

## 2016-04-20 ENCOUNTER — Encounter: Payer: Self-pay | Admitting: Family Medicine

## 2016-04-22 DIAGNOSIS — Z029 Encounter for administrative examinations, unspecified: Secondary | ICD-10-CM

## 2016-04-28 ENCOUNTER — Telehealth (HOSPITAL_COMMUNITY): Payer: Self-pay | Admitting: *Deleted

## 2016-04-28 NOTE — Telephone Encounter (Signed)
left voice message regarding an appointment. 

## 2016-05-05 ENCOUNTER — Telehealth (HOSPITAL_COMMUNITY): Payer: Self-pay | Admitting: *Deleted

## 2016-05-05 NOTE — Telephone Encounter (Signed)
left voice message regarding an appointment. 

## 2016-05-08 ENCOUNTER — Other Ambulatory Visit: Payer: Self-pay | Admitting: Family Medicine

## 2016-05-10 ENCOUNTER — Other Ambulatory Visit: Payer: Self-pay

## 2016-05-26 ENCOUNTER — Telehealth (HOSPITAL_COMMUNITY): Payer: Self-pay | Admitting: *Deleted

## 2016-05-26 NOTE — Telephone Encounter (Signed)
left voice message on both phones regarding an appointment.

## 2016-05-30 ENCOUNTER — Other Ambulatory Visit: Payer: Self-pay | Admitting: Family Medicine

## 2016-06-15 ENCOUNTER — Other Ambulatory Visit: Payer: Self-pay | Admitting: Family Medicine

## 2016-06-21 ENCOUNTER — Encounter: Payer: Self-pay | Admitting: Family Medicine

## 2016-06-21 ENCOUNTER — Ambulatory Visit: Payer: BLUE CROSS/BLUE SHIELD | Admitting: Family Medicine

## 2016-06-21 ENCOUNTER — Ambulatory Visit (INDEPENDENT_AMBULATORY_CARE_PROVIDER_SITE_OTHER): Payer: BLUE CROSS/BLUE SHIELD | Admitting: Family Medicine

## 2016-06-21 VITALS — BP 122/82 | Temp 98.0°F | Ht 65.0 in | Wt 215.4 lb

## 2016-06-21 DIAGNOSIS — R197 Diarrhea, unspecified: Secondary | ICD-10-CM

## 2016-06-21 DIAGNOSIS — I10 Essential (primary) hypertension: Secondary | ICD-10-CM | POA: Diagnosis not present

## 2016-06-21 DIAGNOSIS — F988 Other specified behavioral and emotional disorders with onset usually occurring in childhood and adolescence: Secondary | ICD-10-CM | POA: Diagnosis not present

## 2016-06-21 DIAGNOSIS — E784 Other hyperlipidemia: Secondary | ICD-10-CM

## 2016-06-21 DIAGNOSIS — E119 Type 2 diabetes mellitus without complications: Secondary | ICD-10-CM | POA: Diagnosis not present

## 2016-06-21 DIAGNOSIS — E7849 Other hyperlipidemia: Secondary | ICD-10-CM

## 2016-06-21 MED ORDER — DIAZEPAM 10 MG PO TABS: ORAL_TABLET | ORAL | 4 refills | Status: DC

## 2016-06-21 MED ORDER — DIAZEPAM 10 MG PO TABS: ORAL_TABLET | ORAL | 0 refills | Status: DC

## 2016-06-21 MED ORDER — PANTOPRAZOLE SODIUM 40 MG PO TBEC: 40.0000 mg | DELAYED_RELEASE_TABLET | Freq: Every day | ORAL | 3 refills | Status: DC

## 2016-06-21 MED ORDER — AMPHETAMINE-DEXTROAMPHET ER 10 MG PO CP24: 10.0000 mg | ORAL_CAPSULE | Freq: Every day | ORAL | 0 refills | Status: DC

## 2016-06-21 MED ORDER — VENLAFAXINE HCL ER 150 MG PO CP24: 150.0000 mg | ORAL_CAPSULE | Freq: Two times a day (BID) | ORAL | 1 refills | Status: DC

## 2016-06-21 MED ORDER — HYDROCHLOROTHIAZIDE 12.5 MG PO TABS: 12.5000 mg | ORAL_TABLET | Freq: Every day | ORAL | 3 refills | Status: DC

## 2016-06-21 MED ORDER — LISINOPRIL 40 MG PO TABS: 40.0000 mg | ORAL_TABLET | Freq: Every day | ORAL | 1 refills | Status: DC

## 2016-06-21 MED ORDER — PRAVASTATIN SODIUM 80 MG PO TABS: 80.0000 mg | ORAL_TABLET | Freq: Every day | ORAL | 5 refills | Status: DC

## 2016-06-21 NOTE — Progress Notes (Signed)
   Subjective:    Patient ID: Shawn Greaves., male    DOB: 1956/01/19, 61 y.o.   MRN: [id number]  Diarrhea   This is a new problem. The current episode started in the past 7 days. Associated symptoms include headaches. Pertinent negatives include no abdominal pain, coughing or fever. Associated symptoms comments: Congestion .   This patient had ran out of his medications for several days in he stated he started feeling bad then started feeling nervous then nauseous with diarrhea-he was able to get his medications filled he took his medicines then he is starting to feel better.   He does have a history of depression is under good control currently. He also has a history of high blood pressure Good reading today Patient with ADD takes his medicine helps him with his focus Has a history of fatty liver and cirrhosis followed by gastroenterology History hyperlipidemia previous labs reviewed takes his medicine Patient also states he takes Valium 1 or 2 per day to help him with stress as well as muscle spasms he states it does not cause drowsiness he is taking for years   Review of Systems  Constitutional: Negative for fatigue and fever.  HENT: Negative for congestion.   Respiratory: Negative for cough and shortness of breath.   Cardiovascular: Negative for chest pain.  Gastrointestinal: Positive for diarrhea. Negative for abdominal distention and abdominal pain.  Neurological: Positive for headaches.   Patient states when he takes his blood pressure at home it overall looks pretty good    Objective:   Physical Exam Blood pressure good lungs clear heart regular pulse normal extremities no edema skin warm dry   25 minutes was spent with the patient. Greater than half the time was spent in discussion and answering questions and counseling regarding the issues that the patient came in for today.     Assessment & Plan:  Diarrhea related to recent accidental stoppage of Effexor now that  he is started back he should do better  ADD 3 prescriptions given follow-up in approximate 3 months  Diabetes he needs get his lab work done watch diet stay active  Hyperlipidemia previous labs reviewed current labs ordered await results  Anxiety muscle spasms Valium no more than twice daily caution drowsiness patient states it does not cause drowsiness prescription given  Patient was counseled to compare his medicines at home to his med list in if there is any discrepancies he is to let us know.  Patient not on any narcotics  Depression under good control on next visit will discuss possibly tapering down the arms some of his medications

## 2016-06-21 NOTE — Patient Instructions (Signed)
Please do your lab work soon 

## 2016-06-22 ENCOUNTER — Telehealth: Payer: Self-pay | Admitting: Family Medicine

## 2016-06-22 NOTE — Telephone Encounter (Signed)
Pt checked his medication against the list he was given yesterday and states that he has everything but the metformin. Pt states that if that is something that he should be taking to have it sent it.     [facility]

## 2016-06-22 NOTE — Telephone Encounter (Signed)
Please send in refills on metformin. Remind the patient to do his lab work as requested

## 2016-06-23 MED ORDER — METFORMIN HCL 500 MG PO TABS: ORAL_TABLET | ORAL | 1 refills | Status: DC

## 2016-06-23 NOTE — Telephone Encounter (Signed)
Left message return call (medication sent into pharmacy) 06/23/2016

## 2016-07-01 ENCOUNTER — Other Ambulatory Visit: Payer: Self-pay | Admitting: Nurse Practitioner

## 2016-07-02 LAB — HEMOGLOBIN A1C
Est. average glucose Bld gHb Est-mCnc: 126 mg/dL
Hgb A1c MFr Bld: 6 % — ABNORMAL HIGH (ref 4.8–5.6)

## 2016-07-02 LAB — LIPID PANEL
CHOL/HDL RATIO: 4.6 ratio (ref 0.0–5.0)
CHOLESTEROL TOTAL: 157 mg/dL (ref 100–199)
HDL: 34 mg/dL — AB (ref >39)
LDL Calculated: 94 mg/dL (ref 0–99)
TRIGLYCERIDES: 146 mg/dL (ref 0–149)
VLDL Cholesterol Cal: 29 mg/dL (ref 5–40)

## 2016-07-05 ENCOUNTER — Telehealth: Payer: Self-pay

## 2016-07-05 NOTE — Telephone Encounter (Signed)
Labs are back from [facility] and there are in EG box

## 2016-07-13 NOTE — Progress Notes (Signed)
Order(s) created erroneously. Erroneous order ID: [id number]  Order moved by: Genia Harold D  Order move date/time: 07/13/2016 1:52 PM  Source Patient: [id number]  Source Contact: 07/01/2016  Destination Patient: [id number]  Destination Contact: 01/05/2014

## 2016-07-13 NOTE — Progress Notes (Signed)
Order(s) created erroneously. Erroneous order ID: [id number]  Order moved by: Genia Harold D  Order move date/time: 07/13/2016 1:55 PM  Source Patient: [id number]  Source Contact: 07/01/2016  Destination Patient: [id number]  Destination Contact: 01/05/2014

## 2016-07-13 NOTE — Progress Notes (Signed)
Order(s) created erroneously. Erroneous order ID: [id number]  Order moved by: Genia Harold D  Order move date/time: 07/13/2016 1:54 PM  Source Patient: [id number]  Source Contact: 07/01/2016  Destination Patient: [id number]  Destination Contact: 01/05/2014

## 2016-07-13 NOTE — Progress Notes (Signed)
Order(s) created erroneously. Erroneous order ID: [id number]  Order moved by: Genia Harold D  Order move date/time: 07/13/2016 1:56 PM  Source Patient: [id number]  Source Contact: 07/01/2016  Destination Patient: [id number]  Destination Contact: 01/05/2014  Erroneous order ID: [id number]  Order moved by: Genia Harold D  Order move date/time: 07/13/2016 1:56 PM  Source Patient: [id number]  Source Contact: 07/01/2016  Destination Patient: [id number]  Destination Contact: 01/05/2014

## 2016-07-14 ENCOUNTER — Telehealth: Payer: Self-pay | Admitting: Nurse Practitioner

## 2016-07-14 NOTE — Telephone Encounter (Signed)
Please tell the patient started for the delay in getting his labs back but the lab entered admin incorrectly and they had to be manually done and faxed. His blood cell count looks good, no sign of infection or significant blood loss. His liver labs are all normal. His clotting factors are normal. His tumor marker is normal. Keep plans for routine follow-up for ongoing liver care.  MELD: 7 Child-Pugh: A

## 2016-07-20 NOTE — Telephone Encounter (Signed)
Patient made aware of results and recommendations 

## 2016-07-20 NOTE — Telephone Encounter (Signed)
I tried to call the patient, no answer,lmom 

## 2016-08-23 ENCOUNTER — Encounter: Payer: Self-pay | Admitting: Nurse Practitioner

## 2016-09-03 ENCOUNTER — Other Ambulatory Visit: Payer: Self-pay | Admitting: Family Medicine

## 2016-09-18 ENCOUNTER — Other Ambulatory Visit: Payer: Self-pay | Admitting: Family Medicine

## 2016-09-22 ENCOUNTER — Telehealth: Payer: Self-pay | Admitting: Family Medicine

## 2016-09-22 ENCOUNTER — Other Ambulatory Visit: Payer: Self-pay | Admitting: Family Medicine

## 2016-09-22 MED ORDER — VENLAFAXINE HCL ER 150 MG PO CP24: 150.0000 mg | ORAL_CAPSULE | Freq: Two times a day (BID) | ORAL | 0 refills | Status: DC

## 2016-09-22 NOTE — Telephone Encounter (Signed)
Patient is requesting refill on venlafaxine XR 150 mg he is completely out,just needs 30 day supply until his mail order prescription comes in . [phone]

## 2016-09-22 NOTE — Telephone Encounter (Signed)
Prescription sent electronically to pharmacy. Patient notified. 

## 2016-09-26 ENCOUNTER — Ambulatory Visit: Payer: BLUE CROSS/BLUE SHIELD | Admitting: Nurse Practitioner

## 2016-10-03 ENCOUNTER — Ambulatory Visit (INDEPENDENT_AMBULATORY_CARE_PROVIDER_SITE_OTHER): Payer: BLUE CROSS/BLUE SHIELD | Admitting: Family Medicine

## 2016-10-03 ENCOUNTER — Encounter: Payer: Self-pay | Admitting: Family Medicine

## 2016-10-03 VITALS — BP 128/84 | Temp 98.8°F | Ht 65.0 in | Wt 221.0 lb

## 2016-10-03 DIAGNOSIS — S80862A Insect bite (nonvenomous), left lower leg, initial encounter: Secondary | ICD-10-CM

## 2016-10-03 DIAGNOSIS — S80861A Insect bite (nonvenomous), right lower leg, initial encounter: Secondary | ICD-10-CM

## 2016-10-03 DIAGNOSIS — W57XXXA Bitten or stung by nonvenomous insect and other nonvenomous arthropods, initial encounter: Secondary | ICD-10-CM

## 2016-10-03 MED ORDER — METHYLPREDNISOLONE ACETATE 40 MG/ML IJ SUSP
40.0000 mg | Freq: Once | INTRAMUSCULAR | Status: AC
  Administered 2016-10-03: 40 mg via INTRAMUSCULAR

## 2016-10-03 MED ORDER — HYDROXYZINE HCL 25 MG PO TABS: 25.0000 mg | ORAL_TABLET | Freq: Three times a day (TID) | ORAL | 0 refills | Status: DC | PRN

## 2016-10-03 MED ORDER — PREDNISONE 20 MG PO TABS: ORAL_TABLET | ORAL | 0 refills | Status: DC

## 2016-10-03 NOTE — Progress Notes (Signed)
   Subjective:    Patient ID: Shawn [name]., male    DOB: 04-Apr-1955, 61 y.o.   MRN: [id number]  Rash  This is a new problem. Episode onset: 3 days. Location: both legs. Associated with: posion oak. (Itching) Treatments tried: calamine lotion.   When out walking in a field with his son came back later that day allotted itching he thought he had poison ivy   Review of Systems  Skin: Positive for rash.       Objective:   Physical Exam On examination multiple small spots on the legs consistent with straw mites no sign of any other particular problems no poison ivy       Assessment & Plan:  Straw mites Prednisone taper Depo-Medrol shot Atarax as needed for itching cautioned drowsiness Follow-up for standard wellness checkup

## 2016-10-10 IMAGING — US US ABDOMEN LIMITED
1 series · 14 of 25 positions shown · non-contrast
Comparison: [DATE]

CLINICAL DATA: Cirrhosis.  Hepatoma screening.

EXAM:
US ABDOMEN LIMITED - RIGHT UPPER QUADRANT

[Series 1: us abdomen limited · 0.22mm/px · 14 of 69 slices shown]
[im 1/69]
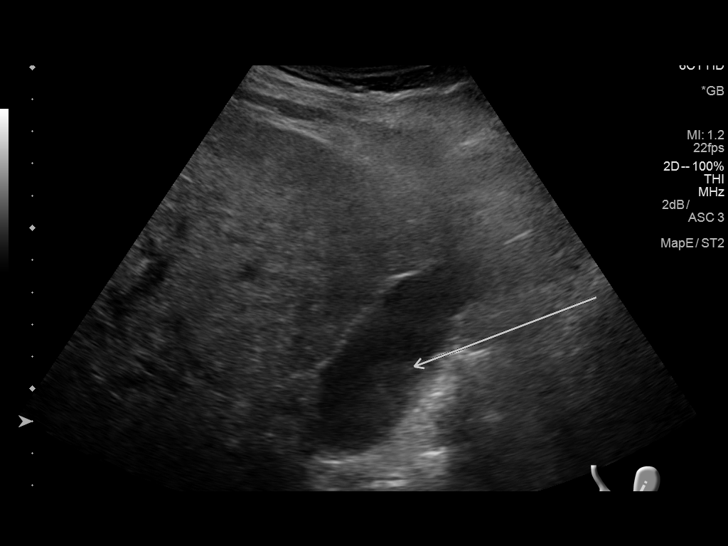
[im 6/69]
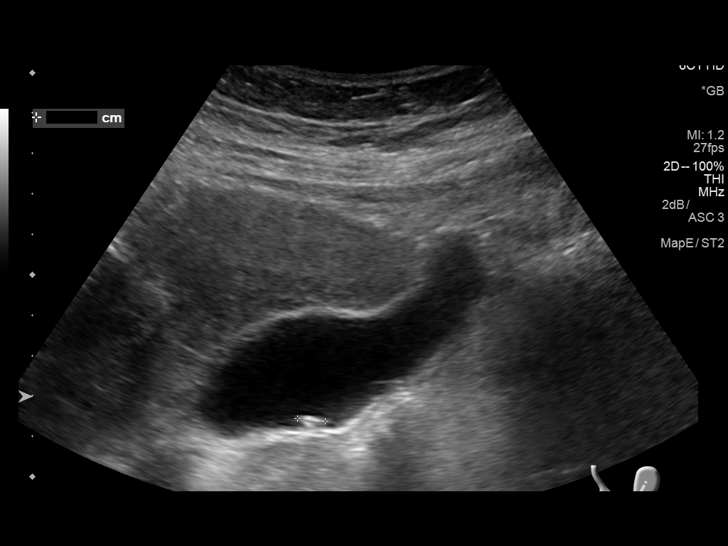
[im 12/69]
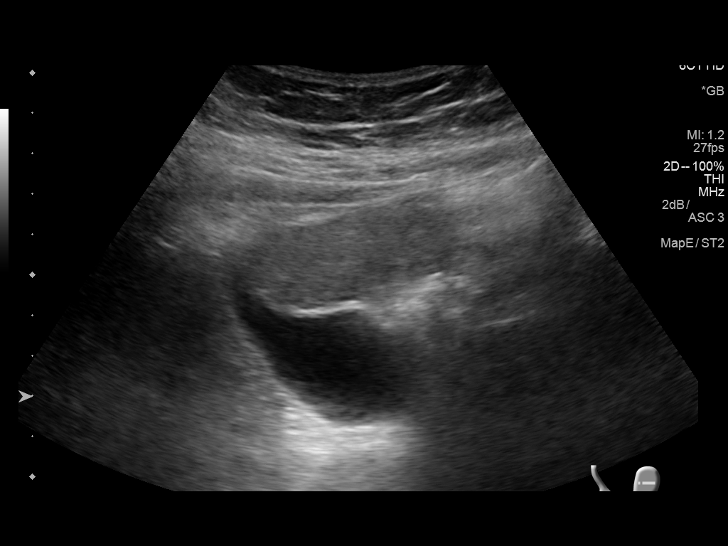
[im 18/69]
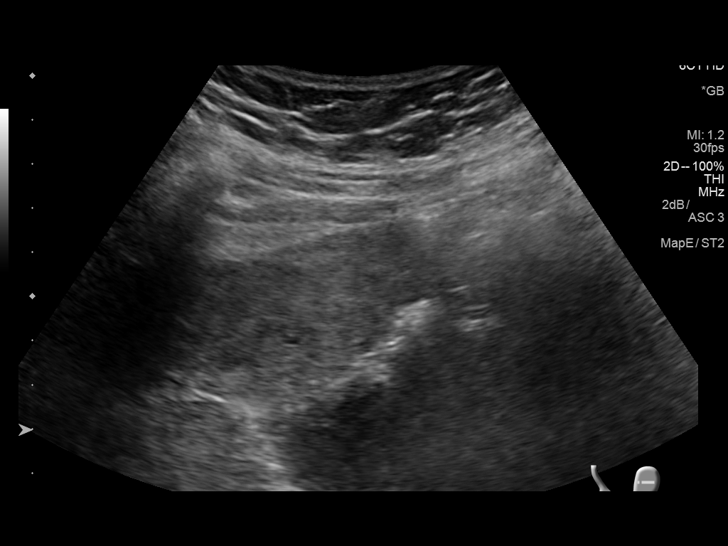
[im 23/69]
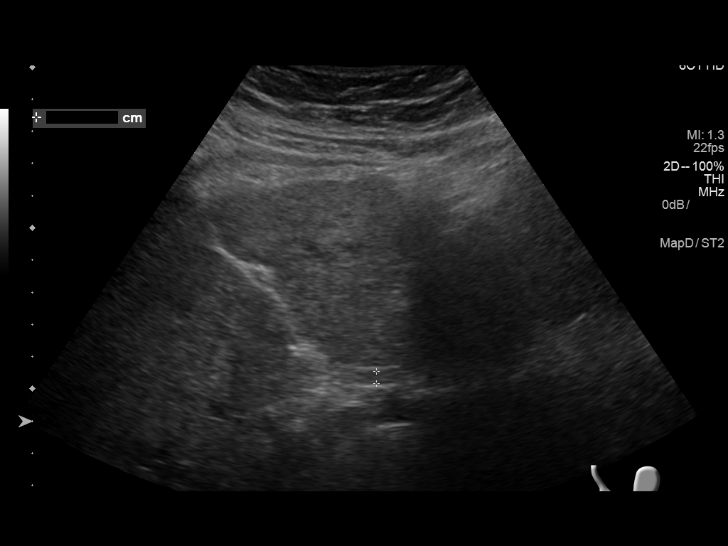
[im 26/69]
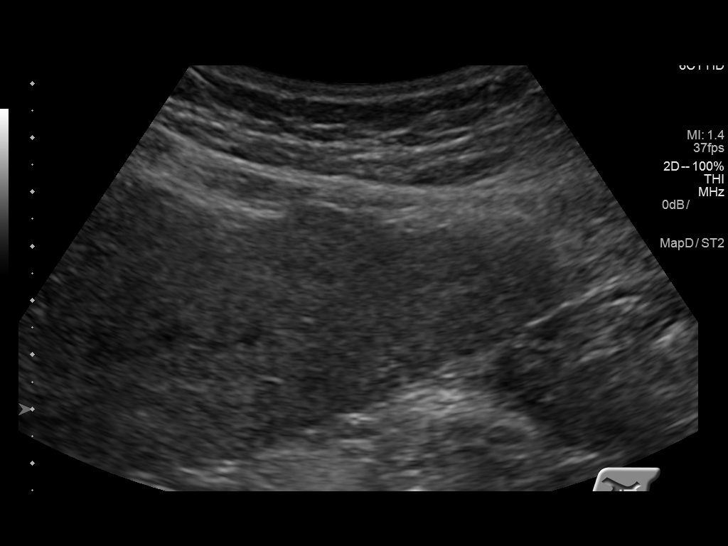
[im 32/69]
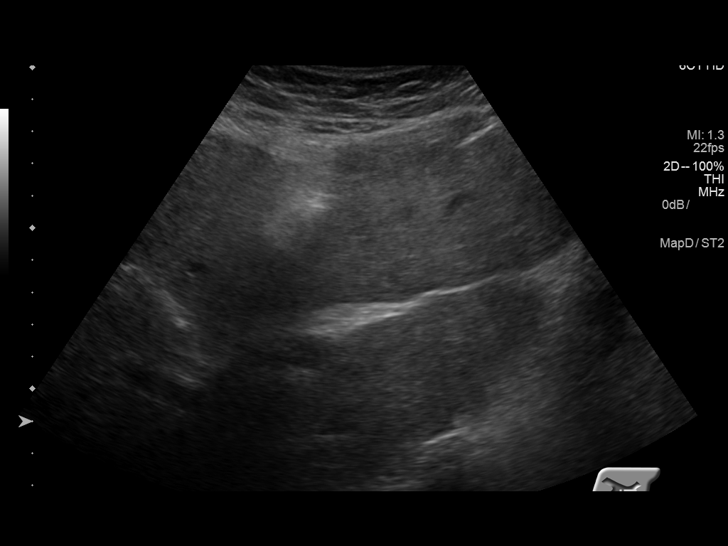
[im 37/69]
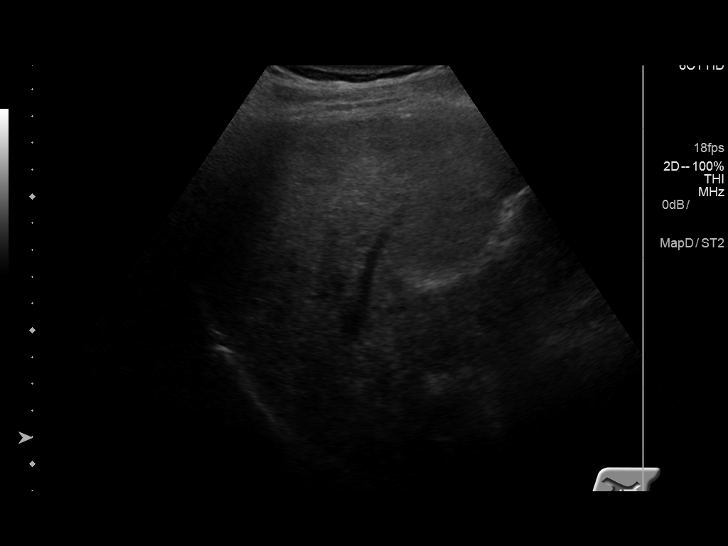
[im 43/69]
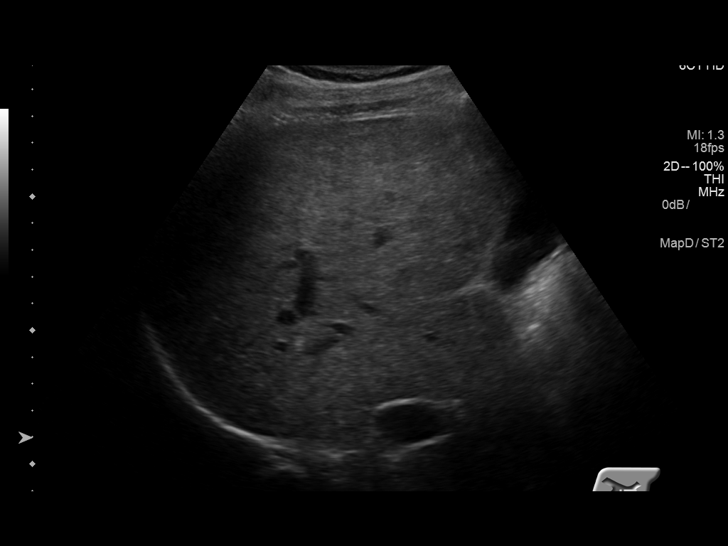
[im 46/69]
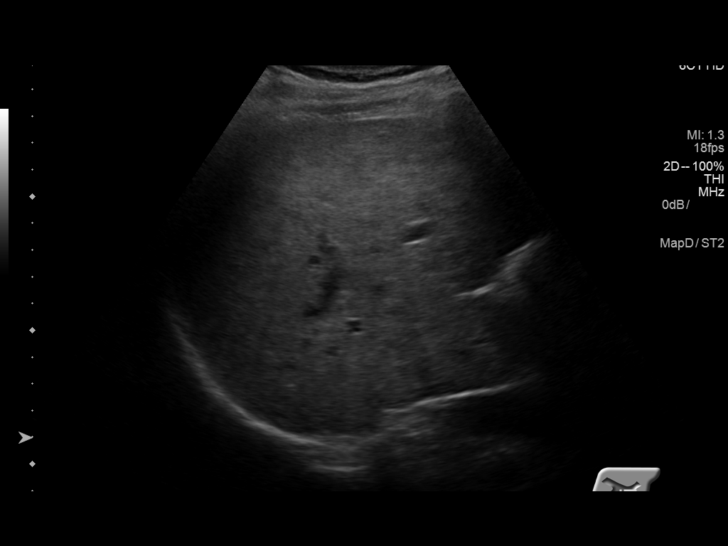
[im 52/69]
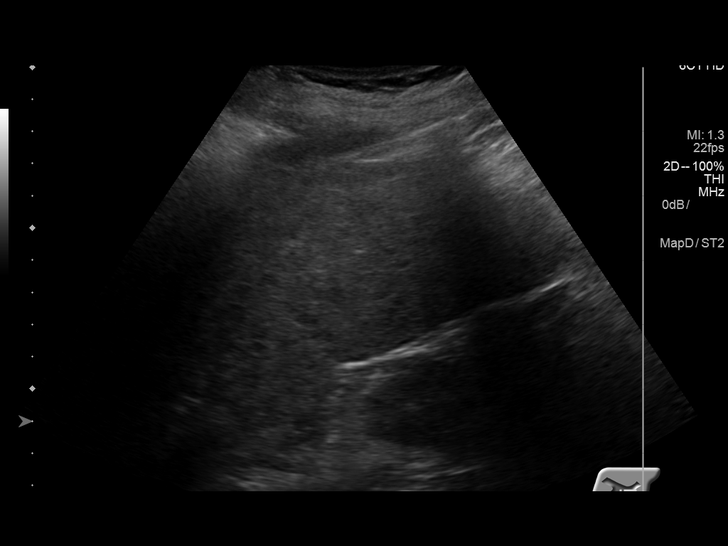
[im 57/69]
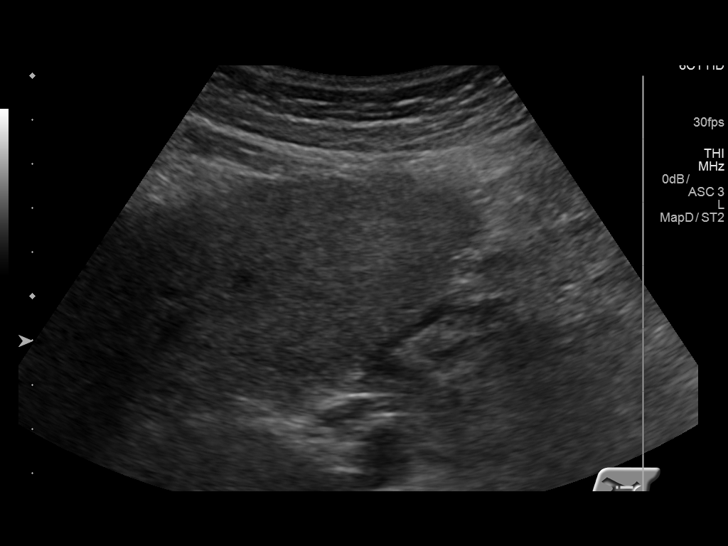
[im 63/69]
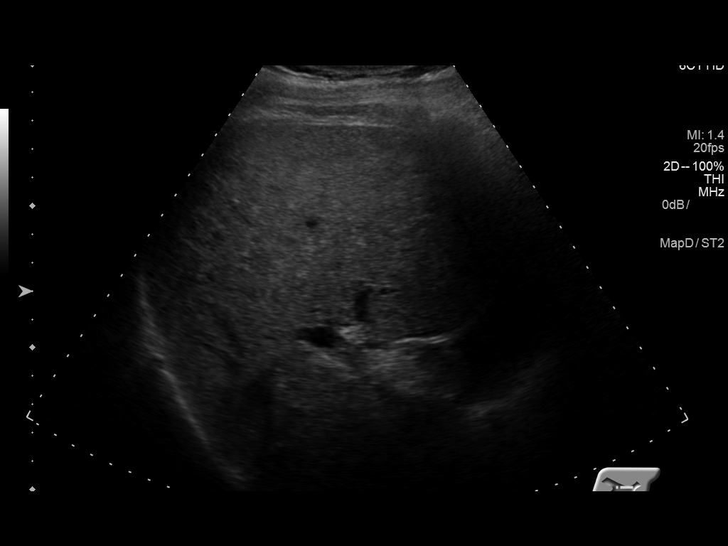
[im 69/69]
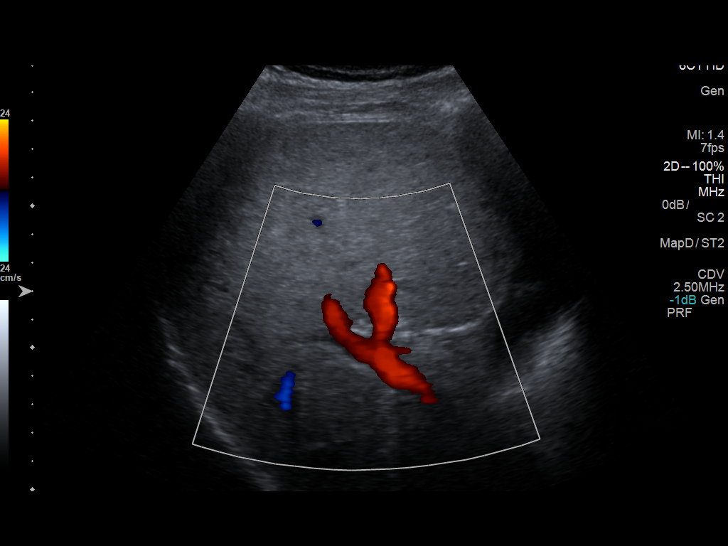

[14 of 25 positions shown; findings below may reference images not displayed]

FINDINGS: Gallbladder:

7 mm echogenic structure that is non shadowing and non mobile per
sonographer exam, but was not seen previously - timing favoring
calculus. There is also gallbladder sludge. No wall thickening or
focal tenderness.

Common bile duct:

Diameter: 4 mm

Liver:

Cirrhotic liver with lobulated morphology. No mass lesion is seen.
Antegrade flow in the main portal vein.
IMPRESSION: 1. Cirrhosis.  No evidence of mass.
2. Gallbladder sludge. Newly seen 7 mm echogenic structure favoring
stone over polyp.

## 2016-10-15 ENCOUNTER — Other Ambulatory Visit: Payer: Self-pay | Admitting: Family Medicine

## 2016-10-17 ENCOUNTER — Ambulatory Visit: Payer: BLUE CROSS/BLUE SHIELD | Admitting: Nurse Practitioner

## 2016-10-18 NOTE — Telephone Encounter (Signed)
Last seen 06/21/16

## 2016-10-27 ENCOUNTER — Telehealth: Payer: Self-pay | Admitting: Family Medicine

## 2016-10-27 MED ORDER — DIAZEPAM 10 MG PO TABS: ORAL_TABLET | ORAL | 2 refills | Status: DC

## 2016-10-27 NOTE — Telephone Encounter (Signed)
Spoke with patient and informed him per Dr.Scott Luking -we are sending in refills on diazepam. Patient verbalized understanding.

## 2016-10-27 NOTE — Telephone Encounter (Signed)
Pt requesting refill on his diazepam (VALIUM) 10 MG tablet  Please advise & call when done   [facility]

## 2016-10-27 NOTE — Telephone Encounter (Signed)
May have 30 day with 2 refills

## 2016-10-27 NOTE — Telephone Encounter (Signed)
Left message return call 10/27/16 (prescription printed)

## 2016-12-07 ENCOUNTER — Other Ambulatory Visit: Payer: Self-pay

## 2016-12-07 ENCOUNTER — Encounter: Payer: Self-pay | Admitting: Nurse Practitioner

## 2016-12-07 ENCOUNTER — Ambulatory Visit (INDEPENDENT_AMBULATORY_CARE_PROVIDER_SITE_OTHER): Payer: BLUE CROSS/BLUE SHIELD | Admitting: Nurse Practitioner

## 2016-12-07 VITALS — BP 136/89 | HR 63 | Temp 99.0°F | Ht 65.0 in | Wt 218.4 lb

## 2016-12-07 DIAGNOSIS — K7460 Unspecified cirrhosis of liver: Secondary | ICD-10-CM

## 2016-12-07 NOTE — Assessment & Plan Note (Signed)
The patient has a history of likely nonalcoholic cirrhosis. He has a history of A disease. At this point he is due for routine labs and imaging. I'll check a CBC, CMP, PTT/INR, AFP. I will also check a right upper quadrant ultrasound for hepatoma screening. Return for follow-up in 6 months.

## 2016-12-07 NOTE — Progress Notes (Signed)
  Referring Provider: Luking, Scott A, MD Primary Care Physician:  Luking, Scott A, MD Primary GI:  Dr. Rourk  Chief Complaint  Patient presents with  . Follow-up    doing well    HPI:   Shawn L Schwandt Jr. is a 61 y.o. male who presents for follow-up and routine cirrhosis care. Likely due to fatty liver. Has had historically well compensated disease. Has seen a nutritionist. At his last visit he was doing well, denied any GI or hepatic symptoms. Recommended routine labs and imaging.  Labs and imaging reviewed. CBC was normal other than mild platelet suppression at 149. CMP essentially normal. INR 1.1. AFP normal. MELD score: 7, Child Pugh: A. right upper quadrant ultrasound found consistent with cirrhosis without evidence of mass. Gallbladder sludge with a newly seen 7 mm echogenic structure favoring stone of her polyp. Generally asymptomatic.  Today he states he's doing well overall. Denies abdominal pain, N/V, hematochezia, melena, fever, chills, unintentional weight loss, yellowing of skin/eyes, darkened urine, acute episodic confusion. States he bruises easier, no excessive bleeding. Denies tremors. Denies chest pain, dyspnea, dizziness, lightheadedness, syncope, near syncope. Denies any other upper or lower GI symptoms.  Past Medical History:  Diagnosis Date  . ADHD   . Anxiety   . Attention deficit disorder   . Cancer (HCC)   . Cirrhosis of liver (HCC)   . CVA (cerebral vascular accident) (HCC)   . Depression   . Diabetic neuropathy (HCC)   . Essential hypertension, benign    Long-standing history, negative secondary workup  . Headache   . History of cardiac catheterization    Widely patent coronary and renal arteries November 2013  . Hyperlipidemia   . Obstructive sleep apnea    cpap  . TIA (transient ischemic attack)    Possible, 2007  . Type 2 diabetes mellitus (HCC)     Past Surgical History:  Procedure Laterality Date  . ANTERIOR CERVICAL DECOMP/DISCECTOMY  FUSION N/A 12/30/2015   Procedure: ANTERIOR CERVICAL DECOMPRESSION/DISCECTOMY FUSION C6 - C7 1 LEVEL;  Surgeon: Dahari Brooks, MD;  Location: [facility] OR;  Service: Orthopedics;  Laterality: N/A;  . CARPAL TUNNEL RELEASE    . KNEE SURGERY Right   . NECK SURGERY    . VASECTOMY      Current Outpatient Prescriptions  Medication Sig Dispense Refill  . amLODipine (NORVASC) 10 MG tablet TAKE 1 TABLET DAILY 90 tablet 0  . amphetamine-dextroamphetamine (ADDERALL XR) 10 MG 24 hr capsule Take 1 capsule (10 mg total) by mouth daily. (Patient taking differently: Take 10 mg by mouth daily. Takes as needed  ( 2-3 times a week)) 30 capsule 0  . buPROPion (WELLBUTRIN XL) 300 MG 24 hr tablet TAKE 1 TABLET EVERY DAY 90 tablet 1  . diazepam (VALIUM) 10 MG tablet One 2 times a day as needed caution drowsiness 60 tablet 2  . diclofenac (VOLTAREN) 75 MG EC tablet Take 75 mg by mouth 2 (two) times daily.    . gabapentin (NEURONTIN) 100 MG capsule Take 1 capsule (100 mg total) by mouth 3 (three) times daily. 270 capsule 1  . glucose blood test strip Uset to test blood sugar once daily Dx- E11.9 100 each 1  . hydrochlorothiazide (HYDRODIURIL) 12.5 MG tablet Take 1 tablet (12.5 mg total) by mouth daily. 90 tablet 3  . hydrOXYzine (ATARAX/VISTARIL) 25 MG tablet Take 1 tablet (25 mg total) by mouth 3 (three) times daily as needed. 30 tablet 0  . KLOR-CON M20 20 MEQ   tablet TAKE 2 TABLETS EACH MORNING 180 tablet 0  . lisinopril (PRINIVIL,ZESTRIL) 40 MG tablet Take 1 tablet (40 mg total) by mouth daily. 90 tablet 1  . metFORMIN (GLUCOPHAGE) 500 MG tablet Take 1 tablet po in am and 2 tablets po in the evening 270 tablet 1  . metoprolol succinate (TOPROL-XL) 50 MG 24 hr tablet TAKE 1 TABLET DAILY; TAKE  WITH OR IMMEDIATELY        FOLLOWING A MEAL 90 tablet 0  . ONETOUCH DELICA LANCETS 33G MISC Use to test blood sugar once daily Dx- E11.9 100 each 1  . pantoprazole (PROTONIX) 40 MG tablet TAKE 1 TABLET DAILY 90 tablet 0  .  pravastatin (PRAVACHOL) 80 MG tablet TAKE 1 TABLET DAILY 90 tablet 0  . venlafaxine XR (EFFEXOR-XR) 150 MG 24 hr capsule TAKE 1 CAPSULE TWICE DAILY 180 capsule 0  . blood glucose meter kit and supplies KIT Dispense based on patient and insurance preference. Use daily as directed. (FOR ICD - 10 E11.9) 1 each 5   No current facility-administered medications for this visit.     Allergies as of 12/07/2016  . (No Known Allergies)    Family History  Problem Relation Age of Onset  . Colon cancer Mother   . Hypertension Mother   . CAD Mother   . Breast cancer Mother   . Cirrhosis Brother     Social History   Social History  . Marital status: Married    Spouse name: N/A  . Number of children: 4  . Years of education: N/A   Occupational History  . maintainer     Social History Main Topics  . Smoking status: Never Smoker  . Smokeless tobacco: Current User    Types: Snuff     Comment: Uses dip pouches occasionally  . Alcohol use No     Comment: Previously 1-2 cases of beer/week.  . Drug use: No  . Sexual activity: Not Asked   Other Topics Concern  . None   Social History Narrative  . None    Review of Systems: General: Negative for anorexia, weight loss, fever, chills, fatigue, weakness. ENT: Negative for hoarseness, difficulty swallowing. CV: Negative for chest pain, angina, palpitations, peripheral edema.  Respiratory: Negative for dyspnea at rest, cough, sputum, wheezing.  GI: See history of present illness. Derm: Negative for rash or itching.  Neuro: Negative for memory loss, confusion.  Endo: Negative for unusual weight change.  Heme: Negative for bruising or bleeding. Allergy: Negative for rash or hives.   Physical Exam: BP 136/89   Pulse 63   Temp 99 F (37.2 C) (Oral)   Ht 5' 5" (1.651 m)   Wt 218 lb 6.4 oz (99.1 kg)   BMI 36.34 kg/m  General:   Obese male. Alert and oriented. Pleasant and cooperative. Well-nourished and well-developed.  Eyes:   Without icterus, sclera clear and conjunctiva pink.  Ears:  Normal auditory acuity. Cardiovascular:  S1, S2 present without murmurs appreciated. Extremities without clubbing. Trace to 1+ bilateral LE edema noted. Respiratory:  Clear to auscultation bilaterally. No wheezes, rales, or rhonchi. No distress.  Gastrointestinal:  +BS, soft, non-tender and non-distended. No HSM noted. No guarding or rebound. No masses appreciated.  Rectal:  Deferred  Musculoskalatal:  Symmetrical without gross deformities. Skin:  Intact without significant lesions or rashes. Neurologic:  Alert and oriented x4;  grossly normal neurologically. Psych:  Alert and cooperative. Normal mood and affect. Heme/Lymph/Immune: No excessive bruising noted.    12/07/2016   9:49 AM   Disclaimer: This note was dictated with voice recognition software. Similar sounding words can inadvertently be transcribed and may not be corrected upon review.  

## 2016-12-07 NOTE — Patient Instructions (Signed)
1. Have your labs drawn when you're able to. 2. He will help you schedule your ultrasound of your liver. 3. Return for follow-up in 6 months. 4. Call us if you have any questions or concerns.

## 2016-12-07 NOTE — Progress Notes (Signed)
cc'ed to pcp °

## 2016-12-13 ENCOUNTER — Other Ambulatory Visit: Payer: Self-pay | Admitting: Family Medicine

## 2016-12-13 ENCOUNTER — Ambulatory Visit (HOSPITAL_COMMUNITY): Admission: RE | Admit: 2016-12-13 | Payer: BLUE CROSS/BLUE SHIELD | Source: Ambulatory Visit

## 2016-12-14 ENCOUNTER — Other Ambulatory Visit: Payer: Self-pay | Admitting: Family Medicine

## 2016-12-21 ENCOUNTER — Other Ambulatory Visit: Payer: Self-pay | Admitting: Family Medicine

## 2017-01-03 ENCOUNTER — Other Ambulatory Visit: Payer: Self-pay | Admitting: *Deleted

## 2017-01-03 MED ORDER — METOPROLOL SUCCINATE ER 50 MG PO TB24: ORAL_TABLET | ORAL | 0 refills | Status: DC

## 2017-01-03 MED ORDER — VENLAFAXINE HCL ER 150 MG PO CP24: 150.0000 mg | ORAL_CAPSULE | Freq: Two times a day (BID) | ORAL | 1 refills | Status: DC

## 2017-01-03 MED ORDER — AMLODIPINE BESYLATE 10 MG PO TABS: 10.0000 mg | ORAL_TABLET | Freq: Every day | ORAL | 0 refills | Status: DC

## 2017-01-03 MED ORDER — PANTOPRAZOLE SODIUM 40 MG PO TBEC: 40.0000 mg | DELAYED_RELEASE_TABLET | Freq: Every day | ORAL | 0 refills | Status: DC

## 2017-01-03 MED ORDER — LISINOPRIL 40 MG PO TABS: 40.0000 mg | ORAL_TABLET | Freq: Every day | ORAL | 0 refills | Status: DC

## 2017-01-03 MED ORDER — PRAVASTATIN SODIUM 80 MG PO TABS: 80.0000 mg | ORAL_TABLET | Freq: Every day | ORAL | 0 refills | Status: DC

## 2017-01-09 ENCOUNTER — Telehealth: Payer: Self-pay

## 2017-01-16 ENCOUNTER — Telehealth: Payer: Self-pay

## 2017-01-16 MED ORDER — BUPROPION HCL ER (XL) 300 MG PO TB24: 300.0000 mg | ORAL_TABLET | Freq: Every day | ORAL | 1 refills | Status: DC

## 2017-01-16 NOTE — Telephone Encounter (Signed)
May have one 90-day refill, needs office visit within 90 days

## 2017-01-16 NOTE — Telephone Encounter (Signed)
Patient pharmacy [facility] [address] is requesting Bupropion HCL XL 300 one daily. Please advise.fax # [phone]

## 2017-01-16 NOTE — Telephone Encounter (Signed)
Refills sent in to pharmacy 

## 2017-03-09 ENCOUNTER — Other Ambulatory Visit: Payer: Self-pay | Admitting: *Deleted

## 2017-03-09 MED ORDER — POTASSIUM CHLORIDE CRYS ER 20 MEQ PO TBCR: EXTENDED_RELEASE_TABLET | ORAL | 0 refills | Status: DC

## 2017-03-27 ENCOUNTER — Other Ambulatory Visit: Payer: Self-pay | Admitting: Family Medicine

## 2017-05-17 HISTORY — PX: OTHER SURGICAL HISTORY: SHX169

## 2017-05-19 ENCOUNTER — Telehealth: Payer: Self-pay | Admitting: Family Medicine

## 2017-05-19 NOTE — Telephone Encounter (Signed)
Patient is requesting refill on diazepam 10 mg tabs to [facility].  Also, requesting orders for labs.  He plans on having these done and then he will schedule his physical.

## 2017-05-22 ENCOUNTER — Other Ambulatory Visit: Payer: Self-pay | Admitting: *Deleted

## 2017-05-22 DIAGNOSIS — E785 Hyperlipidemia, unspecified: Secondary | ICD-10-CM

## 2017-05-22 DIAGNOSIS — I10 Essential (primary) hypertension: Secondary | ICD-10-CM

## 2017-05-22 DIAGNOSIS — Z79899 Other long term (current) drug therapy: Secondary | ICD-10-CM

## 2017-05-22 DIAGNOSIS — K7460 Unspecified cirrhosis of liver: Secondary | ICD-10-CM

## 2017-05-22 DIAGNOSIS — E119 Type 2 diabetes mellitus without complications: Secondary | ICD-10-CM

## 2017-05-22 DIAGNOSIS — Z125 Encounter for screening for malignant neoplasm of prostate: Secondary | ICD-10-CM

## 2017-05-22 MED ORDER — DIAZEPAM 10 MG PO TABS: ORAL_TABLET | ORAL | 1 refills | Status: DC

## 2017-05-22 NOTE — Telephone Encounter (Signed)
Patient may have 2 refills of his diazepam.  Also patient needs to do all of his lab work at one time this will include both what we need and what gastroenterology needs.  Please do the following lab work A1c, lipid, comprehensive metabolic profile, CBC, pro time and INR, AFP tumor marker, PSA-prostate cancer screening, hypertension, diabetes, hyperlipidemia, cirrhosis- schedule follow-up checkup later in April

## 2017-05-22 NOTE — Telephone Encounter (Signed)
bloodwork orders put in and rx printed. Await dr scott's signature then can fax and discuss with pt.

## 2017-05-22 NOTE — Telephone Encounter (Signed)
rx faxed to pharm. bw orders put in. Left message with pt to return call to discuss with pt.

## 2017-05-22 NOTE — Telephone Encounter (Signed)
Discussed with pt. Pt verbalized understanding. Pt states he will do bloodwork and he will call back to schedule office visit in April.

## 2017-05-30 LAB — CBC WITH DIFFERENTIAL/PLATELET
BASOS: 1 %
Basophils Absolute: 0 10*3/uL (ref 0.0–0.2)
EOS (ABSOLUTE): 0.3 10*3/uL (ref 0.0–0.4)
EOS: 4 %
HEMATOCRIT: 50.6 % (ref 37.5–51.0)
HEMOGLOBIN: 17.3 g/dL (ref 13.0–17.7)
Immature Grans (Abs): 0 10*3/uL (ref 0.0–0.1)
Immature Granulocytes: 0 %
LYMPHS ABS: 1.9 10*3/uL (ref 0.7–3.1)
Lymphs: 30 %
MCH: 31.6 pg (ref 26.6–33.0)
MCHC: 34.2 g/dL (ref 31.5–35.7)
MCV: 93 fL (ref 79–97)
Monocytes Absolute: 0.6 10*3/uL (ref 0.1–0.9)
Monocytes: 10 %
NEUTROS ABS: 3.5 10*3/uL (ref 1.4–7.0)
Neutrophils: 55 %
Platelets: 133 10*3/uL — ABNORMAL LOW (ref 150–379)
RBC: 5.47 x10E6/uL (ref 4.14–5.80)
RDW: 15 % (ref 12.3–15.4)
WBC: 6.3 10*3/uL (ref 3.4–10.8)

## 2017-05-30 LAB — COMPREHENSIVE METABOLIC PANEL
ALBUMIN: 4.3 g/dL (ref 3.6–4.8)
ALK PHOS: 106 IU/L (ref 39–117)
ALT: 56 IU/L — ABNORMAL HIGH (ref 0–44)
AST: 38 IU/L (ref 0–40)
Albumin/Globulin Ratio: 1.7 (ref 1.2–2.2)
BUN / CREAT RATIO: 11 (ref 10–24)
BUN: 10 mg/dL (ref 8–27)
Bilirubin Total: 0.8 mg/dL (ref 0.0–1.2)
CALCIUM: 9.6 mg/dL (ref 8.6–10.2)
CO2: 31 mmol/L — AB (ref 20–29)
CREATININE: 0.94 mg/dL (ref 0.76–1.27)
Chloride: 100 mmol/L (ref 96–106)
GFR, EST AFRICAN AMERICAN: 101 mL/min/{1.73_m2} (ref >59)
GFR, EST NON AFRICAN AMERICAN: 88 mL/min/{1.73_m2} (ref >59)
GLOBULIN, TOTAL: 2.6 g/dL (ref 1.5–4.5)
Glucose: 192 mg/dL — ABNORMAL HIGH (ref 65–99)
Potassium: 3.6 mmol/L (ref 3.5–5.2)
SODIUM: 143 mmol/L (ref 134–144)
TOTAL PROTEIN: 6.9 g/dL (ref 6.0–8.5)

## 2017-05-30 LAB — PROTIME-INR
INR: 1.2 (ref 0.8–1.2)
Prothrombin Time: 12 s (ref 9.1–12.0)

## 2017-05-30 LAB — LIPID PANEL
CHOL/HDL RATIO: 5.2 ratio — AB (ref 0.0–5.0)
Cholesterol, Total: 173 mg/dL (ref 100–199)
HDL: 33 mg/dL — ABNORMAL LOW (ref >39)
LDL Calculated: 93 mg/dL (ref 0–99)
TRIGLYCERIDES: 236 mg/dL — AB (ref 0–149)
VLDL CHOLESTEROL CAL: 47 mg/dL — AB (ref 5–40)

## 2017-05-30 LAB — HEMOGLOBIN A1C
Est. average glucose Bld gHb Est-mCnc: 151 mg/dL
Hgb A1c MFr Bld: 6.9 % — ABNORMAL HIGH (ref 4.8–5.6)

## 2017-05-30 LAB — PSA: Prostate Specific Ag, Serum: 0.5 ng/mL (ref 0.0–4.0)

## 2017-05-30 LAB — AFP TUMOR MARKER: AFP, Serum, Tumor Marker: 6.4 ng/mL (ref 0.0–8.3)

## 2017-06-06 LAB — HM DIABETES EYE EXAM

## 2017-06-06 NOTE — Progress Notes (Signed)
Referring Provider: [name], MD Primary Care Physician:  [name], MD Primary GI:  Dr. Gala Romney  Chief Complaint  Patient presents with  . Cirrhosis    f/u. Doing okay    HPI:   Shawn [name]. is a 62 y.o. male who presents for follow-up on cirrhosis.  The patient was last seen in our office 12/07/2016 for the same.  Cirrhosis likely due to fatty liver, historically well compensated disease.  At his last visit he was doing well overall.  No GI or hepatic symptoms.  Recommended routine labs, right upper quadrant ultrasound, follow-up in 6 months.  Labs completed 05/29/2017 with mildly elevated INR 1.2, normal AFP, significantly elevated glucose on CMP but normal kidney and liver function although ALT was mildly elevated at 56.  Chronic depressed platelet count at 133.  Right upper quadrant ultrasound on cirrhosis but no evidence of mass.  Gallbladder sludge, newly seen 7 mm echogenic structure favoring a stone over polyp.  No signs of acute cholecystitis.  Today states doing well overall. Denies abdominal pain, N/V, hematochezia, melena. Denies yellowing of skin/eyes, darkened urine, acute episodic confusion, tremors. Has chronic dyspnea no worse than baseline. Denies chest pain, dizziness, lightheadedness, syncope, near syncope. Denies any other upper or lower GI symptoms.  Past Medical History:  Diagnosis Date  . ADHD   . Anxiety   . Attention deficit disorder   . Cancer ([place])   . Cirrhosis of liver ([place])   . CVA (cerebral vascular accident) ([place])   . Depression   . Diabetic neuropathy ([place])   . Essential hypertension, benign    Long-standing history, negative secondary workup  . Headache   . History of cardiac catheterization    Widely patent coronary and renal arteries November 2013  . Hyperlipidemia   . Obstructive sleep apnea    cpap  . TIA (transient ischemic attack)    Possible, 2007  . Type 2 diabetes mellitus ([place])     Past Surgical History:    Procedure Laterality Date  . ANTERIOR CERVICAL DECOMP/DISCECTOMY FUSION N/A 12/30/2015   Procedure: ANTERIOR CERVICAL DECOMPRESSION/DISCECTOMY FUSION C6 - C7 1 LEVEL;  Surgeon: Melina Schools, MD;  Location: [facility];  Service: Orthopedics;  Laterality: N/A;  . CARPAL TUNNEL RELEASE    . CERVICAL SPINE NERVE BLOCK  05/17/2017  . KNEE SURGERY Right   . NECK SURGERY    . VASECTOMY      Current Outpatient Medications  Medication Sig Dispense Refill  . amLODipine (NORVASC) 10 MG tablet TAKE 1 TABLET DAILY 90 tablet 0  . amphetamine-dextroamphetamine (ADDERALL XR) 10 MG 24 hr capsule Take 1 capsule (10 mg total) by mouth daily. 30 capsule 0  . aspirin EC 81 MG tablet Take 81 mg by mouth daily.    . blood glucose meter kit and supplies KIT Dispense based on patient and insurance preference. Use daily as directed. (FOR ICD - 10 E11.9) 1 each 5  . buPROPion (WELLBUTRIN XL) 300 MG 24 hr tablet Take 1 tablet (300 mg total) daily by mouth. 90 tablet 1  . diazepam (VALIUM) 10 MG tablet One 2 times a day as needed caution drowsiness (Patient taking differently: Take 10 mg by mouth 3 (three) times daily. ) 60 tablet 1  . gabapentin (NEURONTIN) 100 MG capsule Take 1 capsule (100 mg total) by mouth 3 (three) times daily. 270 capsule 1  . glucose blood test strip Uset to test blood sugar once daily Dx- E11.9  100 each 1  . hydrochlorothiazide (HYDRODIURIL) 12.5 MG tablet Take 1 tablet (12.5 mg total) by mouth daily. 90 tablet 3  . lisinopril (PRINIVIL,ZESTRIL) 40 MG tablet TAKE 1 TABLET DAILY; PLEASEMAKE AN APPOINTMENT WITH   YOUR DOCTOR 90 tablet 1  . metFORMIN (GLUCOPHAGE) 500 MG tablet TAKE 1 TABLET BY MOUTH EVERY MORNING AND 2 TABLETS EVERY EVENING 270 tablet 1  . metoprolol succinate (TOPROL-XL) 50 MG 24 hr tablet TAKE 1 TABLET DAILY; TAKE  WITH OR IMMEDIATELY        FOLLOWING A MEAL 90 tablet 1  . ONETOUCH DELICA LANCETS 60F MISC Use to test blood sugar once daily Dx- E11.9 100 each 1  . pantoprazole  (PROTONIX) 40 MG tablet TAKE 1 TABLET DAILY 90 tablet 1  . potassium chloride SA (KLOR-CON M20) 20 MEQ tablet TAKE 2 TABLETS EACH MORNING 180 tablet 0  . pravastatin (PRAVACHOL) 80 MG tablet TAKE 1 TABLET DAILY 90 tablet 1  . venlafaxine XR (EFFEXOR-XR) 150 MG 24 hr capsule Take 1 capsule (150 mg total) by mouth 2 (two) times daily. 180 capsule 1   No current facility-administered medications for this visit.     Allergies as of 06/07/2017  . (No Known Allergies)    Family History  Problem Relation Age of Onset  . Colon cancer Mother   . Hypertension Mother   . CAD Mother   . Breast cancer Mother   . Cirrhosis Brother     Social History   Socioeconomic History  . Marital status: Married    Spouse name: Not on file  . Number of children: 4  . Years of education: Not on file  . Highest education level: Not on file  Occupational History  . Occupation: Barrister's clerk   Social Needs  . Financial resource strain: Not on file  . Food insecurity:    Worry: Not on file    Inability: Not on file  . Transportation needs:    Medical: Not on file    Non-medical: Not on file  Tobacco Use  . Smoking status: Never Smoker  . Smokeless tobacco: Current User    Types: Snuff  . Tobacco comment: Uses dip pouches occasionally  Substance and Sexual Activity  . Alcohol use: No    Alcohol/week: 0.0 oz    Comment: Previously 1-2 cases of beer/week.  . Drug use: No  . Sexual activity: Not on file  Lifestyle  . Physical activity:    Days per week: Not on file    Minutes per session: Not on file  . Stress: Not on file  Relationships  . Social connections:    Talks on phone: Not on file    Gets together: Not on file    Attends religious service: Not on file    Active member of club or organization: Not on file    Attends meetings of clubs or organizations: Not on file    Relationship status: Not on file  Other Topics Concern  . Not on file  Social History Narrative  . Not on file     Review of Systems: General: Negative for anorexia, weight loss, fever, chills, fatigue, weakness. ENT: Negative for hoarseness, difficulty swallowing , nasal congestion. CV: Negative for chest pain, angina, palpitations, dyspnea on exertion, peripheral edema.  Respiratory: Negative for dyspnea at rest, dyspnea on exertion, cough, sputum, wheezing.  GI: See history of present illness. MS: Chronic neck and back issues.  Derm: Negative for rash or itching.  Neuro: Negative for  memory loss, confusion.  Endo: Negative for unusual weight change.  Heme: Negative for bruising or bleeding. Allergy: Negative for rash or hives.   Physical Exam: BP (!) 146/96   Pulse 73   Temp 98.4 F (36.9 C) (Oral)   Ht 5' 5" (1.651 m)   Wt 219 lb 9.6 oz (99.6 kg)   BMI 36.54 kg/m  General:   Alert and oriented. Pleasant and cooperative. Well-nourished and well-developed.  Eyes:  Without icterus, sclera clear and conjunctiva pink.  Ears:  Normal auditory acuity. Cardiovascular:  S1, S2 present without murmurs appreciated. Extremities without clubbing or edema. Respiratory:  Clear to auscultation bilaterally. No wheezes, rales, or rhonchi. No distress.  Gastrointestinal:  +BS, rounded but soft, non-tender and non-distended. No HSM noted. No guarding or rebound. No masses appreciated. No flapping tremors noted. Rectal:  Deferred  Musculoskalatal:  Symmetrical without gross deformities. Neurologic:  Alert and oriented x4;  grossly normal neurologically. Psych:  Alert and cooperative. Normal mood and affect. Heme/Lymph/Immune: NNo excessive bruising noted.    06/07/2017 10:25 AM   Disclaimer: This note was dictated with voice recognition software. Similar sounding words can inadvertently be transcribed and may not be corrected upon review.

## 2017-06-07 ENCOUNTER — Ambulatory Visit: Payer: BLUE CROSS/BLUE SHIELD | Admitting: Nurse Practitioner

## 2017-06-07 ENCOUNTER — Encounter: Payer: Self-pay | Admitting: Nurse Practitioner

## 2017-06-07 VITALS — BP 146/96 | HR 73 | Temp 98.4°F | Ht 65.0 in | Wt 219.6 lb

## 2017-06-07 DIAGNOSIS — K7460 Unspecified cirrhosis of liver: Secondary | ICD-10-CM

## 2017-06-07 DIAGNOSIS — R945 Abnormal results of liver function studies: Secondary | ICD-10-CM | POA: Diagnosis not present

## 2017-06-07 DIAGNOSIS — R7989 Other specified abnormal findings of blood chemistry: Secondary | ICD-10-CM

## 2017-06-07 NOTE — Progress Notes (Signed)
CC'D TO PCP °

## 2017-06-07 NOTE — Assessment & Plan Note (Signed)
The patient has intermittently elevated transaminases in the setting of cirrhosis likely due to [place].  His last ALT was 56.  Typically it is normal.  We will continue to monitor routine labs and imaging based on his routine cirrhosis care.  Continue current medications.  Return for follow-up in 6 months.

## 2017-06-07 NOTE — Assessment & Plan Note (Addendum)
Overall his liver disease seems very well compensated.  He is up-to-date on labs and imaging.  No overt hepatic symptoms.  Denies any further GI symptoms.  Based on his recently completed labs by primary care his meld and child Pugh calculations are below.  Recommend he follow-up in 6 months for updated labs and imaging.  Continue other current medications.  MELD: 8 Child-Pugh: A

## 2017-06-07 NOTE — Patient Instructions (Signed)
1. We will see you back in our office in 6 months. 2. At that time we can update your labs and imaging. 3. Continue your current medications. 4. Call us if you have any questions or concerns. 5. Overall, things look great!    It was great to see you today!  I hope you have a good spring and summer!    At [facility] we value your feedback. You may receive a survey about your visit today. Please share your experience as we strive to create trusing relationships with our patients to provide genuine, compassionate, quality care.

## 2017-06-10 ENCOUNTER — Other Ambulatory Visit: Payer: Self-pay | Admitting: Family Medicine

## 2017-06-11 ENCOUNTER — Other Ambulatory Visit: Payer: Self-pay | Admitting: Family Medicine

## 2017-06-15 ENCOUNTER — Ambulatory Visit (INDEPENDENT_AMBULATORY_CARE_PROVIDER_SITE_OTHER): Payer: BLUE CROSS/BLUE SHIELD | Admitting: Family Medicine

## 2017-06-15 ENCOUNTER — Encounter: Payer: Self-pay | Admitting: Family Medicine

## 2017-06-15 VITALS — BP 134/82 | Ht 65.0 in | Wt 220.6 lb

## 2017-06-15 DIAGNOSIS — K7460 Unspecified cirrhosis of liver: Secondary | ICD-10-CM

## 2017-06-15 DIAGNOSIS — Z0000 Encounter for general adult medical examination without abnormal findings: Secondary | ICD-10-CM

## 2017-06-15 DIAGNOSIS — I10 Essential (primary) hypertension: Secondary | ICD-10-CM | POA: Diagnosis not present

## 2017-06-15 DIAGNOSIS — E7849 Other hyperlipidemia: Secondary | ICD-10-CM | POA: Diagnosis not present

## 2017-06-15 DIAGNOSIS — E119 Type 2 diabetes mellitus without complications: Secondary | ICD-10-CM | POA: Diagnosis not present

## 2017-06-15 DIAGNOSIS — K219 Gastro-esophageal reflux disease without esophagitis: Secondary | ICD-10-CM | POA: Insufficient documentation

## 2017-06-15 DIAGNOSIS — Z0001 Encounter for general adult medical examination with abnormal findings: Secondary | ICD-10-CM

## 2017-06-15 NOTE — Progress Notes (Signed)
[facility] Fax in folder in office

## 2017-06-15 NOTE — Progress Notes (Signed)
Subjective:    Patient ID: [name], male    DOB: April 12, 1955, 62 y.o.   MRN: [id number]  HPI  The patient comes in today for a wellness visit.    A review of their health history was completed.  A review of medications was also completed.  Any needed refills; yes  Eating habits: eat healthy  Falls/  MVA accidents in past few months: none  Regular exercise: not regular  Specialist pt sees on regular basis: Gi Dr  Preventative health issues were discussed.   Additional concerns: hard time going to sleep Patient has difficult time resting at night Patient also does not have a consistent going to bedtime or get up time Patient has low interest in things Suffers with depression not suicidal Not interested in counseling or seeing a specialist Has cirrhosis tries to watch his diet Significant obesity tries to minimize intake Patient for blood pressure check up. Patient relates compliance with meds. Todays BP reviewed with the patient. Patient denies issues with medication. Patient relates reasonable diet. Patient tries to minimize salt. Patient aware of BP goals.  Patient here for follow-up regarding cholesterol.  Patient does try to maintain a reasonable diet.  Patient does take the medication on a regular basis.  Denies missing a dose.  The patient denies any obvious side effects.  Prior blood work results reviewed with the patient.  The patient is aware of his cholesterol goals and the need to keep it under good control to lessen the risk of disease.   is patient is here today regarding follow-up regarding depression.  Patient relates compliance with medication.  Patient understands importance of taking the medication.  Patient denies any significant side effects.  Patient relates that the medication is still beneficial for them and they would like to continue the medication.  Patient is not having any threats of homicide or suicide.   Review of Systems    Constitutional: Negative for activity change, appetite change and fatigue.  HENT: Negative for congestion and rhinorrhea.   Respiratory: Negative for cough, chest tightness and shortness of breath.   Cardiovascular: Negative for chest pain and leg swelling.  Gastrointestinal: Negative for abdominal pain, diarrhea and nausea.  Endocrine: Negative for polydipsia and polyphagia.  Genitourinary: Negative for dysuria and hematuria.  Neurological: Negative for weakness and headaches.  Psychiatric/Behavioral: Negative for confusion and dysphoric mood.       Objective:   Physical Exam  Constitutional: He appears well-nourished. No distress.  HENT:  Head: Normocephalic and atraumatic.  Eyes: Right eye exhibits no discharge. Left eye exhibits no discharge.  Neck: No tracheal deviation present.  Cardiovascular: Normal rate, regular rhythm and normal heart sounds.  No murmur heard. Pulmonary/Chest: Effort normal and breath sounds normal. No respiratory distress. He has no wheezes.  Musculoskeletal: He exhibits no edema.  Lymphadenopathy:    He has no cervical adenopathy.  Neurological: He is alert.  Skin: Skin is warm. No rash noted.  Psychiatric: His behavior is normal.  Vitals reviewed.  Results for orders placed or performed in visit on 05/22/17  INR/PT  Result Value Ref Range   INR 1.2 0.8 - 1.2   Prothrombin Time 12.0 9.1 - 12.0 sec  AFP tumor marker  Result Value Ref Range   AFP, Serum, Tumor Marker 6.4 0.0 - 8.3 ng/mL  Comprehensive Metabolic Panel (CMET)  Result Value Ref Range   Glucose 192 (H) 65 - 99 mg/dL   BUN 10 8 - 27 mg/dL  Creatinine, Ser 0.94 0.76 - 1.27 mg/dL   GFR calc non Af Amer 88 >59 mL/min/1.73   GFR calc Af Amer 101 >59 mL/min/1.73   BUN/Creatinine Ratio 11 10 - 24   Sodium 143 134 - 144 mmol/L   Potassium 3.6 3.5 - 5.2 mmol/L   Chloride 100 96 - 106 mmol/L   CO2 31 (H) 20 - 29 mmol/L   Calcium 9.6 8.6 - 10.2 mg/dL   Total Protein 6.9 6.0 - 8.5 g/dL    Albumin 4.3 3.6 - 4.8 g/dL   Globulin, Total 2.6 1.5 - 4.5 g/dL   Albumin/Globulin Ratio 1.7 1.2 - 2.2   Bilirubin Total 0.8 0.0 - 1.2 mg/dL   Alkaline Phosphatase 106 39 - 117 IU/L   AST 38 0 - 40 IU/L   ALT 56 (H) 0 - 44 IU/L  PSA  Result Value Ref Range   Prostate Specific Ag, Serum 0.5 0.0 - 4.0 ng/mL  CBC with Differential/Platelet  Result Value Ref Range   WBC 6.3 3.4 - 10.8 x10E3/uL   RBC 5.47 4.14 - 5.80 x10E6/uL   Hemoglobin 17.3 13.0 - 17.7 g/dL   Hematocrit 50.6 37.5 - 51.0 %   MCV 93 79 - 97 fL   MCH 31.6 26.6 - 33.0 pg   MCHC 34.2 31.5 - 35.7 g/dL   RDW 15.0 12.3 - 15.4 %   Platelets 133 (L) 150 - 379 x10E3/uL   Neutrophils 55 Not Estab. %   Lymphs 30 Not Estab. %   Monocytes 10 Not Estab. %   Eos 4 Not Estab. %   Basos 1 Not Estab. %   Neutrophils Absolute 3.5 1.4 - 7.0 x10E3/uL   Lymphocytes Absolute 1.9 0.7 - 3.1 x10E3/uL   Monocytes Absolute 0.6 0.1 - 0.9 x10E3/uL   EOS (ABSOLUTE) 0.3 0.0 - 0.4 x10E3/uL   Basophils Absolute 0.0 0.0 - 0.2 x10E3/uL   Immature Granulocytes 0 Not Estab. %   Immature Grans (Abs) 0.0 0.0 - 0.1 x10E3/uL  Hemoglobin A1c  Result Value Ref Range   Hgb A1c MFr Bld 6.9 (H) 4.8 - 5.6 %   Est. average glucose Bld gHb Est-mCnc 151 mg/dL  Lipid panel  Result Value Ref Range   Cholesterol, Total 173 100 - 199 mg/dL   Triglycerides 236 (H) 0 - 149 mg/dL   HDL 33 (L) >39 mg/dL   VLDL Cholesterol Cal 47 (H) 5 - 40 mg/dL   LDL Calculated 93 0 - 99 mg/dL   Chol/HDL Ratio 5.2 (H) 0.0 - 5.0 ratio          Assessment & Plan:  Adult wellness-complete.wellness physical was conducted today. Importance of diet and exercise were discussed in detail. In addition to this a discussion regarding safety was also covered. We also reviewed over immunizations and gave recommendations regarding current immunization needed for age. In addition to this additional areas were also touched on including: Preventative health exams needed: Colonoscopy  patient defers on colonoscopy Does agree to do home stool test for blood He will send back to [place]  Patient was advised yearly wellness exam  Cholesterol fairly good control I recommend switching to Crestor to get better control of his cholesterol Blood pressure good control continue current measures Moderate obesity patient encouraged to watch diet watch portions exercise try to lose weight Depression patient denies being suicidal He does take his antidepressants on a regular basis He does not want to go see psychiatrist or specialist Reflux under good control with medication  Cirrhosis watch his diet tries to lose weight Diabetes diet controlled doing well  I reviewed over his printout from the local pharmacy.  It is possible that he may be getting some of his medications through mail order pharmacy but at the same time we will need to verify with his wife.  Nurses will be calling the wife to help verify medication list.

## 2017-06-15 NOTE — Patient Instructions (Signed)
DASH Eating Plan DASH stands for "Dietary Approaches to Stop Hypertension." The DASH eating plan is a healthy eating plan that has been shown to reduce high blood pressure (hypertension). It may also reduce your risk for type 2 diabetes, heart disease, and stroke. The DASH eating plan may also help with weight loss. What are tips for following this plan? General guidelines  Avoid eating more than 2,300 mg (milligrams) of salt (sodium) a day. If you have hypertension, you may need to reduce your sodium intake to 1,500 mg a day.  Limit alcohol intake to no more than 1 drink a day for nonpregnant women and 2 drinks a day for men. One drink equals 12 oz of beer, 5 oz of wine, or 1 oz of hard liquor.  Work with your health care provider to maintain a healthy body weight or to lose weight. Ask what an ideal weight is for you.  Get at least 30 minutes of exercise that causes your heart to beat faster (aerobic exercise) most days of the week. Activities may include walking, swimming, or biking.  Work with your health care provider or diet and nutrition specialist (dietitian) to adjust your eating plan to your individual calorie needs. Reading food labels  Check food labels for the amount of sodium per serving. Choose foods with less than 5 percent of the Daily Value of sodium. Generally, foods with less than 300 mg of sodium per serving fit into this eating plan.  To find whole grains, look for the word "whole" as the first word in the ingredient list. Shopping  Buy products labeled as "low-sodium" or "no salt added."  Buy fresh foods. Avoid canned foods and premade or frozen meals. Cooking  Avoid adding salt when cooking. Use salt-free seasonings or herbs instead of table salt or sea salt. Check with your health care provider or pharmacist before using salt substitutes.  Do not fry foods. Cook foods using healthy methods such as baking, boiling, grilling, and broiling instead.  Cook with  heart-healthy oils, such as olive, canola, soybean, or sunflower oil. Meal planning   Eat a balanced diet that includes: ? 5 or more servings of fruits and vegetables each day. At each meal, try to fill half of your plate with fruits and vegetables. ? Up to 6-8 servings of whole grains each day. ? Less than 6 oz of lean meat, poultry, or fish each day. A 3-oz serving of meat is about the same size as a deck of cards. One egg equals 1 oz. ? 2 servings of low-fat dairy each day. ? A serving of nuts, seeds, or beans 5 times each week. ? Heart-healthy fats. Healthy fats called Omega-3 fatty acids are found in foods such as flaxseeds and coldwater fish, like sardines, salmon, and mackerel.  Limit how much you eat of the following: ? Canned or prepackaged foods. ? Food that is high in trans fat, such as fried foods. ? Food that is high in saturated fat, such as fatty meat. ? Sweets, desserts, sugary drinks, and other foods with added sugar. ? Full-fat dairy products.  Do not salt foods before eating.  Try to eat at least 2 vegetarian meals each week.  Eat more home-cooked food and less restaurant, buffet, and fast food.  When eating at a restaurant, ask that your food be prepared with less salt or no salt, if possible. What foods are recommended? The items listed may not be a complete list. Talk with your dietitian about what   dietary choices are best for you. Grains Whole-grain or whole-wheat bread. Whole-grain or whole-wheat pasta. Brown rice. Oatmeal. Quinoa. Bulgur. Whole-grain and low-sodium cereals. Pita bread. Low-fat, low-sodium crackers. Whole-wheat flour tortillas. Vegetables Fresh or frozen vegetables (raw, steamed, roasted, or grilled). Low-sodium or reduced-sodium tomato and vegetable juice. Low-sodium or reduced-sodium tomato sauce and tomato paste. Low-sodium or reduced-sodium canned vegetables. Fruits All fresh, dried, or frozen fruit. Canned fruit in natural juice (without  added sugar). Meat and other protein foods Skinless chicken or turkey. Ground chicken or turkey. Pork with fat trimmed off. Fish and seafood. Egg whites. Dried beans, peas, or lentils. Unsalted nuts, nut butters, and seeds. Unsalted canned beans. Lean cuts of beef with fat trimmed off. Low-sodium, lean deli meat. Dairy Low-fat (1%) or fat-free (skim) milk. Fat-free, low-fat, or reduced-fat cheeses. Nonfat, low-sodium ricotta or cottage cheese. Low-fat or nonfat yogurt. Low-fat, low-sodium cheese. Fats and oils Soft margarine without trans fats. Vegetable oil. Low-fat, reduced-fat, or light mayonnaise and salad dressings (reduced-sodium). Canola, safflower, olive, soybean, and sunflower oils. Avocado. Seasoning and other foods Herbs. Spices. Seasoning mixes without salt. Unsalted popcorn and pretzels. Fat-free sweets. What foods are not recommended? The items listed may not be a complete list. Talk with your dietitian about what dietary choices are best for you. Grains Baked goods made with fat, such as croissants, muffins, or some breads. Dry pasta or rice meal packs. Vegetables Creamed or fried vegetables. Vegetables in a cheese sauce. Regular canned vegetables (not low-sodium or reduced-sodium). Regular canned tomato sauce and paste (not low-sodium or reduced-sodium). Regular tomato and vegetable juice (not low-sodium or reduced-sodium). Pickles. Olives. Fruits Canned fruit in a light or heavy syrup. Fried fruit. Fruit in cream or butter sauce. Meat and other protein foods Fatty cuts of meat. Ribs. Fried meat. Bacon. Sausage. Bologna and other processed lunch meats. Salami. Fatback. Hotdogs. Bratwurst. Salted nuts and seeds. Canned beans with added salt. Canned or smoked fish. Whole eggs or egg yolks. Chicken or turkey with skin. Dairy Whole or 2% milk, cream, and half-and-half. Whole or full-fat cream cheese. Whole-fat or sweetened yogurt. Full-fat cheese. Nondairy creamers. Whipped toppings.  Processed cheese and cheese spreads. Fats and oils Butter. Stick margarine. Lard. Shortening. Ghee. Bacon fat. Tropical oils, such as coconut, palm kernel, or palm oil. Seasoning and other foods Salted popcorn and pretzels. Onion salt, garlic salt, seasoned salt, table salt, and sea salt. Worcestershire sauce. Tartar sauce. Barbecue sauce. Teriyaki sauce. Soy sauce, including reduced-sodium. Steak sauce. Canned and packaged gravies. Fish sauce. Oyster sauce. Cocktail sauce. Horseradish that you find on the shelf. Ketchup. Mustard. Meat flavorings and tenderizers. Bouillon cubes. Hot sauce and Tabasco sauce. Premade or packaged marinades. Premade or packaged taco seasonings. Relishes. Regular salad dressings. Where to find more information:  National Heart, Lung, and Blood Institute: www.nhlbi.nih.gov  American Heart Association: www.heart.org Summary  The DASH eating plan is a healthy eating plan that has been shown to reduce high blood pressure (hypertension). It may also reduce your risk for type 2 diabetes, heart disease, and stroke.  With the DASH eating plan, you should limit salt (sodium) intake to 2,300 mg a day. If you have hypertension, you may need to reduce your sodium intake to 1,500 mg a day.  When on the DASH eating plan, aim to eat more fresh fruits and vegetables, whole grains, lean proteins, low-fat dairy, and heart-healthy fats.  Work with your health care provider or diet and nutrition specialist (dietitian) to adjust your eating plan to your individual   calorie needs. This information is not intended to replace advice given to you by your health care provider. Make sure you discuss any questions you have with your health care provider. Document Released: 02/10/2011 Document Revised: 02/15/2016 Document Reviewed: 02/15/2016 Elsevier Interactive Patient Education  2018 Elsevier Inc.  

## 2017-06-21 NOTE — Progress Notes (Signed)
What was faxed over from [facility] did not match how the patient is taking his medication a phone call was placed to his wife and she stated she will write down what he is taking and send it to [place]

## 2017-06-24 ENCOUNTER — Other Ambulatory Visit: Payer: Self-pay | Admitting: Family Medicine

## 2017-07-10 ENCOUNTER — Other Ambulatory Visit: Payer: Self-pay | Admitting: Family Medicine

## 2017-07-10 NOTE — Telephone Encounter (Signed)
Trying to verify when was the last time he got this filled?

## 2017-07-10 NOTE — Telephone Encounter (Signed)
Last refilled 04/15/2017

## 2017-07-11 NOTE — Telephone Encounter (Signed)
May have 6 refills 

## 2017-07-27 ENCOUNTER — Telehealth: Payer: Self-pay | Admitting: Family Medicine

## 2017-07-27 ENCOUNTER — Other Ambulatory Visit: Payer: Self-pay | Admitting: Family Medicine

## 2017-07-27 MED ORDER — PREDNISONE 20 MG PO TABS: ORAL_TABLET | ORAL | 0 refills | Status: DC

## 2017-07-27 NOTE — Telephone Encounter (Signed)
3qd for 3d then 2qd for 3d then 1qd for 3d Prednisone taper will work best, 18 tablets, 20 mg tablet, This can affect sugars very important to eat healthy while on medicine

## 2017-07-27 NOTE — Telephone Encounter (Signed)
Meds sent to pharmacy and pt notified.

## 2017-07-27 NOTE — Telephone Encounter (Signed)
Patient has poison oak on his arms and legs.  He would like Rx called in.  [facility] [place]

## 2017-07-28 ENCOUNTER — Telehealth: Payer: Self-pay

## 2017-07-28 MED ORDER — PRAVASTATIN SODIUM 80 MG PO TABS: 80.0000 mg | ORAL_TABLET | Freq: Every day | ORAL | 1 refills | Status: DC

## 2017-07-28 NOTE — Telephone Encounter (Signed)
Medication sent in to the requesting pharmacy. 

## 2017-07-28 NOTE — Telephone Encounter (Signed)
Per [facility] they spoke with the pt about diabetes care and noticed the pt has not been on a [facility]in [facility] at [facility]  In the last 180 days .They state the pt wanted them to reach out to [place] on their behalf to determine if it is appropriate to start a statin therapy. Please send a new rx for statin therapy if appropriate.

## 2017-07-28 NOTE — Telephone Encounter (Signed)
This patient does not always take the medications that we prescribed.  Pravastatin 80 mg 1 daily is on his medication list.  Prescribed this with 90 tablets and 1 refill.  Patient is not always compliant with taking medicines pharmacy should be aware of this

## 2017-09-05 ENCOUNTER — Other Ambulatory Visit: Payer: Self-pay | Admitting: Family Medicine

## 2017-09-13 ENCOUNTER — Other Ambulatory Visit: Payer: Self-pay | Admitting: Family Medicine

## 2017-09-24 ENCOUNTER — Other Ambulatory Visit: Payer: Self-pay | Admitting: Family Medicine

## 2017-10-17 ENCOUNTER — Other Ambulatory Visit: Payer: Self-pay | Admitting: Family Medicine

## 2017-10-18 ENCOUNTER — Other Ambulatory Visit: Payer: Self-pay | Admitting: Family Medicine

## 2017-10-18 NOTE — Telephone Encounter (Signed)
May have this +1 refill needs follow-up office visit this fall

## 2017-12-07 ENCOUNTER — Ambulatory Visit: Payer: BLUE CROSS/BLUE SHIELD | Admitting: Nurse Practitioner

## 2017-12-09 ENCOUNTER — Other Ambulatory Visit: Payer: Self-pay | Admitting: Family Medicine

## 2017-12-11 ENCOUNTER — Other Ambulatory Visit: Payer: Self-pay | Admitting: Family Medicine

## 2017-12-21 ENCOUNTER — Telehealth: Payer: Self-pay | Admitting: Family Medicine

## 2017-12-21 NOTE — Telephone Encounter (Signed)
Pt would like to have his labs ordered he has every three months and he asked Dr. Nicki Reaper include liver so he can get it all done at once.

## 2017-12-21 NOTE — Telephone Encounter (Signed)
Last labs 05/29/17 lipid, a1c, cbc, psa, cmp, afp tumor marker, inr/pt. Also please read message below

## 2017-12-24 NOTE — Telephone Encounter (Signed)
Lipid, met 7, A1c, CBC-hyperlipidemia, hypertension, diabetes, thrombocytopenia  Also patient is due for one-time testing of HIV, hepatitis C antibodies-please let him know this is recommended by the Centers for Disease Control thank you  Please follow-up later this fall

## 2017-12-25 ENCOUNTER — Other Ambulatory Visit: Payer: Self-pay | Admitting: Family Medicine

## 2017-12-25 DIAGNOSIS — E7849 Other hyperlipidemia: Secondary | ICD-10-CM

## 2017-12-25 DIAGNOSIS — E876 Hypokalemia: Secondary | ICD-10-CM

## 2017-12-25 DIAGNOSIS — Z114 Encounter for screening for human immunodeficiency virus [HIV]: Secondary | ICD-10-CM

## 2017-12-25 DIAGNOSIS — I10 Essential (primary) hypertension: Secondary | ICD-10-CM

## 2017-12-25 DIAGNOSIS — Z1159 Encounter for screening for other viral diseases: Secondary | ICD-10-CM

## 2017-12-25 DIAGNOSIS — E119 Type 2 diabetes mellitus without complications: Secondary | ICD-10-CM

## 2017-12-25 DIAGNOSIS — D696 Thrombocytopenia, unspecified: Secondary | ICD-10-CM

## 2017-12-25 NOTE — Telephone Encounter (Signed)
Left message to return call with pt - orders have NOT been placed yet because I wanted to discuss the HIV test first to make sure wants

## 2017-12-25 NOTE — Telephone Encounter (Signed)
Pt returned call and agreed to HIV and HEP C testing. Lab orders placed. Pt is aware.

## 2017-12-28 LAB — LIPID PANEL
Chol/HDL Ratio: 5 ratio (ref 0.0–5.0)
Cholesterol, Total: 149 mg/dL (ref 100–199)
HDL: 30 mg/dL — ABNORMAL LOW
LDL Calculated: 82 mg/dL (ref 0–99)
Triglycerides: 187 mg/dL — ABNORMAL HIGH (ref 0–149)
VLDL Cholesterol Cal: 37 mg/dL (ref 5–40)

## 2017-12-28 LAB — HIV ANTIBODY (ROUTINE TESTING W REFLEX): HIV Screen 4th Generation wRfx: NONREACTIVE

## 2017-12-28 LAB — BASIC METABOLIC PANEL WITH GFR
BUN/Creatinine Ratio: 9 — ABNORMAL LOW (ref 10–24)
BUN: 9 mg/dL (ref 8–27)
CO2: 28 mmol/L (ref 20–29)
Calcium: 9.2 mg/dL (ref 8.6–10.2)
Chloride: 99 mmol/L (ref 96–106)
Creatinine, Ser: 0.96 mg/dL (ref 0.76–1.27)
GFR calc Af Amer: 99 mL/min/1.73
GFR calc non Af Amer: 86 mL/min/1.73
Glucose: 157 mg/dL — ABNORMAL HIGH (ref 65–99)
Potassium: 3.1 mmol/L — ABNORMAL LOW (ref 3.5–5.2)
Sodium: 142 mmol/L (ref 134–144)

## 2017-12-28 LAB — HEMOGLOBIN A1C
ESTIMATED AVERAGE GLUCOSE: 197 mg/dL
Hgb A1c MFr Bld: 8.5 % — ABNORMAL HIGH (ref 4.8–5.6)

## 2017-12-28 LAB — CBC WITH DIFFERENTIAL/PLATELET
Basophils Absolute: 0.1 x10E3/uL (ref 0.0–0.2)
Basos: 1 %
EOS (ABSOLUTE): 0.3 x10E3/uL (ref 0.0–0.4)
Eos: 4 %
Hematocrit: 47.7 % (ref 37.5–51.0)
Hemoglobin: 16.2 g/dL (ref 13.0–17.7)
Immature Grans (Abs): 0 x10E3/uL (ref 0.0–0.1)
Immature Granulocytes: 0 %
Lymphocytes Absolute: 2.2 x10E3/uL (ref 0.7–3.1)
Lymphs: 31 %
MCH: 30.9 pg (ref 26.6–33.0)
MCHC: 34 g/dL (ref 31.5–35.7)
MCV: 91 fL (ref 79–97)
Monocytes Absolute: 0.6 x10E3/uL (ref 0.1–0.9)
Monocytes: 9 %
Neutrophils Absolute: 3.9 x10E3/uL (ref 1.4–7.0)
Neutrophils: 55 %
Platelets: 132 x10E3/uL — ABNORMAL LOW (ref 150–450)
RBC: 5.25 x10E6/uL (ref 4.14–5.80)
RDW: 14.1 % (ref 12.3–15.4)
WBC: 7 x10E3/uL (ref 3.4–10.8)

## 2017-12-28 LAB — HEPATITIS C ANTIBODY: Hep C Virus Ab: 0.2 s/co ratio (ref 0.0–0.9)

## 2018-01-03 ENCOUNTER — Telehealth: Payer: Self-pay | Admitting: *Deleted

## 2018-01-03 DIAGNOSIS — E876 Hypokalemia: Secondary | ICD-10-CM

## 2018-01-03 NOTE — Telephone Encounter (Signed)
Given this new information I recommend the following 20 mEq potassium Take 2 in the morning 1 in the evening #90 3 refills Follow-up metabolic 7 in 2 to 3 weeks Keep follow-up office visit somewhere within the next 30 to 45 days

## 2018-01-03 NOTE — Telephone Encounter (Signed)
I called and left a message to r/c. 

## 2018-01-03 NOTE — Telephone Encounter (Signed)
Pt states he talked to a nurse yesterday about his potassium med and told her he took one a day but after talking to his wife who does his meds he has been taking 2 qd. Was told yesterday to increase to 2 per day so do you want to increase since he was already taking 2 per day. Potassium on bloodwork was 3.1 on 12/27/17.   [facility] [place].

## 2018-01-04 MED ORDER — POTASSIUM CHLORIDE CRYS ER 20 MEQ PO TBCR: EXTENDED_RELEASE_TABLET | ORAL | 3 refills | Status: DC

## 2018-01-04 NOTE — Telephone Encounter (Signed)
Patient advised Given this new information Dr Nicki Reaper recommends the following 20 mEq potassium Take 2 in the morning 1 in the evening #90 3 refills Follow-up metabolic 7 in 2 to 3 weeks Keep follow-up office visit somewhere within the next 30 to 45 days Patient verbalized understanding. Prescription sent electronically to pharmacy. Blood work ordered in [employer]

## 2018-01-10 ENCOUNTER — Other Ambulatory Visit: Payer: Self-pay | Admitting: Family Medicine

## 2018-01-17 ENCOUNTER — Ambulatory Visit: Payer: BLUE CROSS/BLUE SHIELD | Admitting: Family Medicine

## 2018-01-17 ENCOUNTER — Encounter: Payer: Self-pay | Admitting: Family Medicine

## 2018-01-17 VITALS — BP 138/88 | Ht 65.0 in | Wt 218.1 lb

## 2018-01-17 DIAGNOSIS — E119 Type 2 diabetes mellitus without complications: Secondary | ICD-10-CM

## 2018-01-17 DIAGNOSIS — I10 Essential (primary) hypertension: Secondary | ICD-10-CM | POA: Diagnosis not present

## 2018-01-17 DIAGNOSIS — R413 Other amnesia: Secondary | ICD-10-CM

## 2018-01-17 DIAGNOSIS — Z23 Encounter for immunization: Secondary | ICD-10-CM

## 2018-01-17 DIAGNOSIS — F321 Major depressive disorder, single episode, moderate: Secondary | ICD-10-CM

## 2018-01-17 DIAGNOSIS — Z125 Encounter for screening for malignant neoplasm of prostate: Secondary | ICD-10-CM | POA: Diagnosis not present

## 2018-01-17 DIAGNOSIS — E7849 Other hyperlipidemia: Secondary | ICD-10-CM

## 2018-01-17 LAB — BASIC METABOLIC PANEL
BUN / CREAT RATIO: 11 (ref 10–24)
BUN: 11 mg/dL (ref 8–27)
CHLORIDE: 98 mmol/L (ref 96–106)
CO2: 27 mmol/L (ref 20–29)
CREATININE: 1 mg/dL (ref 0.76–1.27)
Calcium: 9.6 mg/dL (ref 8.6–10.2)
GFR calc Af Amer: 94 mL/min/{1.73_m2} (ref >59)
GFR calc non Af Amer: 81 mL/min/{1.73_m2} (ref >59)
GLUCOSE: 229 mg/dL — AB (ref 65–99)
Potassium: 3.2 mmol/L — ABNORMAL LOW (ref 3.5–5.2)
SODIUM: 141 mmol/L (ref 134–144)

## 2018-01-17 MED ORDER — POTASSIUM CHLORIDE CRYS ER 20 MEQ PO TBCR: EXTENDED_RELEASE_TABLET | ORAL | 5 refills | Status: DC

## 2018-01-17 MED ORDER — SITAGLIPTIN PHOS-METFORMIN HCL 50-500 MG PO TABS: 1.0000 | ORAL_TABLET | Freq: Two times a day (BID) | ORAL | 5 refills | Status: DC

## 2018-01-17 NOTE — Progress Notes (Signed)
Subjective:    Patient ID: Shawn Greaves., male    DOB: [birth year], 62 y.o.   MRN: [id number]  HPI  Patient is here today to follow up on his chronic illnesses. He is diabetic and takes Metformin 500 mg 1 in am and 2 Q pm. His last A1c was done on 12/27/2017 at 8.5. Also here for Add check.  Patient was seen today for ADD checkup.  This patient does have ADD.  Patient takes medications for this.  If this does help control overall symptoms.  Please see below. -weight, vital signs reviewed.  The following items were covered. -Compliance with medication : Not taking Q day  -Problems with completing homework, paying attention/taking good notes in school: No  -grades: Does not apply  - Eating patterns : Good  -sleeping: Does not sleep well  -Additional issues or questions: talk about sleep   The patient was seen today as part of a comprehensive diabetic check up.the patient does have diabetes.  The patient follows here on a regular basis.  The patient relates medication compliance. No significant side effects to the medications. Denies any low glucose spells. Relates compliance with diet to a reasonable level. Patient does do labwork intermittently and understands the dangers of diabetes.   Patient is having some depression symptoms finds himself anxious difficult time sleeping difficult time focusing intermittent spells of feeling down not suicidal he is under a fair amount of stress states he really does not have anyone to talk to about it  Patient also admits that he has a hard time remembering things he often has to be reminded.  Has a hard time staying on task.  Forgets items before he can really think them out.  Denies any other particular troubles overall though.  Not getting lost with driving not having trouble functioning  Results for orders placed or performed in visit on 63/01/60  Basic metabolic panel  Result Value Ref Range   Glucose 229 (H) 65 - 99 mg/dL   BUN 11 8 -  27 mg/dL   Creatinine, Ser 1.00 0.76 - 1.27 mg/dL   GFR calc non Af Amer 81 >59 mL/min/1.73   GFR calc Af Amer 94 >59 mL/min/1.73   BUN/Creatinine Ratio 11 10 - 24   Sodium 141 134 - 144 mmol/L   Potassium 3.2 (L) 3.5 - 5.2 mmol/L   Chloride 98 96 - 106 mmol/L   CO2 27 20 - 29 mmol/L   Calcium 9.6 8.6 - 10.2 mg/dL      Review of Systems  Constitutional: Negative for diaphoresis and fatigue.  HENT: Negative for congestion and rhinorrhea.   Respiratory: Negative for cough and shortness of breath.   Cardiovascular: Negative for chest pain and leg swelling.  Gastrointestinal: Negative for abdominal pain and diarrhea.  Skin: Negative for color change and rash.  Neurological: Negative for dizziness and headaches.  Psychiatric/Behavioral: Negative for behavioral problems and confusion.       Objective:   Physical Exam  Constitutional: He appears well-nourished. No distress.  HENT:  Head: Normocephalic and atraumatic.  Eyes: Right eye exhibits no discharge. Left eye exhibits no discharge.  Neck: No tracheal deviation present.  Cardiovascular: Normal rate, regular rhythm and normal heart sounds.  No murmur heard. Pulmonary/Chest: Effort normal and breath sounds normal. No respiratory distress.  Musculoskeletal: He exhibits no edema.  Lymphadenopathy:    He has no cervical adenopathy.  Neurological: He is alert. Coordination normal.  Skin: Skin is warm and  dry.  Psychiatric: He has a normal mood and affect. His behavior is normal.  Vitals reviewed.   [place]real cognitive assessment was completed his main issue was short-term memory loss otherwise his cognitive function was good      Assessment & Plan:  Diabetes subpar control change medication use Janumet twice daily recheck A1c 3 months  Hyperlipidemia continue current measures watch diet closely  Screening PSA recommended  Mild memory issues noted.  Patient's cognitive skills overall doing well  25 minutes was spent  with the patient.  This statement verifies that 25 minutes was indeed spent with the patient.  More than 50% of this visit-total duration of the visit-was spent in counseling and coordination of care. The issues that the patient came in for today as reflected in the diagnosis (s) please refer to documentation for further details.  Significant depression referral to psychiatry.  Patient not suicidal  Follow-up office visit within 6 months

## 2018-01-17 NOTE — Patient Instructions (Addendum)
Diabetes Mellitus and Nutrition When you have diabetes (diabetes mellitus), it is very important to have healthy eating habits because your blood sugar (glucose) levels are greatly affected by what you eat and drink. Eating healthy foods in the appropriate amounts, at about the same times every day, can help you:  Control your blood glucose.  Lower your risk of heart disease.  Improve your blood pressure.  Reach or maintain a healthy weight.  Every person with diabetes is different, and each person has different needs for a meal plan. Your health care provider may recommend that you work with a diet and nutrition specialist (dietitian) to make a meal plan that is best for you. Your meal plan may vary depending on factors such as:  The calories you need.  The medicines you take.  Your weight.  Your blood glucose, blood pressure, and cholesterol levels.  Your activity level.  Other health conditions you have, such as heart or kidney disease.  How do carbohydrates affect me? Carbohydrates affect your blood glucose level more than any other type of food. Eating carbohydrates naturally increases the amount of glucose in your blood. Carbohydrate counting is a method for keeping track of how many carbohydrates you eat. Counting carbohydrates is important to keep your blood glucose at a healthy level, especially if you use insulin or take certain oral diabetes medicines. It is important to know how many carbohydrates you can safely have in each meal. This is different for every person. Your dietitian can help you calculate how many carbohydrates you should have at each meal and for snack. Foods that contain carbohydrates include:  Bread, cereal, rice, pasta, and crackers.  Potatoes and corn.  Peas, beans, and lentils.  Milk and yogurt.  Fruit and juice.  Desserts, such as cakes, cookies, ice cream, and candy.  How does alcohol affect me? Alcohol can cause a sudden decrease in blood  glucose (hypoglycemia), especially if you use insulin or take certain oral diabetes medicines. Hypoglycemia can be a life-threatening condition. Symptoms of hypoglycemia (sleepiness, dizziness, and confusion) are similar to symptoms of having too much alcohol. If your health care provider says that alcohol is safe for you, follow these guidelines:  Limit alcohol intake to no more than 1 drink per day for nonpregnant women and 2 drinks per day for men. One drink equals 12 oz of beer, 5 oz of wine, or 1 oz of hard liquor.  Do not drink on an empty stomach.  Keep yourself hydrated with water, diet soda, or unsweetened iced tea.  Keep in mind that regular soda, juice, and other mixers may contain a lot of sugar and must be counted as carbohydrates.  What are tips for following this plan? Reading food labels  Start by checking the serving size on the label. The amount of calories, carbohydrates, fats, and other nutrients listed on the label are based on one serving of the food. Many foods contain more than one serving per package.  Check the total grams (g) of carbohydrates in one serving. You can calculate the number of servings of carbohydrates in one serving by dividing the total carbohydrates by 15. For example, if a food has 30 g of total carbohydrates, it would be equal to 2 servings of carbohydrates.  Check the number of grams (g) of saturated and trans fats in one serving. Choose foods that have low or no amount of these fats.  Check the number of milligrams (mg) of sodium in one serving. Most people   should limit total sodium intake to less than 2,300 mg per day.  Always check the nutrition information of foods labeled as "low-fat" or "nonfat". These foods may be higher in added sugar or refined carbohydrates and should be avoided.  Talk to your dietitian to identify your daily goals for nutrients listed on the label. Shopping  Avoid buying canned, premade, or processed foods. These  foods tend to be high in fat, sodium, and added sugar.  Shop around the outside edge of the grocery store. This includes fresh fruits and vegetables, bulk grains, fresh meats, and fresh dairy. Cooking  Use low-heat cooking methods, such as baking, instead of high-heat cooking methods like deep frying.  Cook using healthy oils, such as olive, canola, or sunflower oil.  Avoid cooking with butter, cream, or high-fat meats. Meal planning  Eat meals and snacks regularly, preferably at the same times every day. Avoid going long periods of time without eating.  Eat foods high in fiber, such as fresh fruits, vegetables, beans, and whole grains. Talk to your dietitian about how many servings of carbohydrates you can eat at each meal.  Eat 4-6 ounces of lean protein each day, such as lean meat, chicken, fish, eggs, or tofu. 1 ounce is equal to 1 ounce of meat, chicken, or fish, 1 egg, or 1/4 cup of tofu.  Eat some foods each day that contain healthy fats, such as avocado, nuts, seeds, and fish. Lifestyle   Check your blood glucose regularly.  Exercise at least 30 minutes 5 or more days each week, or as told by your health care provider.  Take medicines as told by your health care provider.  Do not use any products that contain nicotine or tobacco, such as cigarettes and e-cigarettes. If you need help quitting, ask your health care provider.  Work with a counselor or diabetes educator to identify strategies to manage stress and any emotional and social challenges. What are some questions to ask my health care provider?  Do I need to meet with a diabetes educator?  Do I need to meet with a dietitian?  What number can I call if I have questions?  When are the best times to check my blood glucose? Where to find more information:  American Diabetes Association: diabetes.org/food-and-fitness/food  Academy of Nutrition and Dietetics:  www.eatright.org/resources/health/diseases-and-conditions/diabetes  National Institute of Diabetes and Digestive and Kidney Diseases (NIH): www.niddk.nih.gov/health-information/diabetes/overview/diet-eating-physical-activity Summary  A healthy meal plan will help you control your blood glucose and maintain a healthy lifestyle.  Working with a diet and nutrition specialist (dietitian) can help you make a meal plan that is best for you.  Keep in mind that carbohydrates and alcohol have immediate effects on your blood glucose levels. It is important to count carbohydrates and to use alcohol carefully. This information is not intended to replace advice given to you by your health care provider. Make sure you discuss any questions you have with your health care provider. Document Released: 11/18/2004 Document Revised: 03/28/2016 Document Reviewed: 03/28/2016 Elsevier Interactive Patient Education  2018 [employer]   DASH Eating Plan DASH stands for "Dietary Approaches to Stop Hypertension." The DASH eating plan is a healthy eating plan that has been shown to reduce high blood pressure (hypertension). It may also reduce your risk for type 2 diabetes, heart disease, and stroke. The DASH eating plan may also help with weight loss. What are tips for following this plan? General guidelines  Avoid eating more than 2,300 mg (milligrams) of salt (sodium) a   day. If you have hypertension, you may need to reduce your sodium intake to 1,500 mg a day.  Limit alcohol intake to no more than 1 drink a day for nonpregnant women and 2 drinks a day for men. One drink equals 12 oz of beer, 5 oz of wine, or 1 oz of hard liquor.  Work with your health care provider to maintain a healthy body weight or to lose weight. Ask what an ideal weight is for you.  Get at least 30 minutes of exercise that causes your heart to beat faster (aerobic exercise) most days of the week. Activities may include walking, swimming, or  biking.  Work with your health care provider or diet and nutrition specialist (dietitian) to adjust your eating plan to your individual calorie needs. Reading food labels  Check food labels for the amount of sodium per serving. Choose foods with less than 5 percent of the Daily Value of sodium. Generally, foods with less than 300 mg of sodium per serving fit into this eating plan.  To find whole grains, look for the word "whole" as the first word in the ingredient list. Shopping  Buy products labeled as "low-sodium" or "no salt added."  Buy fresh foods. Avoid canned foods and premade or frozen meals. Cooking  Avoid adding salt when cooking. Use salt-free seasonings or herbs instead of table salt or sea salt. Check with your health care provider or pharmacist before using salt substitutes.  Do not fry foods. Cook foods using healthy methods such as baking, boiling, grilling, and broiling instead.  Cook with heart-healthy oils, such as olive, canola, soybean, or sunflower oil. Meal planning   Eat a balanced diet that includes: ? 5 or more servings of fruits and vegetables each day. At each meal, try to fill half of your plate with fruits and vegetables. ? Up to 6-8 servings of whole grains each day. ? Less than 6 oz of lean meat, poultry, or fish each day. A 3-oz serving of meat is about the same size as a deck of cards. One egg equals 1 oz. ? 2 servings of low-fat dairy each day. ? A serving of nuts, seeds, or beans 5 times each week. ? Heart-healthy fats. Healthy fats called Omega-3 fatty acids are found in foods such as flaxseeds and coldwater fish, like sardines, salmon, and mackerel.  Limit how much you eat of the following: ? Canned or prepackaged foods. ? Food that is high in trans fat, such as fried foods. ? Food that is high in saturated fat, such as fatty meat. ? Sweets, desserts, sugary drinks, and other foods with added sugar. ? Full-fat dairy products.  Do not salt  foods before eating.  Try to eat at least 2 vegetarian meals each week.  Eat more home-cooked food and less restaurant, buffet, and fast food.  When eating at a restaurant, ask that your food be prepared with less salt or no salt, if possible. What foods are recommended? The items listed may not be a complete list. Talk with your dietitian about what dietary choices are best for you. Grains Whole-grain or whole-wheat bread. Whole-grain or whole-wheat pasta. Brown rice. Oatmeal. Quinoa. Bulgur. Whole-grain and low-sodium cereals. Pita bread. Low-fat, low-sodium crackers. Whole-wheat flour tortillas. Vegetables Fresh or frozen vegetables (raw, steamed, roasted, or grilled). Low-sodium or reduced-sodium tomato and vegetable juice. Low-sodium or reduced-sodium tomato sauce and tomato paste. Low-sodium or reduced-sodium canned vegetables. Fruits All fresh, dried, or frozen fruit. Canned fruit in natural juice (  without added sugar). Meat and other protein foods Skinless chicken or turkey. Ground chicken or turkey. Pork with fat trimmed off. Fish and seafood. Egg whites. Dried beans, peas, or lentils. Unsalted nuts, nut butters, and seeds. Unsalted canned beans. Lean cuts of beef with fat trimmed off. Low-sodium, lean deli meat. Dairy Low-fat (1%) or fat-free (skim) milk. Fat-free, low-fat, or reduced-fat cheeses. Nonfat, low-sodium ricotta or cottage cheese. Low-fat or nonfat yogurt. Low-fat, low-sodium cheese. Fats and oils Soft margarine without trans fats. Vegetable oil. Low-fat, reduced-fat, or light mayonnaise and salad dressings (reduced-sodium). Canola, safflower, olive, soybean, and sunflower oils. Avocado. Seasoning and other foods Herbs. Spices. Seasoning mixes without salt. Unsalted popcorn and pretzels. Fat-free sweets. What foods are not recommended? The items listed may not be a complete list. Talk with your dietitian about what dietary choices are best for you. Grains Baked goods  made with fat, such as croissants, muffins, or some breads. Dry pasta or rice meal packs. Vegetables Creamed or fried vegetables. Vegetables in a cheese sauce. Regular canned vegetables (not low-sodium or reduced-sodium). Regular canned tomato sauce and paste (not low-sodium or reduced-sodium). Regular tomato and vegetable juice (not low-sodium or reduced-sodium). Pickles. Olives. Fruits Canned fruit in a light or heavy syrup. Fried fruit. Fruit in cream or butter sauce. Meat and other protein foods Fatty cuts of meat. Ribs. Fried meat. Bacon. Sausage. Bologna and other processed lunch meats. Salami. Fatback. Hotdogs. Bratwurst. Salted nuts and seeds. Canned beans with added salt. Canned or smoked fish. Whole eggs or egg yolks. Chicken or turkey with skin. Dairy Whole or 2% milk, cream, and half-and-half. Whole or full-fat cream cheese. Whole-fat or sweetened yogurt. Full-fat cheese. Nondairy creamers. Whipped toppings. Processed cheese and cheese spreads. Fats and oils Butter. Stick margarine. Lard. Shortening. Ghee. Bacon fat. Tropical oils, such as coconut, palm kernel, or palm oil. Seasoning and other foods Salted popcorn and pretzels. Onion salt, garlic salt, seasoned salt, table salt, and sea salt. Worcestershire sauce. Tartar sauce. Barbecue sauce. Teriyaki sauce. Soy sauce, including reduced-sodium. Steak sauce. Canned and packaged gravies. Fish sauce. Oyster sauce. Cocktail sauce. Horseradish that you find on the shelf. Ketchup. Mustard. Meat flavorings and tenderizers. Bouillon cubes. Hot sauce and Tabasco sauce. Premade or packaged marinades. Premade or packaged taco seasonings. Relishes. Regular salad dressings. Where to find more information:  National Heart, Lung, and Blood Institute: www.nhlbi.nih.gov  American Heart Association: www.heart.org Summary  The DASH eating plan is a healthy eating plan that has been shown to reduce high blood pressure (hypertension). It may also reduce  your risk for type 2 diabetes, heart disease, and stroke.  With the DASH eating plan, you should limit salt (sodium) intake to 2,300 mg a day. If you have hypertension, you may need to reduce your sodium intake to 1,500 mg a day.  When on the DASH eating plan, aim to eat more fresh fruits and vegetables, whole grains, lean proteins, low-fat dairy, and heart-healthy fats.  Work with your health care provider or diet and nutrition specialist (dietitian) to adjust your eating plan to your individual calorie needs. This information is not intended to replace advice given to you by your health care provider. Make sure you discuss any questions you have with your health care provider. Document Released: 02/10/2011 Document Revised: 02/15/2016 Document Reviewed: 02/15/2016 Elsevier Interactive Patient Education  2018 Elsevier Inc.   

## 2018-01-23 ENCOUNTER — Encounter: Payer: Self-pay | Admitting: Family Medicine

## 2018-01-24 ENCOUNTER — Encounter: Payer: Self-pay | Admitting: Internal Medicine

## 2018-02-06 ENCOUNTER — Telehealth: Payer: Self-pay | Admitting: Internal Medicine

## 2018-02-06 ENCOUNTER — Other Ambulatory Visit: Payer: Self-pay | Admitting: Family Medicine

## 2018-02-06 NOTE — Telephone Encounter (Signed)
PATIENT WANTS TO KNOW IF HE IS SUPPOSED TO HAVE A ULTRASOUND BEFORE HIS OFFICE VISIT.  [phone]

## 2018-02-07 NOTE — Telephone Encounter (Signed)
EG can you please advise on this. Pt has an upcoming apt at 04/18/18 and a letter was sent to pt to update TCS.

## 2018-02-08 ENCOUNTER — Other Ambulatory Visit: Payer: Self-pay | Admitting: Family Medicine

## 2018-02-08 NOTE — Telephone Encounter (Signed)
May have this +1 refill 

## 2018-02-08 NOTE — Telephone Encounter (Signed)
Medication not on pt list. At last visit when pt came in he said he was taking one in the am and two in the pm. Its been removed. I called the pt and asked if he was on this he says he does not know. He says he will be losing insurance in January and he went on his pharmacy web site and re orderd everything that was on his list. He says he will have to let his wife call us with what he is taking.

## 2018-02-09 NOTE — Telephone Encounter (Signed)
The metformin 500 mg is no longer on his list. When he was last seen he had reported being on one in am and two in pm. I did not see that it was stopped in your note. I spoke with the pt he says he is on this as reported above. His medication list states he is taking Janumet.Please advise.

## 2018-02-09 NOTE — Telephone Encounter (Signed)
Unfortunately I do not believe that this patient quite understands what we did  On his last visit we started Janumet 1 tablet twice daily Janumet is a combination of metformin along with Januvia We started this because he was on metformin only and his A1c looked bad  Therefore he should only be on Janumet which already has metformin He does not need to stay on regular Metformin Therefore that needs to be left off of his med list  Stick with Janumet twice daily if patient is not understanding please try to educate him regarding his medicine list

## 2018-02-09 NOTE — Telephone Encounter (Signed)
Pt called back to make nurse aware he is taking metformin 500 mg twice a day. One in the AM and two in the PM

## 2018-02-09 NOTE — Telephone Encounter (Signed)
Is he having any overt symptoms or is it just time for routine screening/surveilnce?  If it's just routine we can schedule it at his OV in Feb (please update feb CC to add "schedule TCS")

## 2018-02-12 NOTE — Telephone Encounter (Signed)
Discussed with pt to only take janumet. Stop plain metformin. Explained to him that janumet has metformin and januvia in it so he should not be taking plain metformin also. He states he will stop plain metformin today and just take janumet. Note sent to pharm to d/c metformin.

## 2018-02-12 NOTE — Telephone Encounter (Signed)
Lmom, waiting on a return call.  

## 2018-02-13 NOTE — Telephone Encounter (Signed)
Pt isn't having symptoms and will wait to update procedure at his 04/18/18 apt. Noted changes in comments per EG.

## 2018-02-15 ENCOUNTER — Other Ambulatory Visit: Payer: Self-pay | Admitting: Family Medicine

## 2018-03-05 ENCOUNTER — Other Ambulatory Visit: Payer: Self-pay | Admitting: Family Medicine

## 2018-03-19 ENCOUNTER — Telehealth: Payer: Self-pay | Admitting: Family Medicine

## 2018-03-19 NOTE — Telephone Encounter (Signed)
Fax from pharmacy stating that if form is signed patient can receive diabetic glucose meter at no charge. Please advise. Form in provider office.

## 2018-03-20 ENCOUNTER — Other Ambulatory Visit: Payer: Self-pay | Admitting: *Deleted

## 2018-03-20 MED ORDER — GLUCOSE BLOOD VI STRP: ORAL_STRIP | 1 refills | Status: DC

## 2018-03-21 NOTE — Telephone Encounter (Signed)
Form faxed to pharmacy

## 2018-03-21 NOTE — Telephone Encounter (Signed)
This form was filled out please assist thank you

## 2018-04-11 ENCOUNTER — Other Ambulatory Visit: Payer: Self-pay | Admitting: Family Medicine

## 2018-04-18 ENCOUNTER — Telehealth: Payer: Self-pay | Admitting: Nurse Practitioner

## 2018-04-18 ENCOUNTER — Encounter: Payer: Self-pay | Admitting: Internal Medicine

## 2018-04-18 ENCOUNTER — Ambulatory Visit: Payer: BLUE CROSS/BLUE SHIELD | Admitting: Nurse Practitioner

## 2018-04-18 NOTE — Telephone Encounter (Signed)
Patient was a no show and letter sent  °

## 2018-05-16 ENCOUNTER — Other Ambulatory Visit: Payer: Self-pay | Admitting: Family Medicine

## 2018-06-13 ENCOUNTER — Other Ambulatory Visit: Payer: Self-pay | Admitting: Family Medicine

## 2018-06-14 NOTE — Telephone Encounter (Signed)
May have this +1 refill 

## 2018-06-18 ENCOUNTER — Other Ambulatory Visit: Payer: Self-pay

## 2018-06-18 MED ORDER — PANTOPRAZOLE SODIUM 40 MG PO TBEC: 40.0000 mg | DELAYED_RELEASE_TABLET | Freq: Every day | ORAL | 1 refills | Status: DC

## 2018-06-18 MED ORDER — AMLODIPINE BESYLATE 10 MG PO TABS: 10.0000 mg | ORAL_TABLET | Freq: Every day | ORAL | 1 refills | Status: DC

## 2018-07-18 ENCOUNTER — Ambulatory Visit: Payer: BLUE CROSS/BLUE SHIELD | Admitting: Family Medicine

## 2018-07-27 ENCOUNTER — Other Ambulatory Visit: Payer: Self-pay | Admitting: Family Medicine

## 2018-07-30 NOTE — Telephone Encounter (Signed)
Schedule follow-up for this summer may have 1 refill

## 2018-08-07 ENCOUNTER — Other Ambulatory Visit: Payer: Self-pay | Admitting: Family Medicine

## 2018-08-07 NOTE — Telephone Encounter (Signed)
May have 30-day needs follow-up visit

## 2018-09-06 ENCOUNTER — Other Ambulatory Visit: Payer: Self-pay | Admitting: Family Medicine

## 2018-09-06 NOTE — Telephone Encounter (Signed)
Left msg to return call to schedule a virtual visit for refills. Patient no showed last visit for med refills.

## 2018-09-06 NOTE — Telephone Encounter (Signed)
Please schedule virtual visit and then send back to nurses so we can send in refill.

## 2018-09-06 NOTE — Telephone Encounter (Signed)
Schedule virtual visit May have 1 refill needs virtual visit this summer

## 2018-09-07 ENCOUNTER — Other Ambulatory Visit: Payer: Self-pay | Admitting: Family Medicine

## 2018-09-10 NOTE — Telephone Encounter (Signed)
Schedule office visit virtual is fine or office if patient does not want to do virtual [facility] so 1 refill

## 2018-09-11 NOTE — Telephone Encounter (Signed)
Please schedule visit and then route to nurses. Thank you

## 2018-09-12 NOTE — Telephone Encounter (Signed)
We have called several times trying to get in touch with patient to schedule a medication check but hasnt returned any of our calls . Please advise

## 2018-09-19 ENCOUNTER — Other Ambulatory Visit: Payer: Self-pay | Admitting: Family Medicine

## 2018-09-19 NOTE — Telephone Encounter (Signed)
This patient needs an office visit I recommend to deny the medication The medication have patient contact us directly

## 2018-09-21 NOTE — Telephone Encounter (Signed)
APPT MADE FOR 10/03/18

## 2018-09-24 ENCOUNTER — Other Ambulatory Visit: Payer: Self-pay | Admitting: Family Medicine

## 2018-10-03 ENCOUNTER — Other Ambulatory Visit: Payer: Self-pay

## 2018-10-03 ENCOUNTER — Encounter: Payer: BLUE CROSS/BLUE SHIELD | Admitting: Family Medicine

## 2018-10-03 NOTE — Progress Notes (Signed)
   Subjective:    Patient ID: Shawn [name]., male    DOB: Sep 04, 1955, 63 y.o.   MRN: [id number]  Hypertension      Review of Systems     Objective:   Physical Exam        Assessment & Plan:

## 2018-10-08 ENCOUNTER — Other Ambulatory Visit: Payer: Self-pay

## 2018-10-08 ENCOUNTER — Ambulatory Visit: Payer: BLUE CROSS/BLUE SHIELD | Admitting: Family Medicine

## 2018-10-10 ENCOUNTER — Other Ambulatory Visit: Payer: Self-pay | Admitting: Family Medicine

## 2018-10-12 ENCOUNTER — Other Ambulatory Visit: Payer: Self-pay | Admitting: Family Medicine

## 2018-10-13 ENCOUNTER — Other Ambulatory Visit: Payer: Self-pay | Admitting: Family Medicine

## 2018-10-14 NOTE — Telephone Encounter (Signed)
May have 30-day supply of each medicine must keep follow-up visit

## 2018-10-22 ENCOUNTER — Encounter: Payer: BLUE CROSS/BLUE SHIELD | Admitting: Family Medicine

## 2018-10-22 ENCOUNTER — Other Ambulatory Visit: Payer: Self-pay

## 2018-10-22 NOTE — Progress Notes (Signed)
   Subjective:    Patient ID: Shawn Greaves., male    DOB: August 05, 1955, 63 y.o.   MRN: [id number]  Hypertension  Diabetes      Review of Systems     Objective:   Physical Exam        Assessment & Plan:

## 2018-10-24 ENCOUNTER — Other Ambulatory Visit: Payer: Self-pay | Admitting: *Deleted

## 2018-10-24 MED ORDER — LISINOPRIL 40 MG PO TABS: ORAL_TABLET | ORAL | 0 refills | Status: DC

## 2018-10-24 MED ORDER — METOPROLOL SUCCINATE ER 50 MG PO TB24: ORAL_TABLET | ORAL | 0 refills | Status: DC

## 2018-10-30 ENCOUNTER — Other Ambulatory Visit: Payer: Self-pay | Admitting: Family Medicine

## 2018-10-30 NOTE — Telephone Encounter (Signed)
Please refuse the prescription patient needs to do a follow-up office visit or virtual visit

## 2018-11-01 ENCOUNTER — Other Ambulatory Visit: Payer: Self-pay | Admitting: Family Medicine

## 2018-11-03 NOTE — Progress Notes (Signed)
This encounter was created in error - please disregard.

## 2018-11-09 ENCOUNTER — Other Ambulatory Visit: Payer: Self-pay | Admitting: Family Medicine

## 2018-11-14 ENCOUNTER — Other Ambulatory Visit: Payer: Self-pay | Admitting: Family Medicine

## 2018-11-19 ENCOUNTER — Ambulatory Visit (INDEPENDENT_AMBULATORY_CARE_PROVIDER_SITE_OTHER): Payer: PPO | Admitting: Family Medicine

## 2018-11-19 DIAGNOSIS — K219 Gastro-esophageal reflux disease without esophagitis: Secondary | ICD-10-CM

## 2018-11-19 DIAGNOSIS — E1165 Type 2 diabetes mellitus with hyperglycemia: Secondary | ICD-10-CM

## 2018-11-19 DIAGNOSIS — I10 Essential (primary) hypertension: Secondary | ICD-10-CM | POA: Diagnosis not present

## 2018-11-19 DIAGNOSIS — D696 Thrombocytopenia, unspecified: Secondary | ICD-10-CM | POA: Diagnosis not present

## 2018-11-19 DIAGNOSIS — G4733 Obstructive sleep apnea (adult) (pediatric): Secondary | ICD-10-CM

## 2018-11-19 DIAGNOSIS — E7849 Other hyperlipidemia: Secondary | ICD-10-CM

## 2018-11-19 DIAGNOSIS — K7460 Unspecified cirrhosis of liver: Secondary | ICD-10-CM

## 2018-11-19 DIAGNOSIS — Z125 Encounter for screening for malignant neoplasm of prostate: Secondary | ICD-10-CM | POA: Diagnosis not present

## 2018-11-19 MED ORDER — JANUMET 50-500 MG PO TABS: ORAL_TABLET | ORAL | 1 refills | Status: DC

## 2018-11-19 MED ORDER — DIAZEPAM 10 MG PO TABS: ORAL_TABLET | ORAL | 5 refills | Status: DC

## 2018-11-19 MED ORDER — PANTOPRAZOLE SODIUM 40 MG PO TBEC: 40.0000 mg | DELAYED_RELEASE_TABLET | Freq: Every day | ORAL | 1 refills | Status: DC

## 2018-11-19 MED ORDER — POTASSIUM CHLORIDE CRYS ER 20 MEQ PO TBCR: EXTENDED_RELEASE_TABLET | ORAL | 1 refills | Status: DC

## 2018-11-19 NOTE — Progress Notes (Addendum)
Subjective:    Patient ID: Shawn [name]., male    DOB: [birth year], 63 y.o.   MRN: [id number]  Hypertension This is a chronic problem. The current episode started more than 1 year ago. Pertinent negatives include no chest pain, headaches or shortness of breath. Risk factors for coronary artery disease include dyslipidemia, diabetes mellitus and male gender. Treatments tried: lisinopril, metoprolol, norvasc.  The patient does utilize his CPAP.  It does help him.  He states he feels significantly different if he does not use it.  He would like to continue using this. Patient states he needs new mask for CPAP and his stomach is acting up on him again Patient has history of cirrhosis trying to the best can keep weight under control.  Denies any bleeding issues or vomiting or any severe abdominal discomfort just at times it feels like it acts up on he does take his PPI and states reflux under good control  History hyperlipidemia watch his diet takes medication needs refills will need lab work  History diabetes watches his diet stays active states his sugars have been running okay he is willing to do lab work does take his medicines on a regular basis  Underlying anxiety with some intermittent depression states depression under good control but anxiety he uses Valium once or twice a day when necessary. Virtual Visit via Video Note  I connected with Shawn [name]. on 11/19/18 at  9:30 AM EDT by a video enabled telemedicine application and verified that I am speaking with the correct person using two identifiers.  Location: Patient: home Provider: office   I discussed the limitations of evaluation and management by telemedicine and the availability of in person appointments. The patient expressed understanding and agreed to proceed.  History of Present Illness:    Observations/Objective:   Assessment and Plan:   Follow Up Instructions:    I discussed the assessment and  treatment plan with the patient. The patient was provided an opportunity to ask questions and all were answered. The patient agreed with the plan and demonstrated an understanding of the instructions.   The patient was advised to call back or seek an in-person evaluation if the symptoms worsen or if the condition fails to improve as anticipated.  I provided 25 minutes of non-face-to-face time during this encounter.      Review of Systems  Constitutional: Negative for diaphoresis and fatigue.  HENT: Negative for congestion and rhinorrhea.   Respiratory: Negative for cough and shortness of breath.   Cardiovascular: Negative for chest pain and leg swelling.  Gastrointestinal: Negative for abdominal pain and diarrhea.  Skin: Negative for color change and rash.  Neurological: Negative for dizziness and headaches.  Psychiatric/Behavioral: Negative for behavioral problems and confusion.       Objective:   Physical Exam  Today's visit was via telephone Physical exam was not possible for this visit      Assessment & Plan:  1. Essential hypertension, benign He states blood pressure been under good control watching his diet trying to do a good job minimizing salt taking his medicine - Lipid panel - Hepatic function panel - CBC with Differential/Platelet - Hemoglobin A1c - Microalbumin / creatinine urine ratio - PSA - Basic metabolic panel  2. Obstructive sleep apnea He states sleep apnea doing all right.  He does need a prescription for new meds - Lipid panel - Hepatic function panel - CBC with Differential/Platelet - Hemoglobin A1c - Microalbumin / creatinine  urine ratio - PSA - Basic metabolic panel  3. Cirrhosis, non-alcoholic ([place]) He does do the best he can keeping his weight under control denies any bleeding issues we will do lab work - Lipid panel - Hepatic function panel - CBC with Differential/Platelet - Hemoglobin A1c - Microalbumin / creatinine urine ratio -  PSA - Basic metabolic panel  4. Gastroesophageal reflux disease without esophagitis GI apparently doing okay but very important for the patient to take his medicine if ongoing troubles do an office visit - Lipid panel - Hepatic function panel - CBC with Differential/Platelet - Hemoglobin A1c - Microalbumin / creatinine urine ratio - PSA - Basic metabolic panel  5. Uncontrolled type 2 diabetes mellitus with hyperglycemia ([place]) Diabetes he is uncertain what is doing but he thinks her sugars are okay we will check lab work.  In the past it is not been under good control - Lipid panel - Hepatic function panel - CBC with Differential/Platelet - Hemoglobin A1c - Microalbumin / creatinine urine ratio - PSA - Basic metabolic panel  6. Other hyperlipidemia Hyperlipidemia watch diet stay active take medicine follow-up on lab work - Lipid panel - Hepatic function panel - CBC with Differential/Platelet - Hemoglobin A1c - Microalbumin / creatinine urine ratio - PSA - Basic metabolic panel  7. Thrombocytopenia ([place]) History of thrombocytopenia recheck lab work denies any bleeding issues probably related to his cirrhosis - Lipid panel - Hepatic function panel - CBC with Differential/Platelet - Hemoglobin A1c - Microalbumin / creatinine urine ratio - PSA - Basic metabolic panel  8. Screening for prostate cancer Screening for prostate cancer - PSA   25 minutes was spent with the patient.  This statement verifies that 25 minutes was indeed spent with the patient.  More than 50% of this visit-total duration of the visit-was spent in counseling and coordination of care. The issues that the patient came in for today as reflected in the diagnosis (s) please refer to documentation for further details.  Sleep apnea-very important for the patient to continue using this on a regular basis.  We can help get him the equipment he needs.

## 2018-11-22 ENCOUNTER — Telehealth: Payer: Self-pay | Admitting: Family Medicine

## 2018-11-22 NOTE — Telephone Encounter (Signed)
Pt has virtual visit 11/19/2018 please do addendum to specifically state that the patient does use & benefit from use of his CPAP - this is documentation requirements that the insurance requires  Please forward to [place] when done to be sent to [facility]

## 2018-11-23 ENCOUNTER — Other Ambulatory Visit: Payer: Self-pay | Admitting: Family Medicine

## 2018-11-23 NOTE — Telephone Encounter (Signed)
CPAP addendum was completed as requested thank you

## 2018-11-23 NOTE — Telephone Encounter (Signed)
Faxed note to [facility] w/[facility]

## 2018-11-26 ENCOUNTER — Other Ambulatory Visit: Payer: Self-pay | Admitting: Family Medicine

## 2018-11-28 DIAGNOSIS — G4733 Obstructive sleep apnea (adult) (pediatric): Secondary | ICD-10-CM | POA: Diagnosis not present

## 2018-11-29 ENCOUNTER — Telehealth: Payer: Self-pay | Admitting: Family Medicine

## 2018-11-29 DIAGNOSIS — R7989 Other specified abnormal findings of blood chemistry: Secondary | ICD-10-CM

## 2018-11-29 DIAGNOSIS — R935 Abnormal findings on diagnostic imaging of other abdominal regions, including retroperitoneum: Secondary | ICD-10-CM

## 2018-11-29 NOTE — Telephone Encounter (Signed)
Pt in reminder file for Aortic Ultrasound. Pt last seen 11/19/2018 for HTN. Please advise. Thank you

## 2018-11-30 NOTE — Telephone Encounter (Signed)
Please explained to the patient that back in 2016 when he had ultrasound of the abdomen it did show some slight irregularity within the aorta and the radiologist recommended following up again several years later-5 years later- I recommend that we go ahead with this test at this time   Diagnosis abdominal aorta ectasia  I recommend setting the patient up for a ultrasound of the abdominal aorta Thank you

## 2018-11-30 NOTE — Telephone Encounter (Signed)
[facility]

## 2018-11-30 NOTE — Addendum Note (Signed)
Addended by: Vicente Males on: 11/30/2018 05:32 PM   Modules accepted: Orders

## 2018-12-05 ENCOUNTER — Other Ambulatory Visit: Payer: Self-pay | Admitting: *Deleted

## 2018-12-05 DIAGNOSIS — I77819 Aortic ectasia, unspecified site: Secondary | ICD-10-CM

## 2018-12-05 NOTE — Telephone Encounter (Signed)
Discussed with pt and he states he can go anyday before lunch time. Order put in for it to be precerted and scheduled.

## 2018-12-05 NOTE — Telephone Encounter (Signed)
Left message to return call 

## 2018-12-12 DIAGNOSIS — I10 Essential (primary) hypertension: Secondary | ICD-10-CM | POA: Diagnosis not present

## 2018-12-12 DIAGNOSIS — Z125 Encounter for screening for malignant neoplasm of prostate: Secondary | ICD-10-CM | POA: Diagnosis not present

## 2018-12-12 DIAGNOSIS — E1165 Type 2 diabetes mellitus with hyperglycemia: Secondary | ICD-10-CM | POA: Diagnosis not present

## 2018-12-12 DIAGNOSIS — K7460 Unspecified cirrhosis of liver: Secondary | ICD-10-CM | POA: Diagnosis not present

## 2018-12-12 DIAGNOSIS — G4733 Obstructive sleep apnea (adult) (pediatric): Secondary | ICD-10-CM | POA: Diagnosis not present

## 2018-12-12 DIAGNOSIS — K219 Gastro-esophageal reflux disease without esophagitis: Secondary | ICD-10-CM | POA: Diagnosis not present

## 2018-12-12 DIAGNOSIS — D696 Thrombocytopenia, unspecified: Secondary | ICD-10-CM | POA: Diagnosis not present

## 2018-12-12 DIAGNOSIS — E7849 Other hyperlipidemia: Secondary | ICD-10-CM | POA: Diagnosis not present

## 2018-12-13 LAB — CBC WITH DIFFERENTIAL/PLATELET
Basophils Absolute: 0.1 10*3/uL (ref 0.0–0.2)
Basos: 1 %
EOS (ABSOLUTE): 0.2 10*3/uL (ref 0.0–0.4)
Eos: 4 %
Hematocrit: 47.7 % (ref 37.5–51.0)
Hemoglobin: 16.6 g/dL (ref 13.0–17.7)
Immature Grans (Abs): 0 10*3/uL (ref 0.0–0.1)
Immature Granulocytes: 0 %
Lymphocytes Absolute: 1.6 10*3/uL (ref 0.7–3.1)
Lymphs: 30 %
MCH: 31.4 pg (ref 26.6–33.0)
MCHC: 34.8 g/dL (ref 31.5–35.7)
MCV: 90 fL (ref 79–97)
Monocytes Absolute: 0.5 10*3/uL (ref 0.1–0.9)
Monocytes: 10 %
Neutrophils Absolute: 3 10*3/uL (ref 1.4–7.0)
Neutrophils: 55 %
Platelets: 125 10*3/uL — ABNORMAL LOW (ref 150–450)
RBC: 5.28 x10E6/uL (ref 4.14–5.80)
RDW: 14.1 % (ref 11.6–15.4)
WBC: 5.5 10*3/uL (ref 3.4–10.8)

## 2018-12-13 LAB — LIPID PANEL
Chol/HDL Ratio: 6.3 ratio — ABNORMAL HIGH (ref 0.0–5.0)
Cholesterol, Total: 171 mg/dL (ref 100–199)
HDL: 27 mg/dL — ABNORMAL LOW (ref >39)
LDL Chol Calc (NIH): 95 mg/dL (ref 0–99)
Triglycerides: 287 mg/dL — ABNORMAL HIGH (ref 0–149)
VLDL Cholesterol Cal: 49 mg/dL — ABNORMAL HIGH (ref 5–40)

## 2018-12-13 LAB — BASIC METABOLIC PANEL
BUN/Creatinine Ratio: 11 (ref 10–24)
BUN: 9 mg/dL (ref 8–27)
CO2: 26 mmol/L (ref 20–29)
Calcium: 9.1 mg/dL (ref 8.6–10.2)
Chloride: 100 mmol/L (ref 96–106)
Creatinine, Ser: 0.79 mg/dL (ref 0.76–1.27)
GFR calc Af Amer: 112 mL/min/{1.73_m2} (ref >59)
GFR calc non Af Amer: 97 mL/min/{1.73_m2} (ref >59)
Glucose: 306 mg/dL — ABNORMAL HIGH (ref 65–99)
Potassium: 3.1 mmol/L — ABNORMAL LOW (ref 3.5–5.2)
Sodium: 139 mmol/L (ref 134–144)

## 2018-12-13 LAB — MICROALBUMIN / CREATININE URINE RATIO
Creatinine, Urine: 100.1 mg/dL
Microalb/Creat Ratio: 180 mg/g creat — ABNORMAL HIGH (ref 0–29)
Microalbumin, Urine: 180.5 ug/mL

## 2018-12-13 LAB — HEMOGLOBIN A1C
Est. average glucose Bld gHb Est-mCnc: 235 mg/dL
Hgb A1c MFr Bld: 9.8 % — ABNORMAL HIGH (ref 4.8–5.6)

## 2018-12-13 LAB — HEPATIC FUNCTION PANEL
ALT: 43 IU/L (ref 0–44)
AST: 39 IU/L (ref 0–40)
Albumin: 3.9 g/dL (ref 3.8–4.8)
Alkaline Phosphatase: 150 IU/L — ABNORMAL HIGH (ref 39–117)
Bilirubin Total: 1 mg/dL (ref 0.0–1.2)
Bilirubin, Direct: 0.34 mg/dL (ref 0.00–0.40)
Total Protein: 6.7 g/dL (ref 6.0–8.5)

## 2018-12-13 LAB — PSA: Prostate Specific Ag, Serum: 0.5 ng/mL (ref 0.0–4.0)

## 2018-12-20 ENCOUNTER — Other Ambulatory Visit: Payer: Self-pay | Admitting: Family Medicine

## 2018-12-24 ENCOUNTER — Telehealth: Payer: Self-pay | Admitting: Family Medicine

## 2018-12-24 ENCOUNTER — Other Ambulatory Visit: Payer: Self-pay | Admitting: *Deleted

## 2018-12-24 MED ORDER — VALACYCLOVIR HCL 1 G PO TABS: ORAL_TABLET | ORAL | 5 refills | Status: DC

## 2018-12-24 NOTE — Telephone Encounter (Signed)
Patient is requesting prescription for valtrex for fever blister. [facility]

## 2018-12-24 NOTE — Telephone Encounter (Signed)
Med sent to pharm. Pt notified.  

## 2018-12-24 NOTE — Telephone Encounter (Signed)
Valtrex 1000 mg Take 2 in the morning and then 2 again in the evening #4 5 refills

## 2018-12-25 ENCOUNTER — Other Ambulatory Visit: Payer: Self-pay

## 2018-12-25 ENCOUNTER — Other Ambulatory Visit: Payer: Self-pay | Admitting: Family Medicine

## 2018-12-25 MED ORDER — ONETOUCH VERIO VI STRP: ORAL_STRIP | 0 refills | Status: DC

## 2018-12-31 ENCOUNTER — Other Ambulatory Visit: Payer: Self-pay

## 2018-12-31 MED ORDER — HYDROCHLOROTHIAZIDE 12.5 MG PO TABS: 12.5000 mg | ORAL_TABLET | Freq: Every day | ORAL | 1 refills | Status: DC

## 2019-01-01 ENCOUNTER — Other Ambulatory Visit: Payer: Self-pay | Admitting: *Deleted

## 2019-01-01 ENCOUNTER — Ambulatory Visit (INDEPENDENT_AMBULATORY_CARE_PROVIDER_SITE_OTHER): Payer: PPO | Admitting: Family Medicine

## 2019-01-01 DIAGNOSIS — E1165 Type 2 diabetes mellitus with hyperglycemia: Secondary | ICD-10-CM

## 2019-01-01 DIAGNOSIS — K7460 Unspecified cirrhosis of liver: Secondary | ICD-10-CM | POA: Diagnosis not present

## 2019-01-01 DIAGNOSIS — F329 Major depressive disorder, single episode, unspecified: Secondary | ICD-10-CM

## 2019-01-01 DIAGNOSIS — I10 Essential (primary) hypertension: Secondary | ICD-10-CM | POA: Diagnosis not present

## 2019-01-01 DIAGNOSIS — K760 Fatty (change of) liver, not elsewhere classified: Secondary | ICD-10-CM | POA: Diagnosis not present

## 2019-01-01 DIAGNOSIS — F32A Depression, unspecified: Secondary | ICD-10-CM

## 2019-01-01 DIAGNOSIS — R7989 Other specified abnormal findings of blood chemistry: Secondary | ICD-10-CM | POA: Diagnosis not present

## 2019-01-01 MED ORDER — JANUMET 50-1000 MG PO TABS: 1.0000 | ORAL_TABLET | Freq: Two times a day (BID) | ORAL | 5 refills | Status: DC

## 2019-01-01 NOTE — Progress Notes (Signed)
   Subjective:    Patient ID: Shawn [name]., male    DOB: November 17, 1955, 63 y.o.   MRN: [id number]  HPIFollow up on bloodwork results. Pt also states he was suppose to have a liver US set up at last visit and he never heard back about the appointment.  Patient had recent lab work.  Diabetes shows poor control Cholesterol overall okay liver enzymes look good kidney functions look good patient relates he is continuing his medication He does have cirrhosis issues last ultrasound 2018 he is due he denies drinking.  Tries watch diet tries to stay active.  PMH benign Virtual Visit via Telephone Note  I connected with Shawn [name]. on 01/01/19 at 11:00 AM EDT by telephone and verified that I am speaking with the correct person using two identifiers.  Location: Patient: home Provider: office   I discussed the limitations, risks, security and privacy concerns of performing an evaluation and management service by telephone and the availability of in person appointments. I also discussed with the patient that there may be a patient responsible charge related to this service. The patient expressed understanding and agreed to proceed.   History of Present Illness:    Observations/Objective:   Assessment and Plan:   Follow Up Instructions:    I discussed the assessment and treatment plan with the patient. The patient was provided an opportunity to ask questions and all were answered. The patient agreed with the plan and demonstrated an understanding of the instructions.   The patient was advised to call back or seek an in-person evaluation if the symptoms worsen or if the condition fails to improve as anticipated.  I provided 15 minutes of non-face-to-face time during this encounter.       Review of Systems  Constitutional: Negative for diaphoresis and fatigue.  HENT: Negative for congestion and rhinorrhea.   Respiratory: Negative for cough and shortness of breath.    Cardiovascular: Negative for chest pain and leg swelling.  Gastrointestinal: Negative for abdominal pain and diarrhea.  Skin: Negative for color change and rash.  Neurological: Negative for dizziness and headaches.  Psychiatric/Behavioral: Negative for behavioral problems and confusion.       Objective:   Physical Exam   Today's visit was via telephone Physical exam was not possible for this visit      Assessment & Plan:  Diabetes subpar control bump up the dose of his medicine recheck labs again in 4 months Nonalcoholic cirrhosis Elastography of the liver and also ultrasound of the liver to rule out hepatoma Patient encouraged to watch diet try to lose weight

## 2019-01-01 NOTE — Addendum Note (Signed)
Addended by: Dairl Ponder on: 01/01/2019 02:07 PM   Modules accepted: Orders

## 2019-01-03 ENCOUNTER — Other Ambulatory Visit: Payer: Self-pay | Admitting: *Deleted

## 2019-01-03 DIAGNOSIS — I77819 Aortic ectasia, unspecified site: Secondary | ICD-10-CM

## 2019-01-04 ENCOUNTER — Encounter: Payer: Self-pay | Admitting: Family Medicine

## 2019-01-07 ENCOUNTER — Telehealth: Payer: Self-pay | Admitting: Family Medicine

## 2019-01-07 DIAGNOSIS — Z79899 Other long term (current) drug therapy: Secondary | ICD-10-CM

## 2019-01-07 DIAGNOSIS — I10 Essential (primary) hypertension: Secondary | ICD-10-CM

## 2019-01-07 NOTE — Telephone Encounter (Signed)
Janumet 50-1000 mg was sent in on 01/01/2019. Please advise. Thank you

## 2019-01-07 NOTE — Telephone Encounter (Signed)
Patient had appointment on 10/27 and prescribe a medication for his blood sugar and he states cant a afford it with his insurance still cost $400 dollars is there something cheaper [facility] [place]

## 2019-01-08 NOTE — Telephone Encounter (Signed)
Nurses please talk with patient Unfortunately newer medication for diabetes sometimes can have a high cost that is beyond our control  As for other options We can try Metformin by itself Along with generic glipizide To try to get his numbers under better control if he is interested  The glipizide would be twice a day as would the Metformin It would require him to check his sugars once daily sometimes in the morning sometimes in the evening then send [place] readings in a few weeks We would also want to do follow-up lab work in 3 months of the A1c with Met 7  If the patient is interested with this change then send me this message back and I will dictate out the change

## 2019-01-08 NOTE — Telephone Encounter (Signed)
First Call the patient's pharmacy Explained to the pharmacy that he cannot afford the Janumet Therefore we are going to try generic medicines but first Nurses please find out from the pharmacy when was the last time he got Metformin filled at the pharmacy or [place] filled? Essentially we are trying to find out was a patient getting these filled over the past several months before prescribing additional medicine thank you

## 2019-01-08 NOTE — Telephone Encounter (Signed)
[facility]; they last filled Janument on 10/10/2018 and Metformin in November 2019. Please advise. Thank you

## 2019-01-08 NOTE — Telephone Encounter (Signed)
Contacted patient. Pt states he is willing to try the Metformin and Glipizide. Please advise. Thank you  [facility] [place] [facility]

## 2019-01-09 MED ORDER — METFORMIN HCL 1000 MG PO TABS: ORAL_TABLET | ORAL | 3 refills | Status: DC

## 2019-01-09 MED ORDER — GLIPIZIDE ER 5 MG PO TB24: ORAL_TABLET | ORAL | 3 refills | Status: DC

## 2019-01-09 NOTE — Telephone Encounter (Signed)
I recommend Metformin 1000 mg 1 twice daily #60 with 3 refills  I also recommend glipizide extended release 5 mg 1 daily, #30 with 3 refills Discontinue Janumet  It is important for him to eat healthy and not skip meals It is also important to educate the patient that if he feels his sugars are low to eat a snack Low sugars can cause a person to feel sweaty shaky trembling  Also important for the patient to check his sugars daily On some days he should check it in the morning other days check it near supper or evening He should keep a written log and send it to [place] in 2 to 3 weeks  Patient do a met 7 4 weeks He should also follow-up with [place] in 3 months

## 2019-01-09 NOTE — Telephone Encounter (Signed)
Metformin and Glipizide sent to pharmacy. Lab order placed and mailed to patient as a reminder. Janumet discontinued. Left message to return call

## 2019-01-11 ENCOUNTER — Ambulatory Visit (HOSPITAL_COMMUNITY): Admission: RE | Admit: 2019-01-11 | Payer: PPO | Source: Ambulatory Visit

## 2019-01-11 NOTE — Telephone Encounter (Signed)
Discussed with pt and he verbalized understanding of all .

## 2019-01-11 NOTE — Telephone Encounter (Signed)
Left message to return call 

## 2019-01-17 ENCOUNTER — Telehealth: Payer: Self-pay | Admitting: *Deleted

## 2019-01-17 NOTE — Telephone Encounter (Signed)
Patient was scheduled for Repeat U/S AAA duplex (from tickler file) and U/S abdominal complete with elastography for 01/11/2019- Patient cancelled his appt and did not want to reschedule

## 2019-01-28 ENCOUNTER — Other Ambulatory Visit: Payer: Self-pay | Admitting: Family Medicine

## 2019-02-03 NOTE — Telephone Encounter (Signed)
Please go ahead and put this into the reminder file to be in April 2021 and we will revisit it with the patient at that time

## 2019-02-04 NOTE — Telephone Encounter (Signed)
Added to reminder file for April 2021

## 2019-03-06 DIAGNOSIS — Z79899 Other long term (current) drug therapy: Secondary | ICD-10-CM | POA: Diagnosis not present

## 2019-03-06 DIAGNOSIS — I10 Essential (primary) hypertension: Secondary | ICD-10-CM | POA: Diagnosis not present

## 2019-03-07 LAB — BASIC METABOLIC PANEL
BUN/Creatinine Ratio: 9 — ABNORMAL LOW (ref 10–24)
BUN: 7 mg/dL — ABNORMAL LOW (ref 8–27)
CO2: 26 mmol/L (ref 20–29)
Calcium: 9.3 mg/dL (ref 8.6–10.2)
Chloride: 103 mmol/L (ref 96–106)
Creatinine, Ser: 0.81 mg/dL (ref 0.76–1.27)
GFR calc Af Amer: 111 mL/min/{1.73_m2} (ref >59)
GFR calc non Af Amer: 96 mL/min/{1.73_m2} (ref >59)
Glucose: 111 mg/dL — ABNORMAL HIGH (ref 65–99)
Potassium: 3.2 mmol/L — ABNORMAL LOW (ref 3.5–5.2)
Sodium: 145 mmol/L — ABNORMAL HIGH (ref 134–144)

## 2019-03-12 ENCOUNTER — Other Ambulatory Visit: Payer: Self-pay | Admitting: Family Medicine

## 2019-03-12 ENCOUNTER — Other Ambulatory Visit: Payer: Self-pay

## 2019-03-12 DIAGNOSIS — I10 Essential (primary) hypertension: Secondary | ICD-10-CM

## 2019-03-12 MED ORDER — POTASSIUM CHLORIDE CRYS ER 20 MEQ PO TBCR: EXTENDED_RELEASE_TABLET | ORAL | 1 refills | Status: DC

## 2019-03-13 ENCOUNTER — Other Ambulatory Visit: Payer: Self-pay

## 2019-03-13 ENCOUNTER — Ambulatory Visit: Payer: PPO | Admitting: Family Medicine

## 2019-03-14 ENCOUNTER — Ambulatory Visit (HOSPITAL_COMMUNITY): Payer: PPO

## 2019-03-24 ENCOUNTER — Other Ambulatory Visit: Payer: Self-pay | Admitting: Family Medicine

## 2019-03-29 ENCOUNTER — Other Ambulatory Visit: Payer: Self-pay | Admitting: Family Medicine

## 2019-04-10 ENCOUNTER — Other Ambulatory Visit: Payer: Self-pay | Admitting: Family Medicine

## 2019-04-10 NOTE — Telephone Encounter (Signed)
lvm on home phone

## 2019-04-10 NOTE — Telephone Encounter (Signed)
vm box full unable to leave message

## 2019-04-10 NOTE — Telephone Encounter (Signed)
1 refill needs follow-up virtual visit this spring

## 2019-04-10 NOTE — Telephone Encounter (Signed)
Please schedule and then route back to nurses to send in refill °

## 2019-04-10 NOTE — Telephone Encounter (Signed)
01/01/19 was last med check

## 2019-04-14 ENCOUNTER — Other Ambulatory Visit: Payer: Self-pay | Admitting: Family Medicine

## 2019-04-15 NOTE — Telephone Encounter (Signed)
vm box full unable to leave vm on cell. No answer on home phone.

## 2019-04-15 NOTE — Telephone Encounter (Signed)
90 days needs to virtual visit this spring

## 2019-05-06 ENCOUNTER — Other Ambulatory Visit: Payer: Self-pay | Admitting: Family Medicine

## 2019-05-13 ENCOUNTER — Telehealth: Payer: Self-pay | Admitting: Family Medicine

## 2019-05-13 DIAGNOSIS — E119 Type 2 diabetes mellitus without complications: Secondary | ICD-10-CM

## 2019-05-13 DIAGNOSIS — E7849 Other hyperlipidemia: Secondary | ICD-10-CM

## 2019-05-13 DIAGNOSIS — R5383 Other fatigue: Secondary | ICD-10-CM

## 2019-05-13 DIAGNOSIS — I10 Essential (primary) hypertension: Secondary | ICD-10-CM

## 2019-05-13 DIAGNOSIS — R600 Localized edema: Secondary | ICD-10-CM

## 2019-05-13 DIAGNOSIS — R0609 Other forms of dyspnea: Secondary | ICD-10-CM

## 2019-05-13 NOTE — Telephone Encounter (Signed)
Metabolic 7, liver, lipid, A1c, TSH, CBC, BNP If possible patient should try to get lab drawn tomorrow even if it is not fasting Diabetes, hypertension, hyperlipidemia, other fatigue, DOE, pedal edema  I also think it is a good idea for the patient to do it in the office visit later this week to be checked

## 2019-05-13 NOTE — Telephone Encounter (Signed)
Patient has noticed he has gained weight in his stomach and wanting to make sure its not fluid because he has had fluid in his legs before, Please advise

## 2019-05-13 NOTE — Telephone Encounter (Signed)
Pt states he has gained 15 lbs in the past 4-6 weeks. Pt has been weighing himself 2-3 times a week and had been ranging anywhere from 214-218 lbs. Pt has began to take Sugar free Metamucil due to unusual growling that his stomach has been doing. Pt states he has always become tired with activity but here recently has become worse. Pt is wanting to know if provider would like to do blood work. If blood work is ordered, pt would also like liver checked. Please advise. Thank you

## 2019-05-13 NOTE — Telephone Encounter (Signed)
Lab orders placed and pt is aware. Pt transferred up front to set up office visit to be checked. Pt verbalized understanding

## 2019-05-14 DIAGNOSIS — I10 Essential (primary) hypertension: Secondary | ICD-10-CM | POA: Diagnosis not present

## 2019-05-14 DIAGNOSIS — E119 Type 2 diabetes mellitus without complications: Secondary | ICD-10-CM | POA: Diagnosis not present

## 2019-05-14 DIAGNOSIS — R5383 Other fatigue: Secondary | ICD-10-CM | POA: Diagnosis not present

## 2019-05-14 DIAGNOSIS — R600 Localized edema: Secondary | ICD-10-CM | POA: Diagnosis not present

## 2019-05-14 DIAGNOSIS — E7849 Other hyperlipidemia: Secondary | ICD-10-CM | POA: Diagnosis not present

## 2019-05-14 DIAGNOSIS — R0600 Dyspnea, unspecified: Secondary | ICD-10-CM | POA: Diagnosis not present

## 2019-05-15 ENCOUNTER — Other Ambulatory Visit: Payer: Self-pay

## 2019-05-15 ENCOUNTER — Ambulatory Visit (INDEPENDENT_AMBULATORY_CARE_PROVIDER_SITE_OTHER): Payer: PPO | Admitting: Family Medicine

## 2019-05-15 ENCOUNTER — Encounter: Payer: Self-pay | Admitting: Family Medicine

## 2019-05-15 VITALS — BP 142/90 | Temp 97.4°F | Wt 236.6 lb

## 2019-05-15 DIAGNOSIS — K7460 Unspecified cirrhosis of liver: Secondary | ICD-10-CM

## 2019-05-15 DIAGNOSIS — R7989 Other specified abnormal findings of blood chemistry: Secondary | ICD-10-CM

## 2019-05-15 DIAGNOSIS — I10 Essential (primary) hypertension: Secondary | ICD-10-CM | POA: Diagnosis not present

## 2019-05-15 DIAGNOSIS — Z6835 Body mass index (BMI) 35.0-35.9, adult: Secondary | ICD-10-CM

## 2019-05-15 DIAGNOSIS — E876 Hypokalemia: Secondary | ICD-10-CM

## 2019-05-15 DIAGNOSIS — G4733 Obstructive sleep apnea (adult) (pediatric): Secondary | ICD-10-CM | POA: Diagnosis not present

## 2019-05-15 DIAGNOSIS — E119 Type 2 diabetes mellitus without complications: Secondary | ICD-10-CM | POA: Diagnosis not present

## 2019-05-15 LAB — CBC WITH DIFFERENTIAL/PLATELET
Basophils Absolute: 0.1 10*3/uL (ref 0.0–0.2)
Basos: 2 %
EOS (ABSOLUTE): 0.3 10*3/uL (ref 0.0–0.4)
Eos: 5 %
Hematocrit: 47 % (ref 37.5–51.0)
Hemoglobin: 15.5 g/dL (ref 13.0–17.7)
Immature Grans (Abs): 0 10*3/uL (ref 0.0–0.1)
Immature Granulocytes: 0 %
Lymphocytes Absolute: 1.6 10*3/uL (ref 0.7–3.1)
Lymphs: 29 %
MCH: 31 pg (ref 26.6–33.0)
MCHC: 33 g/dL (ref 31.5–35.7)
MCV: 94 fL (ref 79–97)
Monocytes Absolute: 0.5 10*3/uL (ref 0.1–0.9)
Monocytes: 10 %
Neutrophils Absolute: 3.1 10*3/uL (ref 1.4–7.0)
Neutrophils: 54 %
Platelets: 151 10*3/uL (ref 150–450)
RBC: 5 x10E6/uL (ref 4.14–5.80)
RDW: 13.7 % (ref 11.6–15.4)
WBC: 5.5 10*3/uL (ref 3.4–10.8)

## 2019-05-15 LAB — HEPATIC FUNCTION PANEL
ALT: 35 IU/L (ref 0–44)
AST: 46 IU/L — ABNORMAL HIGH (ref 0–40)
Albumin: 3.6 g/dL — ABNORMAL LOW (ref 3.8–4.8)
Alkaline Phosphatase: 185 IU/L — ABNORMAL HIGH (ref 39–117)
Bilirubin Total: 0.9 mg/dL (ref 0.0–1.2)
Bilirubin, Direct: 0.33 mg/dL (ref 0.00–0.40)
Total Protein: 6.9 g/dL (ref 6.0–8.5)

## 2019-05-15 LAB — BASIC METABOLIC PANEL
BUN/Creatinine Ratio: 8 — ABNORMAL LOW (ref 10–24)
BUN: 6 mg/dL — ABNORMAL LOW (ref 8–27)
CO2: 26 mmol/L (ref 20–29)
Calcium: 8.9 mg/dL (ref 8.6–10.2)
Chloride: 105 mmol/L (ref 96–106)
Creatinine, Ser: 0.79 mg/dL (ref 0.76–1.27)
GFR calc Af Amer: 111 mL/min/{1.73_m2} (ref >59)
GFR calc non Af Amer: 96 mL/min/{1.73_m2} (ref >59)
Glucose: 104 mg/dL — ABNORMAL HIGH (ref 65–99)
Potassium: 3.3 mmol/L — ABNORMAL LOW (ref 3.5–5.2)
Sodium: 146 mmol/L — ABNORMAL HIGH (ref 134–144)

## 2019-05-15 LAB — HEMOGLOBIN A1C
Est. average glucose Bld gHb Est-mCnc: 148 mg/dL
Hgb A1c MFr Bld: 6.8 % — ABNORMAL HIGH (ref 4.8–5.6)

## 2019-05-15 LAB — TSH: TSH: 3.01 u[IU]/mL (ref 0.450–4.500)

## 2019-05-15 LAB — LIPID PANEL
Chol/HDL Ratio: 4.6 ratio (ref 0.0–5.0)
Cholesterol, Total: 169 mg/dL (ref 100–199)
HDL: 37 mg/dL — ABNORMAL LOW (ref >39)
LDL Chol Calc (NIH): 110 mg/dL — ABNORMAL HIGH (ref 0–99)
Triglycerides: 122 mg/dL (ref 0–149)
VLDL Cholesterol Cal: 22 mg/dL (ref 5–40)

## 2019-05-15 LAB — BRAIN NATRIURETIC PEPTIDE: BNP: 15.6 pg/mL (ref 0.0–100.0)

## 2019-05-15 MED ORDER — POTASSIUM CHLORIDE CRYS ER 20 MEQ PO TBCR: EXTENDED_RELEASE_TABLET | ORAL | 1 refills | Status: DC

## 2019-05-15 MED ORDER — FUROSEMIDE 20 MG PO TABS: 40.0000 mg | ORAL_TABLET | Freq: Every day | ORAL | 0 refills | Status: DC

## 2019-05-15 NOTE — Telephone Encounter (Signed)
Patient states didn't want to schedule follow up visit at check-out right now will call back to schedule.

## 2019-05-15 NOTE — Patient Instructions (Signed)
Results for orders placed or performed in visit on [id number]  Basic Metabolic Panel (BMET)  Result Value Ref Range   Glucose 104 (H) 65 - 99 mg/dL   BUN 6 (L) 8 - 27 mg/dL   Creatinine, Ser 0.79 0.76 - 1.27 mg/dL   GFR calc non Af Amer 96 >59 mL/min/1.73   GFR calc Af Amer 111 >59 mL/min/1.73   BUN/Creatinine Ratio 8 (L) 10 - 24   Sodium 146 (H) 134 - 144 mmol/L   Potassium 3.3 (L) 3.5 - 5.2 mmol/L   Chloride 105 96 - 106 mmol/L   CO2 26 20 - 29 mmol/L   Calcium 8.9 8.6 - 10.2 mg/dL  Hepatic function panel  Result Value Ref Range   Total Protein 6.9 6.0 - 8.5 g/dL   Albumin 3.6 (L) 3.8 - 4.8 g/dL   Bilirubin Total 0.9 0.0 - 1.2 mg/dL   Bilirubin, Direct 0.33 0.00 - 0.40 mg/dL   Alkaline Phosphatase 185 (H) 39 - 117 IU/L   AST 46 (H) 0 - 40 IU/L   ALT 35 0 - 44 IU/L  Lipid Profile  Result Value Ref Range   Cholesterol, Total 169 100 - 199 mg/dL   Triglycerides 122 0 - 149 mg/dL   HDL 37 (L) >39 mg/dL   VLDL Cholesterol Cal 22 5 - 40 mg/dL   LDL Chol Calc (NIH) 110 (H) 0 - 99 mg/dL   Chol/HDL Ratio 4.6 0.0 - 5.0 ratio  Hemoglobin A1c  Result Value Ref Range   Hgb A1c MFr Bld 6.8 (H) 4.8 - 5.6 %   Est. average glucose Bld gHb Est-mCnc 148 mg/dL  TSH  Result Value Ref Range   TSH 3.010 0.450 - 4.500 uIU/mL  CBC with Differential  Result Value Ref Range   WBC 5.5 3.4 - 10.8 x10E3/uL   RBC 5.00 4.14 - 5.80 x10E6/uL   Hemoglobin 15.5 13.0 - 17.7 g/dL   Hematocrit 47.0 37.5 - 51.0 %   MCV 94 79 - 97 fL   MCH 31.0 26.6 - 33.0 pg   MCHC 33.0 31.5 - 35.7 g/dL   RDW 13.7 11.6 - 15.4 %   Platelets 151 150 - 450 x10E3/uL   Neutrophils 54 Not Estab. %   Lymphs 29 Not Estab. %   Monocytes 10 Not Estab. %   Eos 5 Not Estab. %   Basos 2 Not Estab. %   Neutrophils Absolute 3.1 1.4 - 7.0 x10E3/uL   Lymphocytes Absolute 1.6 0.7 - 3.1 x10E3/uL   Monocytes Absolute 0.5 0.1 - 0.9 x10E3/uL   EOS (ABSOLUTE) 0.3 0.0 - 0.4 x10E3/uL   Basophils Absolute 0.1 0.0 - 0.2 x10E3/uL   Immature  Granulocytes 0 Not Estab. %   Immature Grans (Abs) 0.0 0.0 - 0.1 x10E3/uL  B Nat Peptide  Result Value Ref Range   BNP WILL FOLLOW    It is possible that this could be related to swelling in the abdomen related to the cirrhosis.  It is important for you to do ultrasound in order to look at the liver plus also the abdomen to make sure that there is not significant amount of fluid called ascites. We are adding a fluid medication called Lasix 20 mg take 2 each morning Increase potassium taking 1 in the morning 1 in the evening Please check metabolic 7 in 2 weeks time Follow-up office visit in 2 to 3 weeks. Call us sooner if any problems

## 2019-05-15 NOTE — Progress Notes (Signed)
Subjective:    Patient ID: Shawn [name]., male    DOB: [birth year], 64 y.o.   MRN: [id number]  HPI Patient arrives to discuss recent weight gain. Patient states he has gained 15 lbs in last 6 weeks and has been having issues with his stomach.  The Patient relates that he has been swelling some in the legs but a lot of swelling in his abdomen he has a very difficult time utilizing any of his pants he finds himself feeling very bloated feeling denies shortness of breath but states he cannot bend over to tie his shoes well.  He has a longstanding history of nonalcoholic fatty liver cirrhosis he has seen gastroenterology in the past but stopped going there a couple years ago  Review of Systems  Constitutional: Positive for fatigue. Negative for activity change, appetite change and fever.  HENT: Negative for congestion and rhinorrhea.   Eyes: Negative for discharge.  Respiratory: Negative for cough and wheezing.   Cardiovascular: Negative for chest pain and leg swelling.  Gastrointestinal: Negative for abdominal pain, blood in stool and vomiting.  Genitourinary: Negative for difficulty urinating and frequency.  Musculoskeletal: Negative for neck pain.  Skin: Negative for rash.  Allergic/Immunologic: Negative for environmental allergies and food allergies.  Neurological: Positive for weakness. Negative for headaches.  Psychiatric/Behavioral: Negative for agitation.   Results for orders placed or performed in visit on [id number]  Basic Metabolic Panel (BMET)  Result Value Ref Range   Glucose 104 (H) 65 - 99 mg/dL   BUN 6 (L) 8 - 27 mg/dL   Creatinine, Ser 0.79 0.76 - 1.27 mg/dL   GFR calc non Af Amer 96 >59 mL/min/1.73   GFR calc Af Amer 111 >59 mL/min/1.73   BUN/Creatinine Ratio 8 (L) 10 - 24   Sodium 146 (H) 134 - 144 mmol/L   Potassium 3.3 (L) 3.5 - 5.2 mmol/L   Chloride 105 96 - 106 mmol/L   CO2 26 20 - 29 mmol/L   Calcium 8.9 8.6 - 10.2 mg/dL  Hepatic function panel  Result  Value Ref Range   Total Protein 6.9 6.0 - 8.5 g/dL   Albumin 3.6 (L) 3.8 - 4.8 g/dL   Bilirubin Total 0.9 0.0 - 1.2 mg/dL   Bilirubin, Direct 0.33 0.00 - 0.40 mg/dL   Alkaline Phosphatase 185 (H) 39 - 117 IU/L   AST 46 (H) 0 - 40 IU/L   ALT 35 0 - 44 IU/L  Lipid Profile  Result Value Ref Range   Cholesterol, Total 169 100 - 199 mg/dL   Triglycerides 122 0 - 149 mg/dL   HDL 37 (L) >39 mg/dL   VLDL Cholesterol Cal 22 5 - 40 mg/dL   LDL Chol Calc (NIH) 110 (H) 0 - 99 mg/dL   Chol/HDL Ratio 4.6 0.0 - 5.0 ratio  Hemoglobin A1c  Result Value Ref Range   Hgb A1c MFr Bld 6.8 (H) 4.8 - 5.6 %   Est. average glucose Bld gHb Est-mCnc 148 mg/dL  TSH  Result Value Ref Range   TSH 3.010 0.450 - 4.500 uIU/mL  CBC with Differential  Result Value Ref Range   WBC 5.5 3.4 - 10.8 x10E3/uL   RBC 5.00 4.14 - 5.80 x10E6/uL   Hemoglobin 15.5 13.0 - 17.7 g/dL   Hematocrit 47.0 37.5 - 51.0 %   MCV 94 79 - 97 fL   MCH 31.0 26.6 - 33.0 pg   MCHC 33.0 31.5 - 35.7 g/dL   RDW 13.7 11.6 -  15.4 %   Platelets 151 150 - 450 x10E3/uL   Neutrophils 54 Not Estab. %   Lymphs 29 Not Estab. %   Monocytes 10 Not Estab. %   Eos 5 Not Estab. %   Basos 2 Not Estab. %   Neutrophils Absolute 3.1 1.4 - 7.0 x10E3/uL   Lymphocytes Absolute 1.6 0.7 - 3.1 x10E3/uL   Monocytes Absolute 0.5 0.1 - 0.9 x10E3/uL   EOS (ABSOLUTE) 0.3 0.0 - 0.4 x10E3/uL   Basophils Absolute 0.1 0.0 - 0.2 x10E3/uL   Immature Granulocytes 0 Not Estab. %   Immature Grans (Abs) 0.0 0.0 - 0.1 x10E3/uL  B Nat Peptide  Result Value Ref Range   BNP 15.6 0.0 - 100.0 pg/mL        Objective:   Physical Exam Constitutional:      Appearance: He is well-developed.  HENT:     Head: Normocephalic and atraumatic.     Right Ear: External ear normal.     Left Ear: External ear normal.     Nose: Nose normal.  Eyes:     Pupils: Pupils are equal, round, and reactive to light.  Neck:     Thyroid: No thyromegaly.  Cardiovascular:     Rate and  Rhythm: Normal rate and regular rhythm.     Heart sounds: Normal heart sounds. No murmur.  Pulmonary:     Effort: Pulmonary effort is normal. No respiratory distress.     Breath sounds: Normal breath sounds. No wheezing.  Abdominal:     General: Bowel sounds are normal. There is no distension.     Palpations: Abdomen is soft. There is no mass.     Tenderness: There is no abdominal tenderness.  Genitourinary:    Penis: Normal.   Musculoskeletal:        General: Normal range of motion.     Cervical back: Normal range of motion and neck supple.  Lymphadenopathy:     Cervical: No cervical adenopathy.  Skin:    General: Skin is warm and dry.     Findings: No erythema.  Neurological:     Mental Status: He is alert.     Motor: No abnormal muscle tone.  Psychiatric:        Behavior: Behavior normal.        Judgment: Judgment normal.    Cirrhosis, non-alcoholic (HCC) - Plan: US Abdomen Complete  Elevated LFTs - Plan: US Abdomen Complete  Hypokalemia - Plan: Basic metabolic panel  Essential hypertension, benign  Obstructive sleep apnea  Type 2 diabetes mellitus without complication, without long-term current use of insulin (HCC)  Class 2 severe obesity with serious comorbidity and body mass index (BMI) of 35.0 to 35.9 in adult, unspecified obesity type (HCC)   30 minutes spent with the patient discussing multiple health issues plus also concerning for the possibility of developing ascites with cirrhosis ultrasound pending time also spent reviewing chart discussing results of labs with patient plus documentation      Assessment & Plan:  1. Cirrhosis, non-alcoholic ([facility]) Worrisome for the possibility of ascites add Lasix May need to add spironolactone await the results of these tests patient to follow-up in approximately 3 weeks - US Abdomen Complete  2. Elevated LFTs Lab work did not look bad but with a significant weight gain very important for him to minimize salt in the  diet - US Abdomen Complete  3. Hypokalemia Bump up the potassium to twice per day - Basic metabolic panel We did discuss  referral to gastroenterology he would like to hold off on this currently Diabetes control overall very good.  Recheck 3 weeks No sign of heart failure await the results of the ultrasound

## 2019-05-21 ENCOUNTER — Other Ambulatory Visit: Payer: Self-pay

## 2019-05-21 ENCOUNTER — Other Ambulatory Visit: Payer: Self-pay | Admitting: Family Medicine

## 2019-05-21 ENCOUNTER — Ambulatory Visit (HOSPITAL_COMMUNITY)
Admission: RE | Admit: 2019-05-21 | Discharge: 2019-05-21 | Disposition: A | Discharge status: Home / Self Care | Payer: PPO | Source: Ambulatory Visit | Attending: Family Medicine | Admitting: Family Medicine

## 2019-05-21 DIAGNOSIS — K7460 Unspecified cirrhosis of liver: Secondary | ICD-10-CM | POA: Diagnosis not present

## 2019-05-21 DIAGNOSIS — R188 Other ascites: Secondary | ICD-10-CM | POA: Diagnosis not present

## 2019-05-21 DIAGNOSIS — K8020 Calculus of gallbladder without cholecystitis without obstruction: Secondary | ICD-10-CM | POA: Diagnosis not present

## 2019-05-21 DIAGNOSIS — R7989 Other specified abnormal findings of blood chemistry: Secondary | ICD-10-CM | POA: Insufficient documentation

## 2019-05-21 DIAGNOSIS — R945 Abnormal results of liver function studies: Secondary | ICD-10-CM | POA: Diagnosis not present

## 2019-05-21 IMAGING — US US ABDOMEN COMPLETE
1 series · 13 of 25 positions shown · non-contrast
Comparison: Right upper quadrant ultrasound [DATE]

CLINICAL DATA: Elevated liver enzymes. History of hepatic cirrhosis

EXAM:
ABDOMEN ULTRASOUND COMPLETE

[Series 1: us abdomen complete · 13 of 75 slices shown]
[im 1/75]
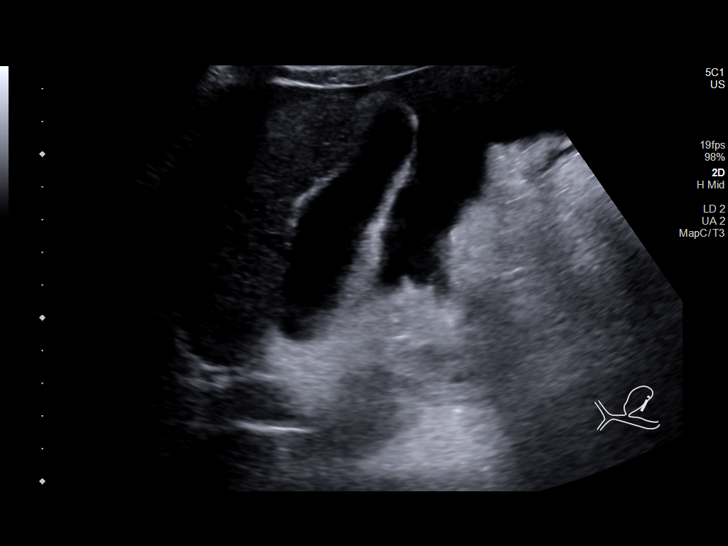
[im 7/75]
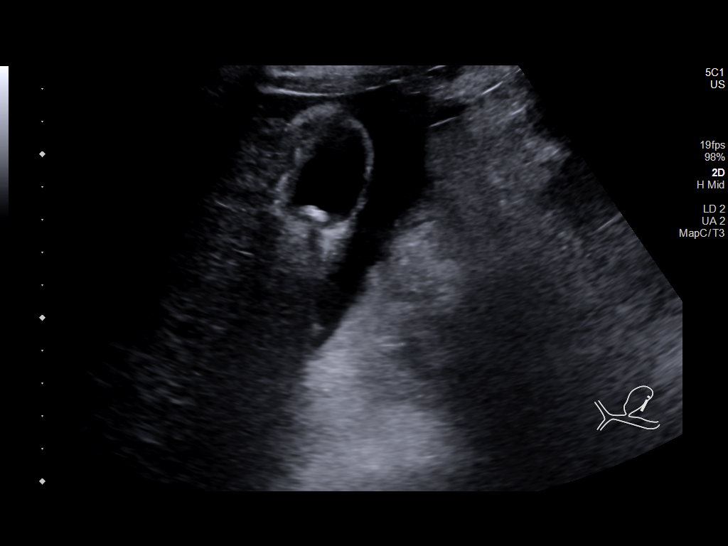
[im 13/75]
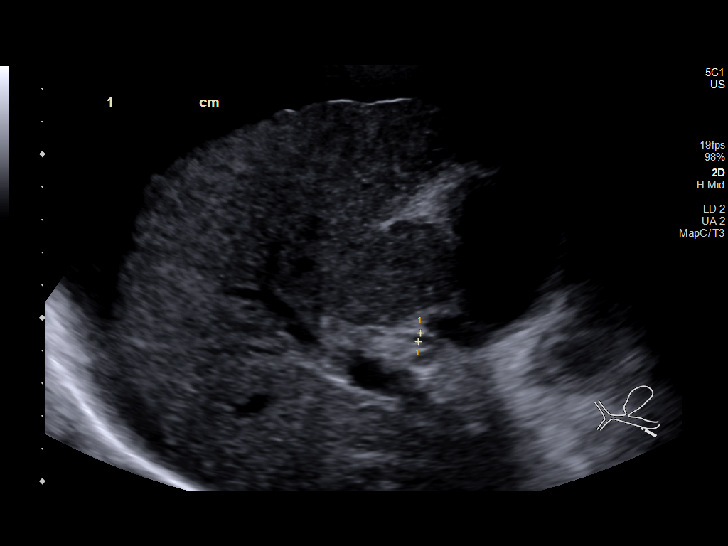
[im 19/75]
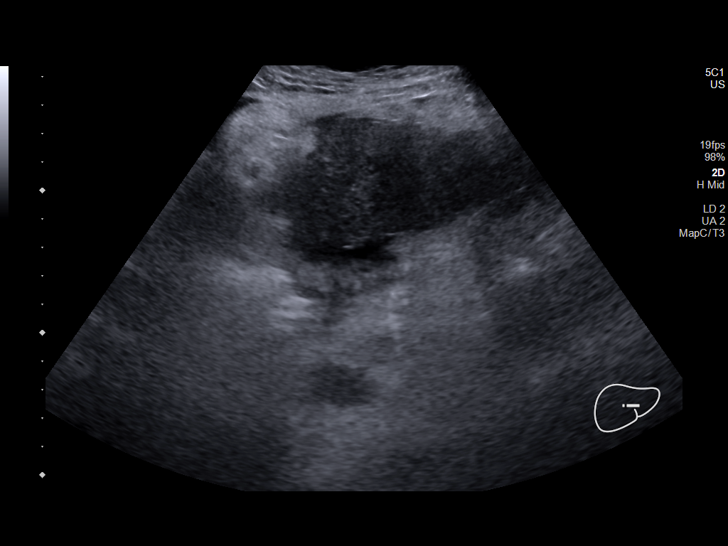
[im 25/75]
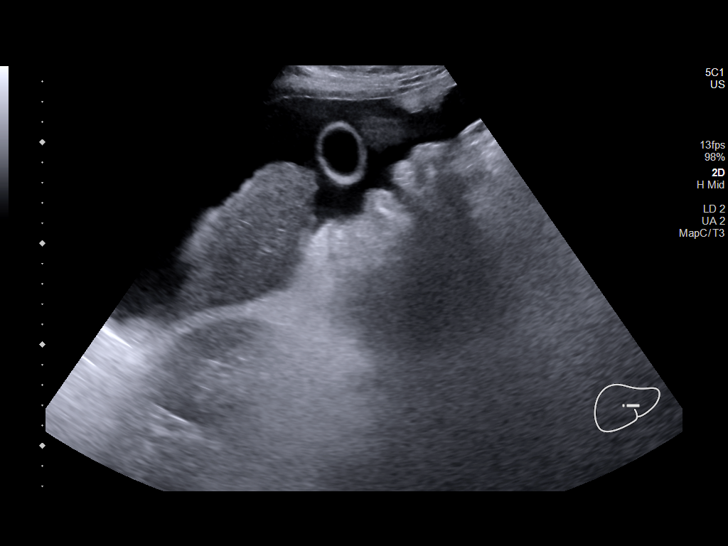
[im 31/75]
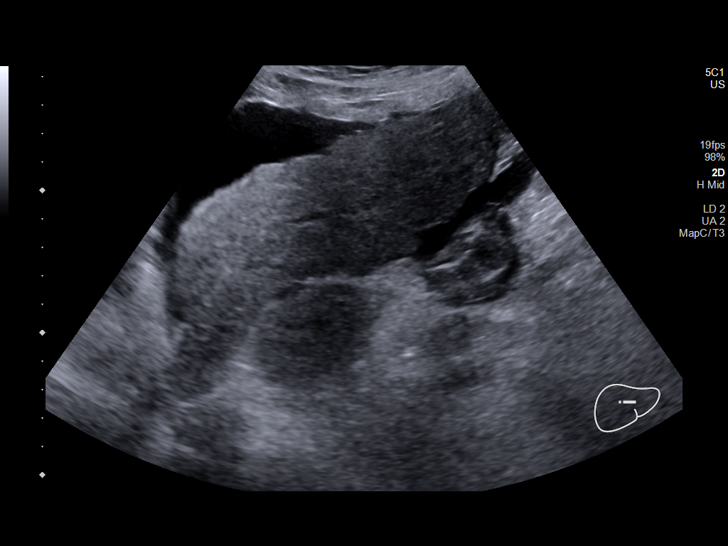
[im 38/75]
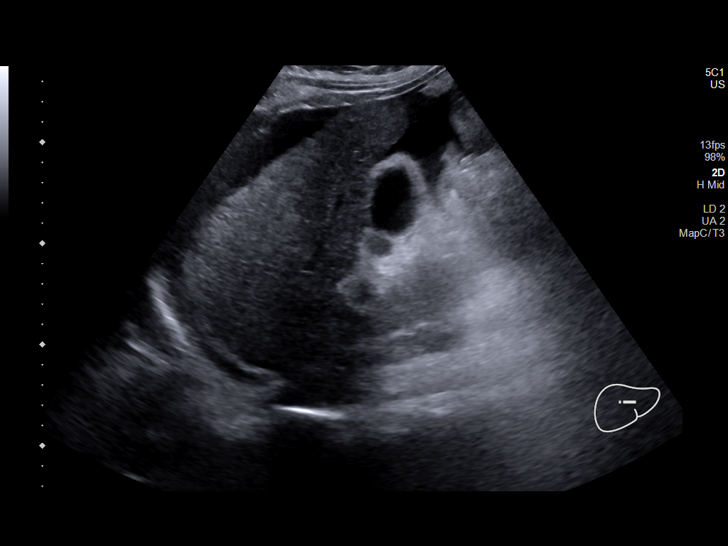
[im 44/75]
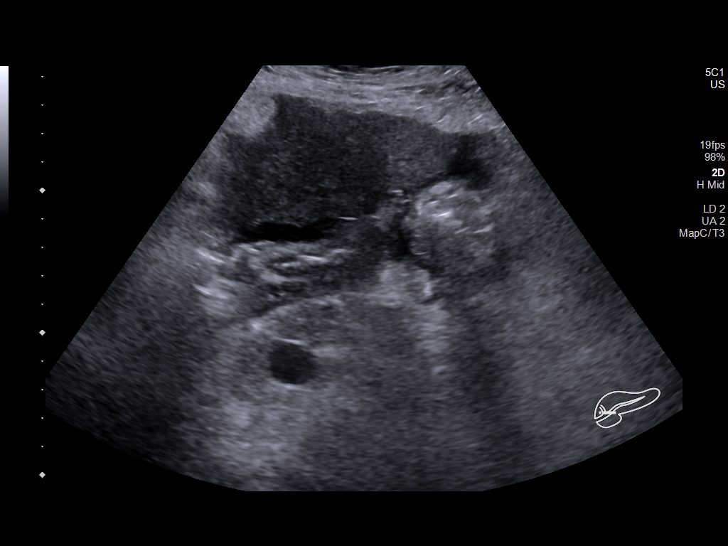
[im 50/75]
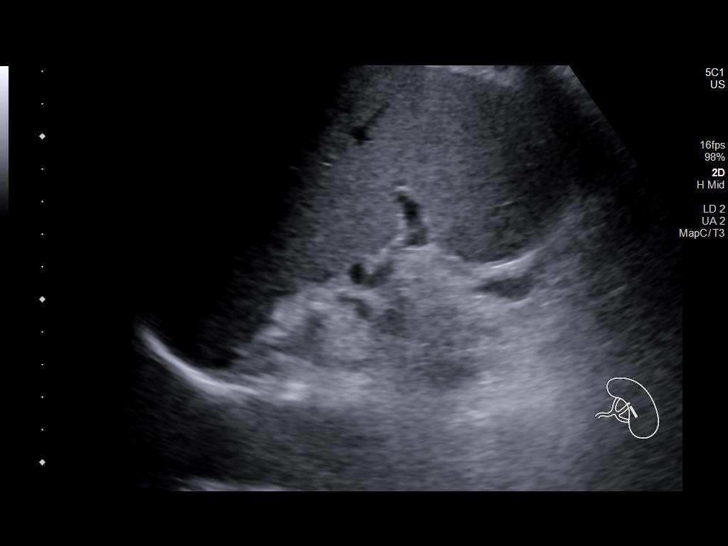
[im 56/75]
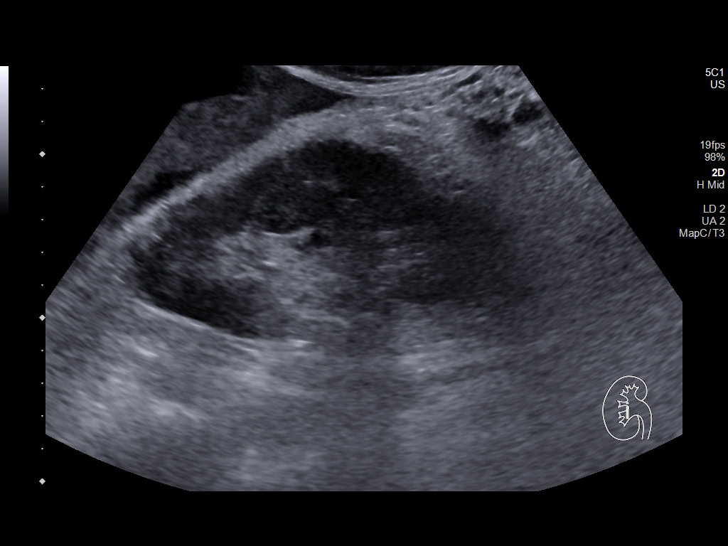
[im 62/75]
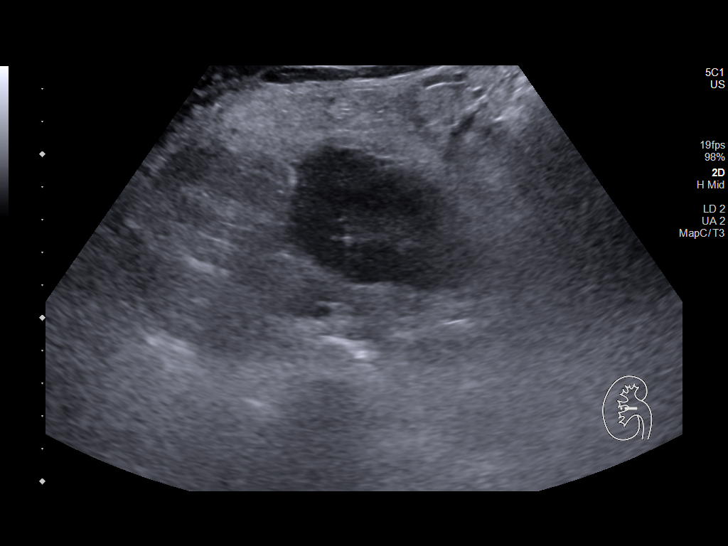
[im 68/75]
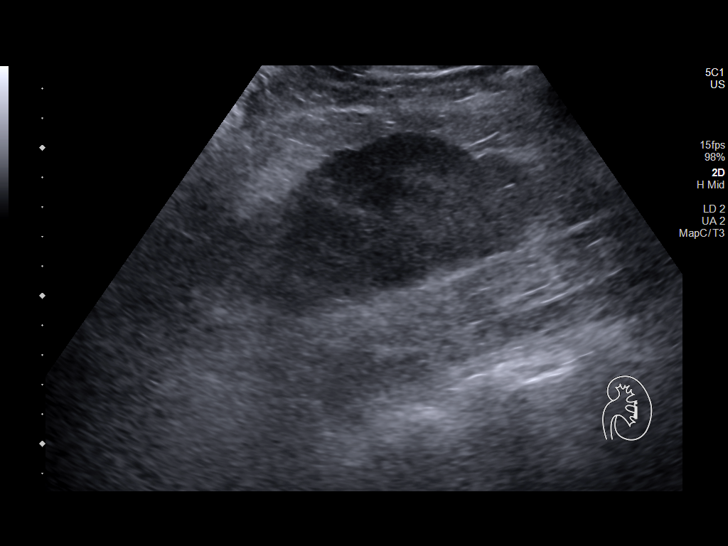
[im 75/75]
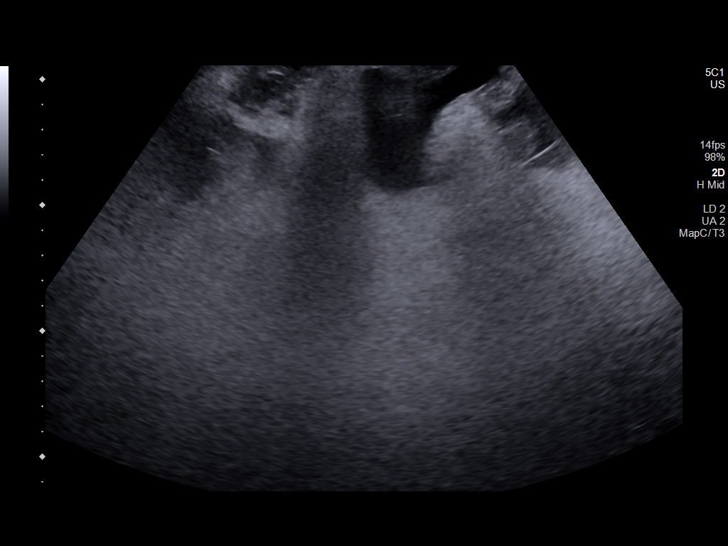

[13 of 25 positions shown; findings below may reference images not displayed]

FINDINGS: Gallbladder: Within the gallbladder, there are echogenic foci which
move and shadow consistent with cholelithiasis. Largest gallstone
measures 9 mm in length. The gallbladder wall is borderline
thickened without edema. No pericholecystic fluid. No sonographic
Murphy sign noted by sonographer.

Common bile duct: Diameter: 3 mm. No intrahepatic, common hepatic,
or common bile duct dilatation.

Liver: No focal lesion identified. Liver is rather small with a
nodular contour. The liver echogenicity is coarsened and somewhat
inhomogeneous. Portal vein is patent on color Doppler imaging with
normal direction of blood flow towards the liver.

IVC: No abnormality visualized.

Pancreas: Visualized portion unremarkable. Most of pancreas obscured
by gas.

Spleen: Spleen measures 11.9 x 10.4 x 11.3 cm in size with a
measured volume of 726 cubic cm. No focal splenic lesions are
evident.

Right Kidney: Length: 12.1 cm. Echogenicity within normal limits. No
mass or hydronephrosis visualized.

Left Kidney: Length: 13.5 cm. Echogenicity within normal limits. No
mass or hydronephrosis visualized.

Abdominal aorta: No aneurysm visualized.

Other findings: There is moderate ascites evident.
IMPRESSION: 1. Small liver with nodular contour. Echotexture is heterogeneous
and coarsened, changes indicative of hepatic cirrhosis. While no
focal liver lesions are evident on this study, it must be cautioned
that the sensitivity of ultrasound for detection of focal liver
lesions is diminished in this circumstance.

2.  Moderate ascites.

3.  Splenic prominence without focal splenic lesion.

4. Cholelithiasis. Borderline gallbladder wall thickening may be due
to the ascites. If there is clinical concern for acute
cholecystitis, nuclear medicine hepatobiliary imaging study to
assess for cystic duct patency may be reasonable.

5.  Most of pancreas obscured by gas.

## 2019-05-23 ENCOUNTER — Encounter: Payer: Self-pay | Admitting: Family Medicine

## 2019-05-23 ENCOUNTER — Other Ambulatory Visit: Payer: Self-pay | Admitting: *Deleted

## 2019-05-23 DIAGNOSIS — Z79899 Other long term (current) drug therapy: Secondary | ICD-10-CM

## 2019-05-23 DIAGNOSIS — R188 Other ascites: Secondary | ICD-10-CM

## 2019-05-23 MED ORDER — SPIRONOLACTONE 50 MG PO TABS: 50.0000 mg | ORAL_TABLET | Freq: Every day | ORAL | 3 refills | Status: DC

## 2019-05-29 ENCOUNTER — Ambulatory Visit (INDEPENDENT_AMBULATORY_CARE_PROVIDER_SITE_OTHER): Payer: PPO | Admitting: Gastroenterology

## 2019-05-29 ENCOUNTER — Telehealth: Payer: Self-pay | Admitting: Gastroenterology

## 2019-05-29 ENCOUNTER — Other Ambulatory Visit: Payer: Self-pay

## 2019-05-29 ENCOUNTER — Other Ambulatory Visit: Payer: Self-pay | Admitting: *Deleted

## 2019-05-29 ENCOUNTER — Encounter: Payer: Self-pay | Admitting: Gastroenterology

## 2019-05-29 ENCOUNTER — Encounter: Payer: Self-pay | Admitting: *Deleted

## 2019-05-29 VITALS — BP 162/99 | HR 75 | Temp 97.3°F | Ht 66.0 in | Wt 234.8 lb

## 2019-05-29 DIAGNOSIS — R188 Other ascites: Secondary | ICD-10-CM

## 2019-05-29 DIAGNOSIS — K7460 Unspecified cirrhosis of liver: Secondary | ICD-10-CM

## 2019-05-29 NOTE — Progress Notes (Signed)
Primary Care Physician: [name], MD  Primary Gastroenterologist:  Garfield Cornea, MD   Chief Complaint  Patient presents with  . Cirrhosis    c/o ascites. Weight gain of about 20 lbs in 2 weeks    HPI: Shawn [name]. is a 64 y.o. male here for urgent follow-up of cirrhosis at the request of Dr. Wolfgang Phoenix.  Patient last seen April 2019, missed his February 2020 appointment.  History of cirrhosis felt to be due to [place].  History of diabetes for 5 years ago normal no alcohol since 2013.  Some intermittent heavy drinking in the past but not necessarily on a regular basis.  Has been well compensated up until recently.  Saw PCP recently for increasing abdominal girth.  Ultrasound March 16 showed cholelithiasis, nodular small liver, letter ascites and prominent spleen.  Patient reports gaining up into the 240 range prior to starting diuretics.  Down to 227 at home today without close.  Abdomen became so tense and is having discomfort from his skin stretching, tenderness under the umbilicus, difficulty taking deep breaths.  No nausea or vomiting.  States after starting diuretics, his distention has improved slightly.  He has had some lower extremity edema.  Movements regular.  No melena or rectal bleeding.  No heartburn or dysphagia.  Recent labs with increase in alkaline phosphatase.  Back in 2019 normal at 106.  October 2020 alkaline phosphatase 150, recently up to 185.  AST slightly elevated at 46, ALT normal.  2016 Hepatitis C negative.  Needs to be checked for hepatitis A and B immunity as well as chronic hepatitis B  Wt Readings from Last 3 Encounters:  05/29/19 234 lb 12.8 oz (106.5 kg)  05/15/19 236 lb 9.6 oz (107.3 kg)  01/17/18 218 lb 0.8 oz (98.9 kg)   Brother with history of cirrhosis, history of obesity and diabetes.  Passed away this past year due to complications of Covid.  Current Outpatient Medications  Medication Sig Dispense Refill  . amLODipine (NORVASC) 10 MG  tablet TAKE 1 TABLET BY MOUTH EVERY DAY 90 tablet 1  . ascorbic acid (VITAMIN C) 500 MG tablet Take 500 mg by mouth daily.    Marland Kitchen aspirin EC 81 MG tablet Take 81 mg by mouth daily.    . blood glucose meter kit and supplies KIT Dispense based on patient and insurance preference. Use daily as directed. (FOR ICD - 10 E11.9) 1 each 5  . Cyanocobalamin (B-12 PO) Take by mouth daily.    . diazepam (VALIUM) 10 MG tablet TAKE 1 TABLET BY MOUTH TWICE A DAY AS NEEDED (Patient taking differently: Take 10 mg by mouth in the morning and at bedtime. TAKE 1 TABLET BY MOUTH TWICE A DAY AS NEEDED) 30 tablet 2  . furosemide (LASIX) 20 MG tablet Take 2 tablets (40 mg total) by mouth daily. 60 tablet 0  . glipiZIDE (GLUCOTROL XL) 5 MG 24 hr tablet TAKE 1 TABLET BY MOUTH EVERY DAY 90 tablet 1  . Lancets (ONETOUCH DELICA PLUS [id number]) MISC USE TO OBTAIN A BLOOD SPECIMENT OT TEST BLOOD SUGAR DAILY 100 each 0  . lisinopril (ZESTRIL) 40 MG tablet TAKE 1 TABLET BY MOUTH EVERY DAY 90 tablet 0  . metFORMIN (GLUCOPHAGE) 1000 MG tablet TAKE 1 TABLET BY MOUTH TWICE A DAY (Patient taking differently: Take 1,000 mg by mouth daily with breakfast. ) 180 tablet 0  . metoprolol succinate (TOPROL-XL) 50 MG 24 hr tablet TAKE 1 TABLET BY  MOUTH EVERY DAY WITH FOOD 90 tablet 0  . NON FORMULARY Cordyceps 567m daily    . Omega-3 Fatty Acids (FISH OIL) 1200 MG CAPS Take by mouth daily.    .Glory RosebushVERIO test strip USET TO TEST BLOOD SUGAR ONCE DAILY DX- E11.9 100 strip 0  . OVER THE COUNTER MEDICATION Active mind 10053mdaily    . pantoprazole (PROTONIX) 40 MG tablet Take 1 tablet (40 mg total) by mouth daily. 90 tablet 1  . potassium chloride SA (KLOR-CON M20) 20 MEQ tablet Take one tablet po twice a day 180 tablet 1  . pravastatin (PRAVACHOL) 80 MG tablet TAKE 1 TABLET BY MOUTH EVERY DAY 90 tablet 1  . spironolactone (ALDACTONE) 50 MG tablet Take 1 tablet (50 mg total) by mouth daily. 30 tablet 3  . venlafaxine XR (EFFEXOR-XR) 150 MG  24 hr capsule TAKE 1 CAPSULE BY MOUTH TWICE A DAY 180 capsule 0  . zinc gluconate 50 MG tablet Take 50 mg by mouth daily.     No current facility-administered medications for this visit.    Allergies as of 05/29/2019  . (No Known Allergies)    ROS:  General: Negative for anorexia, weight loss, fever, chills, fatigue, weakness. ENT: Negative for hoarseness, difficulty swallowing , nasal congestion. CV: Negative for chest pain, angina, palpitations, dyspnea on exertion, positive peripheral edema.  Respiratory: Negative for dyspnea at rest, dyspnea on exertion, cough, sputum, wheezing.  See HPI GI: See history of present illness. GU:  Negative for dysuria, hematuria, urinary incontinence, urinary frequency, nocturnal urination.  Endo:   See HPI   Physical Examination:   BP (!) 162/99   Pulse 75   Temp (!) 97.3 F (36.3 C) (Oral)   Ht _0  (1.676 m)   Wt 234 lb 12.8 oz (106.5 kg)   BMI 37.90 kg/m   General: Well-nourished, well-developed in no acute distress.  Eyes: No icterus. Mouth: masked Lungs: Clear to auscultation bilaterally.  Heart: Regular rate and rhythm, no murmurs rubs or gallops.  Abdomen: Bowel sounds are normal, abdomen distended.  Tense.  Tender under the umbilicus but no notable hernia. nondistended, no hepatosplenomegaly or masses, no abdominal bruits or hernia , no rebound or guarding.   Extremities: 2+ pitting edema to the knees bilaterally. No clubbing or deformities. Neuro: Alert and oriented x 4   Skin: Warm and dry, no jaundice.   Psych: Alert and cooperative, normal mood and affect.  Labs:  Lab Results  Component Value Date   CREATININE 0.79 05/14/2019   BUN 6 (L) 05/14/2019   NA 146 (H) 05/14/2019   K 3.3 (L) 05/14/2019   CL 105 05/14/2019   CO2 26 05/14/2019   Lab Results  Component Value Date   ALT 35 05/14/2019   AST 46 (H) 05/14/2019   ALKPHOS 185 (H) 05/14/2019   BILITOT 0.9 05/14/2019   Lab Results  Component Value Date   WBC  5.5 05/14/2019   HGB 15.5 05/14/2019   HCT 47.0 05/14/2019   MCV 94 05/14/2019   PLT 151 05/14/2019   Lab Results  Component Value Date   HGBA1C 6.8 (H) 05/14/2019   Lab Results  Component Value Date   TSH 3.010 05/14/2019      Imaging Studies: USKoreabdomen Complete  Result Date: 05/21/2019 CLINICAL DATA:  Elevated liver enzymes. History of hepatic cirrhosis EXAM: ABDOMEN ULTRASOUND COMPLETE COMPARISON:  Right upper quadrant ultrasound March 15, 2016 FINDINGS: Gallbladder: Within the gallbladder, there are echogenic foci which move  and shadow consistent with cholelithiasis. Largest gallstone measures 9 mm in length. The gallbladder wall is borderline thickened without edema. No pericholecystic fluid. No sonographic Murphy sign noted by sonographer. Common bile duct: Diameter: 3 mm. No intrahepatic, common hepatic, or common bile duct dilatation. Liver: No focal lesion identified. Liver is rather small with a nodular contour. The liver echogenicity is coarsened and somewhat inhomogeneous. Portal vein is patent on color Doppler imaging with normal direction of blood flow towards the liver. IVC: No abnormality visualized. Pancreas: Visualized portion unremarkable. Most of pancreas obscured by gas. Spleen: Spleen measures 11.9 x 10.4 x 11.3 cm in size with a measured volume of 726 cubic cm. No focal splenic lesions are evident. Right Kidney: Length: 12.1 cm. Echogenicity within normal limits. No mass or hydronephrosis visualized. Left Kidney: Length: 13.5 cm. Echogenicity within normal limits. No mass or hydronephrosis visualized. Abdominal aorta: No aneurysm visualized. Other findings: There is moderate ascites evident. IMPRESSION: 1. Small liver with nodular contour. Echotexture is heterogeneous and coarsened, changes indicative of hepatic cirrhosis. While no focal liver lesions are evident on this study, it must be cautioned that the sensitivity of ultrasound for detection of focal liver lesions is  diminished in this circumstance. 2.  Moderate ascites. 3.  Splenic prominence without focal splenic lesion. 4. Cholelithiasis. Borderline gallbladder wall thickening may be due to the ascites. If there is clinical concern for acute cholecystitis, nuclear medicine hepatobiliary imaging study to assess for cystic duct patency may be reasonable. 5.  Most of pancreas obscured by gas. Electronically Signed   By: Lowella Grip III M.D.   On: 05/21/2019 15:26     Impression/plan    64 year old gentleman with history of cirrhosis, suspected [place], presenting with decompensation.  Started developing lower extremity edema and ascites over the past several weeks.     Anasarca/Ascites: Take lasix 40m once daily. Continue aldactone 531mdaily for now but if labs allow we will increase to 10072maily. Plan for LVAP in near future, will send fluid for analysis. Limit first tap to 4 liters to see how he tolerates. Discussed sodium restricted diet of 2000m38mily, hand out provided as well.   Increased alk phos along with AST: previous work up points towards NASH. Risk factors of obesity and diabetes. Prior remote intermittent etoh use although at times heavy, so cannot exclude etoh related component. He will need AMA to rule out primary biliary cholangitis. Still needs labs to rule out chronic Hep B, check immune status for Hep B/C.   Cirrhosis care: Will need EGD for esophageal variceal screening in the upcoming months. Will plan updating labs to determine MELD Na score. Plans for liver transplant evaluation in the near future once labs return etc.   Colon cancer screening: no prior colonoscopy. Mother had colon cancer. Will address at upcoming office visits.

## 2019-05-29 NOTE — Patient Instructions (Addendum)
1. Please have our labs done at the same time you go for Dr. Lance Sell labs. We will contact you with results as available. 2. We have set you up to have fluid drawn off your abdomen.  3. Please restrict your salt intake to no more than 2000mg . See handout.  4. Once your labs are all back, we will likely set you up for appointment to meet the liver transplant team at [facility].  5. Please take your lasix (furosemide) both pills (40mg ) at same time in the morning along with the spironolactone.    Low-Sodium Eating Plan Sodium, which is an element that makes up salt, helps you maintain a healthy balance of fluids in your body. Too much sodium can increase your blood pressure and cause fluid and waste to be held in your body. Your health care provider or dietitian may recommend following this plan if you have high blood pressure (hypertension), kidney disease, liver disease, or heart failure. Eating less sodium can help lower your blood pressure, reduce swelling, and protect your heart, liver, and kidneys. What are tips for following this plan? General guidelines  Most people on this plan should limit their sodium intake to 1,500-2,000 mg (milligrams) of sodium each day. Reading food labels   The Nutrition Facts label lists the amount of sodium in one serving of the food. If you eat more than one serving, you must multiply the listed amount of sodium by the number of servings.  Choose foods with less than 140 mg of sodium per serving.  Avoid foods with 300 mg of sodium or more per serving. Shopping  Look for lower-sodium products, often labeled as "low-sodium" or "no salt added."  Always check the sodium content even if foods are labeled as "unsalted" or "no salt added".  Buy fresh foods. ? Avoid canned foods and premade or frozen meals. ? Avoid canned, cured, or processed meats  Buy breads that have less than 80 mg of sodium per slice. Cooking  Eat more home-cooked food and less  restaurant, buffet, and fast food.  Avoid adding salt when cooking. Use salt-free seasonings or herbs instead of table salt or sea salt. Check with your health care provider or pharmacist before using salt substitutes.  Cook with plant-based oils, such as canola, sunflower, or olive oil. Meal planning  When eating at a restaurant, ask that your food be prepared with less salt or no salt, if possible.  Avoid foods that contain MSG (monosodium glutamate). MSG is sometimes added to [place] food, bouillon, and some canned foods. What foods are recommended? The items listed may not be a complete list. Talk with your dietitian about what dietary choices are best for you. Grains Low-sodium cereals, including oats, puffed wheat and rice, and shredded wheat. Low-sodium crackers. Unsalted rice. Unsalted pasta. Low-sodium bread. Whole-grain breads and whole-grain pasta. Vegetables Fresh or frozen vegetables. "No salt added" canned vegetables. "No salt added" tomato sauce and paste. Low-sodium or reduced-sodium tomato and vegetable juice. Fruits Fresh, frozen, or canned fruit. Fruit juice. Meats and other protein foods Fresh or frozen (no salt added) meat, poultry, seafood, and fish. Low-sodium canned tuna and salmon. Unsalted nuts. Dried peas, beans, and lentils without added salt. Unsalted canned beans. Eggs. Unsalted nut butters. Dairy Milk. Soy milk. Cheese that is naturally low in sodium, such as ricotta cheese, fresh mozzarella, or Swiss cheese Low-sodium or reduced-sodium cheese. Cream cheese. Yogurt. Fats and oils Unsalted butter. Unsalted margarine with no trans fat. Vegetable oils such as canola  or olive oils. Seasonings and other foods Fresh and dried herbs and spices. Salt-free seasonings. Low-sodium mustard and ketchup. Sodium-free salad dressing. Sodium-free light mayonnaise. Fresh or refrigerated horseradish. Lemon juice. Vinegar. Homemade, reduced-sodium, or low-sodium soups. Unsalted  popcorn and pretzels. Low-salt or salt-free chips. What foods are not recommended? The items listed may not be a complete list. Talk with your dietitian about what dietary choices are best for you. Grains Instant hot cereals. Bread stuffing, pancake, and biscuit mixes. Croutons. Seasoned rice or pasta mixes. Noodle soup cups. Boxed or frozen macaroni and cheese. Regular salted crackers. Self-rising flour. Vegetables Sauerkraut, pickled vegetables, and relishes. Olives. [place] fries. Onion rings. Regular canned vegetables (not low-sodium or reduced-sodium). Regular canned tomato sauce and paste (not low-sodium or reduced-sodium). Regular tomato and vegetable juice (not low-sodium or reduced-sodium). Frozen vegetables in sauces. Meats and other protein foods Meat or fish that is salted, canned, smoked, spiced, or pickled. Bacon, ham, sausage, hotdogs, corned beef, chipped beef, packaged lunch meats, salt pork, jerky, pickled herring, anchovies, regular canned tuna, sardines, salted nuts. Dairy Processed cheese and cheese spreads. Cheese curds. Blue cheese. Feta cheese. String cheese. Regular cottage cheese. Buttermilk. Canned milk. Fats and oils Salted butter. Regular margarine. Ghee. Bacon fat. Seasonings and other foods Onion salt, garlic salt, seasoned salt, table salt, and sea salt. Canned and packaged gravies. Worcestershire sauce. Tartar sauce. Barbecue sauce. Teriyaki sauce. Soy sauce, including reduced-sodium. Steak sauce. Fish sauce. Oyster sauce. Cocktail sauce. Horseradish that you find on the shelf. Regular ketchup and mustard. Meat flavorings and tenderizers. Bouillon cubes. Hot sauce and Tabasco sauce. Premade or packaged marinades. Premade or packaged taco seasonings. Relishes. Regular salad dressings. Salsa. Potato and tortilla chips. Corn chips and puffs. Salted popcorn and pretzels. Canned or dried soups. Pizza. Frozen entrees and pot pies. Summary  Eating less sodium can help lower  your blood pressure, reduce swelling, and protect your heart, liver, and kidneys.  Most people on this plan should limit their sodium intake to 1,500-2,000 mg (milligrams) of sodium each day.  Canned, boxed, and frozen foods are high in sodium. Restaurant foods, fast foods, and pizza are also very high in sodium. You also get sodium by adding salt to food.  Try to cook at home, eat more fresh fruits and vegetables, and eat less fast food, canned, processed, or prepared foods. This information is not intended to replace advice given to you by your health care provider. Make sure you discuss any questions you have with your health care provider. Document Revised: 02/03/2017 Document Reviewed: 02/15/2016 Elsevier Patient Education  2020 [employer].

## 2019-05-29 NOTE — Telephone Encounter (Signed)
Patient supposed to go for labs in the upcoming future to [facility].  He was provided some labs today but I needed to add on an AMA and AFP.  Orders have been entered.  Please make sure [facility]/patient aware of additional labs.

## 2019-05-29 NOTE — Telephone Encounter (Signed)
Spoke with pt. Added lab orders were mailed to pt and pt will take all forms to [facility] when received per pts request.

## 2019-05-29 NOTE — Progress Notes (Signed)
Cc'ed to pcp °

## 2019-05-30 ENCOUNTER — Encounter (HOSPITAL_COMMUNITY): Payer: Self-pay

## 2019-05-30 ENCOUNTER — Ambulatory Visit (HOSPITAL_COMMUNITY): Admission: RE | Admit: 2019-05-30 | Payer: PPO | Source: Ambulatory Visit

## 2019-05-30 ENCOUNTER — Ambulatory Visit (HOSPITAL_COMMUNITY)
Admission: RE | Admit: 2019-05-30 | Discharge: 2019-05-30 | Disposition: A | Discharge status: Home / Self Care | Payer: PPO | Source: Ambulatory Visit | Attending: Gastroenterology | Admitting: Gastroenterology

## 2019-05-30 DIAGNOSIS — R188 Other ascites: Secondary | ICD-10-CM | POA: Diagnosis not present

## 2019-05-30 LAB — BODY FLUID CELL COUNT WITH DIFFERENTIAL
Eos, Fluid: 0 %
Lymphs, Fluid: 76 %
Monocyte-Macrophage-Serous Fluid: 21 % — ABNORMAL LOW (ref 50–90)
Neutrophil Count, Fluid: 3 % (ref 0–25)
Other Cells, Fluid: REACTIVE %
Total Nucleated Cell Count, Fluid: 619 cu mm (ref 0–1000)

## 2019-05-30 LAB — GRAM STAIN: Gram Stain: NONE SEEN

## 2019-05-30 IMAGING — US US PARACENTESIS
1 series · 3 of 3 positions shown · non-contrast
Comparison: none

INDICATION: Ascites.

[Series 1: us paracentesis · 3 of 3 slices shown]
[im 1/3]
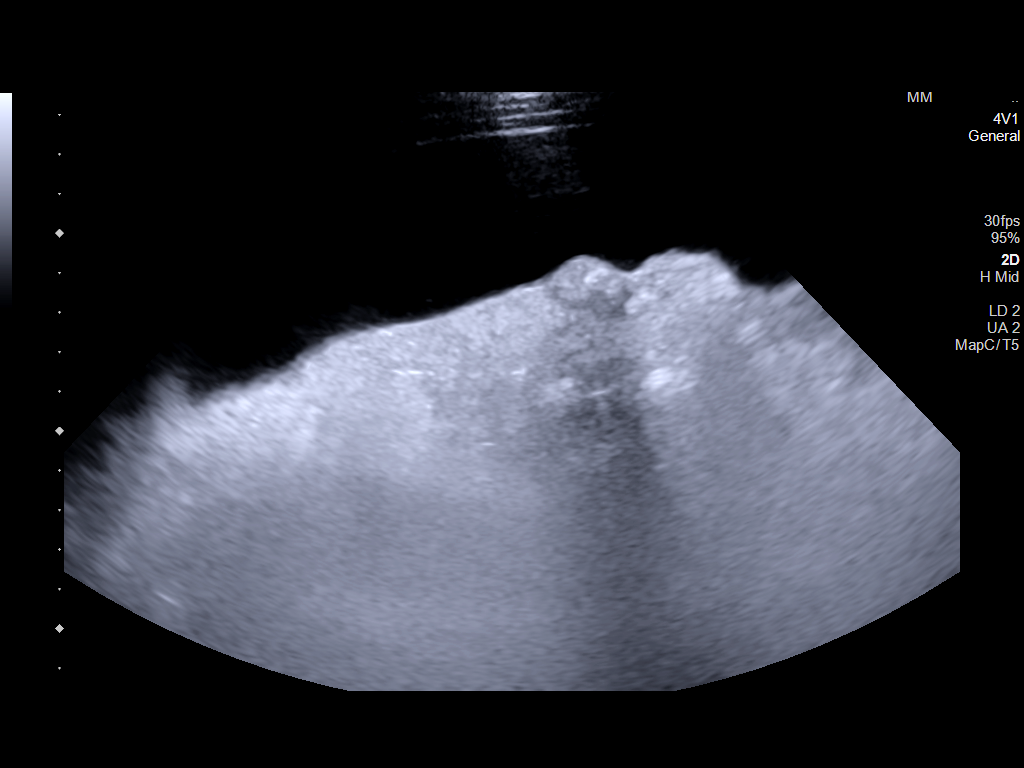
[im 2/3]
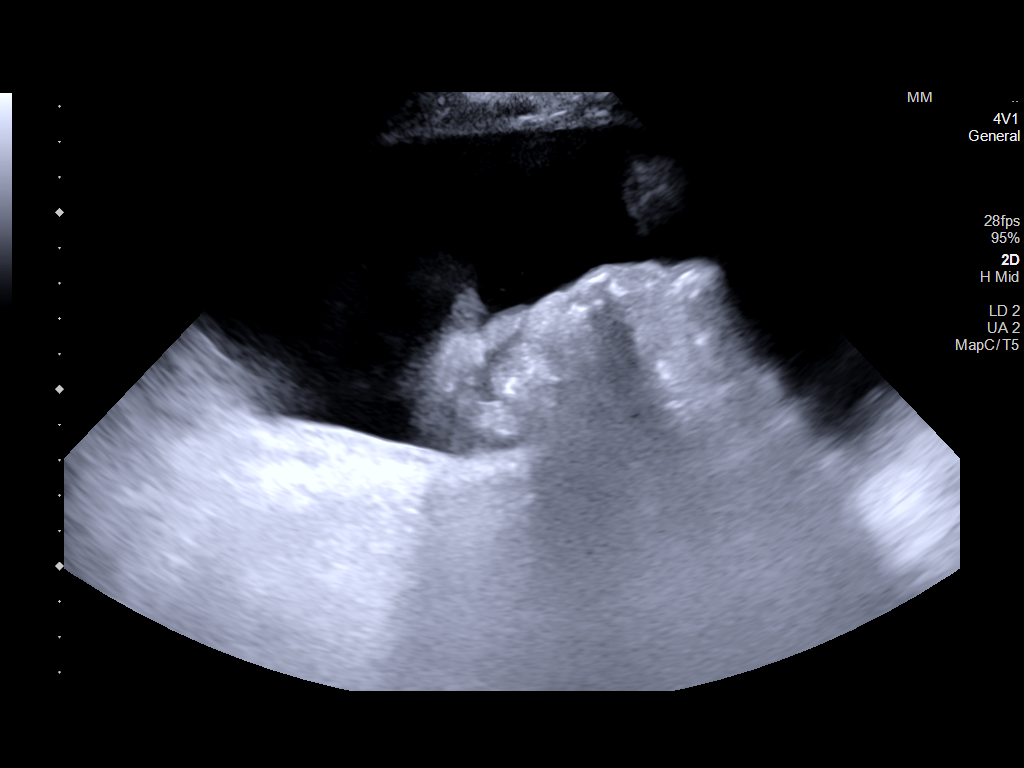
[im 3/3]
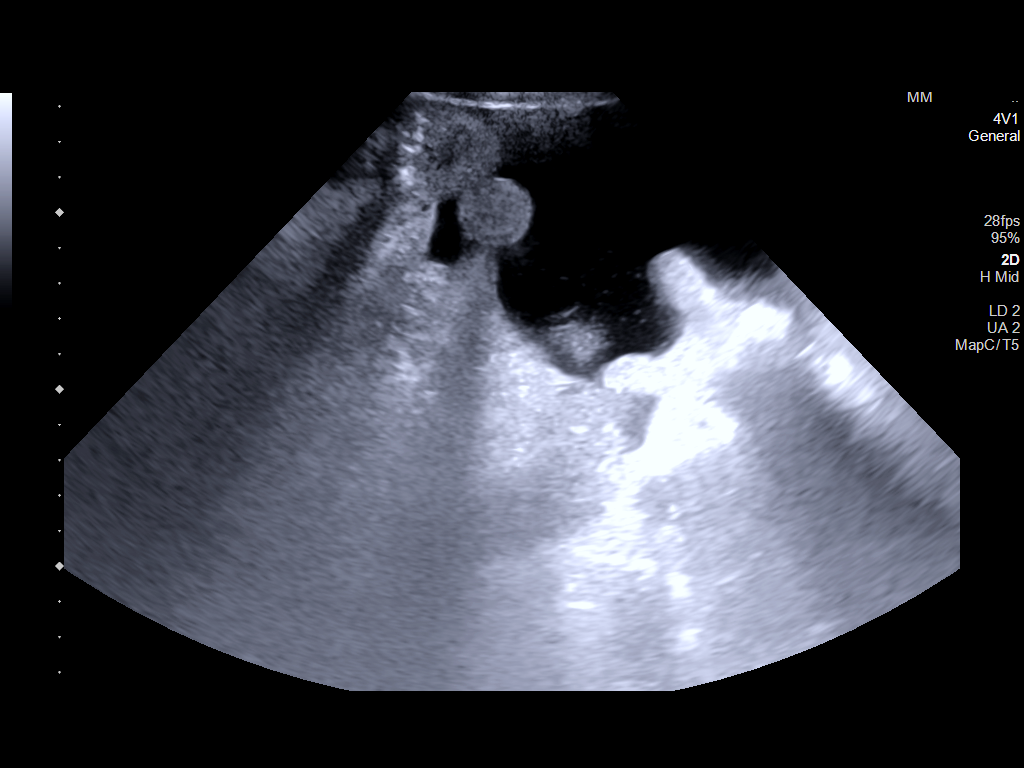

[3 of 3 positions shown; findings below may reference images not displayed]

EXAM:
ULTRASOUND GUIDED RIGHT LOWER QUADRANT PARACENTESIS

MEDICATIONS:
None

COMPLICATIONS:
None immediate

PROCEDURE:
Informed written consent was obtained from the patient after a
discussion of the risks, benefits and alternatives to treatment. A
timeout was performed prior to the initiation of the procedure.

Initial ultrasound scanning demonstrates a large amount of ascites
within the right lower abdominal quadrant. The right lower abdomen
was prepped and draped in the usual sterile fashion. 1% lidocaine
was used for local anesthesia.

Following this, a 19 gauge, 7-cm, Yueh catheter was introduced. An
ultrasound image was saved for documentation purposes. The
paracentesis was performed. The catheter was removed and a dressing
was applied. The patient tolerated the procedure well without
immediate post procedural complication.
Patient received post-procedure intravenous albumin; see nursing
notes for details.
FINDINGS: A total of approximately 3.6 L of amber fluid was removed. Samples
were sent to the laboratory as requested by the clinical team.
IMPRESSION: Successful ultrasound-guided paracentesis yielding 3.6 liters of
peritoneal fluid.

## 2019-05-30 NOTE — Progress Notes (Signed)
Paracentesis complete no signs of distress.  

## 2019-05-30 NOTE — Procedures (Addendum)
   US guided RLQ paracentesis  3.6 L yellow fluid obtained Sent for labs per MD  Tolerated well  EBL: None

## 2019-05-31 ENCOUNTER — Telehealth: Payer: Self-pay | Admitting: *Deleted

## 2019-05-31 LAB — ACID FAST SMEAR (AFB, MYCOBACTERIA): Acid Fast Smear: NEGATIVE

## 2019-05-31 LAB — CYTOLOGY - NON PAP

## 2019-05-31 NOTE — Telephone Encounter (Signed)
Patient currently being followed for ascites by GI and had paracentesis yesterday- is it still ok to schedule these follow ups from [facility] file or better to wait till patient better- please advise.

## 2019-05-31 NOTE — Telephone Encounter (Signed)
-----   Message from Carmelina Noun, LPN sent at [id number]  8:59 AM EST ----- Patient was scheduled for Repeat U/S AAA duplex (from [place] file) and U/S abdominal complete with elastography for 01/11/2019- Patient cancelled his appt and did not want to reschedule. Dr Nicki Reaper wanted to put in reminder file to revisit in April 2021.

## 2019-06-03 ENCOUNTER — Telehealth: Payer: Self-pay | Admitting: Internal Medicine

## 2019-06-03 NOTE — Telephone Encounter (Signed)
PLEASE RESEND PATIENT LAB ORDERS TO HIS HOME.  HE DID NOT RECEIVE THEM

## 2019-06-03 NOTE — Telephone Encounter (Signed)
Follow ups removed from [place] file

## 2019-06-03 NOTE — Telephone Encounter (Signed)
Spoke with pt. He received his lab orders today in the mail.

## 2019-06-03 NOTE — Telephone Encounter (Signed)
Patient had recent complete abdominal ultrasound which showed no aortic aneurysm also shows some evidence of cirrhosis has been referred to specialist May remove these test from the [place] file

## 2019-06-04 LAB — CULTURE, BODY FLUID W GRAM STAIN -BOTTLE: Culture: NO GROWTH

## 2019-06-05 ENCOUNTER — Other Ambulatory Visit: Payer: Self-pay | Admitting: Gastroenterology

## 2019-06-05 DIAGNOSIS — K7460 Unspecified cirrhosis of liver: Secondary | ICD-10-CM | POA: Diagnosis not present

## 2019-06-05 DIAGNOSIS — I10 Essential (primary) hypertension: Secondary | ICD-10-CM | POA: Diagnosis not present

## 2019-06-05 DIAGNOSIS — R188 Other ascites: Secondary | ICD-10-CM | POA: Diagnosis not present

## 2019-06-06 ENCOUNTER — Encounter: Payer: Self-pay | Admitting: Family Medicine

## 2019-06-06 LAB — HEPATIC FUNCTION PANEL
ALT: 33 IU/L (ref 0–44)
AST: 44 IU/L — ABNORMAL HIGH (ref 0–40)
Albumin: 3.7 g/dL — ABNORMAL LOW (ref 3.8–4.8)
Alkaline Phosphatase: 174 IU/L — ABNORMAL HIGH (ref 39–117)
Bilirubin Total: 0.7 mg/dL (ref 0.0–1.2)
Bilirubin, Direct: 0.26 mg/dL (ref 0.00–0.40)
Total Protein: 6.7 g/dL (ref 6.0–8.5)

## 2019-06-06 LAB — BASIC METABOLIC PANEL
BUN/Creatinine Ratio: 11 (ref 10–24)
BUN: 11 mg/dL (ref 8–27)
CO2: 24 mmol/L (ref 20–29)
Calcium: 9.3 mg/dL (ref 8.6–10.2)
Chloride: 102 mmol/L (ref 96–106)
Creatinine, Ser: 1 mg/dL (ref 0.76–1.27)
GFR calc Af Amer: 93 mL/min/{1.73_m2} (ref >59)
GFR calc non Af Amer: 80 mL/min/{1.73_m2} (ref >59)
Glucose: 73 mg/dL (ref 65–99)
Potassium: 4.1 mmol/L (ref 3.5–5.2)
Sodium: 141 mmol/L (ref 134–144)

## 2019-06-06 LAB — HEPATITIS B SURFACE ANTIGEN: Hepatitis B Surface Ag: NEGATIVE

## 2019-06-06 LAB — PROTIME-INR
INR: 1.2 (ref 0.9–1.2)
Prothrombin Time: 12.9 s — ABNORMAL HIGH (ref 9.1–12.0)

## 2019-06-06 LAB — SPECIMEN STATUS REPORT

## 2019-06-06 LAB — HEPATITIS A ANTIBODY, TOTAL: hep A Total Ab: POSITIVE — AB

## 2019-06-06 LAB — HEPATITIS B SURFACE ANTIBODY,QUALITATIVE: Hep B Surface Ab, Qual: REACTIVE

## 2019-06-07 LAB — AFP TUMOR MARKER: AFP, Serum, Tumor Marker: 8.5 ng/mL — ABNORMAL HIGH (ref 0.0–8.3)

## 2019-06-07 LAB — MITOCHONDRIAL ANTIBODIES: Mitochondrial Ab: <20 Units (ref 0.0–20.0)

## 2019-06-09 ENCOUNTER — Other Ambulatory Visit: Payer: Self-pay | Admitting: Family Medicine

## 2019-06-10 ENCOUNTER — Encounter: Payer: Self-pay | Admitting: Internal Medicine

## 2019-06-10 NOTE — Progress Notes (Signed)
Patient scheduled.

## 2019-06-11 ENCOUNTER — Other Ambulatory Visit: Payer: Self-pay | Admitting: *Deleted

## 2019-06-11 DIAGNOSIS — R772 Abnormality of alphafetoprotein: Secondary | ICD-10-CM

## 2019-06-12 ENCOUNTER — Encounter: Payer: Self-pay | Admitting: *Deleted

## 2019-06-12 NOTE — Addendum Note (Signed)
Addended by: Cheron Every on: 06/12/2019 08:59 AM   Modules accepted: Orders

## 2019-06-14 ENCOUNTER — Other Ambulatory Visit: Payer: Self-pay | Admitting: Family Medicine

## 2019-06-28 ENCOUNTER — Other Ambulatory Visit: Payer: Self-pay | Admitting: Family Medicine

## 2019-06-28 NOTE — Telephone Encounter (Signed)
May have 90-day on all of these

## 2019-07-07 ENCOUNTER — Telehealth: Payer: Self-pay | Admitting: Gastroenterology

## 2019-07-07 NOTE — Telephone Encounter (Signed)
Patient requested something to take prior to MRI due to claustrophobia (see result note).  Please let him know we typically have patient's take Valium 10mg  one hour before MRI. I see he is on Valium already. He could take one prior to MRI as outlined.

## 2019-07-08 NOTE — Telephone Encounter (Signed)
Lmom, waiting on a return call.  

## 2019-07-09 ENCOUNTER — Ambulatory Visit (HOSPITAL_COMMUNITY): Payer: PPO

## 2019-07-09 ENCOUNTER — Other Ambulatory Visit: Payer: Self-pay | Admitting: Family Medicine

## 2019-07-09 NOTE — Telephone Encounter (Signed)
Called cell, VM is full. Called HM and it rung, no VM options. Will call pt back.

## 2019-07-10 ENCOUNTER — Encounter: Payer: Self-pay | Admitting: Internal Medicine

## 2019-07-10 ENCOUNTER — Ambulatory Visit: Payer: PPO | Admitting: Gastroenterology

## 2019-07-10 ENCOUNTER — Telehealth: Payer: Self-pay | Admitting: Gastroenterology

## 2019-07-10 NOTE — Telephone Encounter (Signed)
PATIENT WAS A NO SHOW AND LETTER SENT  °

## 2019-07-13 LAB — ACID FAST CULTURE WITH REFLEXED SENSITIVITIES (MYCOBACTERIA): Acid Fast Culture: NEGATIVE

## 2019-07-18 ENCOUNTER — Other Ambulatory Visit: Payer: Self-pay | Admitting: Family Medicine

## 2019-07-18 NOTE — Telephone Encounter (Signed)
Called pt. Provided CS # to call to r/s.

## 2019-07-18 NOTE — Telephone Encounter (Signed)
Pt needs appt in next 3-4 wks before more refills.   For follow up per Dr. Bary Leriche notes.

## 2019-07-18 NOTE — Telephone Encounter (Signed)
Pt called and missed his MRI. Pt would like to r/s the MRI

## 2019-07-18 NOTE — Telephone Encounter (Signed)
Seen 05/15/19 and note states to follow up in 3 weeks - pt has not followed up and also not taking wellbutrin.

## 2019-08-06 ENCOUNTER — Other Ambulatory Visit: Payer: Self-pay | Admitting: Family Medicine

## 2019-08-08 ENCOUNTER — Ambulatory Visit (HOSPITAL_COMMUNITY)
Admission: RE | Admit: 2019-08-08 | Discharge: 2019-08-08 | Disposition: A | Discharge status: Home / Self Care | Payer: PPO | Source: Ambulatory Visit | Attending: Gastroenterology | Admitting: Gastroenterology

## 2019-08-08 ENCOUNTER — Other Ambulatory Visit: Payer: Self-pay

## 2019-08-08 DIAGNOSIS — R772 Abnormality of alphafetoprotein: Secondary | ICD-10-CM | POA: Diagnosis not present

## 2019-08-08 DIAGNOSIS — K7460 Unspecified cirrhosis of liver: Secondary | ICD-10-CM | POA: Diagnosis not present

## 2019-08-08 IMAGING — MR MR ABDOMEN WO/W CM
8 of 18 series · 17 of 48 positions shown · IV contrast (10 mL Gadavist)
Comparison: Abdominal ultrasound dated [DATE]

CLINICAL DATA: Cirrhosis, elevated LFTs, elevated AFP

EXAM:
MRI ABDOMEN WITHOUT AND WITH CONTRAST
TECHNIQUE: Multiplanar multisequence MR imaging of the abdomen was performed
both before and after the administration of intravenous contrast.
CONTRAST:  10mL GADAVIST GADOBUTROL 1 MMOL/ML IV SOLN

[Series 3: T2 · coronal · 5.0mm · 1.25mm/px · 2 of 44 slices shown (1 of 3)]
[im 1/44]
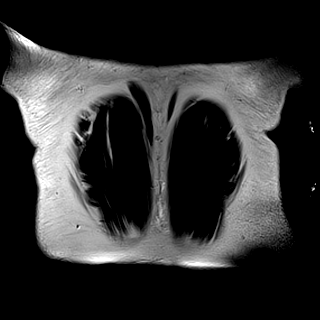
[im 44/44]
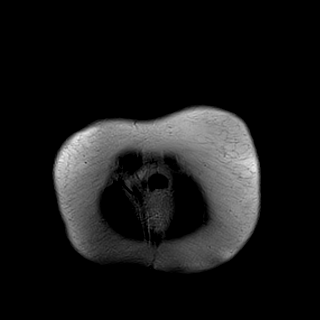

[Series 4: ax dual echo_in · axial · 4.0mm · 0.59mm/px · z∈[-137,+115]mm · 2 of 64 slices shown]
[im 1/64]
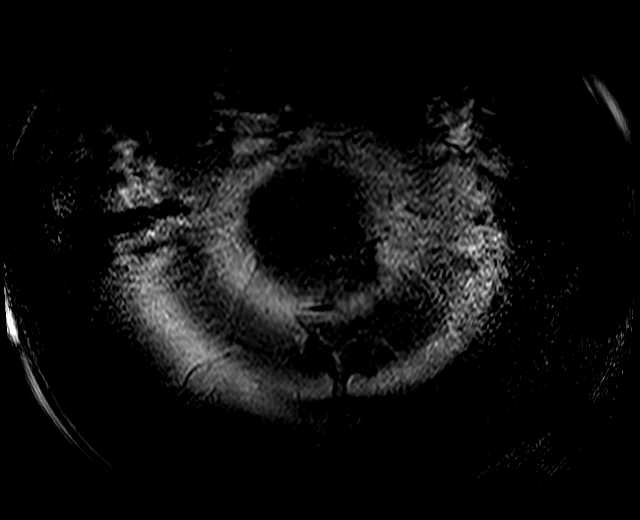
[im 64/64]
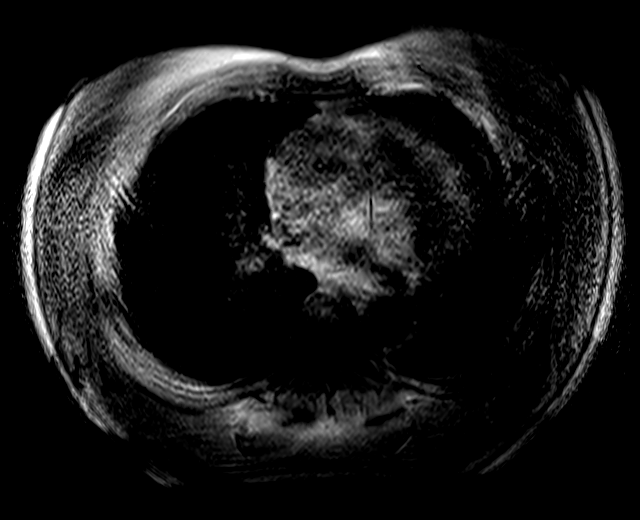

[Series 5: ax dual echo_opp · axial · 4.0mm · 0.59mm/px · z∈[-137,+115]mm · 2 of 64 slices shown]
[im 1/64]
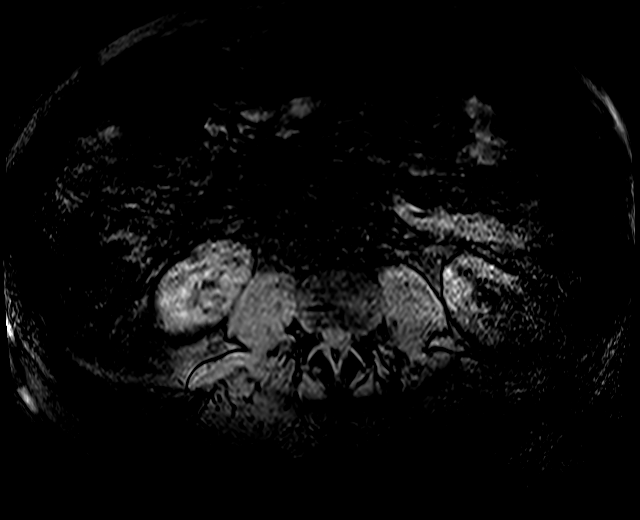
[im 64/64]
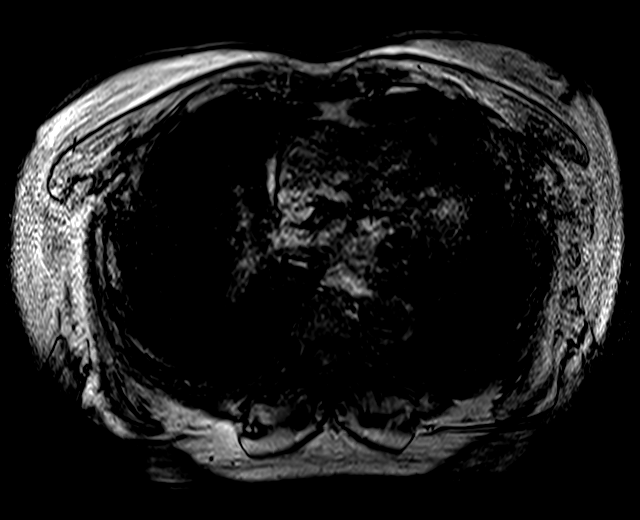

[Series 8: T2 · axial · 5.0mm · 1.10mm/px · z∈[-128,+106]mm · 2 of 40 slices shown (2 of 3)]
[im 1/40]
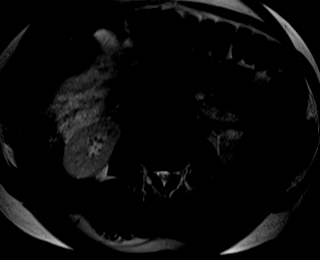
[im 40/40]
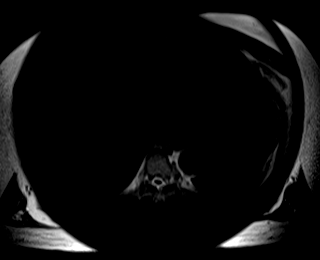

[Series 9: DWI · axial · 5.0mm · 0.99mm/px · z∈[-128,+106]mm · 3 of 78 slices shown]
[im 1/78]
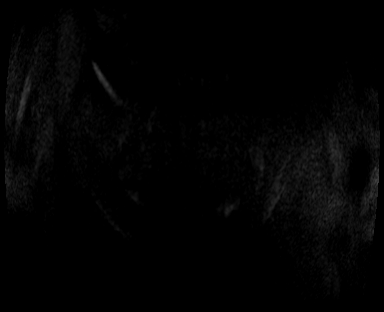
[im 39/78]
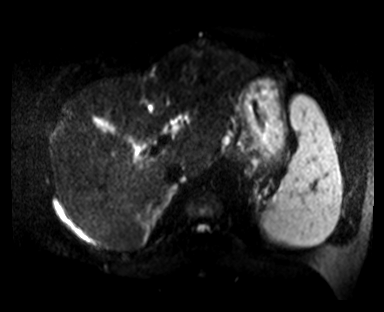
[im 78/78]
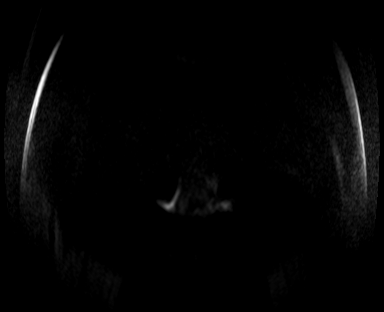

[Series 10: ax dwi_adc · axial · 5.0mm · 0.99mm/px · z∈[-128,+106]mm · 2 of 40 slices shown]
[im 1/40]
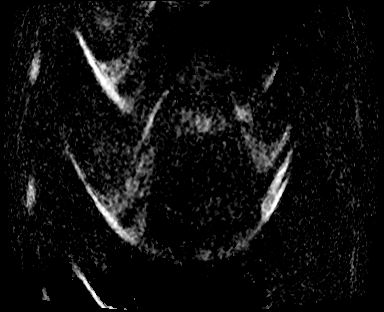
[im 40/40]
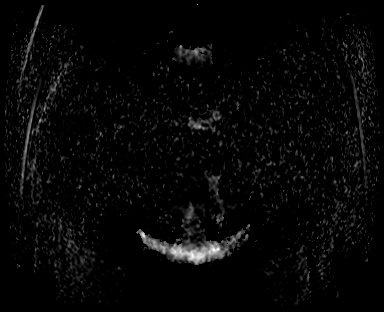

[Series 11: bSSFP · axial · 5.0mm · 0.78mm/px · z∈[-134,+111]mm · 2 of 50 slices shown]
[im 1/50]
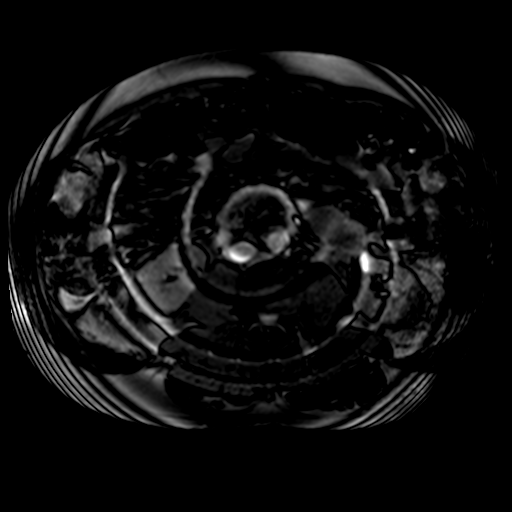
[im 50/50]
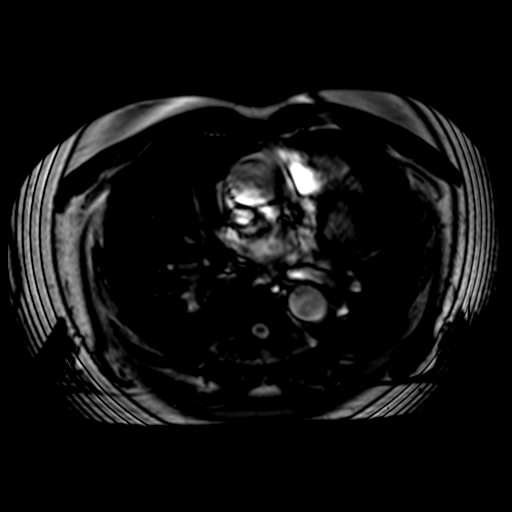

[Series 20: T2 · axial · 5.0mm · 1.45mm/px · z∈[-158,+136]mm · 2 of 50 slices shown (3 of 3)]
[im 1/50]
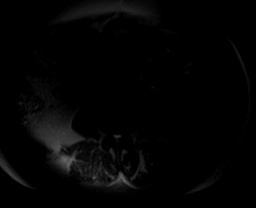
[im 50/50]
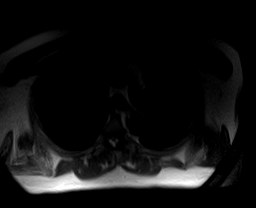

[17 of 48 positions shown; findings below may reference images not displayed]

FINDINGS: Motion degraded images.

Lower chest: Lung bases are clear.

Hepatobiliary: Cirrhosis. No suspicious/enhancing hepatic lesions.
No focal lesions on restricted diffusion.

Gallbladder is motion degraded but grossly unremarkable. No
intrahepatic or extrahepatic ductal dilatation.

Pancreas:  Within normal limits.

Spleen:  Enlarged, measuring 15.1 cm in maximal axial dimension.

Adrenals/Urinary Tract:  Adrenal glands are within normal limits.

16 mm left lower pole renal cyst (series 3/image 20). Right kidney
is within normal limits. No hydronephrosis.

Stomach/Bowel: Stomach is within normal limits.

Visualized bowel is grossly unremarkable.

Vascular/Lymphatic:  No evidence of abdominal aortic aneurysm.

Portal vein is patent.  Mild perigastric varices.

No suspicious abdominal lymphadenopathy.

Other:  Mild perihepatic ascites.

Musculoskeletal: No focal osseous lesions.
IMPRESSION: Motion degraded images.

Cirrhosis.  No findings suspicious for HCC.

Portal vein is patent. Mild perihepatic ascites. Mild splenomegaly.
Mild perigastric varices.

## 2019-08-08 MED ORDER — GADOBUTROL 1 MMOL/ML IV SOLN
10.0000 mL | Freq: Once | INTRAVENOUS | Status: AC | PRN
  Administered 2019-08-08: 10 mL via INTRAVENOUS

## 2019-08-14 ENCOUNTER — Telehealth: Payer: Self-pay | Admitting: Emergency Medicine

## 2019-08-14 NOTE — Telephone Encounter (Signed)
Pt called to inquire about mri results. notified him that ll is out of the office until 6/14. Pt wanted to know if we could notify him sooner than that about the test

## 2019-08-14 NOTE — Telephone Encounter (Signed)
Will defer this to Randall Hiss to address tomorrow as he should be covering [facility]s box tomorrow.

## 2019-08-14 NOTE — Telephone Encounter (Signed)
Routed previous message to LSL. Re-routine to [facility] [employer]

## 2019-08-15 ENCOUNTER — Encounter (HOSPITAL_COMMUNITY): Payer: Self-pay

## 2019-08-15 NOTE — Telephone Encounter (Signed)
See result note.  

## 2019-08-16 ENCOUNTER — Other Ambulatory Visit: Payer: Self-pay | Admitting: Family Medicine

## 2019-08-16 NOTE — Telephone Encounter (Signed)
Sent in prescription recommend follow-up office visit in the summer

## 2019-08-18 ENCOUNTER — Other Ambulatory Visit: Payer: Self-pay | Admitting: Family Medicine

## 2019-08-20 ENCOUNTER — Encounter: Payer: Self-pay | Admitting: Internal Medicine

## 2019-08-20 ENCOUNTER — Other Ambulatory Visit: Payer: Self-pay

## 2019-08-20 DIAGNOSIS — K7460 Unspecified cirrhosis of liver: Secondary | ICD-10-CM

## 2019-08-21 ENCOUNTER — Other Ambulatory Visit: Payer: Self-pay | Admitting: *Deleted

## 2019-08-21 ENCOUNTER — Encounter: Payer: Self-pay | Admitting: Internal Medicine

## 2019-08-21 ENCOUNTER — Telehealth: Payer: Self-pay | Admitting: Internal Medicine

## 2019-08-21 MED ORDER — DIAZEPAM 10 MG PO TABS: 10.0000 mg | ORAL_TABLET | Freq: Two times a day (BID) | ORAL | 2 refills | Status: DC | PRN

## 2019-08-21 NOTE — Telephone Encounter (Signed)
Technically four stages of cirrhosis. Based on the information we have so far on him, he would be stage 1 or 2. Cannot delineate without EGD to screen for varices.  His liver function shows MELD Na score of 8 (lowest or best score you can have is a 6) and his Child Pugh Class is an A (best you can have). These are very good indicators that even though his liver has scar tissue it is still functioning well at this time.   Patient still needs follow up ov (missed 07/2019 ov). Please schedule.

## 2019-08-21 NOTE — Telephone Encounter (Signed)
PLEASE CALL PATIENT, HE HAS QUESTIONS ABOUT HIS MRI RESULTS

## 2019-08-21 NOTE — Telephone Encounter (Signed)
Sent patient letter stating he needed to call and make appointment

## 2019-08-21 NOTE — Telephone Encounter (Signed)
Spoke with pt. Pt notified of levels of cirrhosis, meld score, child pugh class. Pt is aware that his liver is functioning well.

## 2019-08-21 NOTE — Telephone Encounter (Signed)
Spoke with pt. Pt would like to know what stage of cirrhosis is he in and how many stages of cirrhosis are there?

## 2019-09-03 ENCOUNTER — Telehealth: Payer: Self-pay | Admitting: Family Medicine

## 2019-09-03 ENCOUNTER — Other Ambulatory Visit: Payer: Self-pay | Admitting: *Deleted

## 2019-09-03 MED ORDER — VALACYCLOVIR HCL 1 G PO TABS: 1000.0000 mg | ORAL_TABLET | Freq: Three times a day (TID) | ORAL | 0 refills | Status: DC

## 2019-09-03 NOTE — Telephone Encounter (Signed)
Patient would like some Valtrex called in for fever blisters.  [facility]  [phone]

## 2019-09-03 NOTE — Telephone Encounter (Signed)
Valtrex Valtrex 1000 mg 1 pill taken 3 times daily for 5 days #15

## 2019-09-03 NOTE — Telephone Encounter (Signed)
Lmtc

## 2019-09-03 NOTE — Telephone Encounter (Signed)
Patient states he has had fever blisters in the past but not often. This one has been bothering him for about 4 days and he hasn't had any luck with over the counter stuff in the past. [facility]

## 2019-09-11 ENCOUNTER — Other Ambulatory Visit: Payer: Self-pay | Admitting: Family Medicine

## 2019-09-13 ENCOUNTER — Other Ambulatory Visit: Payer: Self-pay | Admitting: Family Medicine

## 2019-09-25 ENCOUNTER — Other Ambulatory Visit: Payer: Self-pay | Admitting: Family Medicine

## 2019-10-12 ENCOUNTER — Other Ambulatory Visit: Payer: Self-pay | Admitting: Family Medicine

## 2019-10-16 ENCOUNTER — Other Ambulatory Visit: Payer: Self-pay

## 2019-10-16 DIAGNOSIS — K7460 Unspecified cirrhosis of liver: Secondary | ICD-10-CM

## 2019-10-31 ENCOUNTER — Other Ambulatory Visit: Payer: Self-pay | Admitting: Family Medicine

## 2019-10-31 NOTE — Telephone Encounter (Signed)
lvm to schedule appt.  

## 2019-11-04 NOTE — Telephone Encounter (Signed)
Scheduled 9/27

## 2019-11-08 ENCOUNTER — Other Ambulatory Visit: Payer: Self-pay | Admitting: Family Medicine

## 2019-11-10 NOTE — Telephone Encounter (Signed)
Patient needs lipid, liver, metabolic 7, PSA, G8P Diabetes, hyperlipidemia, elevated liver enzymes, screening prostate May have 90-day on medicine

## 2019-11-12 ENCOUNTER — Other Ambulatory Visit: Payer: Self-pay | Admitting: Family Medicine

## 2019-11-13 ENCOUNTER — Other Ambulatory Visit: Payer: Self-pay | Admitting: Family Medicine

## 2019-11-13 DIAGNOSIS — E119 Type 2 diabetes mellitus without complications: Secondary | ICD-10-CM

## 2019-11-13 DIAGNOSIS — E7849 Other hyperlipidemia: Secondary | ICD-10-CM

## 2019-11-13 DIAGNOSIS — Z79899 Other long term (current) drug therapy: Secondary | ICD-10-CM

## 2019-11-13 DIAGNOSIS — R748 Abnormal levels of other serum enzymes: Secondary | ICD-10-CM

## 2019-11-13 DIAGNOSIS — Z125 Encounter for screening for malignant neoplasm of prostate: Secondary | ICD-10-CM

## 2019-11-25 ENCOUNTER — Other Ambulatory Visit: Payer: Self-pay | Admitting: Family Medicine

## 2019-11-26 ENCOUNTER — Other Ambulatory Visit: Payer: Self-pay | Admitting: Family Medicine

## 2019-11-28 DIAGNOSIS — Z79899 Other long term (current) drug therapy: Secondary | ICD-10-CM | POA: Diagnosis not present

## 2019-11-28 DIAGNOSIS — E7849 Other hyperlipidemia: Secondary | ICD-10-CM | POA: Diagnosis not present

## 2019-11-28 DIAGNOSIS — K7460 Unspecified cirrhosis of liver: Secondary | ICD-10-CM | POA: Diagnosis not present

## 2019-11-28 DIAGNOSIS — Z125 Encounter for screening for malignant neoplasm of prostate: Secondary | ICD-10-CM | POA: Diagnosis not present

## 2019-11-28 DIAGNOSIS — E119 Type 2 diabetes mellitus without complications: Secondary | ICD-10-CM | POA: Diagnosis not present

## 2019-11-28 DIAGNOSIS — R748 Abnormal levels of other serum enzymes: Secondary | ICD-10-CM | POA: Diagnosis not present

## 2019-11-29 LAB — BASIC METABOLIC PANEL
BUN/Creatinine Ratio: 11 (ref 10–24)
BUN: 16 mg/dL (ref 8–27)
CO2: 25 mmol/L (ref 20–29)
Calcium: 9.9 mg/dL (ref 8.6–10.2)
Chloride: 99 mmol/L (ref 96–106)
Creatinine, Ser: 1.43 mg/dL — ABNORMAL HIGH (ref 0.76–1.27)
GFR calc Af Amer: 60 mL/min/{1.73_m2} (ref >59)
GFR calc non Af Amer: 52 mL/min/{1.73_m2} — ABNORMAL LOW (ref >59)
Glucose: 196 mg/dL — ABNORMAL HIGH (ref 65–99)
Potassium: 4.4 mmol/L (ref 3.5–5.2)
Sodium: 139 mmol/L (ref 134–144)

## 2019-11-29 LAB — COMPREHENSIVE METABOLIC PANEL
ALT: 36 IU/L (ref 0–44)
AST: 33 IU/L (ref 0–40)
Albumin/Globulin Ratio: 1.2 (ref 1.2–2.2)
Albumin: 4.1 g/dL (ref 3.8–4.8)
Alkaline Phosphatase: 123 IU/L — ABNORMAL HIGH (ref 44–121)
BUN/Creatinine Ratio: 13 (ref 10–24)
BUN: 17 mg/dL (ref 8–27)
Bilirubin Total: 1.1 mg/dL (ref 0.0–1.2)
CO2: 26 mmol/L (ref 20–29)
Calcium: 10 mg/dL (ref 8.6–10.2)
Chloride: 100 mmol/L (ref 96–106)
Creatinine, Ser: 1.31 mg/dL — ABNORMAL HIGH (ref 0.76–1.27)
GFR calc Af Amer: 67 mL/min/{1.73_m2} (ref >59)
GFR calc non Af Amer: 58 mL/min/{1.73_m2} — ABNORMAL LOW (ref >59)
Globulin, Total: 3.4 g/dL (ref 1.5–4.5)
Glucose: 201 mg/dL — ABNORMAL HIGH (ref 65–99)
Potassium: 4.5 mmol/L (ref 3.5–5.2)
Sodium: 139 mmol/L (ref 134–144)
Total Protein: 7.5 g/dL (ref 6.0–8.5)

## 2019-11-29 LAB — PROTIME-INR
INR: 1.2 (ref 0.9–1.2)
Prothrombin Time: 12.6 s — ABNORMAL HIGH (ref 9.1–12.0)

## 2019-11-29 LAB — HEPATIC FUNCTION PANEL
ALT: 35 IU/L (ref 0–44)
AST: 36 IU/L (ref 0–40)
Albumin: 4 g/dL (ref 3.8–4.8)
Alkaline Phosphatase: 121 IU/L (ref 44–121)
Bilirubin Total: 1 mg/dL (ref 0.0–1.2)
Bilirubin, Direct: 0.32 mg/dL (ref 0.00–0.40)
Total Protein: 7.7 g/dL (ref 6.0–8.5)

## 2019-11-29 LAB — AFP TUMOR MARKER: AFP, Serum, Tumor Marker: 7.4 ng/mL (ref 0.0–8.3)

## 2019-11-29 LAB — LIPID PANEL
Chol/HDL Ratio: 4.9 ratio (ref 0.0–5.0)
Cholesterol, Total: 162 mg/dL (ref 100–199)
HDL: 33 mg/dL — ABNORMAL LOW (ref >39)
LDL Chol Calc (NIH): 97 mg/dL (ref 0–99)
Triglycerides: 185 mg/dL — ABNORMAL HIGH (ref 0–149)
VLDL Cholesterol Cal: 32 mg/dL (ref 5–40)

## 2019-11-29 LAB — HEMOGLOBIN A1C
Est. average glucose Bld gHb Est-mCnc: 246 mg/dL
Hgb A1c MFr Bld: 10.2 % — ABNORMAL HIGH (ref 4.8–5.6)

## 2019-11-29 LAB — PSA: Prostate Specific Ag, Serum: 0.5 ng/mL (ref 0.0–4.0)

## 2019-12-02 ENCOUNTER — Other Ambulatory Visit: Payer: Self-pay

## 2019-12-02 ENCOUNTER — Encounter: Payer: Self-pay | Admitting: Family Medicine

## 2019-12-02 ENCOUNTER — Ambulatory Visit (INDEPENDENT_AMBULATORY_CARE_PROVIDER_SITE_OTHER): Payer: PPO | Admitting: Family Medicine

## 2019-12-02 VITALS — BP 114/74 | HR 65 | Temp 97.4°F | Wt 220.2 lb

## 2019-12-02 DIAGNOSIS — E7849 Other hyperlipidemia: Secondary | ICD-10-CM

## 2019-12-02 DIAGNOSIS — I10 Essential (primary) hypertension: Secondary | ICD-10-CM

## 2019-12-02 DIAGNOSIS — F411 Generalized anxiety disorder: Secondary | ICD-10-CM | POA: Diagnosis not present

## 2019-12-02 DIAGNOSIS — E1165 Type 2 diabetes mellitus with hyperglycemia: Secondary | ICD-10-CM | POA: Diagnosis not present

## 2019-12-02 MED ORDER — METOPROLOL SUCCINATE ER 50 MG PO TB24: 50.0000 mg | ORAL_TABLET | Freq: Every day | ORAL | 1 refills | Status: DC

## 2019-12-02 MED ORDER — PANTOPRAZOLE SODIUM 40 MG PO TBEC
40.0000 mg | DELAYED_RELEASE_TABLET | Freq: Every day | ORAL | 1 refills | Status: DC
Start: 2019-12-02 — End: 2020-04-23

## 2019-12-02 MED ORDER — AMLODIPINE BESYLATE 5 MG PO TABS: 5.0000 mg | ORAL_TABLET | Freq: Every day | ORAL | 1 refills | Status: DC

## 2019-12-02 MED ORDER — LISINOPRIL 40 MG PO TABS
40.0000 mg | ORAL_TABLET | Freq: Every day | ORAL | 1 refills | Status: DC
Start: 2019-12-02 — End: 2020-05-11

## 2019-12-02 MED ORDER — CANAGLIFLOZIN 300 MG PO TABS: 300.0000 mg | ORAL_TABLET | Freq: Every day | ORAL | 2 refills | Status: DC

## 2019-12-02 MED ORDER — SPIRONOLACTONE 50 MG PO TABS
50.0000 mg | ORAL_TABLET | Freq: Every day | ORAL | 1 refills | Status: DC
Start: 2019-12-02 — End: 2020-05-11

## 2019-12-02 MED ORDER — VENLAFAXINE HCL ER 150 MG PO CP24: 150.0000 mg | ORAL_CAPSULE | Freq: Two times a day (BID) | ORAL | 1 refills | Status: DC

## 2019-12-02 MED ORDER — METFORMIN HCL 1000 MG PO TABS
1000.0000 mg | ORAL_TABLET | Freq: Two times a day (BID) | ORAL | 5 refills | Status: DC
Start: 2019-12-02 — End: 2020-04-23

## 2019-12-02 MED ORDER — DIAZEPAM 5 MG PO TABS: ORAL_TABLET | ORAL | 5 refills | Status: DC

## 2019-12-02 MED ORDER — FUROSEMIDE 20 MG PO TABS
40.0000 mg | ORAL_TABLET | Freq: Every day | ORAL | 1 refills | Status: DC
Start: 2019-12-02 — End: 2020-05-11

## 2019-12-02 NOTE — Progress Notes (Signed)
Subjective:    Patient ID: Shawn [name]., male    DOB: 07/03/1955, 64 y.o.   MRN: [id number]  Hypertension This is a chronic problem. Pertinent negatives include no chest pain, headaches or shortness of breath. Treatments tried: amlodipine, lisinopril. There are no compliance problems.   Essential hypertension, benign - Plan: Basic metabolic panel, Microalbumin / creatinine urine ratio  Uncontrolled type 2 diabetes mellitus with hyperglycemia ([place]) - Plan: Microalbumin / creatinine urine ratio  Other hyperlipidemia   Results for orders placed or performed in visit on 11/13/19  Lipid Profile  Result Value Ref Range   Cholesterol, Total 162 100 - 199 mg/dL   Triglycerides 185 (H) 0 - 149 mg/dL   HDL 33 (L) >39 mg/dL   VLDL Cholesterol Cal 32 5 - 40 mg/dL   LDL Chol Calc (NIH) 97 0 - 99 mg/dL   Chol/HDL Ratio 4.9 0.0 - 5.0 ratio  Hepatic function panel  Result Value Ref Range   Total Protein 7.7 6.0 - 8.5 g/dL   Albumin 4.0 3.8 - 4.8 g/dL   Bilirubin Total 1.0 0.0 - 1.2 mg/dL   Bilirubin, Direct 0.32 0.00 - 0.40 mg/dL   Alkaline Phosphatase 121 44 - 121 IU/L   AST 36 0 - 40 IU/L   ALT 35 0 - 44 IU/L  Basic Metabolic Panel (BMET)  Result Value Ref Range   Glucose 196 (H) 65 - 99 mg/dL   BUN 16 8 - 27 mg/dL   Creatinine, Ser 1.43 (H) 0.76 - 1.27 mg/dL   GFR calc non Af Amer 52 (L) >59 mL/min/1.73   GFR calc Af Amer 60 >59 mL/min/1.73   BUN/Creatinine Ratio 11 10 - 24   Sodium 139 134 - 144 mmol/L   Potassium 4.4 3.5 - 5.2 mmol/L   Chloride 99 96 - 106 mmol/L   CO2 25 20 - 29 mmol/L   Calcium 9.9 8.6 - 10.2 mg/dL  PSA  Result Value Ref Range   Prostate Specific Ag, Serum 0.5 0.0 - 4.0 ng/mL  Hemoglobin A1c  Result Value Ref Range   Hgb A1c MFr Bld 10.2 (H) 4.8 - 5.6 %   Est. average glucose Bld gHb Est-mCnc 246 mg/dL      Review of Systems  Constitutional: Negative for diaphoresis and fatigue.  HENT: Negative for congestion and rhinorrhea.   Respiratory:  Negative for cough and shortness of breath.   Cardiovascular: Negative for chest pain and leg swelling.  Gastrointestinal: Negative for abdominal pain and diarrhea.  Skin: Negative for color change and rash.  Neurological: Negative for dizziness and headaches.  Psychiatric/Behavioral: Negative for behavioral problems and confusion.       Objective:   Physical Exam Vitals reviewed.  Constitutional:      General: He is not in acute distress. HENT:     Head: Normocephalic and atraumatic.  Eyes:     General:        Right eye: No discharge.        Left eye: No discharge.  Neck:     Trachea: No tracheal deviation.  Cardiovascular:     Rate and Rhythm: Normal rate and regular rhythm.     Heart sounds: Normal heart sounds. No murmur heard.   Pulmonary:     Effort: Pulmonary effort is normal. No respiratory distress.     Breath sounds: Normal breath sounds.  Lymphadenopathy:     Cervical: No cervical adenopathy.  Skin:    General: Skin is warm and  dry.  Neurological:     Mental Status: He is alert.     Coordination: Coordination normal.  Psychiatric:        Behavior: Behavior normal.           Assessment & Plan:  1. Essential hypertension, benign I am concerned about the patient's creatinine I am encouraged him to be well-hydrated when he checks it again continue his medication minimize salt in the diet.  Also get diabetes under better control to help with kidney function he will check lab work - Basic metabolic panel - Microalbumin / creatinine urine ratio  2. Uncontrolled type 2 diabetes mellitus with hyperglycemia (HCC) Very important for the patient to do better we will go ahead and add Invokana and repeat kidney function again in 2 to 3 weeks time.  I warned patient about possibility of low sugars in to avoid prolonged fasting Certainly if this medicine does not work out or if kidney function rises we will choose a different approach - Microalbumin / creatinine urine  ratio  3. Other hyperlipidemia Continue cholesterol medicine labs reviewed with patient  4. Morbid obesity ([place]) Watch portions stay physically active try to lose weight,ed Patient also has underlying liver disease but is being followed by gastroenterology.  Patient does have underlying anxiety and depression related issues but he states his depression is doing good anxiety takes 1 or 2 tablets a day of Valium I been encouraged him to reduce this to a 5 mg tablet minute when possible try to just do 1/day if necessary 2 a day he states that it does not cause any drowsiness  Follow-up in 4 months

## 2019-12-09 ENCOUNTER — Telehealth: Payer: Self-pay | Admitting: *Deleted

## 2019-12-09 ENCOUNTER — Telehealth: Payer: Self-pay

## 2019-12-09 NOTE — Telephone Encounter (Signed)
Pt states he has not started the invokana yet because he read he has to take at exactly the same time of day and before he eats. Sometimes he eats breaksfast as early as 5 am and he does not know if he takes glipizide at the same time or later in the day. States it will be hard for him to take invokana at exactly the same time every day

## 2019-12-09 NOTE — Telephone Encounter (Signed)
[place] wants a nurse to call him he has a question about new medication Dr Nicki Reaper put him on for diabetes. He does not know the name of medication.   Pt [phone]

## 2019-12-09 NOTE — Telephone Encounter (Signed)
Discussed with pt and pt states he is going to try and take the med and let us know if any problems.

## 2019-12-09 NOTE — Telephone Encounter (Signed)
So ideally the medication is taken within 1 hour of the usual time-if he feels he cannot do that then let me know and we will stop that medicine

## 2019-12-11 NOTE — Telephone Encounter (Signed)
error 

## 2019-12-27 ENCOUNTER — Other Ambulatory Visit: Payer: Self-pay | Admitting: Family Medicine

## 2019-12-30 ENCOUNTER — Other Ambulatory Visit: Payer: Self-pay

## 2019-12-30 ENCOUNTER — Telehealth: Payer: Self-pay

## 2019-12-30 MED ORDER — ONETOUCH VERIO VI STRP: ORAL_STRIP | 0 refills | Status: DC

## 2019-12-30 MED ORDER — ONETOUCH DELICA PLUS LANCET33G MISC: 0 refills | Status: DC

## 2019-12-30 NOTE — Telephone Encounter (Signed)
Patient is needing refill on needles and stripes for Actu-Check machine called into [facility]

## 2019-12-30 NOTE — Telephone Encounter (Signed)
Refills sent to pharmacy and pt is aware  

## 2020-01-29 ENCOUNTER — Other Ambulatory Visit: Payer: Self-pay | Admitting: Family Medicine

## 2020-02-04 ENCOUNTER — Telehealth: Payer: Self-pay | Admitting: Internal Medicine

## 2020-02-04 NOTE — Telephone Encounter (Signed)
Letter mailed

## 2020-02-04 NOTE — Telephone Encounter (Signed)
RECALL FOR ULTRASOUND 

## 2020-02-11 ENCOUNTER — Telehealth: Payer: Self-pay | Admitting: Internal Medicine

## 2020-02-11 DIAGNOSIS — K7460 Unspecified cirrhosis of liver: Secondary | ICD-10-CM

## 2020-02-11 NOTE — Addendum Note (Signed)
Addended by: Hassan Rowan on: 02/11/2020 03:14 PM   Modules accepted: Orders

## 2020-02-11 NOTE — Telephone Encounter (Signed)
[place] abd RUQ scheduled for 02/20/20 at 8:30am, arrive at 8:15am. NPO after midnight before test.  Tried to call pt, LMOVM to inform him of [place] appt. Letter mailed.

## 2020-02-11 NOTE — Telephone Encounter (Signed)
Patient received letter to schedule ultrasound, sldo wants to know if he is supposed to have follow up labs

## 2020-02-12 ENCOUNTER — Other Ambulatory Visit: Payer: Self-pay | Admitting: Family Medicine

## 2020-02-12 NOTE — Telephone Encounter (Signed)
Lmom, waiting on a return call.  

## 2020-02-12 NOTE — Telephone Encounter (Signed)
Spoke with pt. No current lab work is needed at this time. Last labs were completed in 12/2019.

## 2020-02-20 ENCOUNTER — Other Ambulatory Visit: Payer: Self-pay

## 2020-02-20 ENCOUNTER — Ambulatory Visit (HOSPITAL_COMMUNITY)
Admission: RE | Admit: 2020-02-20 | Discharge: 2020-02-20 | Disposition: A | Discharge status: Home / Self Care | Payer: PPO | Source: Ambulatory Visit | Attending: Gastroenterology | Admitting: Gastroenterology

## 2020-02-20 DIAGNOSIS — K8020 Calculus of gallbladder without cholecystitis without obstruction: Secondary | ICD-10-CM | POA: Diagnosis not present

## 2020-02-20 DIAGNOSIS — K7460 Unspecified cirrhosis of liver: Secondary | ICD-10-CM | POA: Diagnosis not present

## 2020-02-20 IMAGING — US US ABDOMEN LIMITED
1 series · 14 of 25 positions shown · non-contrast
Comparison: [DATE] and prior.

CLINICAL DATA: cirrhosis

EXAM:
ULTRASOUND ABDOMEN LIMITED RIGHT UPPER QUADRANT

[Series 1: us abdomen limited · 0.19mm/px · 14 of 52 slices shown]
[im 1/52]
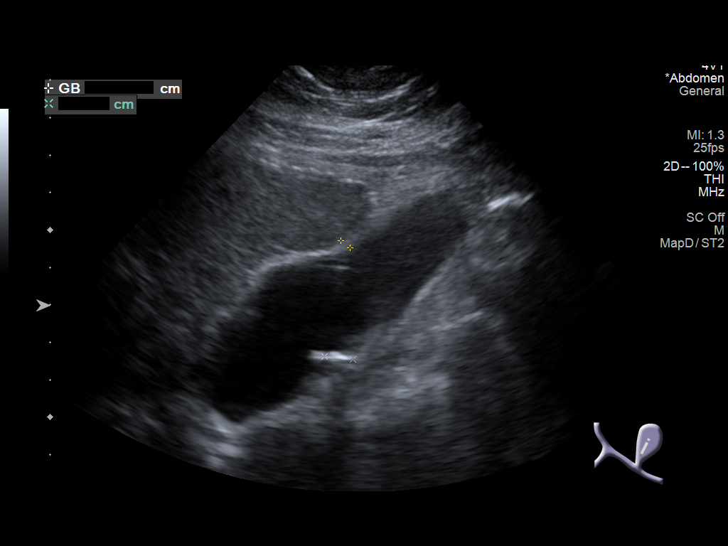
[im 5/52]
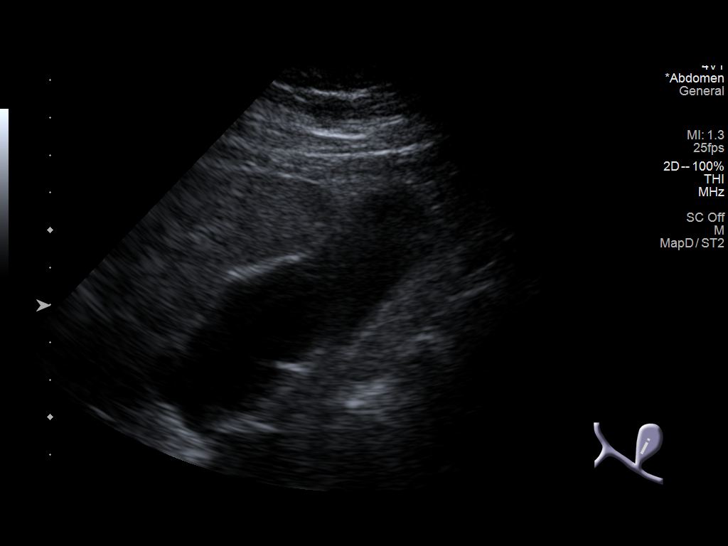
[im 9/52]
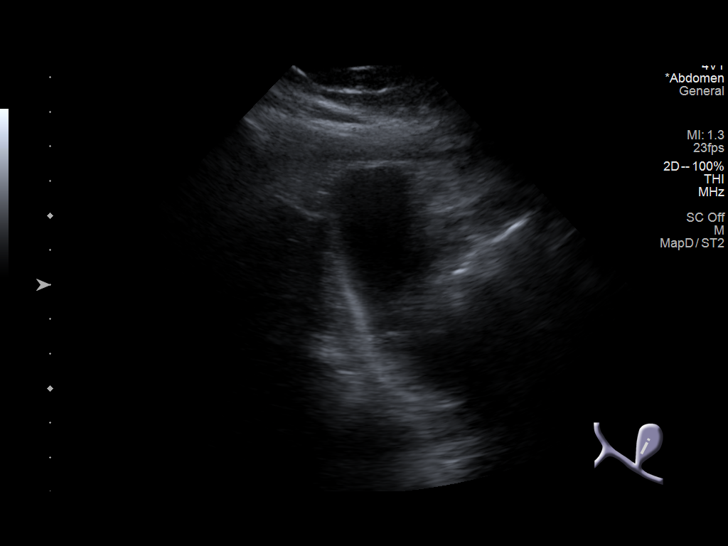
[im 13/52]
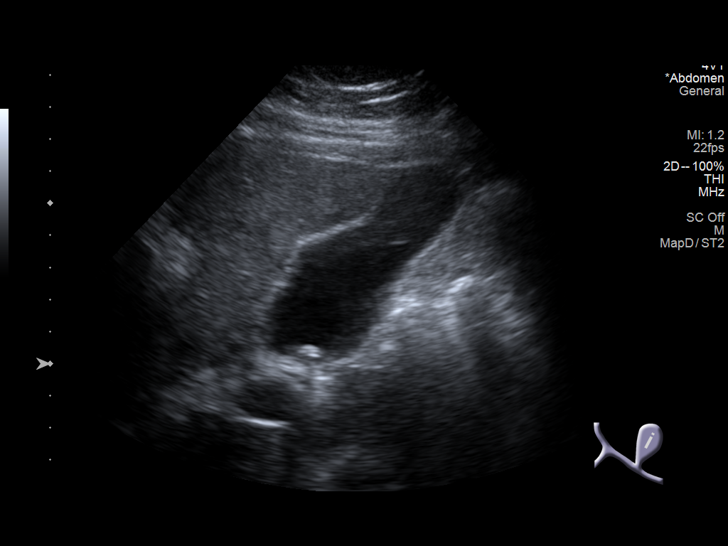
[im 18/52]
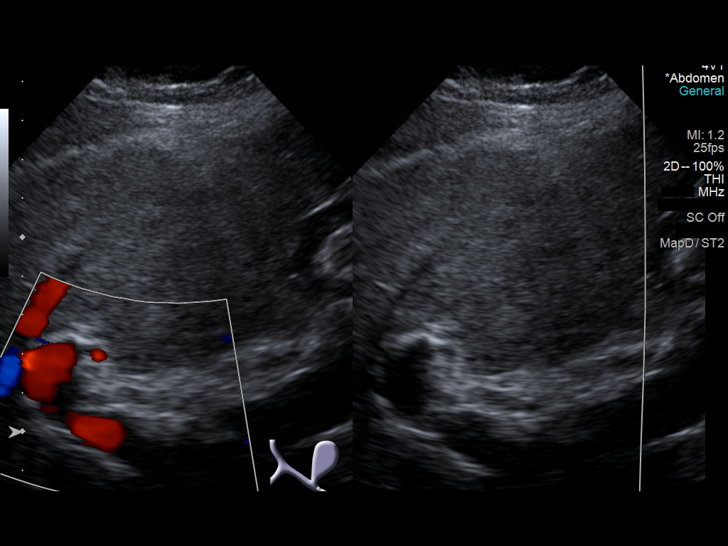
[im 20/52]
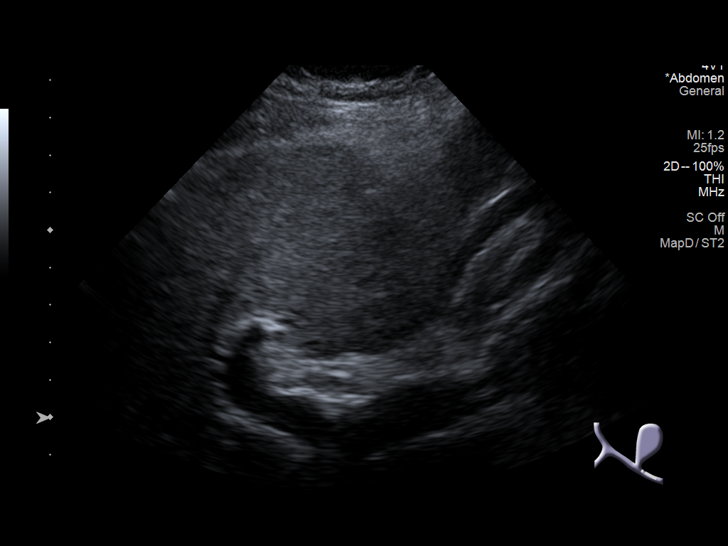
[im 24/52]
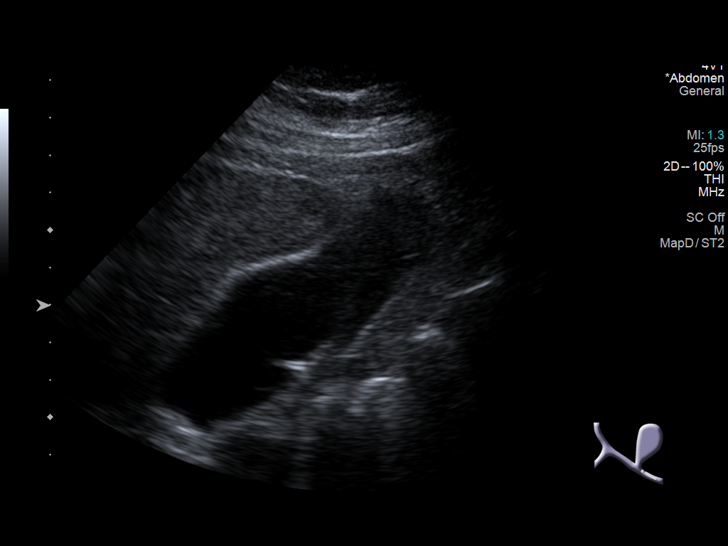
[im 28/52]
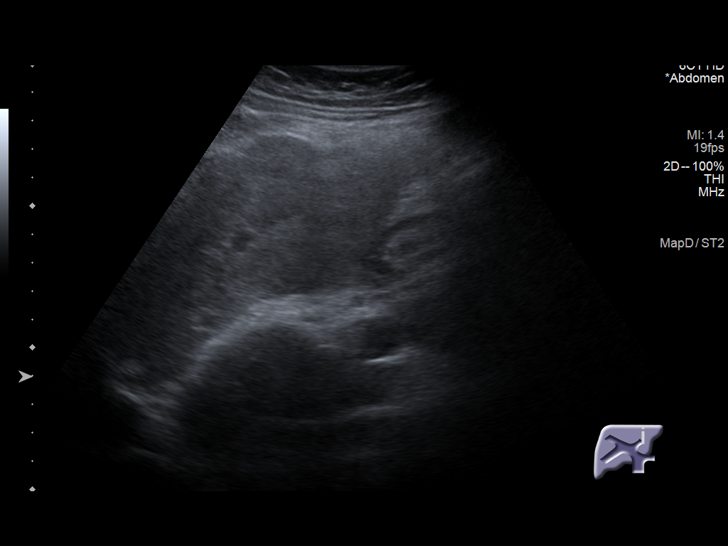
[im 32/52]
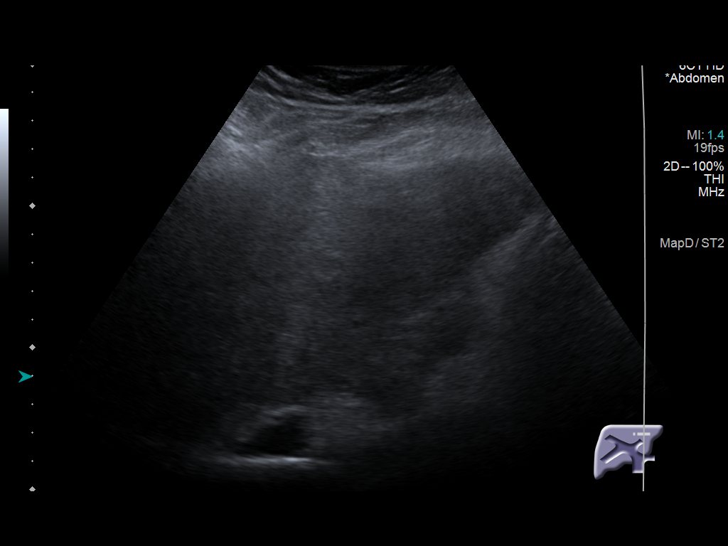
[im 35/52]
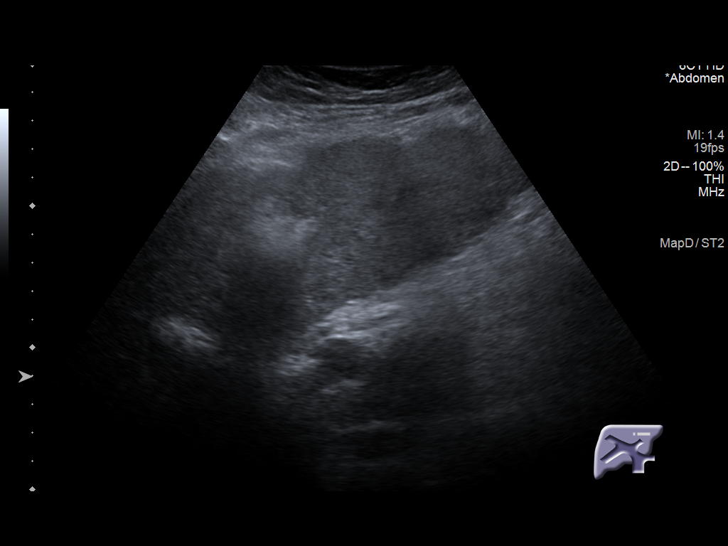
[im 39/52]
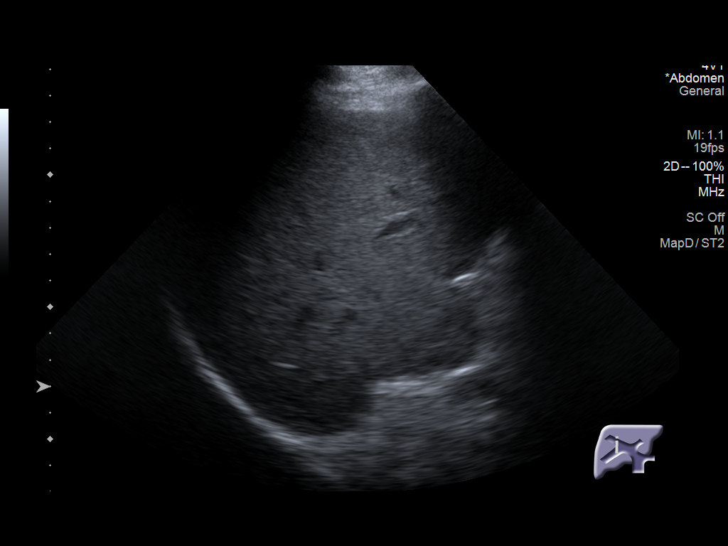
[im 43/52]
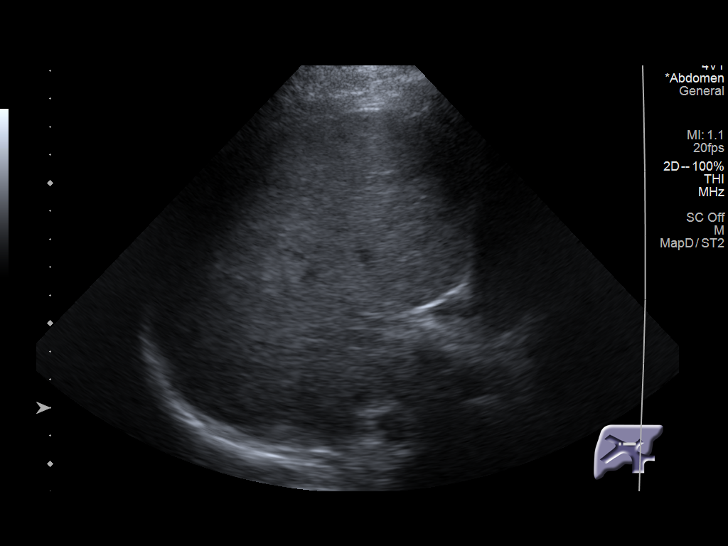
[im 47/52]
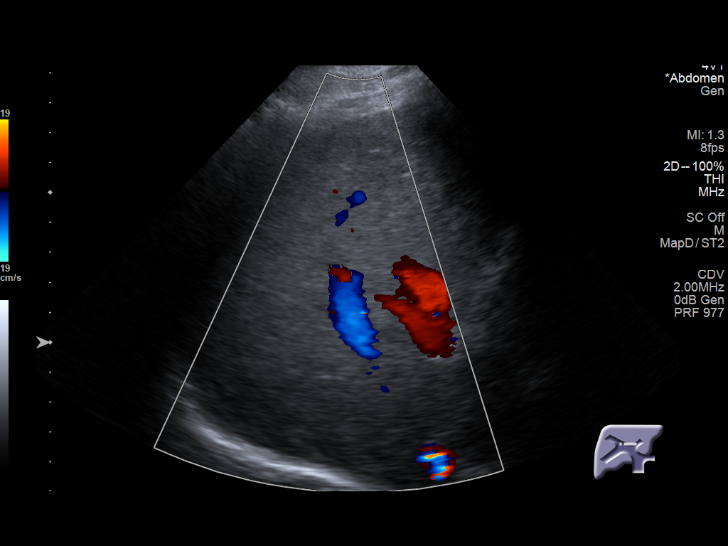
[im 52/52]
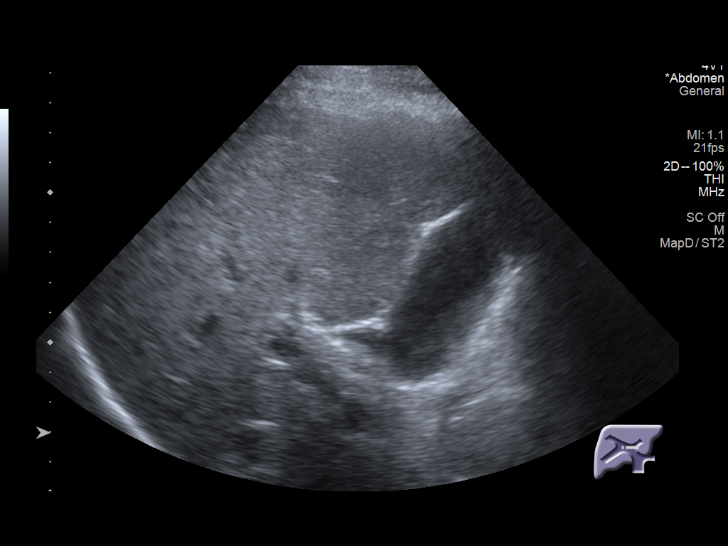

[14 of 25 positions shown; findings below may reference images not displayed]

FINDINGS: Gallbladder:

No wall thickening visualized. Intraluminal calculi measuring up to
8 mm. No sonographic Murphy sign noted by sonographer.

Common bile duct:

Diameter: 5 mm

Liver:

No focal lesion identified. Heterogenous, echogenic parenchyma with
lobular contour. Portal vein is patent on color Doppler imaging with
normal direction of blood flow towards the liver.

Other: None.
IMPRESSION: Cirrhotic liver morphology.  No focal hepatic lesion.

Cholelithiasis.

## 2020-02-27 ENCOUNTER — Other Ambulatory Visit: Payer: Self-pay | Admitting: Family Medicine

## 2020-03-05 ENCOUNTER — Telehealth: Payer: Self-pay

## 2020-03-05 NOTE — Telephone Encounter (Signed)
Returned the pt's call, and advised him of his results of U/S. Pt states he will call back to schedule a appt to be evaluated and to discuss next step in his gallstone treatment.

## 2020-03-08 NOTE — Progress Notes (Deleted)
Primary Care Physician: [name], MD  Primary Gastroenterologist:  Garfield Cornea, MD   No chief complaint on file.   HPI: Shawn Bugaj. is a 65 y.o. male here for follow up. H/O cirrhosis felt to be due to NASH. Developed ascites and lower extremity edema in early 2021. He required abdominal paracentesis in 05/2019. No SBP. Cytology negative. AMA negative. AFP 8.5 slightly elevated. MELD Na 8. Immune to Hep A and B. MRI performed with no evidence of hepatoma.   Repeat labs in 11/2019: AFP down to 7.4 normal range. MELD Na 11 due to increased creatinine. RUQ U/S for hepatoma screening 02/2020: cirrhosis, no hepatoma. Cholelithiasis.   Still needs EGD for esophageal variceal screening. No prior colonoscopy.  Current Outpatient Medications  Medication Sig Dispense Refill  . amLODipine (NORVASC) 5 MG tablet Take 1 tablet (5 mg total) by mouth daily. 90 tablet 1  . ascorbic acid (VITAMIN C) 500 MG tablet Take 500 mg by mouth daily.    Marland Kitchen aspirin EC 81 MG tablet Take 81 mg by mouth daily.    . blood glucose meter kit and supplies KIT Dispense based on patient and insurance preference. Use daily as directed. (FOR ICD - 10 E11.9) 1 each 5  . Cyanocobalamin (B-12 PO) Take by mouth daily.    . diazepam (VALIUM) 5 MG tablet Take 1 tablet twice per day prn 60 tablet 5  . furosemide (LASIX) 20 MG tablet Take 2 tablets (40 mg total) by mouth daily. 180 tablet 1  . glipiZIDE (GLUCOTROL XL) 5 MG 24 hr tablet TAKE 1 TABLET BY MOUTH EVERY DAY 90 tablet 0  . glucose blood (ONETOUCH VERIO) test strip Use as instructed 100 strip 0  . INVOKANA 300 MG TABS tablet TAKE 1 TABLET (300 MG TOTAL) BY MOUTH DAILY BEFORE BREAKFAST. 30 tablet 2  . Lancets (ONETOUCH DELICA PLUS [id number]) MISC USE TO OBTAIN A BLOOD SPECIMENT OT TEST BLOOD SUGAR DAILY 100 each 0  . lisinopril (ZESTRIL) 40 MG tablet Take 1 tablet (40 mg total) by mouth daily. 90 tablet 1  . metFORMIN (GLUCOPHAGE) 1000 MG tablet Take 1  tablet (1,000 mg total) by mouth 2 (two) times daily. 60 tablet 5  . metoprolol succinate (TOPROL-XL) 50 MG 24 hr tablet Take 1 tablet (50 mg total) by mouth daily. with food 90 tablet 1  . NON FORMULARY Cordyceps 5105m daily    . Omega-3 Fatty Acids (FISH OIL) 1200 MG CAPS Take by mouth daily.    .Marland KitchenOVER THE COUNTER MEDICATION Active mind 1004mdaily    . pantoprazole (PROTONIX) 40 MG tablet Take 1 tablet (40 mg total) by mouth daily. 90 tablet 1  . potassium chloride SA (KLOR-CON M20) 20 MEQ tablet Take one tablet po twice a day 180 tablet 1  . pravastatin (PRAVACHOL) 80 MG tablet TAKE 1 TABLET BY MOUTH EVERY DAY 90 tablet 0  . spironolactone (ALDACTONE) 50 MG tablet Take 1 tablet (50 mg total) by mouth daily. 90 tablet 1  . venlafaxine XR (EFFEXOR-XR) 150 MG 24 hr capsule Take 1 capsule (150 mg total) by mouth 2 (two) times daily. 180 capsule 1  . zinc gluconate 50 MG tablet Take 50 mg by mouth daily.     No current facility-administered medications for this visit.    Allergies as of 03/09/2020  . (No Known Allergies)    ROS:  General: Negative for anorexia, weight loss, fever, chills, fatigue, weakness. ENT: Negative for  hoarseness, difficulty swallowing , nasal congestion. CV: Negative for chest pain, angina, palpitations, dyspnea on exertion, peripheral edema.  Respiratory: Negative for dyspnea at rest, dyspnea on exertion, cough, sputum, wheezing.  GI: See history of present illness. GU:  Negative for dysuria, hematuria, urinary incontinence, urinary frequency, nocturnal urination.  Endo: Negative for unusual weight change.    Physical Examination:   There were no vitals taken for this visit.  General: Well-nourished, well-developed in no acute distress.  Eyes: No icterus. Mouth: Oropharyngeal mucosa moist and pink , no lesions erythema or exudate. Lungs: Clear to auscultation bilaterally.  Heart: Regular rate and rhythm, no murmurs rubs or gallops.  Abdomen: Bowel sounds  are normal, nontender, nondistended, no hepatosplenomegaly or masses, no abdominal bruits or hernia , no rebound or guarding.   Extremities: No lower extremity edema. No clubbing or deformities. Neuro: Alert and oriented x 4   Skin: Warm and dry, no jaundice.   Psych: Alert and cooperative, normal mood and affect.  Labs:  Lab Results  Component Value Date   CREATININE 1.43 (H) 11/28/2019   BUN 16 11/28/2019   NA 139 11/28/2019   K 4.4 11/28/2019   CL 99 11/28/2019   CO2 25 11/28/2019   Lab Results  Component Value Date   ALT 35 11/28/2019   AST 36 11/28/2019   ALKPHOS 121 11/28/2019   BILITOT 1.0 11/28/2019   Lab Results  Component Value Date   WBC 5.5 05/14/2019   HGB 15.5 05/14/2019   HCT 47.0 05/14/2019   MCV 94 05/14/2019   PLT 151 05/14/2019   Lab Results  Component Value Date   INR 1.2 11/28/2019   INR 1.2 06/05/2019   INR 1.2 05/29/2017     Imaging Studies: US Abdomen Limited RUQ (LIVER/GB)  Result Date: 02/20/2020 CLINICAL DATA:  cirrhosis EXAM: ULTRASOUND ABDOMEN LIMITED RIGHT UPPER QUADRANT COMPARISON:  08/08/2019 and prior. FINDINGS: Gallbladder: No wall thickening visualized. Intraluminal calculi measuring up to 8 mm. No sonographic Murphy sign noted by sonographer. Common bile duct: Diameter: 5 mm Liver: No focal lesion identified. Heterogenous, echogenic parenchyma with lobular contour. Portal vein is patent on color Doppler imaging with normal direction of blood flow towards the liver. Other: None. IMPRESSION: Cirrhotic liver morphology.  No focal hepatic lesion. Cholelithiasis. Electronically Signed   By: Primitivo Gauze M.D.   On: 02/20/2020 14:41

## 2020-03-09 ENCOUNTER — Ambulatory Visit: Payer: PPO | Admitting: Gastroenterology

## 2020-03-30 ENCOUNTER — Other Ambulatory Visit: Payer: Self-pay | Admitting: Family Medicine

## 2020-03-30 MED ORDER — ONETOUCH VERIO VI STRP
ORAL_STRIP | 0 refills | Status: DC
Start: 2020-03-30 — End: 2022-04-12

## 2020-03-30 NOTE — Addendum Note (Signed)
Addended by: Patsy Lager on: 03/30/2020 02:20 PM   Modules accepted: Orders

## 2020-04-08 ENCOUNTER — Telehealth: Payer: Self-pay | Admitting: Internal Medicine

## 2020-04-08 ENCOUNTER — Ambulatory Visit: Payer: PPO | Admitting: Gastroenterology

## 2020-04-08 NOTE — Telephone Encounter (Signed)
Patient no show x5

## 2020-04-08 NOTE — Telephone Encounter (Signed)
Noted  

## 2020-04-22 ENCOUNTER — Other Ambulatory Visit: Payer: Self-pay | Admitting: Family Medicine

## 2020-04-22 NOTE — Telephone Encounter (Signed)
Needs visit and then route back. Also pt is on amlodipine 5mg  not 10mg 

## 2020-04-23 NOTE — Telephone Encounter (Signed)
Scheduled appointment 3/1 for medication follow up

## 2020-04-28 ENCOUNTER — Other Ambulatory Visit: Payer: Self-pay | Admitting: Family Medicine

## 2020-05-05 ENCOUNTER — Ambulatory Visit: Payer: PPO | Admitting: Family Medicine

## 2020-05-11 ENCOUNTER — Encounter: Payer: Self-pay | Admitting: Family Medicine

## 2020-05-11 ENCOUNTER — Ambulatory Visit (INDEPENDENT_AMBULATORY_CARE_PROVIDER_SITE_OTHER): Payer: PPO | Admitting: Family Medicine

## 2020-05-11 ENCOUNTER — Other Ambulatory Visit: Payer: Self-pay

## 2020-05-11 VITALS — BP 96/70 | Temp 98.0°F | Ht 66.0 in | Wt 219.4 lb

## 2020-05-11 DIAGNOSIS — K7460 Unspecified cirrhosis of liver: Secondary | ICD-10-CM

## 2020-05-11 DIAGNOSIS — E1165 Type 2 diabetes mellitus with hyperglycemia: Secondary | ICD-10-CM | POA: Diagnosis not present

## 2020-05-11 DIAGNOSIS — R21 Rash and other nonspecific skin eruption: Secondary | ICD-10-CM

## 2020-05-11 DIAGNOSIS — E1122 Type 2 diabetes mellitus with diabetic chronic kidney disease: Secondary | ICD-10-CM | POA: Diagnosis not present

## 2020-05-11 DIAGNOSIS — I10 Essential (primary) hypertension: Secondary | ICD-10-CM

## 2020-05-11 DIAGNOSIS — N1831 Chronic kidney disease, stage 3a: Secondary | ICD-10-CM

## 2020-05-11 DIAGNOSIS — F324 Major depressive disorder, single episode, in partial remission: Secondary | ICD-10-CM

## 2020-05-11 MED ORDER — CANAGLIFLOZIN 300 MG PO TABS: 300.0000 mg | ORAL_TABLET | Freq: Every day | ORAL | 5 refills | Status: DC

## 2020-05-11 MED ORDER — GLIPIZIDE ER 5 MG PO TB24: 5.0000 mg | ORAL_TABLET | Freq: Every day | ORAL | 0 refills | Status: DC

## 2020-05-11 MED ORDER — TRIAMCINOLONE ACETONIDE 0.1 % EX CREA: TOPICAL_CREAM | CUTANEOUS | 4 refills | Status: DC

## 2020-05-11 MED ORDER — SPIRONOLACTONE 50 MG PO TABS: 50.0000 mg | ORAL_TABLET | Freq: Every day | ORAL | 1 refills | Status: DC

## 2020-05-11 MED ORDER — DIAZEPAM 5 MG PO TABS: ORAL_TABLET | ORAL | 5 refills | Status: DC

## 2020-05-11 MED ORDER — LISINOPRIL 10 MG PO TABS: 10.0000 mg | ORAL_TABLET | Freq: Every day | ORAL | 1 refills | Status: DC

## 2020-05-11 MED ORDER — PRAVASTATIN SODIUM 80 MG PO TABS: 80.0000 mg | ORAL_TABLET | Freq: Every day | ORAL | 1 refills | Status: DC

## 2020-05-11 MED ORDER — AMLODIPINE BESYLATE 5 MG PO TABS: 5.0000 mg | ORAL_TABLET | Freq: Every day | ORAL | 1 refills | Status: DC

## 2020-05-11 MED ORDER — FUROSEMIDE 20 MG PO TABS: 40.0000 mg | ORAL_TABLET | Freq: Every day | ORAL | 1 refills | Status: DC

## 2020-05-11 MED ORDER — POTASSIUM CHLORIDE CRYS ER 20 MEQ PO TBCR: EXTENDED_RELEASE_TABLET | ORAL | 1 refills | Status: DC

## 2020-05-11 MED ORDER — VENLAFAXINE HCL ER 150 MG PO CP24: 150.0000 mg | ORAL_CAPSULE | Freq: Two times a day (BID) | ORAL | 1 refills | Status: DC

## 2020-05-11 NOTE — Patient Instructions (Signed)
You can get Moderna as your 2nd shot  Please do your labs  Recheck in 4 to 6 months please Diabetes Mellitus and Nutrition, Adult When you have diabetes, or diabetes mellitus, it is very important to have healthy eating habits because your blood sugar (glucose) levels are greatly affected by what you eat and drink. Eating healthy foods in the right amounts, at about the same times every day, can help you:  Control your blood glucose.  Lower your risk of heart disease.  Improve your blood pressure.  Reach or maintain a healthy weight. What can affect my meal plan? Every person with diabetes is different, and each person has different needs for a meal plan. Your health care provider may recommend that you work with a dietitian to make a meal plan that is best for you. Your meal plan may vary depending on factors such as:  The calories you need.  The medicines you take.  Your weight.  Your blood glucose, blood pressure, and cholesterol levels.  Your activity level.  Other health conditions you have, such as heart or kidney disease. How do carbohydrates affect me? Carbohydrates, also called carbs, affect your blood glucose level more than any other type of food. Eating carbs naturally raises the amount of glucose in your blood. Carb counting is a method for keeping track of how many carbs you eat. Counting carbs is important to keep your blood glucose at a healthy level, especially if you use insulin or take certain oral diabetes medicines. It is important to know how many carbs you can safely have in each meal. This is different for every person. Your dietitian can help you calculate how many carbs you should have at each meal and for each snack. How does alcohol affect me? Alcohol can cause a sudden decrease in blood glucose (hypoglycemia), especially if you use insulin or take certain oral diabetes medicines. Hypoglycemia can be a life-threatening condition. Symptoms of hypoglycemia,  such as sleepiness, dizziness, and confusion, are similar to symptoms of having too much alcohol.  Do not drink alcohol if: ? Your health care provider tells you not to drink. ? You are pregnant, may be pregnant, or are planning to become pregnant.  If you drink alcohol: ? Do not drink on an empty stomach. ? Limit how much you use to:  0-1 drink a day for women.  0-2 drinks a day for men. ? Be aware of how much alcohol is in your drink. In the [place] one drink equals one 12 oz bottle of beer (355 mL), one 5 oz glass of wine (148 mL), or one 1 oz glass of hard liquor (44 mL). ? Keep yourself hydrated with water, diet soda, or unsweetened iced tea.  Keep in mind that regular soda, juice, and other mixers may contain a lot of sugar and must be counted as carbs. What are tips for following this plan? Reading food labels  Start by checking the serving size on the "Nutrition Facts" label of packaged foods and drinks. The amount of calories, carbs, fats, and other nutrients listed on the label is based on one serving of the item. Many items contain more than one serving per package.  Check the total grams (g) of carbs in one serving. You can calculate the number of servings of carbs in one serving by dividing the total carbs by 15. For example, if a food has 30 g of total carbs per serving, it would be equal to 2 servings of  carbs.  Check the number of grams (g) of saturated fats and trans fats in one serving. Choose foods that have a low amount or none of these fats.  Check the number of milligrams (mg) of salt (sodium) in one serving. Most people should limit total sodium intake to less than 2,300 mg per day.  Always check the nutrition information of foods labeled as "low-fat" or "nonfat." These foods may be higher in added sugar or refined carbs and should be avoided.  Talk to your dietitian to identify your daily goals for nutrients listed on the label. Shopping  Avoid buying canned,  pre-made, or processed foods. These foods tend to be high in fat, sodium, and added sugar.  Shop around the outside edge of the grocery store. This is where you will most often find fresh fruits and vegetables, bulk grains, fresh meats, and fresh dairy. Cooking  Use low-heat cooking methods, such as baking, instead of high-heat cooking methods like deep frying.  Cook using healthy oils, such as olive, canola, or sunflower oil.  Avoid cooking with butter, cream, or high-fat meats. Meal planning  Eat meals and snacks regularly, preferably at the same times every day. Avoid going long periods of time without eating.  Eat foods that are high in fiber, such as fresh fruits, vegetables, beans, and whole grains. Talk with your dietitian about how many servings of carbs you can eat at each meal.  Eat 4-6 oz (112-168 g) of lean protein each day, such as lean meat, chicken, fish, eggs, or tofu. One ounce (oz) of lean protein is equal to: ? 1 oz (28 g) of meat, chicken, or fish. ? 1 egg. ?  cup (62 g) of tofu.  Eat some foods each day that contain healthy fats, such as avocado, nuts, seeds, and fish.   What foods should I eat? Fruits Berries. Apples. Oranges. Peaches. Apricots. Plums. Grapes. Mango. Papaya. Pomegranate. Kiwi. Cherries. Vegetables Lettuce. Spinach. Leafy greens, including kale, chard, collard greens, and mustard greens. Beets. Cauliflower. Cabbage. Broccoli. Carrots. Green beans. Tomatoes. Peppers. Onions. Cucumbers. Brussels sprouts. Grains Whole grains, such as whole-wheat or whole-grain bread, crackers, tortillas, cereal, and pasta. Unsweetened oatmeal. Quinoa. Brown or wild rice. Meats and other proteins Seafood. Poultry without skin. Lean cuts of poultry and beef. Tofu. Nuts. Seeds. Dairy Low-fat or fat-free dairy products such as milk, yogurt, and cheese. The items listed above may not be a complete list of foods and beverages you can eat. Contact a dietitian for more  information. What foods should I avoid? Fruits Fruits canned with syrup. Vegetables Canned vegetables. Frozen vegetables with butter or cream sauce. Grains Refined white flour and flour products such as bread, pasta, snack foods, and cereals. Avoid all processed foods. Meats and other proteins Fatty cuts of meat. Poultry with skin. Breaded or fried meats. Processed meat. Avoid saturated fats. Dairy Full-fat yogurt, cheese, or milk. Beverages Sweetened drinks, such as soda or iced tea. The items listed above may not be a complete list of foods and beverages you should avoid. Contact a dietitian for more information. Questions to ask a health care provider  Do I need to meet with a diabetes educator?  Do I need to meet with a dietitian?  What number can I call if I have questions?  When are the best times to check my blood glucose? Where to find more information:  American Diabetes Association: diabetes.org  Academy of Nutrition and Dietetics: www.eatright.[employer] of Diabetes and Digestive and  Kidney Diseases: [employer]  Association of Diabetes Care and Education Specialists: www.diabeteseducator.org Summary  It is important to have healthy eating habits because your blood sugar (glucose) levels are greatly affected by what you eat and drink.  A healthy meal plan will help you control your blood glucose and maintain a healthy lifestyle.  Your health care provider may recommend that you work with a dietitian to make a meal plan that is best for you.  Keep in mind that carbohydrates (carbs) and alcohol have immediate effects on your blood glucose levels. It is important to count carbs and to use alcohol carefully. This information is not intended to replace advice given to you by your health care provider. Make sure you discuss any questions you have with your health care provider. Document Revised: 01/29/2019 Document Reviewed: 01/29/2019 Elsevier  Patient Education  2021 [employer].

## 2020-05-11 NOTE — Progress Notes (Signed)
   Subjective:    Patient ID: Shawn [name]., male    DOB: [birth year], 65 y.o.   MRN: [id number]  Hypertension This is a chronic problem. The current episode started more than 1 year ago. Pertinent negatives include no chest pain, headaches or shortness of breath. Risk factors for coronary artery disease include dyslipidemia, diabetes mellitus and male gender. Treatments tried: norvasc, lisinopril and metoprolol.   Rash on upper chest for few months The rash is only on the upper chest.  Does not go down the arms.  No wheezing or difficulty breathing.  States urination is frequent thanks his sugars are anywhere between 100 and 200  Essential hypertension, benign  Rash  Uncontrolled type 2 diabetes mellitus with hyperglycemia ([place])  Type 2 diabetes mellitus with stage 3a chronic kidney disease, without long-term current use of insulin (HCC)   Review of Systems  Constitutional: Negative for activity change, fatigue and fever.  HENT: Negative for congestion and rhinorrhea.   Respiratory: Negative for cough and shortness of breath.   Cardiovascular: Negative for chest pain and leg swelling.  Gastrointestinal: Negative for abdominal pain, diarrhea and nausea.  Genitourinary: Negative for dysuria and hematuria.  Neurological: Negative for weakness and headaches.  Psychiatric/Behavioral: Negative for agitation and behavioral problems.       Objective:   Physical Exam Vitals reviewed.  Constitutional:      Appearance: He is well-nourished.  Cardiovascular:     Rate and Rhythm: Normal rate and regular rhythm.     Heart sounds: Normal heart sounds. No murmur heard.   Pulmonary:     Effort: Pulmonary effort is normal.     Breath sounds: Normal breath sounds.  Musculoskeletal:        General: No edema.  Lymphadenopathy:     Cervical: No cervical adenopathy.  Neurological:     Mental Status: He is alert.  Psychiatric:        Behavior: Behavior normal.             Assessment & Plan:  1. Essential hypertension, benign Blood pressure is on the low side therefore we will adjust downward Medication instead of lisinopril 40 mg we will utilize lisinopril 10 mg - Lipid panel - Hepatic function panel  2. Rash Excoriated rash on the upper chest try triamcinolone if that does not adequately take care of the problem next step would be dermatology  3. Uncontrolled type 2 diabetes mellitus with hyperglycemia (HCC) Check A1c patient having frequent urination probably related to combination of medication and high sugars  Patient inquired about continuous glucose monitoring will see if his insurance will cover this - Hemoglobin A1c - Hepatic function panel - Basic metabolic panel  4. Type 2 diabetes mellitus with stage 3a chronic kidney disease, without long-term current use of insulin ([place]) Recheck kidney function avoid all NSAIDs healthy eating  5. Cirrhosis, non-alcoholic ([place]) Liver stable as best as the patient does.  Follow-up with GI on a regular basis  Morbid obesity watch diet stay active continue medications.  Weight associated with cirrhosis as well as diabetes.  Depression partial remission was doing some counseling.  We discussed continuing his medication follow-up if any ongoing troubles  Recheck in 4 months

## 2020-05-12 ENCOUNTER — Other Ambulatory Visit: Payer: Self-pay | Admitting: Family Medicine

## 2020-05-12 DIAGNOSIS — E1165 Type 2 diabetes mellitus with hyperglycemia: Secondary | ICD-10-CM

## 2020-05-12 DIAGNOSIS — I10 Essential (primary) hypertension: Secondary | ICD-10-CM

## 2020-05-12 DIAGNOSIS — N1831 Chronic kidney disease, stage 3a: Secondary | ICD-10-CM

## 2020-05-12 LAB — BASIC METABOLIC PANEL
BUN/Creatinine Ratio: 15 (ref 10–24)
BUN: 26 mg/dL (ref 8–27)
CO2: 20 mmol/L (ref 20–29)
Calcium: 10 mg/dL (ref 8.6–10.2)
Chloride: 100 mmol/L (ref 96–106)
Creatinine, Ser: 1.75 mg/dL — ABNORMAL HIGH (ref 0.76–1.27)
Glucose: 165 mg/dL — ABNORMAL HIGH (ref 65–99)
Potassium: 5 mmol/L (ref 3.5–5.2)
Sodium: 137 mmol/L (ref 134–144)
eGFR: 43 mL/min/{1.73_m2} — ABNORMAL LOW (ref >59)

## 2020-05-12 LAB — LIPID PANEL
Chol/HDL Ratio: 6.8 ratio — ABNORMAL HIGH (ref 0.0–5.0)
Cholesterol, Total: 191 mg/dL (ref 100–199)
HDL: 28 mg/dL — ABNORMAL LOW (ref >39)
LDL Chol Calc (NIH): 103 mg/dL — ABNORMAL HIGH (ref 0–99)
Triglycerides: 355 mg/dL — ABNORMAL HIGH (ref 0–149)
VLDL Cholesterol Cal: 60 mg/dL — ABNORMAL HIGH (ref 5–40)

## 2020-05-12 LAB — HEPATIC FUNCTION PANEL
ALT: 38 IU/L (ref 0–44)
AST: 41 IU/L — ABNORMAL HIGH (ref 0–40)
Albumin: 4.1 g/dL (ref 3.8–4.8)
Alkaline Phosphatase: 111 IU/L (ref 44–121)
Bilirubin Total: 0.7 mg/dL (ref 0.0–1.2)
Bilirubin, Direct: 0.23 mg/dL (ref 0.00–0.40)
Total Protein: 7.5 g/dL (ref 6.0–8.5)

## 2020-05-12 LAB — HEMOGLOBIN A1C
Est. average glucose Bld gHb Est-mCnc: 203 mg/dL
Hgb A1c MFr Bld: 8.7 % — ABNORMAL HIGH (ref 4.8–5.6)

## 2020-05-12 MED ORDER — GLIPIZIDE ER 10 MG PO TB24: ORAL_TABLET | ORAL | 1 refills | Status: DC

## 2020-05-12 MED ORDER — METFORMIN HCL 500 MG PO TABS: ORAL_TABLET | ORAL | 3 refills | Status: DC

## 2020-05-13 ENCOUNTER — Other Ambulatory Visit: Payer: Self-pay

## 2020-05-13 ENCOUNTER — Encounter: Payer: Self-pay | Admitting: Gastroenterology

## 2020-05-13 ENCOUNTER — Ambulatory Visit (INDEPENDENT_AMBULATORY_CARE_PROVIDER_SITE_OTHER): Payer: PPO | Admitting: Gastroenterology

## 2020-05-13 VITALS — BP 106/74 | HR 56 | Temp 97.3°F | Ht 65.0 in | Wt 220.0 lb

## 2020-05-13 DIAGNOSIS — K7460 Unspecified cirrhosis of liver: Secondary | ICD-10-CM

## 2020-05-13 DIAGNOSIS — R188 Other ascites: Secondary | ICD-10-CM

## 2020-05-13 DIAGNOSIS — Z800 Family history of malignant neoplasm of digestive organs: Secondary | ICD-10-CM | POA: Diagnosis not present

## 2020-05-13 NOTE — Progress Notes (Signed)
Primary Care Physician: [name], MD  Primary Gastroenterologist:  Garfield Cornea, MD   Chief Complaint  Patient presents with  . Follow-up    HPI: Shawn [name]. is a 65 y.o. male here for follow-up.  Of note, patient has had multiple no shows/cancellations, last seen 05/2019. He has a history of cirrhosis felt to be due to [facility] +/- etoh.  History of diabetes for greater than 5 years, no alcohol since 2013.  Some intermittent heavy drinking in the past but not necessarily on a regular basis.  In early 2021, he had first episode of decompensation, presenting with ascites/lower extremity edema.  He completed his first paracentesis in March 2021 with 3.6 L of ascites removed.  No evidence of SBP, cytology negative as well.  He is immune to hepatitis A and B.  He has a history of elevated AFP requiring MRI last year which showed no evidence of HCC.  Needs EGD for esophageal variceal screening. Colon cancer screening: No prior colonoscopy.  Mother had colon cancer.  Today: Denies any significant GI complaints.  Denies abdominal pain or swelling.  Bowel movements are regular.  No blood in the stool or melena.  Heartburn well controlled.  No solid food dysphagia.  Occasionally gets strangled when he drinks water.  He has had a rash on the upper chest which is been itchy.  Saw PCP 2 days ago was started on a cream that has been helpful.  Recent labs show decline in creatinine.  PCP has adjusted some of his diabetes medications.  Plans to recheck labs next month.  Current Outpatient Medications  Medication Sig Dispense Refill  . amLODipine (NORVASC) 5 MG tablet Take 1 tablet (5 mg total) by mouth daily. 90 tablet 1  . ascorbic acid (VITAMIN C) 500 MG tablet Take 500 mg by mouth daily.    Marland Kitchen aspirin EC 81 MG tablet Take 81 mg by mouth daily.    . Cyanocobalamin (B-12 PO) Take by mouth daily.    . diazepam (VALIUM) 5 MG tablet Take 1 tablet twice per day prn 60 tablet 5  .  furosemide (LASIX) 20 MG tablet Take 2 tablets (40 mg total) by mouth daily. 180 tablet 1  . glipiZIDE (GLUCOTROL XL) 10 MG 24 hr tablet Take one tablet po daily 90 tablet 1  . lisinopril (ZESTRIL) 10 MG tablet Take 1 tablet (10 mg total) by mouth daily. 90 tablet 1  . metFORMIN (GLUCOPHAGE) 500 MG tablet Take one tablet po BID 60 tablet 3  . metoprolol succinate (TOPROL-XL) 50 MG 24 hr tablet TAKE 1 TABLET (50 MG TOTAL) BY MOUTH DAILY. WITH FOOD 90 tablet 1  . Omega-3 Fatty Acids (FISH OIL) 1200 MG CAPS Take by mouth daily.    Marland Kitchen OVER THE COUNTER MEDICATION Active mind 1053m daily    . pantoprazole (PROTONIX) 40 MG tablet TAKE 1 TABLET BY MOUTH EVERY DAY 90 tablet 1  . potassium chloride SA (KLOR-CON M20) 20 MEQ tablet Take one tablet po qd 90 tablet 1  . pravastatin (PRAVACHOL) 80 MG tablet Take 1 tablet (80 mg total) by mouth daily. 90 tablet 1  . spironolactone (ALDACTONE) 50 MG tablet Take 1 tablet (50 mg total) by mouth daily. 90 tablet 1  . triamcinolone (KENALOG) 0.1 % Apply bid prn to rash 45 g 4  . venlafaxine XR (EFFEXOR-XR) 150 MG 24 hr capsule Take 1 capsule (150 mg total) by mouth 2 (two) times daily. 1[place]  capsule 1  . zinc gluconate 50 MG tablet Take 50 mg by mouth daily.    . blood glucose meter kit and supplies KIT Dispense based on patient and insurance preference. Use daily as directed. (FOR ICD - 10 E11.9) 1 each 5  . glucose blood (ONETOUCH VERIO) test strip USE AS INSTRUCTED TO TEST BLOOD SUGAR DAILY 100 strip 0  . Lancets (ONETOUCH DELICA PLUS [id number]) MISC USE TO OBTAIN A BLOOD SPECIMENT OT TEST BLOOD SUGAR DAILY 100 each 0  . NON FORMULARY Cordyceps 545m daily     No current facility-administered medications for this visit.    Allergies as of 05/13/2020  . (No Known Allergies)    ROS:  General: Negative for anorexia, weight loss, fever, chills, fatigue, weakness. ENT: Negative for hoarseness, difficulty swallowing , nasal congestion. CV: Negative for chest  pain, angina, palpitations, dyspnea on exertion, peripheral edema.  Respiratory: Negative for dyspnea at rest, dyspnea on exertion, cough, sputum, wheezing.  GI: See history of present illness. GU:  Negative for dysuria, hematuria, urinary incontinence, urinary frequency, nocturnal urination.  Endo: Negative for unusual weight change.    Physical Examination:   BP 106/74   Pulse (!) 56   Temp (!) 97.3 F (36.3 C)   Ht 5' 5" (1.651 m)   Wt 220 lb (99.8 kg)   BMI 36.61 kg/m   General: Well-nourished, well-developed in no acute distress.  Eyes: No icterus. Mouth: masked Lungs: Clear to auscultation bilaterally.  Heart: Regular rate and rhythm, no murmurs rubs or gallops.  Abdomen: Bowel sounds are normal, protuberant but soft, nontender, no hepatosplenomegaly or masses, no abdominal bruits or hernia , no rebound or guarding.   Extremities: No lower extremity edema. No clubbing or deformities. Neuro: Alert and oriented x 4 no asterixis Skin: Warm and dry, no jaundice.   Psych: Alert and cooperative, normal mood and affect.  Labs:  Lab Results  Component Value Date   CREATININE 1.75 (H) 05/11/2020   BUN 26 05/11/2020   NA 137 05/11/2020   K 5.0 05/11/2020   CL 100 05/11/2020   CO2 20 05/11/2020   Lab Results  Component Value Date   WBC 5.5 05/14/2019   HGB 15.5 05/14/2019   HCT 47.0 05/14/2019   MCV 94 05/14/2019   PLT 151 05/14/2019   Lab Results  Component Value Date   ALT 38 05/11/2020   AST 41 (H) 05/11/2020   ALKPHOS 111 05/11/2020   BILITOT 0.7 05/11/2020   Lab Results  Component Value Date   INR 1.2 11/28/2019   INR 1.2 06/05/2019   INR 1.2 05/29/2017   Lab Results  Component Value Date   HGBA1C 8.7 (H) 05/11/2020     Labs from March 2021: Hepatitis A total antibody positive, hepatitis B surface antibody reactive, hepatitis B surface antigen negative, AFP 8.5, mitochondrial antibody less than 20.   Repeat AFT 7.4, 11/2019  Imaging Studies: Right  upper quadrant ultrasound December 2021:  -Cirrhotic liver, cholelithiasis, no hepatoma.  MRI liver with and without contrast June 2021: For cirrhosis, elevated LFTs, elevated AFP -Cirrhosis, mild splenomegaly, mild perigastric varices, mild perihepatic ascites, patent portal vein, no findings [facility] Assessment/plan:  65year old male with history of cirrhosis, suspected NKarlene Linemanbut does have a history of intermittent heavy alcohol use in the past.  He presented back in March with decompensation, lower extremity edema and ascites requiring paracentesis.  At that time he also had a mildly elevated AFP which was followed up  with MRI liver without evidence of hepatoma.  Cirrhosis: Currently on Lasix and Aldactone for history of anasarca.  Completed right upper quadrant ultrasound in December with no evidence of hepatoma.  He is immune to hepatitis A and B.  He is due for liver labs and AFP.  He is scheduled to go back next month for recheck on his creatinine with his PCP, he will take her labs at the same time.  Next right upper quadrant ultrasound in June 2022.  Recommend patient have an EGD for esophageal variceal screening.  At this time he would like to discuss with his wife because they have a lot of things going on at home right now.  He will call and let me know what he decides.  Colon cancer screening: He denies prior colonoscopy.  His mother had colon cancer.  Right now he is not sure if he wants to pursue.  He will let me know if he changes his mind.  Cholelithiasis: Asymptomatic.  Encouraged low-fat diet.   

## 2020-05-13 NOTE — Progress Notes (Signed)
Cc'ed to pcp °

## 2020-05-13 NOTE — Patient Instructions (Signed)
1. Please go for labs next month at [address] [facility] when you go for Dr. Lance Sell labs.  2. We will call and schedule you for a liver ultrasound in June 2022. 3. We recommend upper endoscopy to screen for blood vessels that can develop in the esophagus related to liver disease.  These can cause bleeding.  Let me know if you decide you would like to proceed. 4. Let me know if you would like to pursue a colonoscopy for colon cancer screening. 5. Return to the office in 6 months.

## 2020-05-18 ENCOUNTER — Telehealth: Payer: Self-pay

## 2020-05-19 ENCOUNTER — Telehealth: Payer: Self-pay | Admitting: Family Medicine

## 2020-05-19 NOTE — Telephone Encounter (Signed)
Patient is wanting to know if you have found out any information on diabetic meter that can be read through his cell phone to check his sugar. He was last seen 3/7. Please advise

## 2020-05-19 NOTE — Telephone Encounter (Signed)
Error

## 2020-05-19 NOTE — Telephone Encounter (Signed)
Patient advised that the continuous monitoring meters are only covered if patient is on insulin and requires testing 3-4 times a day. Patient verbalized understanding.  Patient states that he has a bump on his eyelid that is bothering him- he tried allergy med but it hasnt helped- like a stye on the out side and would like something called into [facility].

## 2020-05-24 ENCOUNTER — Encounter: Payer: Self-pay | Admitting: Family Medicine

## 2020-05-25 MED ORDER — CEPHALEXIN 500 MG PO CAPS: ORAL_CAPSULE | ORAL | 0 refills | Status: DC

## 2020-05-25 NOTE — Telephone Encounter (Signed)
Keflex sent to pharmacy. Left message to return call

## 2020-05-25 NOTE — Telephone Encounter (Signed)
Patient can try Keflex 500 mg 1 taken 3 times daily for 7 days if it does not help I recommend next step would be appointment with optometry ophthalmology thank you

## 2020-05-25 NOTE — Addendum Note (Signed)
Addended by: Vicente Males on: 05/25/2020 10:25 AM   Modules accepted: Orders

## 2020-05-27 ENCOUNTER — Other Ambulatory Visit: Payer: Self-pay | Admitting: Family Medicine

## 2020-05-27 NOTE — Telephone Encounter (Signed)
Left message to return call. [facility] to see if [facility] has picked up med and he has not

## 2020-05-27 NOTE — Telephone Encounter (Signed)
Pt aware.

## 2020-06-08 ENCOUNTER — Other Ambulatory Visit: Payer: Self-pay | Admitting: Family Medicine

## 2020-06-08 NOTE — Telephone Encounter (Signed)
Metformin can be refilled for 6 months Lisinopril appears to be 10 mg not 40 mg according to our last prescription this may be given for 6 months Diazepam may put 2 refills and pend

## 2020-06-09 ENCOUNTER — Other Ambulatory Visit: Payer: Self-pay

## 2020-06-09 MED ORDER — LISINOPRIL 10 MG PO TABS: 10.0000 mg | ORAL_TABLET | Freq: Every day | ORAL | 1 refills | Status: DC

## 2020-06-09 MED ORDER — METFORMIN HCL 500 MG PO TABS: ORAL_TABLET | ORAL | 1 refills | Status: DC

## 2020-06-10 MED ORDER — DIAZEPAM 5 MG PO TABS: ORAL_TABLET | ORAL | 2 refills | Status: DC

## 2020-08-01 ENCOUNTER — Other Ambulatory Visit: Payer: Self-pay | Admitting: Family Medicine

## 2020-08-25 ENCOUNTER — Other Ambulatory Visit: Payer: Self-pay | Admitting: Family Medicine

## 2020-08-30 ENCOUNTER — Other Ambulatory Visit: Payer: Self-pay | Admitting: Family Medicine

## 2020-08-31 MED ORDER — FUROSEMIDE 20 MG PO TABS: 40.0000 mg | ORAL_TABLET | Freq: Every day | ORAL | 1 refills | Status: DC

## 2020-09-03 ENCOUNTER — Other Ambulatory Visit: Payer: Self-pay | Admitting: Family Medicine

## 2020-09-04 NOTE — Telephone Encounter (Signed)
May have 90-day with 1 refill needs follow-up visit this fall

## 2020-09-04 NOTE — Telephone Encounter (Signed)
LAST MED CHCK UP3/7/22

## 2020-10-14 ENCOUNTER — Encounter: Payer: Self-pay | Admitting: Internal Medicine

## 2020-10-25 ENCOUNTER — Other Ambulatory Visit: Payer: Self-pay | Admitting: Family Medicine

## 2020-10-26 NOTE — Telephone Encounter (Signed)
Please verify patient's medicines with his wife.  If it is not on our med list I am hesitant to get it filled.  Also check with pharmacy.  Patient needs to schedule an office visit and bring all of his medicines to his office visit.  This can be in September.

## 2020-11-18 ENCOUNTER — Ambulatory Visit: Payer: PPO | Admitting: Gastroenterology

## 2020-11-24 ENCOUNTER — Other Ambulatory Visit: Payer: Self-pay | Admitting: Family Medicine

## 2020-11-24 ENCOUNTER — Other Ambulatory Visit: Payer: Self-pay

## 2020-11-26 ENCOUNTER — Telehealth: Payer: Self-pay | Admitting: Family Medicine

## 2020-11-26 NOTE — Telephone Encounter (Signed)
  Left message for patient to call back and schedule Medicare Annual Wellness Visit (AWV) in office.   If unable to come into the office for AWV,  please offer to do virtually or by telephone.  No hx of AWV eligible for AWVI as of 02/05/2019  Please schedule at anytime with [facility].      40 Minutes appointment   Any questions, please call me at [phone]

## 2020-12-02 ENCOUNTER — Telehealth: Payer: Self-pay | Admitting: Family Medicine

## 2020-12-02 DIAGNOSIS — E7849 Other hyperlipidemia: Secondary | ICD-10-CM

## 2020-12-02 DIAGNOSIS — I10 Essential (primary) hypertension: Secondary | ICD-10-CM

## 2020-12-02 DIAGNOSIS — N289 Disorder of kidney and ureter, unspecified: Secondary | ICD-10-CM

## 2020-12-02 DIAGNOSIS — Z125 Encounter for screening for malignant neoplasm of prostate: Secondary | ICD-10-CM

## 2020-12-02 DIAGNOSIS — Z79899 Other long term (current) drug therapy: Secondary | ICD-10-CM

## 2020-12-02 DIAGNOSIS — N1831 Chronic kidney disease, stage 3a: Secondary | ICD-10-CM

## 2020-12-02 DIAGNOSIS — Z0000 Encounter for general adult medical examination without abnormal findings: Secondary | ICD-10-CM

## 2020-12-02 NOTE — Telephone Encounter (Signed)
Patient has medication follow up on 10/26 and wanting to know if he needed labs done

## 2020-12-02 NOTE — Telephone Encounter (Signed)
Schedule appointment for 10/26

## 2020-12-03 NOTE — Telephone Encounter (Signed)
Lab orders placed and pt is aware. Pt informed to bring all meds to visit. Pt verbalized understanding.

## 2020-12-03 NOTE — Telephone Encounter (Signed)
PSA, lipid, liver, metabolic 7, urine ACR, B4Y, CBC  Hyperlipidemia renal insufficiency diabetes screening wellness  Very important patient brings all of his medicines at this visit please

## 2020-12-10 ENCOUNTER — Other Ambulatory Visit: Payer: Self-pay | Admitting: Family Medicine

## 2020-12-28 DIAGNOSIS — N1831 Chronic kidney disease, stage 3a: Secondary | ICD-10-CM | POA: Diagnosis not present

## 2020-12-28 DIAGNOSIS — Z125 Encounter for screening for malignant neoplasm of prostate: Secondary | ICD-10-CM | POA: Diagnosis not present

## 2020-12-28 DIAGNOSIS — N289 Disorder of kidney and ureter, unspecified: Secondary | ICD-10-CM | POA: Diagnosis not present

## 2020-12-28 DIAGNOSIS — Z800 Family history of malignant neoplasm of digestive organs: Secondary | ICD-10-CM | POA: Diagnosis not present

## 2020-12-28 DIAGNOSIS — I10 Essential (primary) hypertension: Secondary | ICD-10-CM | POA: Diagnosis not present

## 2020-12-28 DIAGNOSIS — E1122 Type 2 diabetes mellitus with diabetic chronic kidney disease: Secondary | ICD-10-CM | POA: Diagnosis not present

## 2020-12-28 DIAGNOSIS — K7460 Unspecified cirrhosis of liver: Secondary | ICD-10-CM | POA: Diagnosis not present

## 2020-12-28 DIAGNOSIS — R188 Other ascites: Secondary | ICD-10-CM | POA: Diagnosis not present

## 2020-12-28 DIAGNOSIS — Z0000 Encounter for general adult medical examination without abnormal findings: Secondary | ICD-10-CM | POA: Diagnosis not present

## 2020-12-28 DIAGNOSIS — E7849 Other hyperlipidemia: Secondary | ICD-10-CM | POA: Diagnosis not present

## 2020-12-28 DIAGNOSIS — Z79899 Other long term (current) drug therapy: Secondary | ICD-10-CM | POA: Diagnosis not present

## 2020-12-29 ENCOUNTER — Other Ambulatory Visit: Payer: Self-pay

## 2020-12-29 DIAGNOSIS — K760 Fatty (change of) liver, not elsewhere classified: Secondary | ICD-10-CM

## 2020-12-29 LAB — BASIC METABOLIC PANEL
BUN/Creatinine Ratio: 11 (ref 10–24)
BUN: 14 mg/dL (ref 8–27)
CO2: 20 mmol/L (ref 20–29)
Calcium: 9.5 mg/dL (ref 8.6–10.2)
Chloride: 106 mmol/L (ref 96–106)
Creatinine, Ser: 1.29 mg/dL — ABNORMAL HIGH (ref 0.76–1.27)
Glucose: 90 mg/dL (ref 70–99)
Potassium: 4.2 mmol/L (ref 3.5–5.2)
Sodium: 142 mmol/L (ref 134–144)
eGFR: 62 mL/min/{1.73_m2} (ref >59)

## 2020-12-29 LAB — PROTIME-INR
INR: 1.2 (ref 0.9–1.2)
Prothrombin Time: 12.9 s — ABNORMAL HIGH (ref 9.1–12.0)

## 2020-12-29 LAB — COMPREHENSIVE METABOLIC PANEL
ALT: 39 IU/L (ref 0–44)
AST: 45 IU/L — ABNORMAL HIGH (ref 0–40)
Albumin/Globulin Ratio: 1.2 (ref 1.2–2.2)
Albumin: 3.7 g/dL — ABNORMAL LOW (ref 3.8–4.8)
Alkaline Phosphatase: 103 IU/L (ref 44–121)
BUN/Creatinine Ratio: 11 (ref 10–24)
BUN: 14 mg/dL (ref 8–27)
Bilirubin Total: 0.8 mg/dL (ref 0.0–1.2)
CO2: 21 mmol/L (ref 20–29)
Calcium: 9.3 mg/dL (ref 8.6–10.2)
Chloride: 104 mmol/L (ref 96–106)
Creatinine, Ser: 1.25 mg/dL (ref 0.76–1.27)
Globulin, Total: 3.2 g/dL (ref 1.5–4.5)
Glucose: 88 mg/dL (ref 70–99)
Potassium: 4.2 mmol/L (ref 3.5–5.2)
Sodium: 140 mmol/L (ref 134–144)
Total Protein: 6.9 g/dL (ref 6.0–8.5)
eGFR: 65 mL/min/{1.73_m2} (ref >59)

## 2020-12-29 LAB — HEPATIC FUNCTION PANEL
ALT: 39 IU/L (ref 0–44)
AST: 43 IU/L — ABNORMAL HIGH (ref 0–40)
Albumin: 3.6 g/dL — ABNORMAL LOW (ref 3.8–4.8)
Alkaline Phosphatase: 102 IU/L (ref 44–121)
Bilirubin Total: 0.8 mg/dL (ref 0.0–1.2)
Bilirubin, Direct: 0.25 mg/dL (ref 0.00–0.40)
Total Protein: 7 g/dL (ref 6.0–8.5)

## 2020-12-29 LAB — LIPID PANEL
Chol/HDL Ratio: 5.2 ratio — ABNORMAL HIGH (ref 0.0–5.0)
Cholesterol, Total: 172 mg/dL (ref 100–199)
HDL: 33 mg/dL — ABNORMAL LOW (ref >39)
LDL Chol Calc (NIH): 104 mg/dL — ABNORMAL HIGH (ref 0–99)
Triglycerides: 199 mg/dL — ABNORMAL HIGH (ref 0–149)
VLDL Cholesterol Cal: 35 mg/dL (ref 5–40)

## 2020-12-29 LAB — CBC WITH DIFFERENTIAL/PLATELET
Basophils Absolute: 0 10*3/uL (ref 0.0–0.2)
Basophils Absolute: 0.1 10*3/uL (ref 0.0–0.2)
Basos: 1 %
Basos: 1 %
EOS (ABSOLUTE): 0.2 10*3/uL (ref 0.0–0.4)
EOS (ABSOLUTE): 0.2 10*3/uL (ref 0.0–0.4)
Eos: 5 %
Eos: 6 %
Hematocrit: 43.2 % (ref 37.5–51.0)
Hematocrit: 44.9 % (ref 37.5–51.0)
Hemoglobin: 14.8 g/dL (ref 13.0–17.7)
Hemoglobin: 14.9 g/dL (ref 13.0–17.7)
Immature Grans (Abs): 0 10*3/uL (ref 0.0–0.1)
Immature Grans (Abs): 0 10*3/uL (ref 0.0–0.1)
Immature Granulocytes: 0 %
Immature Granulocytes: 0 %
Lymphocytes Absolute: 0.9 10*3/uL (ref 0.7–3.1)
Lymphocytes Absolute: 0.9 10*3/uL (ref 0.7–3.1)
Lymphs: 21 %
Lymphs: 21 %
MCH: 31.7 pg (ref 26.6–33.0)
MCH: 31.1 pg (ref 26.6–33.0)
MCHC: 34.3 g/dL (ref 31.5–35.7)
MCHC: 33.2 g/dL (ref 31.5–35.7)
MCV: 93 fL (ref 79–97)
MCV: 94 fL (ref 79–97)
Monocytes Absolute: 0.5 10*3/uL (ref 0.1–0.9)
Monocytes Absolute: 0.5 10*3/uL (ref 0.1–0.9)
Monocytes: 12 %
Monocytes: 12 %
Neutrophils Absolute: 2.5 10*3/uL (ref 1.4–7.0)
Neutrophils Absolute: 2.5 10*3/uL (ref 1.4–7.0)
Neutrophils: 61 %
Neutrophils: 60 %
Platelets: 133 10*3/uL — ABNORMAL LOW (ref 150–450)
Platelets: 130 10*3/uL — ABNORMAL LOW (ref 150–450)
RBC: 4.67 x10E6/uL (ref 4.14–5.80)
RBC: 4.79 x10E6/uL (ref 4.14–5.80)
RDW: 13.8 % (ref 11.6–15.4)
RDW: 13.7 % (ref 11.6–15.4)
WBC: 4.1 10*3/uL (ref 3.4–10.8)
WBC: 4.2 10*3/uL (ref 3.4–10.8)

## 2020-12-29 LAB — MICROALBUMIN / CREATININE URINE RATIO

## 2020-12-29 LAB — HEMOGLOBIN A1C
Est. average glucose Bld gHb Est-mCnc: 134 mg/dL
Hgb A1c MFr Bld: 6.3 % — ABNORMAL HIGH (ref 4.8–5.6)

## 2020-12-29 LAB — AFP TUMOR MARKER: AFP, Serum, Tumor Marker: 9.8 ng/mL — ABNORMAL HIGH (ref 0.0–8.4)

## 2020-12-29 LAB — PSA: Prostate Specific Ag, Serum: 0.5 ng/mL (ref 0.0–4.0)

## 2020-12-30 ENCOUNTER — Encounter: Payer: Self-pay | Admitting: Family Medicine

## 2020-12-30 ENCOUNTER — Ambulatory Visit (INDEPENDENT_AMBULATORY_CARE_PROVIDER_SITE_OTHER): Payer: PPO | Admitting: Family Medicine

## 2020-12-30 ENCOUNTER — Other Ambulatory Visit: Payer: Self-pay

## 2020-12-30 VITALS — BP 118/68 | Temp 97.8°F | Wt 224.0 lb

## 2020-12-30 DIAGNOSIS — Z79899 Other long term (current) drug therapy: Secondary | ICD-10-CM

## 2020-12-30 DIAGNOSIS — K7460 Unspecified cirrhosis of liver: Secondary | ICD-10-CM

## 2020-12-30 DIAGNOSIS — E119 Type 2 diabetes mellitus without complications: Secondary | ICD-10-CM | POA: Diagnosis not present

## 2020-12-30 DIAGNOSIS — Z23 Encounter for immunization: Secondary | ICD-10-CM

## 2020-12-30 DIAGNOSIS — I10 Essential (primary) hypertension: Secondary | ICD-10-CM | POA: Diagnosis not present

## 2020-12-30 DIAGNOSIS — R188 Other ascites: Secondary | ICD-10-CM

## 2020-12-30 MED ORDER — DIAZEPAM 5 MG PO TABS: ORAL_TABLET | ORAL | 5 refills | Status: DC

## 2020-12-30 MED ORDER — METOPROLOL SUCCINATE ER 50 MG PO TB24: 50.0000 mg | ORAL_TABLET | Freq: Every day | ORAL | 1 refills | Status: DC

## 2020-12-30 MED ORDER — PANTOPRAZOLE SODIUM 40 MG PO TBEC: 40.0000 mg | DELAYED_RELEASE_TABLET | Freq: Every day | ORAL | 1 refills | Status: DC

## 2020-12-30 MED ORDER — POTASSIUM CHLORIDE CRYS ER 20 MEQ PO TBCR: EXTENDED_RELEASE_TABLET | ORAL | 1 refills | Status: DC

## 2020-12-30 MED ORDER — VENLAFAXINE HCL ER 150 MG PO CP24: 150.0000 mg | ORAL_CAPSULE | Freq: Two times a day (BID) | ORAL | 1 refills | Status: DC

## 2020-12-30 MED ORDER — AMLODIPINE BESYLATE 5 MG PO TABS: 5.0000 mg | ORAL_TABLET | Freq: Every day | ORAL | 1 refills | Status: DC

## 2020-12-30 MED ORDER — ROSUVASTATIN CALCIUM 20 MG PO TABS: 20.0000 mg | ORAL_TABLET | Freq: Every day | ORAL | 3 refills | Status: DC

## 2020-12-30 MED ORDER — METFORMIN HCL 1000 MG PO TABS
1000.0000 mg | ORAL_TABLET | Freq: Two times a day (BID) | ORAL | 1 refills | Status: DC
Start: 2020-12-30 — End: 2021-10-18

## 2020-12-30 MED ORDER — SPIRONOLACTONE 50 MG PO TABS: 50.0000 mg | ORAL_TABLET | Freq: Every day | ORAL | 1 refills | Status: DC

## 2020-12-30 NOTE — Progress Notes (Signed)
   Subjective:    Patient ID: Shawn [name]., male    DOB: 12/10/1955, 65 y.o.   MRN: [id number]  HPI Pt here for medication check and HTN. Pt states blood pressure has been doing well. No issues. Taking all meds as directed.   The patient was seen today as part of a comprehensive diabetic check up. Patient has diabetes  Compliance-relates compliance with his medicine Low sugars-states he has had some low sugar spells Dietary effort-tries to minimize starches Foot exam and ophthalmology exam requirements were reviewed   Patient for blood pressure check up.  The patient does have hypertension.    Medication compliance-takes his medicine  Blood pressure control recently-does not know blood pressure been doing well  Dietary compliance-tries avoid excessive salt  Patient here for follow-up regarding cholesterol.    Diet-avoids fried foods  Compliance with medicine-takes his medications  Side effects-denies side effects  Activity-states overall fairly active  Regular lab work regarding lipid and liver was checked and if needing additional labs was appropriately ordered   Review of Systems     Objective:   Physical Exam General-in no acute distress Eyes-no discharge Lungs-respiratory rate normal, CTA CV-no murmurs,RRR Extremities skin warm dry no edema Neuro grossly normal Behavior normal, alert        Assessment & Plan:  1. Essential hypertension, benign Blood pressure good control continue current measures.  Watch diet closely - Basic Metabolic Panel (BMET) - Lipid Profile - Hemoglobin A1c  2. Cirrhosis of liver with ascites, unspecified hepatic cirrhosis type ([place]) Follow through with getting MRI as planned by GI healthy diet regular activity recommended - Basic Metabolic Panel (BMET) - Lipid Profile - Hemoglobin A1c  3. Type 2 diabetes mellitus without complication, without long-term current use of insulin (HCC) A1c too low stop glipizide monitor  sugars send [place] some readings follow-up labs in 3 to 4 months - Basic Metabolic Panel (BMET) - Lipid Profile - Hemoglobin A1c  4. High risk medication use Labs ordered - Basic Metabolic Panel (BMET) - Lipid Profile - Hemoglobin A1c  5. Need for vaccination Vaccine today - Flu Vaccine QUAD 6+ mos PF IM (Fluarix Quad PF)  Reflux good control continue current measures Electrolytes overall good control continue current measures Change statin to a different medication to get LDL below 70 Patient has been on Valium for years for anxiety currently doing well with his Effexor may continue the Valium.  Denies drowsiness with the medicine

## 2020-12-31 NOTE — Progress Notes (Signed)
12/31/20- pt states he sees his GI next month and will discuss at that time

## 2021-01-01 ENCOUNTER — Other Ambulatory Visit: Payer: Self-pay | Admitting: Family Medicine

## 2021-01-06 ENCOUNTER — Telehealth: Payer: Self-pay | Admitting: *Deleted

## 2021-01-06 NOTE — Chronic Care Management (AMB) (Signed)
  Chronic Care Management   Outreach Note  01/06/2021 Name: Shawn [name]. MRN: [id number] DOB: [birth year]  [name] [name]. is a 65 y.o. year old male who is a primary care patient of Luking, Elayne Snare, MD. I reached out to [name] by phone today in response to a referral sent by Mr. ANTRON SETH Jr.'s primary care provider.  An unsuccessful telephone outreach was attempted today. The patient was referred to the case management team for assistance with care management and care coordination.   Follow Up Plan: A HIPAA compliant phone message was left for the patient providing contact information and requesting a return call.  The care management team will reach out to the patient again over the next 7 days.  If patient returns call to provider office, please advise to call [place]* at [phone].*  [place] Management  Direct Dial: [phone]

## 2021-01-12 NOTE — Chronic Care Management (AMB) (Signed)
  Chronic Care Management   Outreach Note  01/12/2021 Name: Shawn [name]. MRN: [id number] DOB: [birth year]  Shawn [name]. is a 65 y.o. year old male who is a primary care patient of Luking, Elayne Snare, MD. I reached out to Shawn [name]. by phone today in response to a referral sent by Mr. JADIN CREQUE Jr.'s primary care provider.  A second unsuccessful telephone outreach was attempted today. The patient was referred to the case management team for assistance with care management and care coordination.   Follow Up Plan: A HIPAA compliant phone message was left for the patient providing contact information and requesting a return call.  The care management team will reach out to the patient again over the next 7 days.  If patient returns call to provider office, please advise to call [place]* at [phone].*  [place] Management  Direct Dial: [phone]

## 2021-01-12 NOTE — Chronic Care Management (AMB) (Signed)
  Chronic Care Management   Note  01/12/2021 Name: Shawn [name]. MRN: [id number] DOB: 12-Feb-1956  Shawn [name]. is a 65 y.o. year old male who is a primary care patient of Luking, Elayne Snare, MD. I reached out to [name] by phone today in response to a referral sent by [name]'s PCP.  Shawn [name] was given information about Chronic Care Management services today including:  CCM service includes personalized support from designated clinical staff supervised by his physician, including individualized plan of care and coordination with other care providers 24/7 contact phone numbers for assistance for urgent and routine care needs. Service will only be billed when office clinical staff spend 20 minutes or more in a month to coordinate care. Only one practitioner may furnish and bill the service in a calendar month. The patient may stop CCM services at any time (effective at the end of the month) by phone call to the office staff. The patient is responsible for co-pay (up to 20% after annual deductible is met) if co-pay is required by the individual health plan.   Patient agreed to services and verbal consent obtained.   Follow up plan: Telephone appointment with care management team member scheduled for:01/22/21  [place] Management  Direct Dial: [phone]

## 2021-01-13 ENCOUNTER — Ambulatory Visit (HOSPITAL_COMMUNITY)
Admission: RE | Admit: 2021-01-13 | Discharge: 2021-01-13 | Disposition: A | Discharge status: Home / Self Care | Payer: PPO | Source: Ambulatory Visit | Attending: Gastroenterology | Admitting: Gastroenterology

## 2021-01-13 ENCOUNTER — Other Ambulatory Visit: Payer: Self-pay

## 2021-01-13 DIAGNOSIS — K760 Fatty (change of) liver, not elsewhere classified: Secondary | ICD-10-CM | POA: Diagnosis not present

## 2021-01-13 DIAGNOSIS — N289 Disorder of kidney and ureter, unspecified: Secondary | ICD-10-CM | POA: Diagnosis not present

## 2021-01-13 DIAGNOSIS — K7469 Other cirrhosis of liver: Secondary | ICD-10-CM | POA: Diagnosis not present

## 2021-01-13 DIAGNOSIS — I864 Gastric varices: Secondary | ICD-10-CM | POA: Diagnosis not present

## 2021-01-13 DIAGNOSIS — K766 Portal hypertension: Secondary | ICD-10-CM | POA: Diagnosis not present

## 2021-01-13 IMAGING — MR MR ABDOMEN WO/W CM
20 series · 48 of 48 positions shown · IV contrast (gadavist)
Comparison: [DATE]

CLINICAL DATA: Cirrhosis with elevated AFP. Rule out hepatocellular
carcinoma.

EXAM:
MRI ABDOMEN WITHOUT AND WITH CONTRAST
TECHNIQUE: Multiplanar multisequence MR imaging of the abdomen was performed
both before and after the administration of intravenous contrast.
CONTRAST:  8mL GADAVIST GADOBUTROL 1 MMOL/ML IV SOLN

[Series 4: cor haste · coronal · 6.0mm · 1.25mm/px · 2 of 33 slices shown]
[im 1/33]
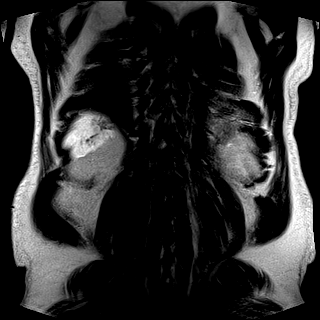
[im 33/33]
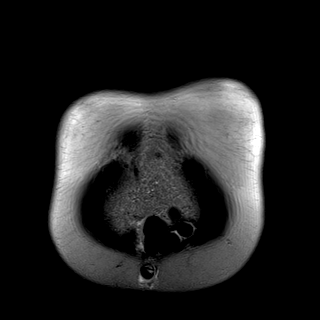

[Series 7: T2 fat-sat · axial · 6.0mm · 1.41mm/px · z∈[-67,+142]mm · 2 of 30 slices shown]
[im 1/30]
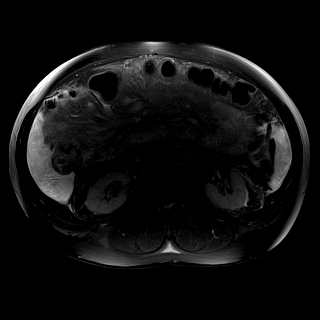
[im 30/30]
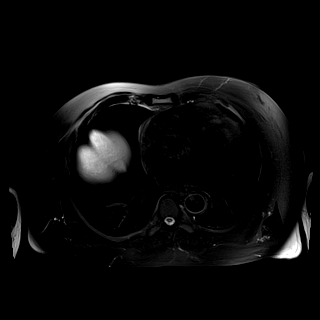

[Series 8: DWI · axial · 6.0mm · 1.68mm/px · z∈[-64,+159]mm · 2 of 32 slices shown (1 of 4)]
[im 1/32]
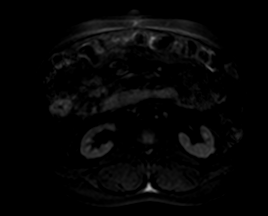
[im 32/32]
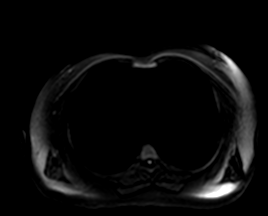

[Series 8: DWI · axial · 6.0mm · 1.68mm/px · 1 of 32 slices shown (2 of 4)]
[im 1/32]
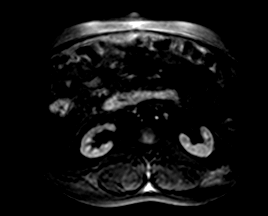

[Series 8: DWI · axial · 6.0mm · 1.68mm/px · 1 of 32 slices shown (3 of 4)]
[im 1/32]
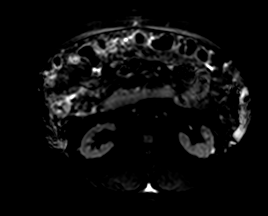

[Series 9: DWI · axial · 6.0mm · 1.68mm/px · 1 of 32 slices shown (4 of 4)]
[im 1/32]
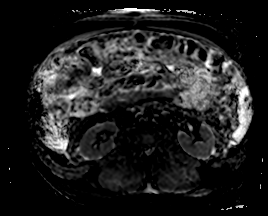

[Series 10: bSSFP · axial · 6.0mm · 0.88mm/px · z∈[-63,+183]mm · 2 of 42 slices shown]
[im 1/42]
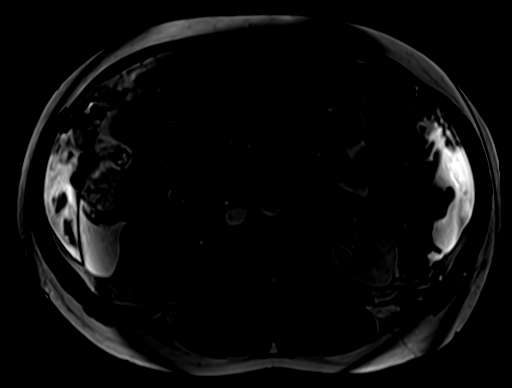
[im 42/42]
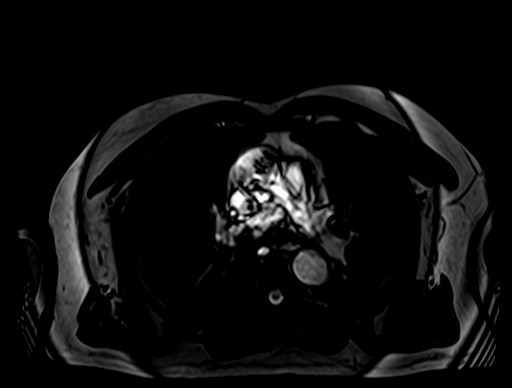

[Series 11: ax in and · axial · 3.0mm · 1.41mm/px · z∈[-71,+166]mm · 3 of 80 slices shown (1 of 2)]
[im 1/80]
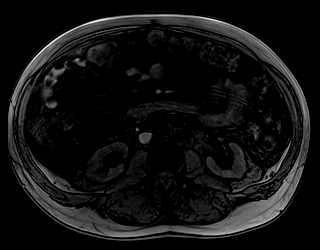
[im 40/80]
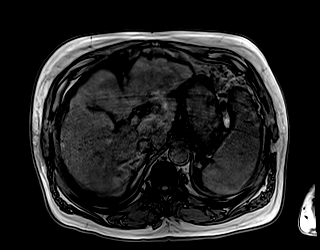
[im 80/80]
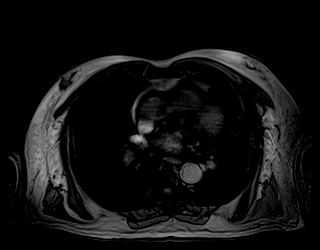

[Series 12: ax in and · axial · 3.0mm · 1.41mm/px · z∈[-71,+166]mm · 3 of 80 slices shown (2 of 2)]
[im 1/80]
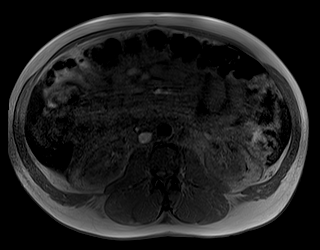
[im 40/80]
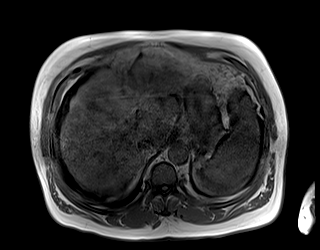
[im 80/80]
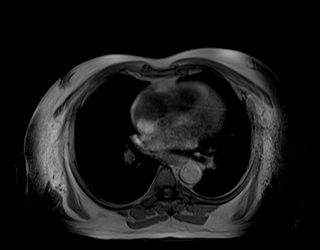

[Series 13: ax haste bh · axial · 6.0mm · 1.41mm/px · 1 of 30 slices shown]
[im 1/30]
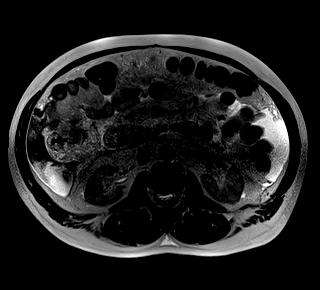

[Series 14: T1 dynamic · axial · 3.0mm · 1.41mm/px · z∈[-59,+154]mm · 3 of 72 slices shown (1 of 9)]
[im 1/72]
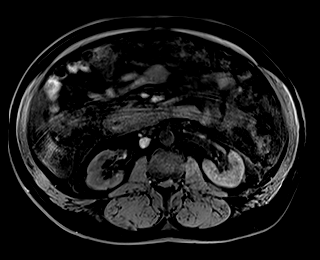
[im 36/72]
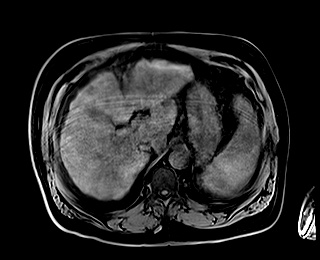
[im 72/72]
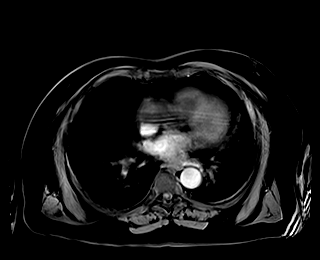

[Series 16: T1 dynamic · axial · 3.0mm · 1.41mm/px · z∈[-59,+154]mm · 3 of 72 slices shown (2 of 9)]
[im 1/72]
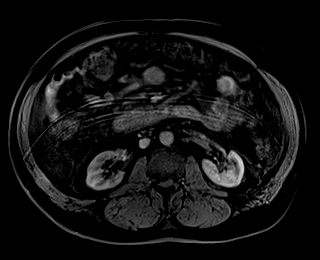
[im 36/72]
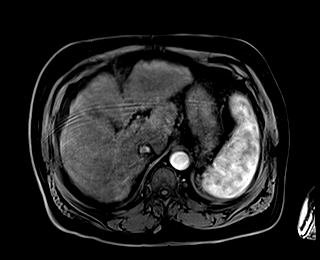
[im 72/72]
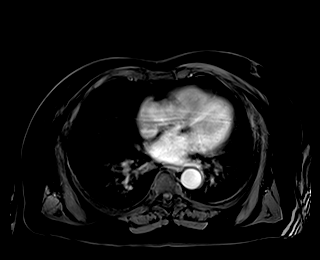

[Series 17: T1 dynamic · axial · 3.0mm · 1.41mm/px · z∈[-59,+154]mm · 3 of 72 slices shown (3 of 9)]
[im 1/72]
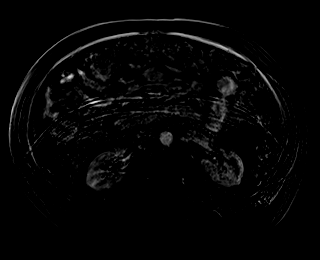
[im 36/72]
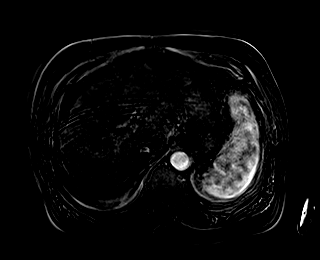
[im 72/72]
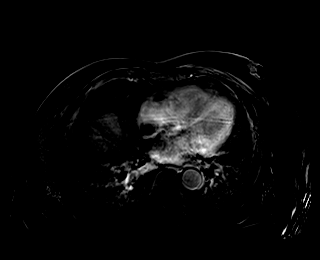

[Series 18: T1 dynamic · axial · 3.0mm · 1.41mm/px · z∈[-59,+154]mm · 3 of 72 slices shown (4 of 9)]
[im 1/72]
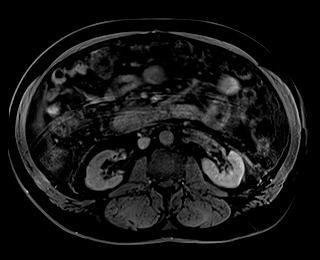
[im 36/72]
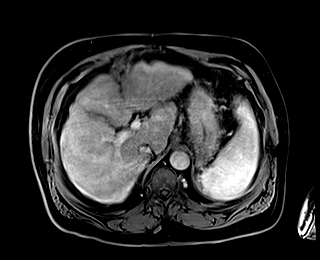
[im 72/72]
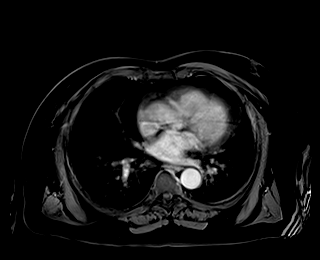

[Series 19: T1 dynamic · axial · 3.0mm · 1.41mm/px · z∈[-59,+154]mm · 3 of 72 slices shown (5 of 9)]
[im 1/72]
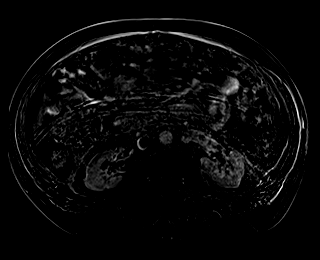
[im 36/72]
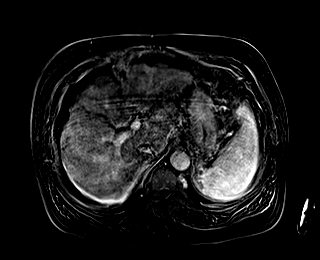
[im 72/72]
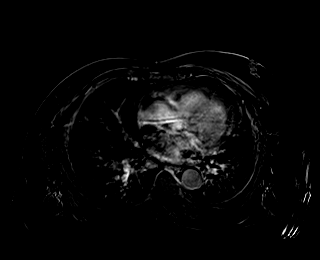

[Series 20: T1 dynamic · axial · 3.0mm · 1.41mm/px · z∈[-59,+154]mm · 3 of 72 slices shown (6 of 9)]
[im 1/72]
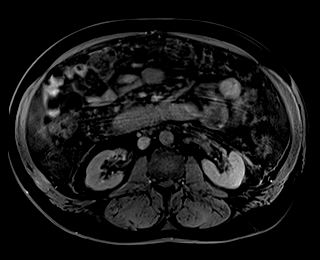
[im 36/72]
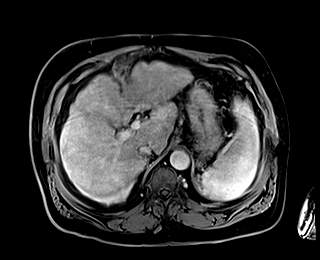
[im 72/72]
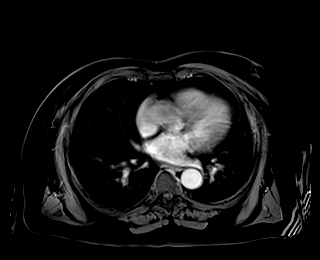

[Series 21: T1 dynamic · axial · 3.0mm · 1.41mm/px · z∈[-59,+154]mm · 3 of 72 slices shown (7 of 9)]
[im 1/72]
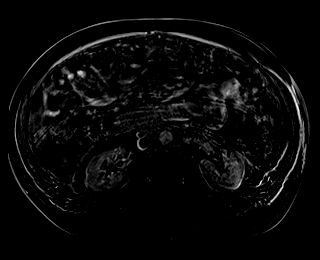
[im 36/72]
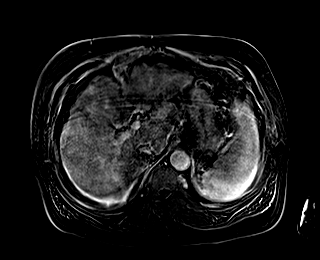
[im 72/72]
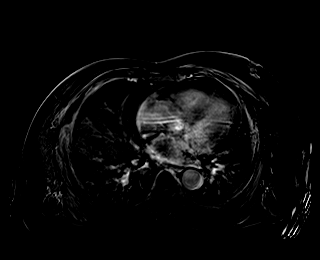

[Series 22: T1 dynamic · axial · 3.0mm · 1.41mm/px · z∈[-59,+154]mm · 3 of 72 slices shown (8 of 9)]
[im 1/72]
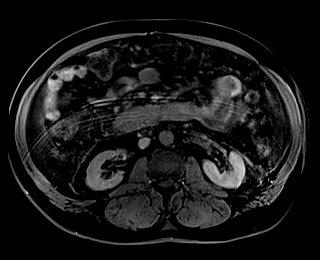
[im 36/72]
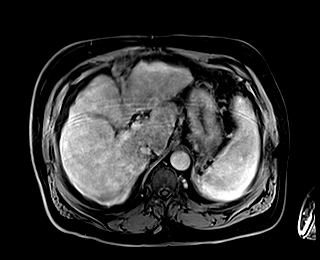
[im 72/72]
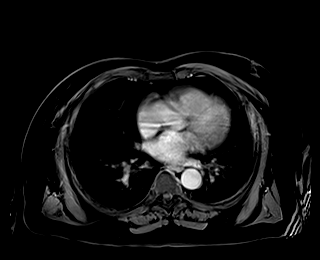

[Series 23: T1 dynamic · axial · 3.0mm · 1.41mm/px · z∈[-59,+154]mm · 3 of 72 slices shown (9 of 9)]
[im 1/72]
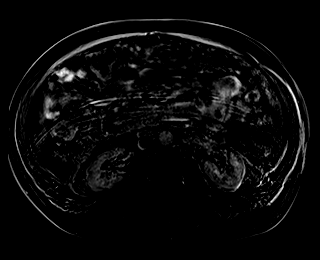
[im 36/72]
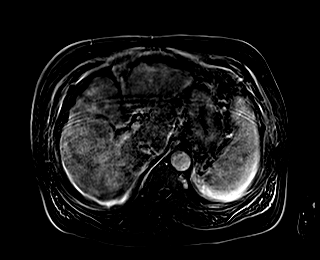
[im 72/72]
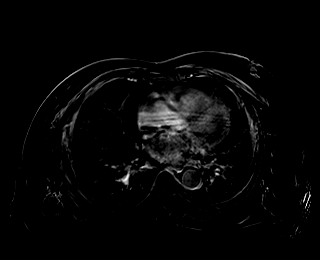

[Series 24: T1 dynamic post-contrast · coronal · 3.0mm · 1.56mm/px · 3 of 80 slices shown]
[im 1/80]
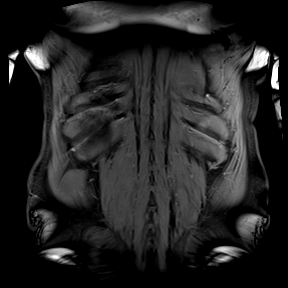
[im 40/80]
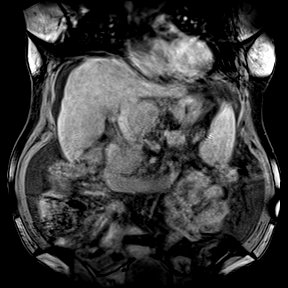
[im 80/80]
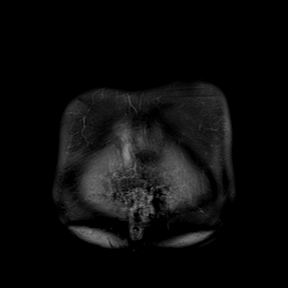

[48 of 48 positions shown; findings below may reference images not displayed]

FINDINGS: Mild to moderate motion degraded images. Most significant motion
involves the pre and postcontrast dynamics.

Lower chest: Mild cardiomegaly, without pericardial or pleural
effusion.

Hepatobiliary: Marked cirrhosis. No hypervascular liver lesions or
areas of delayed hypoenhancement.

Normal gallbladder, without biliary ductal dilatation.

Pancreas:  Normal, without mass or ductal dilatation.

Spleen:  Mild splenomegaly at 13.3 cm craniocaudal.

Adrenals/Urinary Tract: Normal adrenal glands. Bilateral tiny T2
hyperintense lesions are likely cysts. A 1.3 cm lower pole left
renal lesion is only imaged on over coronal views, but likely a
cyst. No hydronephrosis.

Stomach/Bowel: Normal stomach and abdominal bowel loops.

Vascular/Lymphatic: Aortic atherosclerosis. Patent portal and
splenic veins. Recannulized paraumbilical vein on 43/18. Small
gastric varices. No retroperitoneal or retrocrural adenopathy.

Other:  Small volume perihepatic and perisplenic ascites, similar.

Musculoskeletal: No acute osseous abnormality.
IMPRESSION: 1. Motion degraded images.
2. Cirrhosis, splenomegaly, portal venous hypertension, and similar
small volume ascites.
3. No evidence of hepatocellular carcinoma.

## 2021-01-13 MED ORDER — GADOBUTROL 1 MMOL/ML IV SOLN
10.0000 mL | Freq: Once | INTRAVENOUS | Status: AC | PRN
  Administered 2021-01-13: 8 mL via INTRAVENOUS

## 2021-01-19 ENCOUNTER — Other Ambulatory Visit: Payer: Self-pay

## 2021-01-19 DIAGNOSIS — K7460 Unspecified cirrhosis of liver: Secondary | ICD-10-CM

## 2021-01-22 ENCOUNTER — Ambulatory Visit (INDEPENDENT_AMBULATORY_CARE_PROVIDER_SITE_OTHER): Payer: PPO | Admitting: Pharmacist

## 2021-01-22 DIAGNOSIS — E119 Type 2 diabetes mellitus without complications: Secondary | ICD-10-CM

## 2021-01-22 DIAGNOSIS — E7849 Other hyperlipidemia: Secondary | ICD-10-CM

## 2021-01-22 DIAGNOSIS — I10 Essential (primary) hypertension: Secondary | ICD-10-CM

## 2021-01-22 NOTE — Patient Instructions (Signed)
Shawn Royston Cowper.,  It was great to talk to you today!  Please call me with any questions or concerns.   Visit Information   PATIENT GOALS/PLAN OF CARE:  Care Plan : Medication Management  Updates made by Beryle Lathe, [place] since 01/22/2021 12:00 AM     Problem: HTN, T2DM, HLD   Priority: High  Onset Date: 01/22/2021     Long-Range Goal: Disease Progression Prevention   Start Date: 01/22/2021  Expected End Date: 04/22/2021  This Visit's Progress: On track  Priority: High  Note:   Current Barriers:  Unable to independently monitor therapeutic efficacy Unable to achieve control of hyperlipidemia  Pharmacist Clinical Goal(s):  Patient will Achieve adherence to monitoring guidelines and medication adherence to achieve therapeutic efficacy Achieve control of hyperlipidemia as evidenced by improved LDL and improved triglycerides through collaboration with PharmD and provider.   Interventions: 1:1 collaboration with [name], MD regarding development and update of comprehensive plan of care as evidenced by provider attestation and co-signature Inter-disciplinary care team collaboration (see longitudinal plan of care) Comprehensive medication review performed; medication list updated in electronic medical record  Type 2 Diabetes - New goal.: Controlled; Most recent A1c at goal of <7% per ADA guidelines Current medications: metformin 1,000 mg by mouth twice daily and canagliflozin (Invokana) 300 mg by mouth once daily Glipizide recently discontinued due to hypoglycemia Intolerances: none Taking medications as directed: yes Side effects thought to be attributed to current medication regimen: no Hypoglycemia prevention: not indicated at this time Current meal patterns: not discussed today Current exercise: not discussed today On a statin: yes On aspirin 81 mg daily: yes Last microalbumin/creatinine ratio: 180 (12/12/18); on an ACEi/ARB: yes Last eye exam:  overdue Last foot exam: completed within last year Pneumonia vaccine: up to date Influenza vaccine: up to date Current glucose readings:  not discussed today Continue current medications as above per PCP  Hypertension - New goal.: Blood pressure under good control. Blood pressure is at goal of <130/80 mmHg per 2017 AHA/ACC guidelines. Has OSA and uses CPAP Current medications: lisinopril 10 mg by mouth once daily, amlodipine 5 mg by mouth once daily, spironolactone 50 mg by mouth once daily, metoprolol succinate 50 mg by mouth once daily, and torsemide 40 mg once daily Intolerances: none Taking medications as directed: yes Side effects thought to be attributed to current medication regimen: no Denies dizziness and lightheadedness. Current home blood pressure: checks periodically usually 130s/70s Encourage dietary sodium restriction/DASH diet Recommend home blood pressure monitoring to discuss at next visit Discussed need for medication compliance Continue current medications as above  Hyperlipidemia - New goal.: Uncontrolled. LDL above goal of <70 due to very high risk given diabetes + at least 1 additional major risk factor (hypertension and cigarette smoking) per 2020 AACE/ACE guidelines. Triglycerides above goal of <150 per 2020 AACE/ACE guidelines. Current medications: rosuvastatin 20 mg by mouth once daily and fish oil 1-3 capsules by mouth three time daily Patient recently switched from pravastatin to rosuvastatin by PCP due to elevated LDL Intolerances: none Taking medications as directed: yes Side effects thought to be attributed to current medication regimen: no Encourage dietary reduction of high fat containing foods such as butter, nuts, bacon, egg yolks, etc. Continue current medications as above Repeat lipid panel at next PCP visit. If LDL remains elevated above goal, consider adding ezetimibe 10 mg by mouth daily Patient needs to stop smoking. Will discuss more at future  visits  Patient Goals/Self-Care Activities Patient  will:  Focus on medication adherence by keeping up with prescription refills and either using a pill box or reminders to take your medications at the prescribed times Check blood pressure at least once daily, document, and provide at future appointments  Follow Up Plan: Telephone follow up appointment with care management team member scheduled for: 06/25/21      Consent to CCM Services: Mr. Nouri was given information about Chronic Care Management services including:  CCM service includes personalized support from designated clinical staff supervised by his physician, including individualized plan of care and coordination with other care providers 24/7 contact phone numbers for assistance for urgent and routine care needs. Service will only be billed when office clinical staff spend 20 minutes or more in a month to coordinate care. Only one practitioner may furnish and bill the service in a calendar month. The patient may stop CCM services at any time (effective at the end of the month) by phone call to the office staff. The patient will be responsible for cost sharing (co-pay) of up to 20% of the service fee (after annual deductible is met).  Patient agreed to services and verbal consent obtained.   Patient verbalizes understanding of instructions provided today and agrees to view in [place].   Telephone follow up appointment with care management team member scheduled for:06/25/21  Kennon Holter, PharmD Clinical Pharmacist [place] [phone]

## 2021-01-22 NOTE — Chronic Care Management (AMB) (Signed)
Chronic Care Management Pharmacy Note  01/22/2021 Name:  Shawn [name]. MRN:  [id number] DOB:  [birth year]  Summary:  Hyperlipidemia Patient recently switched from pravastatin to rosuvastatin by PCP due to elevated LDL Repeat lipid panel at next PCP visit. If LDL remains elevated above goal, consider adding ezetimibe 10 mg by mouth daily Patient needs to stop smoking. Will discuss more at future visits  Subjective: Shawn [name]. is an 65 y.o. year old male who is a primary patient of Luking, Elayne Snare, MD.  The CCM team was consulted for assistance with disease management and care coordination needs.    Engaged with patient by telephone for initial visit in response to provider referral for pharmacy case management and/or care coordination services.   Consent to Services:  The patient was given the following information about Chronic Care Management services today, agreed to services, and gave verbal consent: 1. CCM service includes personalized support from designated clinical staff supervised by the primary care provider, including individualized plan of care and coordination with other care providers 2. 24/7 contact phone numbers for assistance for urgent and routine care needs. 3. Service will only be billed when office clinical staff spend 20 minutes or more in a month to coordinate care. 4. Only one practitioner may furnish and bill the service in a calendar month. 5.The patient may stop CCM services at any time (effective at the end of the month) by phone call to the office staff. 6. The patient will be responsible for cost sharing (co-pay) of up to 20% of the service fee (after annual deductible is met). Patient agreed to services and consent obtained.  Patient Care Team: [name], MD as PCP - General (Family Medicine) [name], MD as Consulting Physician (Gastroenterology) Beryle Lathe, [facility] (Pharmacist)  Objective:  Lab Results  Component Value  Date   CREATININE 1.25 12/28/2020   CREATININE 1.29 (H) 12/28/2020   CREATININE 1.75 (H) 05/11/2020    Lab Results  Component Value Date   HGBA1C 6.3 (H) 12/28/2020   Last diabetic Eye exam:  Lab Results  Component Value Date/Time   HMDIABEYEEXA No Retinopathy 02/11/2016 12:00 AM    Last diabetic Foot exam: No results found for: HMDIABFOOTEX      Component Value Date/Time   CHOL 172 12/28/2020 1213   TRIG 199 (H) 12/28/2020 1213   HDL 33 (L) 12/28/2020 1213   CHOLHDL 5.2 (H) 12/28/2020 1213   CHOLHDL 4.7 12/10/2013 0923   VLDL 34 12/10/2013 0923   LDLCALC 104 (H) 12/28/2020 1213    Hepatic Function Latest Ref Rng & Units 12/28/2020 12/28/2020 05/11/2020  Total Protein 6.0 - 8.5 g/dL 6.9 7.0 7.5  Albumin 3.8 - 4.8 g/dL 3.7(L) 3.6(L) 4.1  AST 0 - 40 IU/L 45(H) 43(H) 41(H)  ALT 0 - 44 IU/L 39 39 38  Alk Phosphatase 44 - 121 IU/L 103 102 111  Total Bilirubin 0.0 - 1.2 mg/dL 0.8 0.8 0.7  Bilirubin, Direct 0.00 - 0.40 mg/dL - 0.25 0.23    Lab Results  Component Value Date/Time   TSH 3.010 05/14/2019 08:17 AM   TSH 1.220 11/12/2014 09:17 AM   FREET4 0.92 11/12/2014 09:17 AM   FREET4 1.06 09/20/2012 08:40 AM    CBC Latest Ref Rng & Units 12/28/2020 12/28/2020 05/14/2019  WBC 3.4 - 10.8 x10E3/uL 4.2 4.1 5.5  Hemoglobin 13.0 - 17.7 g/dL 14.9 14.8 15.5  Hematocrit 37.5 - 51.0 % 44.9 43.2 47.0  Platelets  150 - 450 x10E3/uL 130(L) 133(L) 151    No results found for: VD25OH  Clinical ASCVD: No  The ASCVD Risk score (Arnett DK, et al., 2019) failed to calculate for the following reasons:   Unable to determine if patient is Non-Hispanic African American    Social History   Tobacco Use  Smoking Status Never  Smokeless Tobacco Current   Types: Snuff  Tobacco Comments   Uses dip pouches occasionally   BP Readings from Last 3 Encounters:  12/30/20 118/68  05/13/20 106/74  05/11/20 96/70   Pulse Readings from Last 3 Encounters:  05/13/20 (!) 56  12/02/19 65   05/30/19 60   Wt Readings from Last 3 Encounters:  12/30/20 224 lb (101.6 kg)  05/13/20 220 lb (99.8 kg)  05/11/20 219 lb 6.4 oz (99.5 kg)    Assessment: Review of patient past medical history, allergies, medications, health status, including review of consultants reports, laboratory and other test data, was performed as part of comprehensive evaluation and provision of chronic care management services.   SDOH:  (Social Determinants of Health) assessments and interventions performed:    CCM Care Plan  No Known Allergies  Medications Reviewed Today     Reviewed by [name], MD (Physician) on 12/30/20 at 0903  Med List Status: <None>   Medication Order Taking? Sig Documenting Provider Last Dose Status Informant  amLODipine (NORVASC) 5 MG tablet [id number] Yes Take 1 tablet (5 mg total) by mouth daily. [name], MD Taking Active   ascorbic acid (VITAMIN C) 500 MG tablet [id number] Yes Take 500 mg by mouth daily. [provider] Taking Active   aspirin EC 81 MG tablet [id number] Yes Take 81 mg by mouth daily. [provider] Taking Active   blood glucose meter kit and supplies KIT [id number] Yes Dispense based on patient and insurance preference. Use daily as directed. (FOR ICD - 10 E11.9) [name], MD Taking Active Self  Cyanocobalamin (B-12 PO) [id number] Yes Take by mouth daily. [provider] Taking Active   diazepam (VALIUM) 5 MG tablet [id number] Yes TAKE 1 TABLET TWICE PER DAY AS NEEDED Luking, Scott A, MD Taking Active   furosemide (LASIX) 20 MG tablet [id number] Yes Take 2 tablets (40 mg total) by mouth daily. [name], MD Taking Active   glipiZIDE (GLUCOTROL XL) 10 MG 24 hr tablet [id number] Yes TAKE 1 TABLET BY MOUTH EVERY DAY Luking, Elayne Snare, MD Taking Active   glucose blood (ONETOUCH VERIO) test strip [id number] Yes USE AS INSTRUCTED TO TEST BLOOD SUGAR DAILY [name], MD Taking Active   Lancets (ONETOUCH DELICA PLUS  [id number]) [place] [id number] Yes USE TO OBTAIN A BLOOD SPECIMENT OT TEST BLOOD SUGAR DAILY [name], MD Taking Active   lisinopril (ZESTRIL) 10 MG tablet [id number] Yes TAKE 1 TABLET BY MOUTH EVERY DAY [name], MD Taking Active   metFORMIN (GLUCOPHAGE) 1000 MG tablet [id number] Yes TAKE 1 TABLET BY MOUTH TWICE A DAY Luking, Scott A, MD Taking Active   metFORMIN (GLUCOPHAGE) 500 MG tablet [id number] Yes Take one tablet po BID [name], MD Taking Active   metoprolol succinate (TOPROL-XL) 50 MG 24 hr tablet [id number] Yes TAKE 1 TABLET BY MOUTH DAILY. WITH FOOD. [name], MD Taking Active   Baruch Gouty [id number] Yes Cordyceps 576m daily [provider] Taking Active   Omega-3 Fatty Acids (FISH OIL) 1200 MG CAPS [id number] Take by mouth daily. [provider] Taking  Active   OVER THE COUNTER MEDICATION [id number] Yes Active mind 1068m daily [provider] Taking Active   pantoprazole (PROTONIX) 40 MG tablet [id number] TAKE 1 TABLET BY MOUTH EVERY DAY Luking, Scott A, MD Taking Active   potassium chloride SA (KLOR-CON M20) 20 MEQ tablet [id number] TAKE 1 TABLET BY MOUTH EVERY DAY Luking, Scott A, MD Taking Active   pravastatin (PRAVACHOL) 80 MG tablet [id number] Take 1 tablet (80 mg total) by mouth daily. LKathyrn Drown MD Taking Active   spironolactone (ALDACTONE) 50 MG tablet [id number] TAKE 1 TABLET BY MOUTH EVERY DAY Luking, Scott A, MD Taking Active   triamcinolone cream (KENALOG) 0.1 % [id number] APPLY TWICE A DAY AS NEEDED TO RASH Luking, SElayne Snare MD Taking Active   venlafaxine XR (EFFEXOR-XR) 150 MG 24 hr capsule [id number] TAKE 1 CAPSULE BY MOUTH TWICE A DAY Luking, Scott A, MD Taking Active   zinc gluconate 50 MG tablet [id number] Take 50 mg by mouth daily. [provider] Taking Active             Patient Active Problem List   Diagnosis Date Noted   Cirrhosis of liver ([place]    FH: colon cancer     GAD (generalized anxiety disorder) 12/02/2019   Ascites 05/29/2019   GERD (gastroesophageal reflux disease) 06/15/2017   Neck pain 12/30/2015   Morbid obesity ([place] 08/13/2015   Cognitive dysfunction 03/19/2015   Cirrhosis, non-alcoholic ([facility] 182/80/0349  Elevated LFTs 12/11/2014   Abnormal abdominal ultrasound 12/11/2014   ADD (attention deficit disorder) 05/02/2014   Prediabetes 12/19/2013   Fatty liver 12/19/2013   Type II diabetes mellitus, uncontrolled 12/19/2013   Fatigue 11/19/2013   History of cardiac catheterization 11/19/2013   Hyperlipidemia 09/12/2012   Obstructive sleep apnea 09/12/2012   Essential hypertension, benign 01/13/2012   Type 2 diabetes mellitus ([place] 01/13/2012    Immunization History  Administered Date(s) Administered   Influenza Split 01/05/2014   Influenza,inj,Quad PF,6+ Mos 12/03/2015, 01/17/2018, 12/30/2020   PFIZER(Purple Top)SARS-COV-2 Vaccination 03/09/2020   Pneumococcal Polysaccharide-23 12/19/2013    Conditions to be addressed/monitored: HTN, HLD, and DMII  Care Plan : Medication Management  Updates made by WBeryle Lathe [place] 01/22/2021 12:00 AM     Problem: HTN, T2DM, HLD   Priority: High  Onset Date: 01/22/2021     Long-Range Goal: Disease Progression Prevention   Start Date: 01/22/2021  Expected End Date: 04/22/2021  This Visit's Progress: On track  Priority: High  Note:   Current Barriers:  Unable to independently monitor therapeutic efficacy Unable to achieve control of hyperlipidemia  Pharmacist Clinical Goal(s):  Patient will Achieve adherence to monitoring guidelines and medication adherence to achieve therapeutic efficacy Achieve control of hyperlipidemia as evidenced by improved LDL and improved triglycerides through collaboration with PharmD and provider.   Interventions: 1:1 collaboration with LKathyrn Drown MD regarding development and update of comprehensive plan of care as evidenced by provider  attestation and co-signature Inter-disciplinary care team collaboration (see longitudinal plan of care) Comprehensive medication review performed; medication list updated in electronic medical record  Type 2 Diabetes - New goal.: Controlled; Most recent A1c at goal of <7% per ADA guidelines Current medications: metformin 1,000 mg by mouth twice daily and canagliflozin (Invokana) 300 mg by mouth once daily Glipizide recently discontinued due to hypoglycemia Intolerances: none Taking medications as directed: yes Side effects thought to be attributed to current medication regimen: no Hypoglycemia prevention: not indicated at this  time Current meal patterns: not discussed today Current exercise: not discussed today On a statin: yes On aspirin 81 mg daily: yes Last microalbumin/creatinine ratio: 180 (12/12/18); on an ACEi/ARB: yes Last eye exam: overdue Last foot exam: completed within last year Pneumonia vaccine: up to date Influenza vaccine: up to date Current glucose readings:  not discussed today Continue current medications as above per PCP  Hypertension - New goal.: Blood pressure under good control. Blood pressure is at goal of <130/80 mmHg per 2017 AHA/ACC guidelines. Has OSA and uses CPAP Current medications: lisinopril 10 mg by mouth once daily, amlodipine 5 mg by mouth once daily, spironolactone 50 mg by mouth once daily, metoprolol succinate 50 mg by mouth once daily, and torsemide 40 mg once daily Intolerances: none Taking medications as directed: yes Side effects thought to be attributed to current medication regimen: no Denies dizziness and lightheadedness. Current home blood pressure: checks periodically usually 130s/70s Encourage dietary sodium restriction/DASH diet Recommend home blood pressure monitoring to discuss at next visit Discussed need for medication compliance Continue current medications as above  Hyperlipidemia - New goal.: Uncontrolled. LDL above goal  of <70 due to very high risk given diabetes + at least 1 additional major risk factor (hypertension and cigarette smoking) per 2020 AACE/ACE guidelines. Triglycerides above goal of <150 per 2020 AACE/ACE guidelines. Current medications: rosuvastatin 20 mg by mouth once daily and fish oil 1-3 capsules by mouth three time daily Patient recently switched from pravastatin to rosuvastatin by PCP due to elevated LDL Intolerances: none Taking medications as directed: yes Side effects thought to be attributed to current medication regimen: no Encourage dietary reduction of high fat containing foods such as butter, nuts, bacon, egg yolks, etc. Continue current medications as above Repeat lipid panel at next PCP visit. If LDL remains elevated above goal, consider adding ezetimibe 10 mg by mouth daily Patient needs to stop smoking. Will discuss more at future visits  Patient Goals/Self-Care Activities Patient will:  Focus on medication adherence by keeping up with prescription refills and either using a pill box or reminders to take your medications at the prescribed times Check blood pressure at least once daily, document, and provide at future appointments  Follow Up Plan: Telephone follow up appointment with care management team member scheduled for: 06/25/21      Medication Assistance: None required.  Patient affirms current coverage meets needs.  Patient's preferred pharmacy is:  [id number] [place] [address]Phone: [phone]: [phone] [facility] [address] [address] [place] [facility] [facility] [id number]: [phone]: [phone] Follow Up:  Patient agrees to Care Plan and Follow-up.  Plan: Telephone follow up appointment with care management team member scheduled for:  06/25/21  CKennon Holter PharmD Clinical Pharmacist [phone]

## 2021-02-03 DIAGNOSIS — I10 Essential (primary) hypertension: Secondary | ICD-10-CM

## 2021-02-03 DIAGNOSIS — E7849 Other hyperlipidemia: Secondary | ICD-10-CM

## 2021-02-03 DIAGNOSIS — E119 Type 2 diabetes mellitus without complications: Secondary | ICD-10-CM

## 2021-02-12 ENCOUNTER — Ambulatory Visit: Payer: PPO | Admitting: Gastroenterology

## 2021-02-19 ENCOUNTER — Other Ambulatory Visit: Payer: Self-pay | Admitting: Family Medicine

## 2021-03-06 ENCOUNTER — Other Ambulatory Visit: Payer: Self-pay | Admitting: Family Medicine

## 2021-03-11 DIAGNOSIS — H40013 Open angle with borderline findings, low risk, bilateral: Secondary | ICD-10-CM | POA: Diagnosis not present

## 2021-03-11 LAB — HM DIABETES EYE EXAM

## 2021-03-12 ENCOUNTER — Other Ambulatory Visit: Payer: Self-pay | Admitting: Family Medicine

## 2021-03-14 NOTE — Telephone Encounter (Signed)
Patient is no longer on pravastatin.  He is on rosuvastatin. Also under our documentation he is on lisinopril 10 mg daily not 40 mg daily. This begs the question what medicines has he been getting from the pharmacy lately? Please find out from pharmacy are they dispensing rosuvastatin or are they dispensing pravastatin? Are they dispensing lisinopril 40 mg or 10 mg? Need to discontinue pravastatin 40 May continue rosuvastatin To follow with lisinopril 10 mg daily not 40 mg daily.  Please connect with pharmacy regarding these issues

## 2021-03-16 NOTE — Telephone Encounter (Signed)
Spoke with pharmacist and she states patient is taking the correct medications. Sometimes when they call in for refills the old ones come over to.

## 2021-03-20 ENCOUNTER — Telehealth: Payer: Self-pay | Admitting: Family Medicine

## 2021-03-20 NOTE — Telephone Encounter (Signed)
Nurses-please reach out to the patient.  He is due for colonoscopy.  Please see if he is willing to do this or Cologuard?  Go ahead and move forward with referral accordingly.  If he refuses please document.

## 2021-03-22 NOTE — Telephone Encounter (Signed)
Left message to return call 

## 2021-03-24 NOTE — Telephone Encounter (Signed)
Patient returned call and was informed per; Nurses-please reach out to the patient.  He is due for colonoscopy.  Please see if he is willing to do this or Cologuard?  Go ahead and move forward with referral accordingly.  If he refuses please document  Patient stated that he is currently under cataract treatment, and will wait until this is complete Then will follow back up with GI to have endoscopy as well as colonoscopy at the same time.

## 2021-04-26 DIAGNOSIS — H25813 Combined forms of age-related cataract, bilateral: Secondary | ICD-10-CM | POA: Diagnosis not present

## 2021-04-26 DIAGNOSIS — H524 Presbyopia: Secondary | ICD-10-CM | POA: Diagnosis not present

## 2021-04-26 DIAGNOSIS — H401431 Capsular glaucoma with pseudoexfoliation of lens, bilateral, mild stage: Secondary | ICD-10-CM | POA: Diagnosis not present

## 2021-05-10 ENCOUNTER — Other Ambulatory Visit: Payer: Self-pay | Admitting: Family Medicine

## 2021-05-30 ENCOUNTER — Other Ambulatory Visit: Payer: Self-pay | Admitting: Family Medicine

## 2021-06-03 ENCOUNTER — Telehealth: Payer: Self-pay | Admitting: *Deleted

## 2021-06-03 ENCOUNTER — Telehealth: Payer: Self-pay | Admitting: Family Medicine

## 2021-06-03 NOTE — Chronic Care Management (AMB) (Signed)
?  Care Management  ? ?Note ? ?06/03/2021 ?Name: Drury Ardizzone. MRN: [id number] DOB: 12-27-1955 ? ?Rawleigh L Sabrina Arriaga. is a 66 y.o. year old male who is a primary care patient of Luking, Elayne Snare, MD and is actively engaged with the care management team. I reached out to [employer]. by phone today to assist with scheduling an initial visit with the RN Case Manager and cancel call with PharmD. ? ?Follow up plan: ?Unsuccessful telephone outreach attempt made. A HIPAA compliant phone message was left for the patient providing contact information and requesting a return call.  ?The care management team will reach out to the patient again over the next 7 days.  ?If patient returns call to provider office, please advise to call [place]  at [phone]. ? ?Laverda Sorenson  ?Care Guide, Embedded Care Coordination ?[place]  Care Management  ?Direct Dial: [phone] ? ?

## 2021-06-03 NOTE — Telephone Encounter (Signed)
?  Left message for patient to call back and schedule Medicare Annual Wellness Visit (AWV) in office.  ? ?If unable to come into the office for AWV,  please offer to do virtually or by telephone. ? ?No hx of AWV eligible for AWVI per [facility] as of 02/05/2019 awvi per [employer]  ? ?Please schedule at anytime with [facility]-Nurse Health Advisor.     ? ?45 minute appointment  ? ?Any questions, please call me at [phone]   ?

## 2021-06-08 ENCOUNTER — Ambulatory Visit: Payer: PPO

## 2021-06-10 NOTE — Chronic Care Management (AMB) (Signed)
?  Care Management  ? ?Note ? ?06/10/2021 ?Name: Shawn [name]. MRN: [id number] DOB: February 07, 1956 ? ?Shawn [name]. is a 66 y.o. year old male who is a primary care patient of Luking, Elayne Snare, MD and is actively engaged with the care management team. I reached out to [employer]. by phone today to assist with scheduling an initial visit with the RN Case Manager ? ?Follow up plan: ?Unsuccessful telephone outreach attempt made. A HIPAA compliant phone message was left for the patient providing contact information and requesting a return call.  ?The care management team will reach out to the patient again over the next 14 days.  ?If patient returns call to provider office, please advise to call [place]  at [phone]. ? ?Laverda Sorenson  ?Care Guide, Embedded Care Coordination ?[place]  Care Management  ?Direct Dial: [phone] ? ?

## 2021-06-18 ENCOUNTER — Encounter: Payer: Self-pay | Admitting: Gastroenterology

## 2021-06-18 ENCOUNTER — Ambulatory Visit (INDEPENDENT_AMBULATORY_CARE_PROVIDER_SITE_OTHER): Payer: PPO | Admitting: Gastroenterology

## 2021-06-18 VITALS — BP 120/70 | HR 56 | Temp 97.7°F | Ht 65.0 in | Wt 223.6 lb

## 2021-06-18 DIAGNOSIS — K219 Gastro-esophageal reflux disease without esophagitis: Secondary | ICD-10-CM | POA: Diagnosis not present

## 2021-06-18 DIAGNOSIS — K7460 Unspecified cirrhosis of liver: Secondary | ICD-10-CM | POA: Diagnosis not present

## 2021-06-18 DIAGNOSIS — Z800 Family history of malignant neoplasm of digestive organs: Secondary | ICD-10-CM | POA: Diagnosis not present

## 2021-06-18 NOTE — Progress Notes (Signed)
? ? ? ?GI Office Note   ? ?Referring Provider: [name], MD ?Primary Care Physician:  [name], MD  ?Primary Gastroenterologist: Garfield Cornea, MD ? ? ?Chief Complaint  ? ?Chief Complaint  ?Patient presents with  ? Follow-up  ?  Cirrhosis. No current issues to discuss.   ? ? ?History of Present Illness  ? ?Shawn [name]. is a 66 y.o. male presenting today for follow-up.  Patient was last seen in March 2022.  He has a history of cirrhosis felt to be related to NASH plus or minus alcohol.  He reports last alcohol consumption 02/2012.  Some intermittent heavy drinking in the past but nothing necessarily on a regular basis per his report.  He also has a history of diabetes. ? ?In early 2021 he had first episode of decompensation, characterized by ascites/lower extremity edema.  First paracentesis March 2021 with no evidence of SBP, cytology negative.  He is immune to hepatitis a and B.  He also has a history of elevated AFP requiring MRI in 2021 that showed no evidence of HCC.  His last labs for [place] was in October 2022, at that time MELD sodium was stable at 11, AFP had ticked upward again at 9.8 (had been 8.5 --> 7.4 ).  He completed MRI in November 2022 which showed cirrhosis, mild splenomegaly, portal venous hypertension, small volume ascites, no evidence of HCC.  Advised to have 47-monthfollow-up labs in January 2023.  He has never had a colonoscopy.  He is due for EGD for esophageal variceal screening.  His mother had colon cancer. ? ?Intermittently, stomach hurts usually associated with diarrhea. Not every week. Has had off/on for years. Was told had one time he had a "nervous stomach". Says he has a lot of anxiety and history of panic attacks and gets stressed about things. Says he does not feel like Valium helps anymore. He sees PCP soon and plans to discuss. Has not been able to pinpoint any foods. No melena, brbpr. No significant heartburn, reflux. Biggest complaint of lack of energy. Chronic  issues with sleep. Uses cpap. Hands are not steady, hard to make writing legible due to hands unsteady. Feels like it could be stress but he is not sure. Has a little tremor around mouth with talking. ? ?Medications  ? ?Current Outpatient Medications  ?Medication Sig Dispense Refill  ? amLODipine (NORVASC) 5 MG tablet TAKE 1 TABLET (5 MG TOTAL) BY MOUTH DAILY. 90 tablet 0  ? ascorbic acid (VITAMIN C) 500 MG tablet Take 500 mg by mouth daily.    ? aspirin EC 81 MG tablet Take 81 mg by mouth daily.    ? blood glucose meter kit and supplies KIT Dispense based on patient and insurance preference. Use daily as directed. (FOR ICD - 10 E11.9) 1 each 5  ? Cholecalciferol 50 MCG (2000 UT) TABS Take 1 tablet by mouth daily.    ? Cyanocobalamin (B-12 PO) Take 1 tablet by mouth daily. 500 mg daily    ? diazepam (VALIUM) 5 MG tablet TAKE 1 TABLET TWICE PER DAY AS NEEDED 60 tablet 5  ? furosemide (LASIX) 20 MG tablet TAKE 2 TABLETS (40 MG TOTAL) BY MOUTH DAILY. 180 tablet 1  ? glucose blood (ONETOUCH VERIO) test strip USE AS INSTRUCTED TO TEST BLOOD SUGAR DAILY 100 strip 0  ? INVOKANA 300 MG TABS tablet TAKE 1 TABLET (300 MG TOTAL) BY MOUTH DAILY BEFORE BREAKFAST. 30 tablet 5  ? Lancets ([facility]  DELICA PLUS [id number]) MISC USE TO OBTAIN A BLOOD SPECIMENT OT TEST BLOOD SUGAR DAILY 100 each 0  ? lisinopril (ZESTRIL) 10 MG tablet TAKE 1 TABLET BY MOUTH EVERY DAY 90 tablet 0  ? metFORMIN (GLUCOPHAGE) 1000 MG tablet Take 1 tablet (1,000 mg total) by mouth 2 (two) times daily. 180 tablet 1  ? metoprolol succinate (TOPROL-XL) 50 MG 24 hr tablet TAKE 1 TABLET BY MOUTH DAILY WITH FOOD 90 tablet 1  ? Milk Thistle 1000 MG CAPS Take by mouth.    ? Omega-3 Fatty Acids (FISH OIL) 1000 MG CAPS Take 1 capsule by mouth in the morning, at noon, and at bedtime.    ? pantoprazole (PROTONIX) 40 MG tablet Take 1 tablet (40 mg total) by mouth daily. 90 tablet 1  ? potassium chloride SA (KLOR-CON M20) 20 MEQ tablet TAKE 1 TABLET BY MOUTH EVERY DAY 90  tablet 0  ? rosuvastatin (CRESTOR) 20 MG tablet Take 1 tablet (20 mg total) by mouth daily. 90 tablet 3  ? spironolactone (ALDACTONE) 50 MG tablet Take 1 tablet (50 mg total) by mouth daily. 90 tablet 1  ? venlafaxine XR (EFFEXOR-XR) 150 MG 24 hr capsule Take 1 capsule (150 mg total) by mouth 2 (two) times daily. 180 capsule 1  ? vitamin E 180 MG (400 UNITS) capsule Take by mouth.    ? zinc gluconate 50 MG tablet Take 50 mg by mouth daily.    ? ?No current facility-administered medications for this visit.  ? ? ?Allergies  ? ?Allergies as of 06/18/2021  ? (No Known Allergies)  ? ?  ?Past Medical History  ? ?Past Medical History:  ?Diagnosis Date  ? ADD (attention deficit disorder)   ? ADHD   ? Anxiety 1987  ? mild (crowds, noises)  ? Anxiety   ? Attention deficit disorder   ? Cancer [facility])   ? Cirrhosis of liver ([place])   ? CVA (cerebral vascular accident) [facility])   ? Depression   ? Diabetic neuropathy ([place])   ? Essential hypertension, benign   ? Long-standing history, negative secondary workup  ? Headache   ? History of cardiac catheterization   ? Widely patent coronary and renal arteries November 2013  ? Hyperlipidemia   ? Hypertension   ? whitecoat  ? Obstructive sleep apnea   ? cpap  ? Prediabetes   ? Sleep apnea   ? TIA (transient ischemic attack)   ? Possible, 2007  ? Type 2 diabetes mellitus ([place])   ? Venous stasis   ? ? ?Past Surgical History  ? ?Past Surgical History:  ?Procedure Laterality Date  ? ANTERIOR CERVICAL DECOMP/DISCECTOMY FUSION N/A 12/30/2015  ? Procedure: ANTERIOR CERVICAL DECOMPRESSION/DISCECTOMY FUSION C6 - C7 1 LEVEL;  Surgeon: Melina Schools, MD;  Location: [facility];  Service: Orthopedics;  Laterality: N/A;  ? CARDIAC CATHETERIZATION  01/2012  ? negative  ? CARPAL TUNNEL RELEASE    ? CERVICAL SPINE NERVE BLOCK  05/17/2017  ? KNEE SURGERY Right   ? NECK SURGERY    ? VASECTOMY    ? ? ?Past Family History  ? ?Family History  ?Problem Relation Age of Onset  ? Colon cancer Mother   ? Hypertension Mother    ? CAD Mother   ? Breast cancer Mother   ? Cirrhosis Brother   ? ? ?Past Social History  ? ?Social History  ? ?Socioeconomic History  ? Marital status: Married  ?  Spouse name: Not on file  ? Number of children:  4  ? Years of education: Not on file  ? Highest education level: Not on file  ?Occupational History  ? Occupation: Barrister's clerk   ?Tobacco Use  ? Smoking status: Never  ? Smokeless tobacco: Current  ?  Types: Snuff  ? Tobacco comments:  ?  Uses dip pouches occasionally  ?Substance and Sexual Activity  ? Alcohol use: No  ?  Alcohol/week: 0.0 standard drinks  ?  Comment: Previously 1-2 cases of beer/week. patient states never on regular basis though. last etoh in 2013.   ? Drug use: No  ? Sexual activity: Not Currently  ?Other Topics Concern  ? Not on file  ?Social History Narrative  ? ** Merged History Encounter **  ?    ? ?Social Determinants of Health  ? ?Financial Resource Strain: Not on file  ?Food Insecurity: Not on file  ?Transportation Needs: Not on file  ?Physical Activity: Not on file  ?Stress: Not on file  ?Social Connections: Not on file  ?Intimate Partner Violence: Not on file  ? ? ?Review of Systems  ? ?General: Negative for anorexia, weight loss, fever, chills,  weakness. See hpi ?ENT: Negative for hoarseness, difficulty swallowing , nasal congestion. ?CV: Negative for chest pain, angina, palpitations, dyspnea on exertion, peripheral edema.  ?Respiratory: Negative for dyspnea at rest, dyspnea on exertion, cough, sputum, wheezing.  ?GI: See history of present illness. ?GU:  Negative for dysuria, hematuria, urinary incontinence, urinary frequency, nocturnal urination.  ?Endo: Negative for unusual weight change.  ?   ?Physical Exam  ? ?BP 120/70 (BP Location: Right Arm, Patient Position: Sitting, Cuff Size: Normal)   Pulse (!) 56   Temp 97.7 ?F (36.5 ?C) (Temporal)   Ht 5' 5"  (3.875 m)   Wt 223 lb 9.6 oz (101.4 kg)   SpO2 100%   BMI 37.21 kg/m?  ?  ?General: Well-nourished, well-developed in no  acute distress.  ?Eyes: No icterus. ?Mouth: Oropharyngeal mucosa moist and pink , no lesions erythema or exudate. Little tremor/shake around the mouth with talking. Dentures in place. ?Lungs: Clear to [employer]

## 2021-06-18 NOTE — Patient Instructions (Signed)
Please have your labs done at [facility] when you go for PCP labs.  ?We will be in touch with results as available. And if you are ready, at that time we will schedule you for upper endoscopy and colonoscopy (which are both highly recommended given your history of cirrhosis and family history of colon cancer).  ?

## 2021-06-22 NOTE — Chronic Care Management (AMB) (Signed)
?  Care Management  ? ?Note ? ?06/22/2021 ?Name: Shawn [name]. MRN: [id number] DOB: 03/16/1955 ? ?Shawn L Exavier Lina. is a 66 y.o. year old male who is a primary care patient of Luking, Elayne Snare, MD and is actively engaged with the care management team. I reached out to [employer]. by phone today to assist with scheduling an initial visit with the RN Case Manager ? ?Follow up plan: ?A third unsuccessful telephone outreach attempt made. A HIPAA compliant phone message was left for the patient providing contact information and requesting a return call.  ?Unable to make contact on outreach attempts x 3. PCP Dr. Wolfgang Phoenix notified via routed documentation in medical record. We have been unable to make contact with the patient for follow up. The care management team is available to follow up with the patient after provider conversation with the patient regarding recommendation for care management engagement and subsequent re-referral to the care management team. If patient returns call to provider office, please advise to call [facility] at [phone]. ? ?Laverda Sorenson  ?Care Guide, Embedded Care Coordination ?[place]  Care Management  ?Direct Dial: [phone] ? ?

## 2021-06-24 ENCOUNTER — Telehealth: Payer: Self-pay | Admitting: *Deleted

## 2021-06-24 NOTE — Chronic Care Management (AMB) (Signed)
?  Care Management  ? ?Note ? ?06/24/2021 ?Name: Shawn [name]. MRN: [id number] DOB: 12-Oct-1955 ? ?Shawn [name]. is a 66 y.o. year old male who is a primary care patient of Luking, Elayne Snare, MD and is actively engaged with the care management team. I reached out to [employer]. by phone today to assist with scheduling an initial visit with the RN Case Manager ? ?Follow up plan: ?Telephone appointment with care management team member scheduled for:07/21/21 ? ?Laverda Sorenson  ?Care Guide, Embedded Care Coordination ?[place]  Care Management  ?Direct Dial: [phone] ? ?

## 2021-06-25 ENCOUNTER — Telehealth: Payer: PPO

## 2021-06-30 ENCOUNTER — Ambulatory Visit (INDEPENDENT_AMBULATORY_CARE_PROVIDER_SITE_OTHER): Payer: PPO | Admitting: Family Medicine

## 2021-06-30 ENCOUNTER — Encounter: Payer: Self-pay | Admitting: Family Medicine

## 2021-06-30 ENCOUNTER — Other Ambulatory Visit: Payer: Self-pay | Admitting: Family Medicine

## 2021-06-30 VITALS — BP 110/70 | HR 59 | Temp 98.2°F | Wt 218.4 lb

## 2021-06-30 DIAGNOSIS — E785 Hyperlipidemia, unspecified: Secondary | ICD-10-CM | POA: Diagnosis not present

## 2021-06-30 DIAGNOSIS — E119 Type 2 diabetes mellitus without complications: Secondary | ICD-10-CM | POA: Diagnosis not present

## 2021-06-30 DIAGNOSIS — E1169 Type 2 diabetes mellitus with other specified complication: Secondary | ICD-10-CM

## 2021-06-30 DIAGNOSIS — I10 Essential (primary) hypertension: Secondary | ICD-10-CM

## 2021-06-30 DIAGNOSIS — Z1211 Encounter for screening for malignant neoplasm of colon: Secondary | ICD-10-CM | POA: Diagnosis not present

## 2021-06-30 MED ORDER — LISINOPRIL 5 MG PO TABS: 5.0000 mg | ORAL_TABLET | Freq: Every day | ORAL | 1 refills | Status: DC

## 2021-06-30 MED ORDER — CLONAZEPAM 0.5 MG PO TABS: ORAL_TABLET | ORAL | 3 refills | Status: DC

## 2021-06-30 MED ORDER — CANAGLIFLOZIN 100 MG PO TABS: 300.0000 mg | ORAL_TABLET | Freq: Every day | ORAL | 1 refills | Status: DC

## 2021-06-30 MED ORDER — BLOOD GLUCOSE METER KIT: PACK | 0 refills | Status: DC

## 2021-06-30 MED ORDER — CANAGLIFLOZIN 100 MG PO TABS: 100.0000 mg | ORAL_TABLET | Freq: Every day | ORAL | 1 refills | Status: DC

## 2021-06-30 NOTE — Progress Notes (Signed)
? ?  Subjective:  ? ? Patient ID: Shawn [name]., male    DOB: [birth year], 66 y.o.   MRN: [id number] ? ?Hypertension ?This is a chronic problem. Risk factors for coronary artery disease include male gender and dyslipidemia. There are no compliance problems.   ?Pt states sugars have been doing ok. Pt states he sweats real bad at times. Has had some low blood sugars when sweating. Checking blood sugars at least once per day. Pt does see eye doctor.  ?It does seem like the patient is having some low sugars ?In addition to this he states at times he skips meals ?He also relates finds himself feeling anxious nervous but denies panic attacks ?States depression under decent control ?Has been taking Valium for years ?This was started well before we started seeing him ?He does not feel it is doing much of anything ?He finds himself feeling restless anxious during the day but he also finds himself having a difficult time sleeping ?We have encouraged him to be seen with psychiatry if he feels like there is ongoing issues but currently right now he would like to just try different medication ? ?Review of Systems ? ?   ?Objective:  ? Physical Exam ?General-in no acute distress ?Eyes-no discharge ?Lungs-respiratory rate normal, CTA ?CV-no murmurs,RRR ?Extremities skin warm dry no edema ?Neuro grossly normal ?Behavior normal, alert ? ? ? ? ?   ?Assessment & Plan:  ?Very nice patient ?Has challenging case because he has been on diazepam for years this was started before I was taking care of him ?He now states that the medication really does not seem to do much for his underlying anxiousness and he has a hard time sleeping ?We have discussed how we could try a different benzodiazepine that he could take during the day and take 2 of them in the evening to try to help with sleep ? ?He also has underlying cirrhosis and needs to have a EGD and colonoscopy, colonoscopy for colon cancer screening EGD to look for varices we will send  referral to GI ? ?1. Essential hypertension, benign ?Blood pressure is on the low end reduce Invokana to 100 mg daily reduce lisinopril to 5 mg daily ?- Hemoglobin A1c ?- Lipid Profile ?- Hepatic function panel ? ?2. Type 2 diabetes mellitus without complication, without long-term current use of insulin ([place]) ?He has had some low sugar spells check lab work.  Reduce Invokana.  Possibly reduce metformin depending on the results of the test ?- Hemoglobin A1c ?- Lipid Profile ?- Hepatic function panel ? ?3. Hyperlipidemia associated with type 2 diabetes mellitus ([place]) ?Continue cholesterol medicine healthy diet ?- Hemoglobin A1c ?- Lipid Profile ?- Hepatic function panel ? ?4. Encounter for screening colonoscopy ?Referral to GI see discussion above ?- Ambulatory referral to Gastroenterology ? ?Insomnia see discussion above ? ?Follow-up 4 months ?

## 2021-06-30 NOTE — Patient Instructions (Addendum)
Hi Shawn [name] ?It was good to see you today. ? ?Please make sure that you share this with your wife-if there is any problems regarding our instructions compared to what you are doing at home with your medications please alert [place]. ? ?Please do your blood work as directed ? ?As we discussed today your blood pressure is doing very well ?I believe you are also having low sugar spells partly related to skipping meals but also partly related to not needing as much medication. ? ?Lisinopril-stop 10 mg-start 5 mg 1 daily ?Invokana-stop 300 mg-start 100 mg daily ? ?As for the sleeping issue it is true you have been on Valium for a long time.  Any of these type of medications the body can get used to.  Per our discussion we will try different medication.  I would recommend stopping Valium. ?In its place I recommend clonazepam 0.5 mg ?Recommend taking 1 during the day, you may take 2 tablets toward evening time hopefully this will help lessen the issue that you have with anxiety plus also help you with your sleep.  If you are ever interested in seeing a mental health expert for this area we can help set this up as a referral. ? ?Please also follow-up with [place] within 4 months. ?

## 2021-07-07 ENCOUNTER — Encounter: Payer: Self-pay | Admitting: Internal Medicine

## 2021-07-15 ENCOUNTER — Other Ambulatory Visit: Payer: Self-pay | Admitting: Family Medicine

## 2021-07-21 ENCOUNTER — Ambulatory Visit (INDEPENDENT_AMBULATORY_CARE_PROVIDER_SITE_OTHER): Payer: PPO | Admitting: *Deleted

## 2021-07-21 DIAGNOSIS — I10 Essential (primary) hypertension: Secondary | ICD-10-CM

## 2021-07-21 DIAGNOSIS — E119 Type 2 diabetes mellitus without complications: Secondary | ICD-10-CM

## 2021-07-21 NOTE — Chronic Care Management (AMB) (Signed)
Chronic Care Management   CCM RN Visit Note  07/21/2021 Name: Shawn [name]. MRN: [id number] DOB: 23-Feb-1956  Subjective: Shawn [name]. is a 66 y.o. year old male who is a primary care patient of Luking, Elayne Snare, MD. The care management team was consulted for assistance with disease management and care coordination needs.    Engaged with patient by telephone for initial visit in response to provider referral for case management and/or care coordination services.   Consent to Services:  The patient was given the following information about Chronic Care Management services today, agreed to services, and gave verbal consent: 1. CCM service includes personalized support from designated clinical staff supervised by the primary care provider, including individualized plan of care and coordination with other care providers 2. 24/7 contact phone numbers for assistance for urgent and routine care needs. 3. Service will only be billed when office clinical staff spend 20 minutes or more in a month to coordinate care. 4. Only one practitioner may furnish and bill the service in a calendar month. 5.The patient may stop CCM services at any time (effective at the end of the month) by phone call to the office staff. 6. The patient will be responsible for cost sharing (co-pay) of up to 20% of the service fee (after annual deductible is met). Patient agreed to services and consent obtained.  Patient agreed to services and verbal consent obtained.   Assessment: Review of patient past medical history, allergies, medications, health status, including review of consultants reports, laboratory and other test data, was performed as part of comprehensive evaluation and provision of chronic care management services.   SDOH (Social Determinants of Health) assessments and interventions performed:  SDOH Interventions    Flowsheet Row Most Recent Value  SDOH Interventions   Food Insecurity Interventions  Intervention Not Indicated  Transportation Interventions Intervention Not Indicated        CCM Care Plan  No Known Allergies  Outpatient Encounter Medications as of 07/21/2021  Medication Sig   amLODipine (NORVASC) 5 MG tablet TAKE 1 TABLET (5 MG TOTAL) BY MOUTH DAILY.   ascorbic acid (VITAMIN C) 500 MG tablet Take 500 mg by mouth daily.   aspirin EC 81 MG tablet Take 81 mg by mouth daily.   blood glucose meter kit and supplies KIT Dispense based on patient and insurance preference. Use daily as directed. (FOR ICD - 10 E11.9)   blood glucose meter kit and supplies Dispense based on patient and insurance preference. Use to check sugars once per day. Dx.E11.9   canagliflozin (INVOKANA) 100 MG TABS tablet Take 1 tablet (100 mg total) by mouth daily before breakfast.   Cholecalciferol 50 MCG (2000 UT) TABS Take 1 tablet by mouth daily.   clonazePAM (KLONOPIN) 0.5 MG tablet 1 during the day and 2 near bedtime   Cyanocobalamin (B-12 PO) Take 1 tablet by mouth daily. 500 mg daily   furosemide (LASIX) 20 MG tablet TAKE 2 TABLETS (40 MG TOTAL) BY MOUTH DAILY.   glucose blood (ONETOUCH VERIO) test strip USE AS INSTRUCTED TO TEST BLOOD SUGAR DAILY   Lancets (ONETOUCH DELICA PLUS [id number]) MISC USE TO OBTAIN A BLOOD SPECIMENT OT TEST BLOOD SUGAR DAILY   lisinopril (ZESTRIL) 5 MG tablet Take 1 tablet (5 mg total) by mouth daily.   metFORMIN (GLUCOPHAGE) 1000 MG tablet Take 1 tablet (1,000 mg total) by mouth 2 (two) times daily.   metoprolol succinate (TOPROL-XL) 50 MG 24 hr tablet TAKE 1 TABLET BY  MOUTH DAILY WITH FOOD   Milk Thistle 1000 MG CAPS Take by mouth.   Omega-3 Fatty Acids (FISH OIL) 1000 MG CAPS Take 1 capsule by mouth in the morning, at noon, and at bedtime.   pantoprazole (PROTONIX) 40 MG tablet Take 1 tablet (40 mg total) by mouth daily.   potassium chloride SA (KLOR-CON M20) 20 MEQ tablet TAKE 1 TABLET BY MOUTH EVERY DAY   rosuvastatin (CRESTOR) 20 MG tablet Take 1 tablet (20 mg  total) by mouth daily.   spironolactone (ALDACTONE) 50 MG tablet Take 1 tablet (50 mg total) by mouth daily.   venlafaxine XR (EFFEXOR-XR) 150 MG 24 hr capsule Take 1 capsule (150 mg total) by mouth 2 (two) times daily.   vitamin E 180 MG (400 UNITS) capsule Take by mouth.   zinc gluconate 50 MG tablet Take 50 mg by mouth daily.   No facility-administered encounter medications on file as of 07/21/2021.    Patient Active Problem List   Diagnosis Date Noted   Cirrhosis of liver ([place])    FH: colon cancer    GAD (generalized anxiety disorder) 12/02/2019   Ascites 05/29/2019   GERD (gastroesophageal reflux disease) 06/15/2017   Neck pain 12/30/2015   Morbid obesity ([place]) 08/13/2015   Cognitive dysfunction 03/19/2015   Cirrhosis, non-alcoholic ([place]) 62/83/6629   Elevated LFTs 12/11/2014   Abnormal abdominal ultrasound 12/11/2014   ADD (attention deficit disorder) 05/02/2014   Fatty liver 12/19/2013   Type II diabetes mellitus, uncontrolled 12/19/2013   Fatigue 11/19/2013   History of cardiac catheterization 11/19/2013   Hyperlipidemia associated with type 2 diabetes mellitus ([place]) 09/12/2012   Obstructive sleep apnea 09/12/2012   Essential hypertension, benign 01/13/2012   Type 2 diabetes mellitus ([place]) 01/13/2012    Conditions to be addressed/monitored:HTN and DMII  Care Plan : RN Care Manager Plan of Care  Updates made by Kassie Mends, RN since 07/21/2021 12:00 AM     Problem: No plan of care established for management of chronic disease state  (DM2, HTN)   Priority: High     Long-Range Goal: Development of plan of care for chronic disease management  (DM2, HTN)   Start Date: 07/21/2021  Expected End Date: 01/17/2022  Priority: High  Note:   Current Barriers:  Knowledge Deficits related to plan of care for management of HTN and DMII  Chronic Disease Management support and education needs related to HTN and DMII No Advanced Directives in place- patient requests  information be mailed Patient reports he lives with spouse, is independent with all aspects of his care, continues to drive, spouse assists with oversight of medications, pt states he checks CBG once daily with readings in low 100's with today's reading 110, states " trying to eat healthy as I can", reports checks blood pressure once weekly and "readings have all been good", pt exercises some.  Patient reports cirrhosis seems to be under good control at present, has not had paracentesis in approximately 2 years.  RNCM Clinical Goal(s):  Patient will verbalize understanding of plan for management of HTN and DMII as evidenced by patient report  through collaboration with RN Care manager, provider, and care team.   Interventions: 1:1 collaboration with primary care provider regarding development and update of comprehensive plan of care as evidenced by provider attestation and co-signature Inter-disciplinary care team collaboration (see longitudinal plan of care) Evaluation of current treatment plan related to  self management and patient's adherence to plan as established by provider   Diabetes  Interventions:  (Status:  New goal. and Goal on track:  Yes.) Long Term Goal Assessed patient's understanding of A1c goal: <7% Provided education to patient about basic DM disease process Reviewed medications with patient and discussed importance of medication adherence Discussed plans with patient for ongoing care management follow up and provided patient with direct contact information for care management team Provided patient with written educational materials related to hypo and hyperglycemia and importance of correct treatment Review of patient status, including review of consultants reports, relevant laboratory and other test results, and medications completed Screening for signs and symptoms of depression related to chronic disease state  Assessed social determinant of health barriers Reviewed  carbohydrate modified diet Education provided via My Chart- hypoglycemia Lab Results  Component Value Date   HGBA1C 6.3 (H) 12/28/2020   Hypertension Interventions:  (Status:  New goal. and Goal on track:  Yes.) Long Term Goal Last practice recorded BP readings:  BP Readings from Last 3 Encounters:  06/30/21 110/70  06/18/21 120/70  12/30/20 118/68  Most recent eGFR/CrCl:  Lab Results  Component Value Date   EGFR 65 12/28/2020    No components found for: CRCL  Evaluation of current treatment plan related to hypertension self management and patient's adherence to plan as established by provider Reviewed medications with patient and discussed importance of compliance Discussed plans with patient for ongoing care management follow up and provided patient with direct contact information for care management team Provided education on prescribed diet low sodium Discussed complications of poorly controlled blood pressure such as heart disease, stroke, circulatory complications, vision complications, kidney impairment, sexual dysfunction  Education provided via My Chart- low sodium diet Advanced directives packet mailed  Patient Goals/Self-Care Activities: Take medications as prescribed   Attend all scheduled provider appointments Call pharmacy for medication refills 3-7 days in advance of running out of medications Perform all self care activities independently  Perform IADL's (shopping, preparing meals, housekeeping, managing finances) independently Call provider office for new concerns or questions  check blood sugar at prescribed times: once daily check feet daily for cuts, sores or redness enter blood sugar readings and medication or insulin into daily log take the blood sugar log to all doctor visits take the blood sugar meter to all doctor visits limit fast food meals to no more than 1 per week read food labels for fat, fiber, carbohydrates and portion size check blood pressure  weekly write blood pressure results in a log or diary take blood pressure log to all doctor appointments call doctor for signs and symptoms of high blood pressure eat more whole grains, fruits and vegetables, lean meats and healthy fats Look over education sent via My Chart- low sodium diet and hypoglycemia Advanced directives packet mailed- please look over and complete        Plan:Telephone follow up appointment with care management team member scheduled for:  09/29/21  Jacqlyn Larsen [facility], BSN RN Case Manager [place] [phone]

## 2021-07-21 NOTE — Patient Instructions (Signed)
Visit Information  ? ?Thank you for taking time to visit with me today. Please don't hesitate to contact me if I can be of assistance to you before our next scheduled telephone appointment. ? ?Following are the goals we discussed today:  ?Attend all scheduled provider appointments ?Call pharmacy for medication refills 3-7 days in advance of running out of medications ?Perform all self care activities independently  ?Perform IADL's (shopping, preparing meals, housekeeping, managing finances) independently ?Call provider office for new concerns or questions  ?check blood sugar at prescribed times: once daily ?check feet daily for cuts, sores or redness ?enter blood sugar readings and medication or insulin into daily log ?take the blood sugar log to all doctor visits ?take the blood sugar meter to all doctor visits ?limit fast food meals to no more than 1 per week ?read food labels for fat, fiber, carbohydrates and portion size ?check blood pressure weekly ?write blood pressure results in a log or diary ?take blood pressure log to all doctor appointments ?call doctor for signs and symptoms of high blood pressure ?eat more whole grains, fruits and vegetables, lean meats and healthy fats ?Look over education sent via My Chart- low sodium diet and hypoglycemia ?Advanced directives packet mailed- please look over and complete ? ?Our next appointment is by telephone on 09/29/21 at 215 pm ? ?Please call the care guide team at [phone] if you need to cancel or reschedule your appointment.  ? ?If you are experiencing a Mental Health or [place] or need someone to talk to, please call the Suicide and Crisis Lifeline: 988 ?call the [place] National Suicide Prevention Lifeline: 281-045-6103 or TTY: 513-524-7801 TTY 570-245-5427) to talk to a trained counselor ?call 1-800-273-TALK (toll free, 24 hour hotline) ?go to [facility] [address], [place] [phone]) ?call  the [facility] [place] [facility]: [phone] ?call 911  ? ?Following is a copy of your full care plan:  ?Care Plan : [place] of Care  ?Updates made by Kassie Mends, RN since 07/21/2021 12:00 AM  ?  ? ?Problem: No plan of care established for management of chronic disease state  (DM2, HTN)   ?Priority: High  ?  ? ?Long-Range Goal: Development of plan of care for chronic disease management  (DM2, HTN)   ?Start Date: 07/21/2021  ?Expected End Date: 01/17/2022  ?Priority: High  ?Note:   ?Current Barriers:  ?Knowledge Deficits related to plan of care for management of HTN and DMII  ?Chronic Disease Management support and education needs related to HTN and DMII ?No Advanced Directives in place- patient requests information be mailed ?Patient reports he lives with spouse, is independent with all aspects of his care, continues to drive, spouse assists with oversight of medications, pt states he checks CBG once daily with readings in low 100's with today's reading 110, states " trying to eat healthy as I can", reports checks blood pressure once weekly and "readings have all been good", pt exercises some.  Patient reports cirrhosis seems to be under good control at present, has not had paracentesis in approximately 2 years. ? ?RNCM Clinical Goal(s):  ?Patient will verbalize understanding of plan for management of HTN and DMII as evidenced by patient report  through collaboration with RN Care manager, provider, and care team.  ? ?Interventions: ?1:1 collaboration with primary care provider regarding development and update of comprehensive plan of care as evidenced by provider attestation and co-signature ?Inter-disciplinary care team collaboration (see longitudinal  plan of care) ?Evaluation of current treatment plan related to  self management and patient's adherence to plan as established by provider ? ? ?Diabetes Interventions:  (Status:  New goal. and Goal on track:  Yes.) Long Term Goal ?Assessed patient's  understanding of A1c goal: <7% ?Provided education to patient about basic DM disease process ?Reviewed medications with patient and discussed importance of medication adherence ?Discussed plans with patient for ongoing care management follow up and provided patient with direct contact information for care management team ?Provided patient with written educational materials related to hypo and hyperglycemia and importance of correct treatment ?Review of patient status, including review of consultants reports, relevant laboratory and other test results, and medications completed ?Screening for signs and symptoms of depression related to chronic disease state  ?Assessed social determinant of health barriers ?Reviewed carbohydrate modified diet ?Education provided via My Chart- hypoglycemia ?Lab Results  ?Component Value Date  ? HGBA1C 6.3 (H) 12/28/2020  ? ?Hypertension Interventions:  (Status:  New goal. and Goal on track:  Yes.) Long Term Goal ?Last practice recorded BP readings:  ?BP Readings from Last 3 Encounters:  ?06/30/21 110/70  ?06/18/21 120/70  ?12/30/20 118/68  ?Most recent eGFR/CrCl:  ?Lab Results  ?Component Value Date  ? EGFR 65 12/28/2020  ?  No components found for: CRCL ? ?Evaluation of current treatment plan related to hypertension self management and patient's adherence to plan as established by provider ?Reviewed medications with patient and discussed importance of compliance ?Discussed plans with patient for ongoing care management follow up and provided patient with direct contact information for care management team ?Provided education on prescribed diet low sodium ?Discussed complications of poorly controlled blood pressure such as heart disease, stroke, circulatory complications, vision complications, kidney impairment, sexual dysfunction  ?Education provided via My Chart- low sodium diet ?Advanced directives packet mailed ? ?Patient Goals/Self-Care Activities: ?Take medications as prescribed    ?Attend all scheduled provider appointments ?Call pharmacy for medication refills 3-7 days in advance of running out of medications ?Perform all self care activities independently  ?Perform IADL's (shopping, preparing meals, housekeeping, managing finances) independently ?Call provider office for new concerns or questions  ?check blood sugar at prescribed times: once daily ?check feet daily for cuts, sores or redness ?enter blood sugar readings and medication or insulin into daily log ?take the blood sugar log to all doctor visits ?take the blood sugar meter to all doctor visits ?limit fast food meals to no more than 1 per week ?read food labels for fat, fiber, carbohydrates and portion size ?check blood pressure weekly ?write blood pressure results in a log or diary ?take blood pressure log to all doctor appointments ?call doctor for signs and symptoms of high blood pressure ?eat more whole grains, fruits and vegetables, lean meats and healthy fats ?Look over education sent via My Chart- low sodium diet and hypoglycemia ?Advanced directives packet mailed- please look over and complete ? ?  ?  ? ? ?Consent to CCM Services: ?Mr. Crisp was given information about Chronic Care Management services including:  ?CCM service includes personalized support from designated clinical staff supervised by his physician, including individualized plan of care and coordination with other care providers ?24/7 contact phone numbers for assistance for urgent and routine care needs. ?Service will only be billed when office clinical staff spend 20 minutes or more in a month to coordinate care. ?Only one practitioner may furnish and bill the service in a calendar month. ?The patient may stop CCM services at  any time (effective at the end of the month) by phone call to the office staff. ?The patient will be responsible for cost sharing (co-pay) of up to 20% of the service fee (after annual deductible is met). ? ?Patient agreed to services  and verbal consent obtained.  ? ?Patient verbalizes understanding of instructions and care plan provided today and agrees to view in [place]. Active MyChart status and patient understanding of how t

## 2021-07-24 ENCOUNTER — Other Ambulatory Visit: Payer: Self-pay | Admitting: Family Medicine

## 2021-08-03 ENCOUNTER — Ambulatory Visit: Payer: PPO | Admitting: Gastroenterology

## 2021-08-04 DIAGNOSIS — E119 Type 2 diabetes mellitus without complications: Secondary | ICD-10-CM | POA: Diagnosis not present

## 2021-08-04 DIAGNOSIS — I10 Essential (primary) hypertension: Secondary | ICD-10-CM | POA: Diagnosis not present

## 2021-08-04 DIAGNOSIS — F17220 Nicotine dependence, chewing tobacco, uncomplicated: Secondary | ICD-10-CM

## 2021-08-09 ENCOUNTER — Other Ambulatory Visit: Payer: Self-pay | Admitting: Family Medicine

## 2021-08-26 ENCOUNTER — Encounter: Payer: Self-pay | Admitting: *Deleted

## 2021-09-03 ENCOUNTER — Other Ambulatory Visit: Payer: Self-pay | Admitting: Family Medicine

## 2021-09-20 ENCOUNTER — Other Ambulatory Visit: Payer: Self-pay | Admitting: Family Medicine

## 2021-09-22 ENCOUNTER — Ambulatory Visit (INDEPENDENT_AMBULATORY_CARE_PROVIDER_SITE_OTHER): Payer: PPO | Admitting: *Deleted

## 2021-09-22 DIAGNOSIS — I10 Essential (primary) hypertension: Secondary | ICD-10-CM

## 2021-09-22 DIAGNOSIS — E119 Type 2 diabetes mellitus without complications: Secondary | ICD-10-CM

## 2021-09-22 NOTE — Chronic Care Management (AMB) (Signed)
Chronic Care Management   CCM RN Visit Note  09/22/2021 Name: Shawn [name]. MRN: [id number] DOB: January 20, 1956  Subjective: Shawn [name]. is a 66 y.o. year old male who is a primary care patient of Luking, Elayne Snare, MD. The care management team was consulted for assistance with disease management and care coordination needs.    Engaged with patient by telephone for follow up visit in response to provider referral for case management and/or care coordination services.   Consent to Services:  The patient was given information about Chronic Care Management services, agreed to services, and gave verbal consent prior to initiation of services.  Please see initial visit note for detailed documentation.   Patient agreed to services and verbal consent obtained.   Assessment: Review of patient past medical history, allergies, medications, health status, including review of consultants reports, laboratory and other test data, was performed as part of comprehensive evaluation and provision of chronic care management services.   SDOH (Social Determinants of Health) assessments and interventions performed:    CCM Care Plan  No Known Allergies  Outpatient Encounter Medications as of 09/22/2021  Medication Sig   amLODipine (NORVASC) 5 MG tablet TAKE 1 TABLET (5 MG TOTAL) BY MOUTH DAILY.   ascorbic acid (VITAMIN C) 500 MG tablet Take 500 mg by mouth daily.   aspirin EC 81 MG tablet Take 81 mg by mouth daily.   blood glucose meter kit and supplies KIT Dispense based on patient and insurance preference. Use daily as directed. (FOR ICD - 10 E11.9)   blood glucose meter kit and supplies Dispense based on patient and insurance preference. Use to check sugars once per day. Dx.E11.9   canagliflozin (INVOKANA) 100 MG TABS tablet Take 1 tablet (100 mg total) by mouth daily before breakfast.   Cholecalciferol 50 MCG (2000 UT) TABS Take 1 tablet by mouth daily.   clonazePAM (KLONOPIN) 0.5 MG tablet 1  during the day and 2 near bedtime   Cyanocobalamin (B-12 PO) Take 1 tablet by mouth daily. 500 mg daily   furosemide (LASIX) 20 MG tablet TAKE 2 TABLETS (40 MG TOTAL) BY MOUTH DAILY.   glucose blood (ONETOUCH VERIO) test strip USE AS INSTRUCTED TO TEST BLOOD SUGAR DAILY   Lancets (ONETOUCH DELICA PLUS [id number]) MISC USE TO OBTAIN A BLOOD SPECIMENT OT TEST BLOOD SUGAR DAILY   lisinopril (ZESTRIL) 10 MG tablet TAKE 1 TABLET BY MOUTH EVERY DAY   lisinopril (ZESTRIL) 5 MG tablet Take 1 tablet (5 mg total) by mouth daily.   metFORMIN (GLUCOPHAGE) 1000 MG tablet Take 1 tablet (1,000 mg total) by mouth 2 (two) times daily.   metoprolol succinate (TOPROL-XL) 50 MG 24 hr tablet TAKE 1 TABLET BY MOUTH DAILY WITH FOOD   Milk Thistle 1000 MG CAPS Take by mouth.   Omega-3 Fatty Acids (FISH OIL) 1000 MG CAPS Take 1 capsule by mouth in the morning, at noon, and at bedtime.   pantoprazole (PROTONIX) 40 MG tablet TAKE 1 TABLET BY MOUTH EVERY DAY   potassium chloride SA (KLOR-CON M20) 20 MEQ tablet TAKE 1 TABLET BY MOUTH EVERY DAY   rosuvastatin (CRESTOR) 20 MG tablet Take 1 tablet (20 mg total) by mouth daily.   spironolactone (ALDACTONE) 50 MG tablet Take 1 tablet (50 mg total) by mouth daily.   venlafaxine XR (EFFEXOR-XR) 150 MG 24 hr capsule TAKE 1 CAPSULE BY MOUTH TWICE A DAY   vitamin E 180 MG (400 UNITS) capsule Take by mouth.   zinc  gluconate 50 MG tablet Take 50 mg by mouth daily.   No facility-administered encounter medications on file as of 09/22/2021.    Patient Active Problem List   Diagnosis Date Noted   Cirrhosis of liver ([place])    FH: colon cancer    GAD (generalized anxiety disorder) 12/02/2019   Ascites 05/29/2019   GERD (gastroesophageal reflux disease) 06/15/2017   Neck pain 12/30/2015   Morbid obesity ([place]) 08/13/2015   Cognitive dysfunction 03/19/2015   Cirrhosis, non-alcoholic ([place]) 63/84/6659   Elevated LFTs 12/11/2014   Abnormal abdominal ultrasound 12/11/2014   ADD  (attention deficit disorder) 05/02/2014   Fatty liver 12/19/2013   Type II diabetes mellitus, uncontrolled 12/19/2013   Fatigue 11/19/2013   History of cardiac catheterization 11/19/2013   Hyperlipidemia associated with type 2 diabetes mellitus ([place]) 09/12/2012   Obstructive sleep apnea 09/12/2012   Essential hypertension, benign 01/13/2012   Type 2 diabetes mellitus ([place]) 01/13/2012    Conditions to be addressed/monitored:HTN and DMII  Care Plan : RN Care Manager Plan of Care  Updates made by Kassie Mends, RN since 09/22/2021 12:00 AM  Completed 09/22/2021   Problem: No plan of care established for management of chronic disease state  (DM2, HTN) Resolved 09/22/2021  Priority: High     Long-Range Goal: Development of plan of care for chronic disease management  (DM2, HTN) Completed 09/22/2021  Start Date: 07/21/2021  Expected End Date: 01/17/2022  Priority: High  Note:   Current Barriers:  Knowledge Deficits related to plan of care for management of HTN and DMII  Chronic Disease Management support and education needs related to HTN and DMII No Advanced Directives in place- patient requests information be mailed Patient reports he lives with spouse, is independent with all aspects of his care, continues to drive, spouse assists with oversight of medications, pt states he checks CBG once daily with readings in low 100's, states " trying to eat healthy as I can", reports checks blood pressure once weekly and "readings have all been good", pt exercises some.  Patient reports cirrhosis seems to be under good control at present, has not had paracentesis in approximately 2 years, no new concerns reported today.  RNCM Clinical Goal(s):  Patient will verbalize understanding of plan for management of HTN and DMII as evidenced by patient report  through collaboration with RN Care manager, provider, and care team.   Interventions: 1:1 collaboration with primary care provider regarding  development and update of comprehensive plan of care as evidenced by provider attestation and co-signature Inter-disciplinary care team collaboration (see longitudinal plan of care) Evaluation of current treatment plan related to  self management and patient's adherence to plan as established by provider   Diabetes Interventions:  (Status:  New goal. and Goal on track:  Yes.) Long Term Goal Assessed patient's understanding of A1c goal: <7% Provided education to patient about basic DM disease process Reviewed medications with patient and discussed importance of medication adherence Discussed plans with patient for ongoing care management follow up and provided patient with direct contact information for care management team Provided patient with written educational materials related to hypo and hyperglycemia and importance of correct treatment Review of patient status, including review of consultants reports, relevant laboratory and other test results, and medications completed Reinforced carbohydrate modified diet Reviewed plan of care with patient including case closure today Lab Results  Component Value Date   HGBA1C 6.3 (H) 12/28/2020   Hypertension Interventions:  (Status:  New goal. and Goal on track:  Yes.) Long Term Goal Last practice recorded BP readings:  BP Readings from Last 3 Encounters:  06/30/21 110/70  06/18/21 120/70  12/30/20 118/68  Most recent eGFR/CrCl:  Lab Results  Component Value Date   EGFR 65 12/28/2020    No components found for: CRCL  Evaluation of current treatment plan related to hypertension self management and patient's adherence to plan as established by provider Reviewed medications with patient and discussed importance of compliance Discussed plans with patient for ongoing care management follow up and provided patient with direct contact information for care management team Provided education on prescribed diet low sodium Discussed complications of  poorly controlled blood pressure such as heart disease, stroke, circulatory complications, vision complications, kidney impairment, sexual dysfunction  Encouraged patient to complete advanced directives  Patient Goals/Self-Care Activities: Take medications as prescribed   Attend all scheduled provider appointments Call pharmacy for medication refills 3-7 days in advance of running out of medications Perform all self care activities independently  Perform IADL's (shopping, preparing meals, housekeeping, managing finances) independently Call provider office for new concerns or questions  check blood sugar at prescribed times: once daily check feet daily for cuts, sores or redness enter blood sugar readings and medication or insulin into daily log take the blood sugar log to all doctor visits take the blood sugar meter to all doctor visits trim toenails straight across fill half of plate with vegetables limit fast food meals to no more than 1 per week read food labels for fat, fiber, carbohydrates and portion size keep feet up while sitting wash and dry feet carefully every day wear comfortable, cotton socks wear comfortable, well-fitting shoes check blood pressure weekly write blood pressure results in a log or diary take blood pressure log to all doctor appointments call doctor for signs and symptoms of high blood pressure eat more whole grains, fruits and vegetables, lean meats and healthy fats Follow low sodium diet Look over and complete Advanced directives  Case closure today        Plan:No further follow up required: case closure today  Jacqlyn Larsen [facility], BSN RN Case Manager [place] [phone]

## 2021-09-22 NOTE — Patient Instructions (Signed)
Visit Information  Thank you for taking time to visit with me today. Please don't hesitate to contact me if I can be of assistance to you before our next scheduled telephone appointment.  Following are the goals we discussed today:  Take medications as prescribed   Attend all scheduled provider appointments Call pharmacy for medication refills 3-7 days in advance of running out of medications Perform all self care activities independently  Perform IADL's (shopping, preparing meals, housekeeping, managing finances) independently Call provider office for new concerns or questions  check blood sugar at prescribed times: once daily check feet daily for cuts, sores or redness enter blood sugar readings and medication or insulin into daily log take the blood sugar log to all doctor visits take the blood sugar meter to all doctor visits trim toenails straight across fill half of plate with vegetables limit fast food meals to no more than 1 per week read food labels for fat, fiber, carbohydrates and portion size keep feet up while sitting wash and dry feet carefully every day wear comfortable, cotton socks wear comfortable, well-fitting shoes check blood pressure weekly write blood pressure results in a log or diary take blood pressure log to all doctor appointments call doctor for signs and symptoms of high blood pressure eat more whole grains, fruits and vegetables, lean meats and healthy fats Follow low sodium diet Look over and complete Advanced directives  Case closure today   Please call the care guide team at [phone] if you need to cancel or reschedule your appointment.   If you are experiencing a Mental Health or [place] or need someone to talk to, please call the Suicide and Crisis Lifeline: 988 call the [place] National Suicide Prevention Lifeline: (304) 578-9832 or TTY: 681-439-6204 TTY 4071585679) to talk to a trained counselor call 1-800-273-TALK (toll  free, 24 hour hotline) go to [facility] [address], [place] [phone]) call the [place]: [phone] call 911   Patient verbalizes understanding of instructions and care plan provided today and agrees to view in [place]. Active MyChart status and patient understanding of how to access instructions and care plan via MyChart confirmed with patient.     Jacqlyn Larsen [facility], BSN RN Case Manager [facility] [phone]

## 2021-09-29 ENCOUNTER — Telehealth: Payer: Self-pay

## 2021-10-01 ENCOUNTER — Other Ambulatory Visit: Payer: Self-pay | Admitting: Family Medicine

## 2021-10-04 DIAGNOSIS — E119 Type 2 diabetes mellitus without complications: Secondary | ICD-10-CM

## 2021-10-04 DIAGNOSIS — I10 Essential (primary) hypertension: Secondary | ICD-10-CM | POA: Diagnosis not present

## 2021-10-09 ENCOUNTER — Other Ambulatory Visit: Payer: Self-pay | Admitting: Family Medicine

## 2021-10-12 ENCOUNTER — Telehealth: Payer: Self-pay

## 2021-10-12 NOTE — Telephone Encounter (Signed)
Pt called stating that his stomach has been swollen and thinks that he may have fluid on it again. I informed pt that you were out until Thursday and that I could send this message to another provider and he insisted on waiting till you returned.

## 2021-10-13 NOTE — Telephone Encounter (Signed)
Pt was reminded about labs and was scheduled to see [facility] tomorrow 10/14/21.

## 2021-10-13 NOTE — Telephone Encounter (Signed)
Patient is overdue for my labs ordered in 06/2021, need him to get those done asap. Everything has been on hold since 06/2021 pending labs which he has not had done.   Would also advise him to be seen in next 24 hours or so to assess for fluid as he has not required paracentesis since 2021.

## 2021-10-13 NOTE — Progress Notes (Unsigned)
Referring Provider: [name], MD Primary Care Physician:  [name], MD Primary GI Physician: Dr. Gala Romney  No chief complaint on file.   HPI:   Shawn [name]. is a 66 y.o. male with a history of GERD, cirrhosis felt to be related to NASH +/- alcohol with history of intermittent heavy alcohol drinking in the past, but no alcohol consumption since December 2013. He also has a history of elevated AFP requiring MRI in 2021 that showed no evidence of HCC.   MRI in November 2022 which showed cirrhosis, mild splenomegaly, portal venous hypertension, small volume ascites, no evidence of HCC.  He is immune to Hep A/B.   He is presenting today for an acute visit with chief complaint of ***  Previously with an episode of decompensation in early 2021 with ascites/lower extremity edema.   First paracentesis March 2021 with no evidence of SBP, cytology negative.  He has been maintained on Lasix 40 mg daily, spironolactone 50 mg daily, and had been doing well.  Last seen in our office 06/18/2021 with no ascites or LE edema at that time.  Recommended updating routine labs.  Also recommended EGD and colonoscopy, but patient preferred to hold off until completing labs.  Blood work has not been completed.  Patient called 8/9 stating his stomach was swollen and thinks he may have fluid on it. Recommend OV for further evaluation.   Today:   Last labs in October 2022 with MELD Na 11. AFP 9.8 at that time.   No prior EGD/TCS *** No prior colonoscopy.  Mother with history of colon cancer.  Past Medical History:  Diagnosis Date   ADD (attention deficit disorder)    ADHD    Anxiety 1987   mild (crowds, noises)   Anxiety    Attention deficit disorder    Cancer (HCC)    Cirrhosis of liver (HCC)    CVA (cerebral vascular accident) ([place])    Depression    Diabetic neuropathy ([place])    Essential hypertension, benign    Long-standing history, negative secondary workup   Headache    History  of cardiac catheterization    Widely patent coronary and renal arteries November 2013   Hyperlipidemia    Hypertension    whitecoat   Obstructive sleep apnea    cpap   Prediabetes    Sleep apnea    TIA (transient ischemic attack)    Possible, 2007   Type 2 diabetes mellitus (HCC)    Venous stasis     Past Surgical History:  Procedure Laterality Date   ANTERIOR CERVICAL DECOMP/DISCECTOMY FUSION N/A 12/30/2015   Procedure: ANTERIOR CERVICAL DECOMPRESSION/DISCECTOMY FUSION C6 - C7 1 LEVEL;  Surgeon: Melina Schools, MD;  Location: [facility];  Service: Orthopedics;  Laterality: N/A;   CARDIAC CATHETERIZATION  01/2012   negative   CARPAL TUNNEL RELEASE     CERVICAL SPINE NERVE BLOCK  05/17/2017   KNEE SURGERY Right    NECK SURGERY     VASECTOMY      Current Outpatient Medications  Medication Sig Dispense Refill   amLODipine (NORVASC) 5 MG tablet TAKE 1 TABLET (5 MG TOTAL) BY MOUTH DAILY. 90 tablet 0   ascorbic acid (VITAMIN C) 500 MG tablet Take 500 mg by mouth daily.     aspirin EC 81 MG tablet Take 81 mg by mouth daily.     blood glucose meter kit and supplies KIT Dispense based on patient and insurance preference. Use daily  as directed. (FOR ICD - 10 E11.9) 1 each 5   blood glucose meter kit and supplies Dispense based on patient and insurance preference. Use to check sugars once per day. Dx.E11.9 1 each 0   canagliflozin (INVOKANA) 100 MG TABS tablet Take 1 tablet (100 mg total) by mouth daily before breakfast. 90 tablet 1   Cholecalciferol 50 MCG (2000 UT) TABS Take 1 tablet by mouth daily.     clonazePAM (KLONOPIN) 0.5 MG tablet 1 during the day and 2 near bedtime 90 tablet 3   Cyanocobalamin (B-12 PO) Take 1 tablet by mouth daily. 500 mg daily     furosemide (LASIX) 20 MG tablet TAKE 2 TABLETS (40 MG TOTAL) BY MOUTH DAILY. 180 tablet 1   glucose blood (ONETOUCH VERIO) test strip USE AS INSTRUCTED TO TEST BLOOD SUGAR DAILY 100 strip 0   Lancets (ONETOUCH DELICA PLUS [id number])  MISC USE TO TEST BLOOD SUGAR DAILY 100 each 0   lisinopril (ZESTRIL) 10 MG tablet TAKE 1 TABLET BY MOUTH EVERY DAY 90 tablet 0   lisinopril (ZESTRIL) 5 MG tablet Take 1 tablet (5 mg total) by mouth daily. 90 tablet 1   metFORMIN (GLUCOPHAGE) 1000 MG tablet Take 1 tablet (1,000 mg total) by mouth 2 (two) times daily. 180 tablet 1   metoprolol succinate (TOPROL-XL) 50 MG 24 hr tablet TAKE 1 TABLET BY MOUTH DAILY WITH FOOD 90 tablet 1   Milk Thistle 1000 MG CAPS Take by mouth.     Omega-3 Fatty Acids (FISH OIL) 1000 MG CAPS Take 1 capsule by mouth in the morning, at noon, and at bedtime.     pantoprazole (PROTONIX) 40 MG tablet TAKE 1 TABLET BY MOUTH EVERY DAY 90 tablet 0   potassium chloride SA (KLOR-CON M20) 20 MEQ tablet TAKE 1 TABLET BY MOUTH EVERY DAY 90 tablet 0   rosuvastatin (CRESTOR) 20 MG tablet Take 1 tablet (20 mg total) by mouth daily. 90 tablet 3   spironolactone (ALDACTONE) 50 MG tablet Take 1 tablet (50 mg total) by mouth daily. 90 tablet 1   venlafaxine XR (EFFEXOR-XR) 150 MG 24 hr capsule TAKE 1 CAPSULE BY MOUTH TWICE A DAY 180 capsule 1   vitamin E 180 MG (400 UNITS) capsule Take by mouth.     zinc gluconate 50 MG tablet Take 50 mg by mouth daily.     No current facility-administered medications for this visit.    Allergies as of 10/14/2021   (No Known Allergies)    Family History  Problem Relation Age of Onset   Colon cancer Mother    Hypertension Mother    CAD Mother    Breast cancer Mother    Cirrhosis Brother     Social History   Socioeconomic History   Marital status: Married    Spouse name: Not on file   Number of children: 4   Years of education: Not on file   Highest education level: Not on file  Occupational History   Occupation: Barrister's clerk   Tobacco Use   Smoking status: Never   Smokeless tobacco: Current    Types: Snuff   Tobacco comments:    Uses dip pouches occasionally  Substance and Sexual Activity   Alcohol use: No    Alcohol/week: 0.0  standard drinks of alcohol    Comment: Previously 1-2 cases of beer/week. patient states never on regular basis though. last etoh in 2013.    Drug use: No   Sexual activity: Not Currently  Other Topics  Concern   Not on file  Social History Narrative   ** Merged History Encounter **       Social Determinants of Health   Financial Resource Strain: Not on file  Food Insecurity: No Food Insecurity (07/21/2021)   Hunger Vital Sign    Worried About Running Out of Food in the Last Year: Never true    Ran Out of Food in the Last Year: Never true  Transportation Needs: No Transportation Needs (07/21/2021)   PRAPARE - Hydrologist (Medical): No    Lack of Transportation (Non-Medical): No  Physical Activity: Not on file  Stress: Not on file  Social Connections: Not on file    Review of Systems: Gen: Denies fever, chills, cold or flu like symptoms, pre-syncope, or syncope.  CV: Denies chest pain, palpitations. Resp: Denies dyspnea, cough.  GI: See HPI Heme: See HPI  Physical Exam: There were no vitals taken for this visit. General:   Alert and oriented. No distress noted. Pleasant and cooperative.  Head:  Normocephalic and atraumatic. Eyes:  Conjuctiva clear without scleral icterus. Heart:  S1, S2 present without murmurs appreciated. Lungs:  Clear to auscultation bilaterally. No wheezes, rales, or rhonchi. No distress.  Abdomen:  +BS, soft, non-tender and non-distended. No rebound or guarding. No HSM or masses noted. Msk:  Symmetrical without gross deformities. Normal posture. Extremities:  Without edema. Neurologic:  Alert and  oriented x4 Psych:  Normal mood and affect.    Assessment:     Plan:  ***   Aliene Altes, PA-C [facility] 10/14/2021

## 2021-10-13 NOTE — Telephone Encounter (Signed)
Noted  

## 2021-10-14 ENCOUNTER — Ambulatory Visit: Payer: PPO | Admitting: Gastroenterology

## 2021-10-14 ENCOUNTER — Other Ambulatory Visit (HOSPITAL_COMMUNITY): Payer: PPO

## 2021-10-14 ENCOUNTER — Encounter: Payer: Self-pay | Admitting: Gastroenterology

## 2021-10-14 VITALS — BP 112/67 | HR 60 | Temp 97.9°F | Ht 66.0 in | Wt 224.6 lb

## 2021-10-14 DIAGNOSIS — R188 Other ascites: Secondary | ICD-10-CM

## 2021-10-14 DIAGNOSIS — K7460 Unspecified cirrhosis of liver: Secondary | ICD-10-CM | POA: Diagnosis not present

## 2021-10-14 NOTE — Patient Instructions (Addendum)
We will arrange to have an ultrasound of your liver to evaluate the blood flow to ensure you do not have a blood clot that may be contributing to fluid buildup in your belly.  We will also arrange for you to have a paracentesis to have the fluid drawn off your belly.  Please have prior blood work completed tomorrow.  Continue taking spironolactone 50 mg daily and Lasix 40 mg daily for now. We may make adjustments after your blood work.   Follow a strict 2000 mg sodium diet.  This means no more than 2000 mg of sodium per 24 hours which includes everything you eat and drink.  You should look at the nutritional labels on the back of everything you are consuming to determine how much sodium you are taking in.  We will plan to see you back in a couple of weeks.   You need an EGD and a colonoscopy in the very near future. This can be discussed at your follow-up visit in a couple of weeks.   Aliene Altes, PA-C [facility]

## 2021-10-15 ENCOUNTER — Ambulatory Visit (HOSPITAL_COMMUNITY)
Admission: RE | Admit: 2021-10-15 | Discharge: 2021-10-15 | Disposition: A | Discharge status: Home / Self Care | Payer: PPO | Source: Ambulatory Visit | Attending: Gastroenterology | Admitting: Gastroenterology

## 2021-10-15 ENCOUNTER — Other Ambulatory Visit: Payer: Self-pay | Admitting: *Deleted

## 2021-10-15 ENCOUNTER — Encounter (HOSPITAL_COMMUNITY): Payer: Self-pay

## 2021-10-15 VITALS — BP 108/78 | HR 60 | Temp 98.1°F | Resp 20

## 2021-10-15 DIAGNOSIS — K7031 Alcoholic cirrhosis of liver with ascites: Secondary | ICD-10-CM | POA: Diagnosis not present

## 2021-10-15 DIAGNOSIS — K7689 Other specified diseases of liver: Secondary | ICD-10-CM | POA: Diagnosis not present

## 2021-10-15 DIAGNOSIS — I868 Varicose veins of other specified sites: Secondary | ICD-10-CM | POA: Diagnosis not present

## 2021-10-15 DIAGNOSIS — K7460 Unspecified cirrhosis of liver: Secondary | ICD-10-CM | POA: Diagnosis not present

## 2021-10-15 DIAGNOSIS — R188 Other ascites: Secondary | ICD-10-CM | POA: Diagnosis not present

## 2021-10-15 DIAGNOSIS — I81 Portal vein thrombosis: Secondary | ICD-10-CM | POA: Diagnosis not present

## 2021-10-15 LAB — GRAM STAIN: Gram Stain: NONE SEEN

## 2021-10-15 LAB — BODY FLUID CELL COUNT WITH DIFFERENTIAL
Eos, Fluid: 0 %
Lymphs, Fluid: 30 %
Monocyte-Macrophage-Serous Fluid: 53 % (ref 50–90)
Neutrophil Count, Fluid: 17 % (ref 0–25)
Total Nucleated Cell Count, Fluid: 544 cu mm (ref 0–1000)

## 2021-10-15 NOTE — Progress Notes (Signed)
Paracentesis complete no signs of distress, 4L ascites removed per maximum amount to be removed per order.

## 2021-10-15 NOTE — Procedures (Signed)
Interventional Radiology Procedure:   Indications: Cirrhosis and ascites  Procedure: US guided paracentesis  Findings: Removed 4000 ml from right lower quadrant  Complications: None     EBL: Less than 10 ml   Cheria Sadiq R. Anselm Pancoast, MD  Pager: [phone]

## 2021-10-17 ENCOUNTER — Other Ambulatory Visit: Payer: Self-pay | Admitting: Family Medicine

## 2021-10-18 DIAGNOSIS — K219 Gastro-esophageal reflux disease without esophagitis: Secondary | ICD-10-CM | POA: Diagnosis not present

## 2021-10-18 DIAGNOSIS — I10 Essential (primary) hypertension: Secondary | ICD-10-CM | POA: Diagnosis not present

## 2021-10-18 DIAGNOSIS — E119 Type 2 diabetes mellitus without complications: Secondary | ICD-10-CM | POA: Diagnosis not present

## 2021-10-18 DIAGNOSIS — E785 Hyperlipidemia, unspecified: Secondary | ICD-10-CM | POA: Diagnosis not present

## 2021-10-18 DIAGNOSIS — Z800 Family history of malignant neoplasm of digestive organs: Secondary | ICD-10-CM | POA: Diagnosis not present

## 2021-10-18 DIAGNOSIS — K7460 Unspecified cirrhosis of liver: Secondary | ICD-10-CM | POA: Diagnosis not present

## 2021-10-18 DIAGNOSIS — E1169 Type 2 diabetes mellitus with other specified complication: Secondary | ICD-10-CM | POA: Diagnosis not present

## 2021-10-19 ENCOUNTER — Other Ambulatory Visit: Payer: Self-pay | Admitting: *Deleted

## 2021-10-19 DIAGNOSIS — K7460 Unspecified cirrhosis of liver: Secondary | ICD-10-CM

## 2021-10-19 DIAGNOSIS — K760 Fatty (change of) liver, not elsewhere classified: Secondary | ICD-10-CM

## 2021-10-19 LAB — HEMOGLOBIN A1C
Est. average glucose Bld gHb Est-mCnc: 117 mg/dL
Hgb A1c MFr Bld: 5.7 % — ABNORMAL HIGH (ref 4.8–5.6)

## 2021-10-19 LAB — COMPREHENSIVE METABOLIC PANEL
ALT: 31 IU/L (ref 0–44)
AST: 35 IU/L (ref 0–40)
Albumin/Globulin Ratio: 0.9 — ABNORMAL LOW (ref 1.2–2.2)
Albumin: 3.4 g/dL — ABNORMAL LOW (ref 3.9–4.9)
Alkaline Phosphatase: 129 IU/L — ABNORMAL HIGH (ref 44–121)
BUN/Creatinine Ratio: 12 (ref 10–24)
BUN: 22 mg/dL (ref 8–27)
Bilirubin Total: 0.7 mg/dL (ref 0.0–1.2)
CO2: 23 mmol/L (ref 20–29)
Calcium: 9.8 mg/dL (ref 8.6–10.2)
Chloride: 103 mmol/L (ref 96–106)
Creatinine, Ser: 1.85 mg/dL — ABNORMAL HIGH (ref 0.76–1.27)
Globulin, Total: 3.6 g/dL (ref 1.5–4.5)
Glucose: 82 mg/dL (ref 70–99)
Potassium: 4.6 mmol/L (ref 3.5–5.2)
Sodium: 139 mmol/L (ref 134–144)
Total Protein: 7 g/dL (ref 6.0–8.5)
eGFR: 40 mL/min/{1.73_m2} — ABNORMAL LOW (ref >59)

## 2021-10-19 LAB — CBC WITH DIFFERENTIAL/PLATELET
Basophils Absolute: 0.1 10*3/uL (ref 0.0–0.2)
Basos: 1 %
EOS (ABSOLUTE): 0.3 10*3/uL (ref 0.0–0.4)
Eos: 5 %
Hematocrit: 42.3 % (ref 37.5–51.0)
Hemoglobin: 14.6 g/dL (ref 13.0–17.7)
Immature Grans (Abs): 0 10*3/uL (ref 0.0–0.1)
Immature Granulocytes: 0 %
Lymphocytes Absolute: 0.9 10*3/uL (ref 0.7–3.1)
Lymphs: 14 %
MCH: 31.9 pg (ref 26.6–33.0)
MCHC: 34.5 g/dL (ref 31.5–35.7)
MCV: 93 fL (ref 79–97)
Monocytes Absolute: 0.8 10*3/uL (ref 0.1–0.9)
Monocytes: 12 %
Neutrophils Absolute: 4.1 10*3/uL (ref 1.4–7.0)
Neutrophils: 68 %
Platelets: 151 10*3/uL (ref 150–450)
RBC: 4.57 x10E6/uL (ref 4.14–5.80)
RDW: 13.1 % (ref 11.6–15.4)
WBC: 6.1 10*3/uL (ref 3.4–10.8)

## 2021-10-19 LAB — HEPATIC FUNCTION PANEL
ALT: 29 IU/L (ref 0–44)
AST: 37 IU/L (ref 0–40)
Albumin: 3.8 g/dL — ABNORMAL LOW (ref 3.9–4.9)
Alkaline Phosphatase: 123 IU/L — ABNORMAL HIGH (ref 44–121)
Bilirubin Total: 0.7 mg/dL (ref 0.0–1.2)
Bilirubin, Direct: 0.22 mg/dL (ref 0.00–0.40)
Total Protein: 7.1 g/dL (ref 6.0–8.5)

## 2021-10-19 LAB — LIPID PANEL
Chol/HDL Ratio: 5.8 ratio — ABNORMAL HIGH (ref 0.0–5.0)
Cholesterol, Total: 231 mg/dL — ABNORMAL HIGH (ref 100–199)
HDL: 40 mg/dL (ref >39)
LDL Chol Calc (NIH): 152 mg/dL — ABNORMAL HIGH (ref 0–99)
Triglycerides: 214 mg/dL — ABNORMAL HIGH (ref 0–149)
VLDL Cholesterol Cal: 39 mg/dL (ref 5–40)

## 2021-10-19 LAB — AFP TUMOR MARKER: AFP, Serum, Tumor Marker: 7.6 ng/mL (ref 0.0–8.4)

## 2021-10-19 LAB — PROTIME-INR
INR: 1.2 (ref 0.9–1.2)
Prothrombin Time: 12.7 s — ABNORMAL HIGH (ref 9.1–12.0)

## 2021-10-20 LAB — CULTURE, BODY FLUID W GRAM STAIN -BOTTLE: Culture: NO GROWTH

## 2021-10-20 LAB — CYTOLOGY - NON PAP

## 2021-10-21 DIAGNOSIS — K760 Fatty (change of) liver, not elsewhere classified: Secondary | ICD-10-CM | POA: Diagnosis not present

## 2021-10-21 DIAGNOSIS — K7460 Unspecified cirrhosis of liver: Secondary | ICD-10-CM | POA: Diagnosis not present

## 2021-10-22 ENCOUNTER — Other Ambulatory Visit: Payer: Self-pay | Admitting: *Deleted

## 2021-10-22 DIAGNOSIS — K7460 Unspecified cirrhosis of liver: Secondary | ICD-10-CM

## 2021-10-22 DIAGNOSIS — K760 Fatty (change of) liver, not elsewhere classified: Secondary | ICD-10-CM

## 2021-10-22 LAB — BASIC METABOLIC PANEL
BUN/Creatinine Ratio: 21 (ref 10–24)
BUN: 33 mg/dL — ABNORMAL HIGH (ref 8–27)
CO2: 20 mmol/L (ref 20–29)
Calcium: 9.3 mg/dL (ref 8.6–10.2)
Chloride: 105 mmol/L (ref 96–106)
Creatinine, Ser: 1.58 mg/dL — ABNORMAL HIGH (ref 0.76–1.27)
Glucose: 108 mg/dL — ABNORMAL HIGH (ref 70–99)
Potassium: 4.3 mmol/L (ref 3.5–5.2)
Sodium: 140 mmol/L (ref 134–144)
eGFR: 49 mL/min/{1.73_m2} — ABNORMAL LOW (ref >59)

## 2021-10-25 ENCOUNTER — Ambulatory Visit: Payer: PPO | Admitting: Gastroenterology

## 2021-10-25 ENCOUNTER — Encounter: Payer: Self-pay | Admitting: Gastroenterology

## 2021-10-25 VITALS — BP 107/74 | HR 61 | Temp 97.8°F | Ht 65.0 in | Wt 221.0 lb

## 2021-10-25 DIAGNOSIS — R188 Other ascites: Secondary | ICD-10-CM | POA: Diagnosis not present

## 2021-10-25 DIAGNOSIS — Z800 Family history of malignant neoplasm of digestive organs: Secondary | ICD-10-CM | POA: Diagnosis not present

## 2021-10-25 DIAGNOSIS — K7460 Unspecified cirrhosis of liver: Secondary | ICD-10-CM

## 2021-10-25 NOTE — Patient Instructions (Addendum)
Please go for labs this week at [facility]. I will be checking your kidneys again and your ammonia level. If kidneys are better, we will restart your fluid pills and draw some more fluid off your abdomen.  Hold off on starting your furosemide and spironolactone until after your blood work comes back. Keep upcoming appointment with Dr. Sallee Lange. We will be working towards an upper endoscopy and colonoscopy once we have your fluid/kidney issues under control.

## 2021-10-25 NOTE — Progress Notes (Signed)
GI Office Note    Referring Provider: [name], MD Primary Care Physician:  [name], MD  Primary Gastroenterologist: Garfield Cornea, MD   Chief Complaint   Chief Complaint  Patient presents with   Follow-up    States that he is doing well    History of Present Illness   Shawn [name]. is a 66 y.o. male presenting today for 2-week follow-up of cirrhosis.  He was seen October 14, 2021 for swelling.  He has a history of GERD, cirrhosis felt to be due to NASH plus or minus alcohol with remote intermittent heavy alcohol use but sober since 2013, history of elevated AFP in 2021 with no HCC on MRI.  AFP ticked upward again last fall and another MRI in November 2022 with no evidence of HCC. In 2021 he had decompensation with ascites and lower extremity edema.  First paracentesis at that time with no evidence of SBP, cytology negative.  Has been maintained on Lasix 40 mg daily and spironolactone 50 mg daily and doing well at time of this visit in April 2023.  He has never completed EGD for esophageal variceal screening as recommended.  No prior colonoscopy.  Family history of colon cancer, mother.  Wanted to postpone at time of last office visit.  Presented August night with complaints of abdominal swelling.  Brought in for an urgent visit.  Completed ultrasound-guided paracentesis, 4 L of fluid removed on August 11.  Liver Doppler same day showed patent portal, hepatic and splenic veins.  Cell count with overall total nucleated cell count of 544, 17% neutrophils with PMNs of 92 not consistent with SBP. Similar to fluid results in 2021. Gram stain negative.  Blood culture remain negative.  Labs from 10/18/2021 with bump in creatinine of 1.85, AFP normal, MELD sodium 15.  Diuretics were held for a couple of days and labs repeated.  Creatinine had improved to 1.58.  Diuretics held further with goals of repeating labs today.  Today: Feels like some of the fluid is back, abdomen tight  but not as tight as before.  Not urinating as much off of his fluid pills.  Denies significant lower extremity edema.  Continues to have issues with the shakes.  Notes that he has had some tremors in the hands, sometimes worse than others.  Bad "shakes" he usually associates with low sugars, gets busy and forgets to eat.  Also associated with sweats.  Recently Invokana was decreased for this purpose.  He notes is hard to take a picture with his cell phone because of the shakes in his hands.  Does not really notice any shaking with things like holding a cup.  Denies episodes of confusion or lethargy.  He has issues sleeping, when he finally falls asleep he does tend to sleep late in the mornings but when he gets up he is up throughout the day.  He does report a few falls which she states is due to missteps.  Denies neuropathy or weakness in the lower extremities.  Noted to get off balance a couple of times in the office when he went to sit in the chair and also getting off the table.  Bowel movements twice daily.  No melena or rectal bleeding.  No upper GI symptoms.    Medications   Current Outpatient Medications  Medication Sig Dispense Refill   amLODipine (NORVASC) 5 MG tablet TAKE 1 TABLET (5 MG TOTAL) BY MOUTH DAILY. 90 tablet 0  ascorbic acid (VITAMIN C) 500 MG tablet Take 500 mg by mouth daily.     blood glucose meter kit and supplies KIT Dispense based on patient and insurance preference. Use daily as directed. (FOR ICD - 10 E11.9) 1 each 5   blood glucose meter kit and supplies Dispense based on patient and insurance preference. Use to check sugars once per day. Dx.E11.9 1 each 0   canagliflozin (INVOKANA) 100 MG TABS tablet Take 1 tablet (100 mg total) by mouth daily before breakfast. 90 tablet 1   Cholecalciferol 50 MCG (2000 UT) TABS Take 1 tablet by mouth daily.     clonazePAM (KLONOPIN) 0.5 MG tablet 1 during the day and 2 near bedtime 90 tablet 3   Cyanocobalamin (B-12 PO) Take 1 tablet  by mouth daily. 500 mg daily     furosemide (LASIX) 20 MG tablet TAKE 2 TABLETS (40 MG TOTAL) BY MOUTH DAILY. 180 tablet 1   glucose blood (ONETOUCH VERIO) test strip USE AS INSTRUCTED TO TEST BLOOD SUGAR DAILY 100 strip 0   Lancets (ONETOUCH DELICA PLUS [id number]) MISC USE TO TEST BLOOD SUGAR DAILY 100 each 0   lisinopril (ZESTRIL) 5 MG tablet Take 1 tablet (5 mg total) by mouth daily. 90 tablet 1   metFORMIN (GLUCOPHAGE) 1000 MG tablet TAKE 1 TABLET BY MOUTH TWICE A DAY 180 tablet 1   metoprolol succinate (TOPROL-XL) 50 MG 24 hr tablet TAKE 1 TABLET BY MOUTH DAILY WITH FOOD 90 tablet 1   Milk Thistle 1000 MG CAPS Take by mouth.     Omega-3 Fatty Acids (FISH OIL) 1000 MG CAPS Take 1 capsule by mouth in the morning, at noon, and at bedtime.     pantoprazole (PROTONIX) 40 MG tablet TAKE 1 TABLET BY MOUTH EVERY DAY 90 tablet 0   potassium chloride SA (KLOR-CON M20) 20 MEQ tablet TAKE 1 TABLET BY MOUTH EVERY DAY 90 tablet 0   rosuvastatin (CRESTOR) 20 MG tablet Take 1 tablet (20 mg total) by mouth daily. 90 tablet 3   spironolactone (ALDACTONE) 50 MG tablet Take 1 tablet (50 mg total) by mouth daily. 90 tablet 1   venlafaxine XR (EFFEXOR-XR) 150 MG 24 hr capsule TAKE 1 CAPSULE BY MOUTH TWICE A DAY 180 capsule 1   vitamin E 180 MG (400 UNITS) capsule Take by mouth.     zinc gluconate 50 MG tablet Take 50 mg by mouth daily.     No current facility-administered medications for this visit.    Allergies   Allergies as of 10/25/2021   (No Known Allergies)     Past Medical History   Past Medical History:  Diagnosis Date   ADD (attention deficit disorder)    ADHD    Anxiety 1987   mild (crowds, noises)   Anxiety    Attention deficit disorder    Cancer ([place])    Cirrhosis of liver (HCC)    CVA (cerebral vascular accident) ([place])    Depression    Diabetic neuropathy ([place])    Essential hypertension, benign    Long-standing history, negative secondary workup   Headache    History of  cardiac catheterization    Widely patent coronary and renal arteries November 2013   Hyperlipidemia    Hypertension    whitecoat   Obstructive sleep apnea    cpap   Prediabetes    Sleep apnea    TIA (transient ischemic attack)    Possible, 2007   Type 2 diabetes mellitus ([place])  Venous stasis     Past Surgical History   Past Surgical History:  Procedure Laterality Date   ANTERIOR CERVICAL DECOMP/DISCECTOMY FUSION N/A 12/30/2015   Procedure: ANTERIOR CERVICAL DECOMPRESSION/DISCECTOMY FUSION C6 - C7 1 LEVEL;  Surgeon: Melina Schools, MD;  Location: [place];  Service: Orthopedics;  Laterality: N/A;   CARDIAC CATHETERIZATION  01/2012   negative   CARPAL TUNNEL RELEASE     CERVICAL SPINE NERVE BLOCK  05/17/2017   KNEE SURGERY Right    NECK SURGERY     VASECTOMY      Past Family History   Family History  Problem Relation Age of Onset   Colon cancer Mother    Hypertension Mother    CAD Mother    Breast cancer Mother    Cirrhosis Brother     Past Social History   Social History   Socioeconomic History   Marital status: Married    Spouse name: Not on file   Number of children: 4   Years of education: Not on file   Highest education level: Not on file  Occupational History   Occupation: Barrister's clerk   Tobacco Use   Smoking status: Never   Smokeless tobacco: Current    Types: Snuff   Tobacco comments:    Uses dip pouches occasionally  Substance and Sexual Activity   Alcohol use: No    Alcohol/week: 0.0 standard drinks of alcohol    Comment: Previously 1-2 cases of beer/week. patient states never on regular basis though. last etoh in 2013.    Drug use: No   Sexual activity: Not Currently  Other Topics Concern   Not on file  Social History Narrative   ** Merged History Encounter **       Social Determinants of Health   Financial Resource Strain: Not on file  Food Insecurity: No Food Insecurity (07/21/2021)   Hunger Vital Sign    Worried About Running Out of  Food in the Last Year: Never true    Ran Out of Food in the Last Year: Never true  Transportation Needs: No Transportation Needs (07/21/2021)   PRAPARE - Hydrologist (Medical): No    Lack of Transportation (Non-Medical): No  Physical Activity: Not on file  Stress: Not on file  Social Connections: Not on file  Intimate Partner Violence: Not on file    Review of Systems   General: Negative for anorexia, unintentional weight loss, fever, chills, fatigue, weakness. ENT: Negative for hoarseness, difficulty swallowing , nasal congestion. CV: Negative for chest pain, angina, palpitations, dyspnea on exertion, peripheral edema.  Respiratory: Negative for dyspnea at rest, dyspnea on exertion, cough, sputum, wheezing.  GI: See history of present illness. GU:  Negative for dysuria, hematuria, urinary incontinence, urinary frequency, nocturnal urination.  Endo: Negative for unusual weight change.     Physical Exam   BP 107/74 (BP Location: Left Arm, Patient Position: Sitting, Cuff Size: Large)   Pulse 61   Temp 97.8 F (36.6 C) (Oral)   Ht 5' 5"  (1.651 m)   Wt 221 lb (100.2 kg)   SpO2 100%   BMI 36.78 kg/m    General: well-nourished, well-developed in no acute distress.  Slight tremor noted in the right hand. Eyes: No icterus. Mouth: Oropharyngeal mucosa moist and pink , no lesions erythema or exudate. Lungs: Clear to auscultation bilaterally.  Heart: Regular rate and rhythm, no murmurs rubs or gallops.  Abdomen: Bowel sounds are normal, nontender, distended moderately tense, no hepatosplenomegaly  or masses noted.  No abdominal bruit, no rebound or guarding.  Rectal: Performed Extremities: Trace to 1+ bilateral lower extremity edema. No clubbing or deformities. Neuro: Alert and oriented x 4.  No asterixis Skin: Warm and dry, no jaundice.   Psych: Alert and cooperative, normal mood and affect.  Labs   Lab Results  Component Value Date   CREATININE 1.58  (H) 10/21/2021   BUN 33 (H) 10/21/2021   NA 140 10/21/2021   K 4.3 10/21/2021   CL 105 10/21/2021   CO2 20 10/21/2021   Lab Results  Component Value Date   ALT 29 10/18/2021   AST 37 10/18/2021   ALKPHOS 123 (H) 10/18/2021   BILITOT 0.7 10/18/2021   Lab Results  Component Value Date   WBC 6.1 10/18/2021   HGB 14.6 10/18/2021   HCT 42.3 10/18/2021   MCV 93 10/18/2021   PLT 151 10/18/2021   Lab Results  Component Value Date   HGBA1C 5.7 (H) 10/18/2021   Lab Results  Component Value Date   INR 1.2 10/18/2021   INR 1.2 12/28/2020   INR 1.2 11/28/2019     Imaging Studies   US Paracentesis  Result Date: 10/15/2021 INDICATION: Cirrhosis of liver with ascites, unspecified hepatic cirrhosis type. EXAM: ULTRASOUND GUIDED  PARACENTESIS MEDICATIONS: None. COMPLICATIONS: None immediate. PROCEDURE: Informed written consent was obtained from the patient after a discussion of the risks, benefits and alternatives to treatment. A timeout was performed prior to the initiation of the procedure. Initial ultrasound scanning demonstrates a large amount of ascites within the right lower abdominal quadrant. The right lower abdomen was prepped and draped in the usual sterile fashion. 1% lidocaine was used for local anesthesia. Following this, a 19 gauge, 7-cm, Yueh catheter was introduced. An ultrasound image was saved for documentation purposes. The paracentesis was performed. The catheter was removed and a dressing was applied. The patient tolerated the procedure well without immediate post procedural complication. FINDINGS: A total of approximately 4 L of cloudy yellow fluid was removed. Samples were sent to the laboratory as requested by the clinical team. Small amount of residual ascites in the right lower quadrant following the paracentesis. IMPRESSION: Successful ultrasound-guided paracentesis yielding 4 liters of peritoneal fluid. Electronically Signed   By: Markus Daft M.D.   On: 10/15/2021 11:39    US LIVER DOPPLER  Result Date: 10/15/2021 CLINICAL DATA:  Cirrhosis, ascites EXAM: DUPLEX ULTRASOUND OF LIVER TECHNIQUE: Color and duplex Doppler ultrasound was performed to evaluate the hepatic in-flow and out-flow vessels. COMPARISON:  01/14/2019 FINDINGS: Liver: Heterogeneous increased echogenicity surface nodularity compatible with cirrhosis No large focal abnormality or biliary obstruction pattern Main Portal Vein size: 1.0 cm Portal Vein Velocities Main Prox:  28 cm/sec Main Mid: 24 cm/sec Main Dist:  19 cm/sec Right: 40 cm/sec Left: 18 cm/sec Hepatic Vein Velocities Right:  84 cm/sec Middle:  63 cm/sec Left:  140 cm/sec IVC: Present and patent with normal respiratory phasicity. Hepatic Artery Velocity:  40 cm/sec Splenic Vein Velocity:  34 cm/sec Spleen: 12 cm x 14 cm x 6 cm with a total volume of 550 cm^3 (411 cm^3 is upper limit normal) Portal Vein Occlusion/Thrombus: No Splenic Vein Occlusion/Thrombus: No Ascites: Present Varices: None Hepatic cirrhosis again noted with perihepatic ascites. Patent portal, hepatic and splenic veins with normal directional flow. Negative for portal vein occlusion or thrombus. Associated splenomegaly. IMPRESSION: Patent portal, hepatic and splenic veins.  Normal directional flow. Hepatic cirrhosis and associated splenomegaly. Upper abdominal ascites Electronically Signed  By: Eugenie Filler M.D.   On: 10/15/2021 10:18    Assessment   Cirrhosis: Recently with decompensation with ascites and lower extremity edema.  4 L of fluid removed at time of recent paracentesis.  Cell count not consistent with SBP.  Diuretics have been on hold due to bump in creatinine, large-volume paracentesis could have played a role in bump as well.  Plan to recheck creatinine prior to resuming diuretics.  Would like to set him up for additional abdominal If his creatinine is better.  Once his fluid status/renal function stabilized, we will work towards scheduling upper endoscopy (screen for  varices) and colonoscopy (colon cancer screening, family history of colon cancer in a first-degree relative).   Tremors: Patient notes chronic episodes of what he describes as shakes associated with sweating and possibly low sugars.  Gets better after he eats.  Invokana dosage decreased recently.  At other times he has less severe shakes but notes that he was unable to take pictures with his cell phone because of hands shaking so bad.  In the office today he tends to have more shaking of the right hand at rest.  Minimal tremors noted in the right hand when evaluating for asterixis.  No evidence of asterixis.  We will check ammonia level to look for hepatic source for his tremors.  If ammonia level unremarkable, would have him address further with PCP.    PLAN   BMET, ammonia level Continue to hold furosemide and spironolactone until lab work completed. Consider LVAP based on upcoming labs, if creatinine improved. Once fluid status/kidney function stabilizes, consider upper endoscopy and colonoscopy. Keep upcoming appointment with PCP.  Ammonia level unremarkable, will ask PCP to address tremors.   Laureen Ochs. Bobby Rumpf, [place], [facility]

## 2021-10-28 DIAGNOSIS — R188 Other ascites: Secondary | ICD-10-CM | POA: Diagnosis not present

## 2021-10-28 DIAGNOSIS — K7460 Unspecified cirrhosis of liver: Secondary | ICD-10-CM | POA: Diagnosis not present

## 2021-10-28 DIAGNOSIS — Z800 Family history of malignant neoplasm of digestive organs: Secondary | ICD-10-CM | POA: Diagnosis not present

## 2021-10-29 LAB — BASIC METABOLIC PANEL
BUN/Creatinine Ratio: 15 (ref 10–24)
BUN: 28 mg/dL — ABNORMAL HIGH (ref 8–27)
CO2: 19 mmol/L — ABNORMAL LOW (ref 20–29)
Calcium: 9.4 mg/dL (ref 8.6–10.2)
Chloride: 106 mmol/L (ref 96–106)
Creatinine, Ser: 1.82 mg/dL — ABNORMAL HIGH (ref 0.76–1.27)
Glucose: 114 mg/dL — ABNORMAL HIGH (ref 70–99)
Potassium: 4.9 mmol/L (ref 3.5–5.2)
Sodium: 140 mmol/L (ref 134–144)
eGFR: 41 mL/min/{1.73_m2} — ABNORMAL LOW (ref >59)

## 2021-10-29 LAB — AMMONIA: Ammonia: 165 ug/dL (ref 40–200)

## 2021-11-01 ENCOUNTER — Other Ambulatory Visit: Payer: Self-pay

## 2021-11-01 ENCOUNTER — Ambulatory Visit (INDEPENDENT_AMBULATORY_CARE_PROVIDER_SITE_OTHER): Payer: PPO | Admitting: Family Medicine

## 2021-11-01 ENCOUNTER — Encounter: Payer: Self-pay | Admitting: Family Medicine

## 2021-11-01 ENCOUNTER — Other Ambulatory Visit: Payer: Self-pay | Admitting: *Deleted

## 2021-11-01 ENCOUNTER — Telehealth: Payer: Self-pay | Admitting: Gastroenterology

## 2021-11-01 VITALS — BP 102/65 | Wt 226.2 lb

## 2021-11-01 DIAGNOSIS — I10 Essential (primary) hypertension: Secondary | ICD-10-CM | POA: Diagnosis not present

## 2021-11-01 DIAGNOSIS — K7460 Unspecified cirrhosis of liver: Secondary | ICD-10-CM | POA: Diagnosis not present

## 2021-11-01 DIAGNOSIS — R251 Tremor, unspecified: Secondary | ICD-10-CM

## 2021-11-01 DIAGNOSIS — I951 Orthostatic hypotension: Secondary | ICD-10-CM

## 2021-11-01 DIAGNOSIS — E1169 Type 2 diabetes mellitus with other specified complication: Secondary | ICD-10-CM | POA: Diagnosis not present

## 2021-11-01 DIAGNOSIS — F324 Major depressive disorder, single episode, in partial remission: Secondary | ICD-10-CM | POA: Insufficient documentation

## 2021-11-01 DIAGNOSIS — R188 Other ascites: Secondary | ICD-10-CM | POA: Diagnosis not present

## 2021-11-01 DIAGNOSIS — E785 Hyperlipidemia, unspecified: Secondary | ICD-10-CM | POA: Diagnosis not present

## 2021-11-01 DIAGNOSIS — E119 Type 2 diabetes mellitus without complications: Secondary | ICD-10-CM | POA: Diagnosis not present

## 2021-11-01 DIAGNOSIS — R899 Unspecified abnormal finding in specimens from other organs, systems and tissues: Secondary | ICD-10-CM

## 2021-11-01 MED ORDER — SPIRONOLACTONE 50 MG PO TABS: 50.0000 mg | ORAL_TABLET | Freq: Every day | ORAL | 1 refills | Status: DC

## 2021-11-01 MED ORDER — LISINOPRIL 5 MG PO TABS: 5.0000 mg | ORAL_TABLET | Freq: Every day | ORAL | 1 refills | Status: DC

## 2021-11-01 MED ORDER — PANTOPRAZOLE SODIUM 40 MG PO TBEC: 40.0000 mg | DELAYED_RELEASE_TABLET | Freq: Every day | ORAL | 1 refills | Status: DC

## 2021-11-01 MED ORDER — METFORMIN HCL 500 MG PO TABS: ORAL_TABLET | ORAL | 1 refills | Status: DC

## 2021-11-01 MED ORDER — CLONAZEPAM 0.5 MG PO TABS: ORAL_TABLET | ORAL | 3 refills | Status: DC

## 2021-11-01 MED ORDER — METOPROLOL SUCCINATE ER 25 MG PO TB24: 25.0000 mg | ORAL_TABLET | Freq: Every day | ORAL | 1 refills | Status: DC

## 2021-11-01 MED ORDER — VENLAFAXINE HCL ER 150 MG PO CP24: 150.0000 mg | ORAL_CAPSULE | Freq: Two times a day (BID) | ORAL | 1 refills | Status: DC

## 2021-11-01 NOTE — Telephone Encounter (Signed)
See result note.  

## 2021-11-01 NOTE — Progress Notes (Addendum)
   Subjective:    Patient ID: Shawn Greaves., male    DOB: 1955/12/01, 66 y.o.   MRN: [id number]  Hypertension This is a chronic problem. (Balance issues at times) Risk factors for coronary artery disease include diabetes mellitus. There are no compliance problems.   Diabetes He presents for his follow-up diabetic visit. He has type 2 diabetes mellitus. (Balance issues) Risk factors for coronary artery disease include hypertension. Home blood sugar record trend: checking sugars qod. He does not see a podiatrist.Eye exam is current.   Pt having fluid on abdomen. Pt states about 3 weeks ago he had 4L drawn off abdomen.   Pt would like to speak with PCP about something to help him sleep.  Patient states often it several hours before he falls asleep has a hard time staying asleep  He states his moods overall are doing good he denies being depressed states medicine helping him  Gastroenterology working with him regarding the ascites Patient on multiple medicines He is related that recently he gets lightheaded with standing up This raises a possibility of hypotension   Review of Systems     Objective:   Physical Exam General-in no acute distress Eyes-no discharge Lungs-respiratory rate normal, CTA CV-no murmurs,RRR Extremities skin warm dry mild edema in the legs  Neuro grossly normal Behavior normal, alert Moderate ascites noted       Assessment & Plan:  1. Essential hypertension, benign Reduce blood pressure medicine.  Stop amlodipine.  Continue lisinopril for now recheck met 7 in 2 weeks - Basic metabolic panel - Microalbumin/Creatinine Ratio, Urine  2. Hyperlipidemia associated with type 2 diabetes mellitus (HCC) Healthy diet not a statin candidate - Basic metabolic panel - Microalbumin/Creatinine Ratio, Urine  3. Tremor May use clonazepam 3 times daily to help with tremor it will not completely remove it but will help  4. Cirrhosis of liver with ascites,  unspecified hepatic cirrhosis type ([place]) Has had ascites fluid drawn off will probably have to have more drawn off recommend following through gastroenterology  5. Type 2 diabetes mellitus without complication, without long-term current use of insulin (HCC) A1c looks good reduce metformin down to 500 mg once a day - Basic metabolic panel - Microalbumin/Creatinine Ratio, Urine  Insomnia-we will research potential options for this patient  Orthostatic hypotension stop amlodipine will monitor blood pressure on follow-up possibly reduce lisinopril further on follow-up depending on how blood pressures go  Morbid obesity part of this is related to his ascites healthy diet regular activity recommended  The patient requested medication to help with sleep I reviewed over different options there is nothing straightforward that we can utilize that would help him with sleep that what can also cause a potential setback so for right now we will hold off

## 2021-11-01 NOTE — Telephone Encounter (Signed)
Pt returning call regarding results. [phone]

## 2021-11-02 ENCOUNTER — Telehealth: Payer: Self-pay | Admitting: Gastroenterology

## 2021-11-02 DIAGNOSIS — R188 Other ascites: Secondary | ICD-10-CM | POA: Diagnosis not present

## 2021-11-02 DIAGNOSIS — K7460 Unspecified cirrhosis of liver: Secondary | ICD-10-CM | POA: Diagnosis not present

## 2021-11-03 LAB — COMPREHENSIVE METABOLIC PANEL
ALT: 27 IU/L (ref 0–44)
AST: 34 IU/L (ref 0–40)
Albumin/Globulin Ratio: 1.2 (ref 1.2–2.2)
Albumin: 3.5 g/dL — ABNORMAL LOW (ref 3.9–4.9)
Alkaline Phosphatase: 114 IU/L (ref 44–121)
BUN/Creatinine Ratio: 13 (ref 10–24)
BUN: 24 mg/dL (ref 8–27)
Bilirubin Total: 0.5 mg/dL (ref 0.0–1.2)
CO2: 19 mmol/L — ABNORMAL LOW (ref 20–29)
Calcium: 9.3 mg/dL (ref 8.6–10.2)
Chloride: 105 mmol/L (ref 96–106)
Creatinine, Ser: 1.78 mg/dL — ABNORMAL HIGH (ref 0.76–1.27)
Globulin, Total: 3 g/dL (ref 1.5–4.5)
Glucose: 84 mg/dL (ref 70–99)
Potassium: 4.8 mmol/L (ref 3.5–5.2)
Sodium: 138 mmol/L (ref 134–144)
Total Protein: 6.5 g/dL (ref 6.0–8.5)
eGFR: 42 mL/min/{1.73_m2} — ABNORMAL LOW (ref >59)

## 2021-11-03 LAB — PROTIME-INR
INR: 1.2 (ref 0.9–1.2)
Prothrombin Time: 12.4 s — ABNORMAL HIGH (ref 9.1–12.0)

## 2021-11-04 ENCOUNTER — Telehealth: Payer: Self-pay | Admitting: *Deleted

## 2021-11-04 ENCOUNTER — Other Ambulatory Visit: Payer: Self-pay | Admitting: *Deleted

## 2021-11-04 DIAGNOSIS — R188 Other ascites: Secondary | ICD-10-CM

## 2021-11-04 NOTE — Telephone Encounter (Signed)
Spoke with Nira Conn from [place] about pt's referral to see if he can get in before October.  She says that the physicians look at the referral and rate them. She says the pt is rated at a 3 and that they can't get him any sooner. She says he is more then welcome to call and see if there have been any cancellations.   Pt informed of above message and he says he has an appointment scheduled for 12/12/21 with them. He wants to know if there is any way to get some of the fluid off of his stomach because it's bothering him. Please advise. Thank you.

## 2021-11-04 NOTE — Telephone Encounter (Signed)
Patient informed that US paracentesis has been set up. Appt is 11/05/21 at 9:00 am, he needs to arrive at 8:45 am to check in and he will need to go 2-3 days after the paracentesis to have lab work done. Pt verbalized understanding.

## 2021-11-04 NOTE — Telephone Encounter (Signed)
Set him up US paracentesis. No more than 4 liters to be removed.  He needs IV albumin 25% 25 grams.  Send fluid for cell count with diff, gram stain, and aerobic/anaerobic cultures His labs from 2 days ago were slightly improved with regards to kidney function.  Recheck Met-7 two to three days after his paracentesis.

## 2021-11-05 ENCOUNTER — Ambulatory Visit (HOSPITAL_COMMUNITY)
Admission: RE | Admit: 2021-11-05 | Discharge: 2021-11-05 | Disposition: A | Discharge status: Home / Self Care | Payer: PPO | Source: Ambulatory Visit | Attending: Gastroenterology | Admitting: Gastroenterology

## 2021-11-05 ENCOUNTER — Encounter (HOSPITAL_COMMUNITY): Payer: Self-pay

## 2021-11-05 DIAGNOSIS — K7460 Unspecified cirrhosis of liver: Secondary | ICD-10-CM

## 2021-11-05 DIAGNOSIS — R188 Other ascites: Secondary | ICD-10-CM | POA: Insufficient documentation

## 2021-11-05 LAB — BODY FLUID CELL COUNT WITH DIFFERENTIAL
Eos, Fluid: 0 %
Lymphs, Fluid: 29 %
Monocyte-Macrophage-Serous Fluid: 54 % (ref 50–90)
Neutrophil Count, Fluid: 17 % (ref 0–25)
Total Nucleated Cell Count, Fluid: 426 cu mm (ref 0–1000)

## 2021-11-05 LAB — GRAM STAIN

## 2021-11-05 NOTE — Progress Notes (Signed)
Paracentesis complete no signs of distress.  

## 2021-11-05 NOTE — Procedures (Signed)
PreOperative Dx: Cirrhosis, ascites Postoperative Dx: Cirrhosis, ascites Procedure:   US guided paracentesis Radiologist:  Theodor Mustin Anesthesia:  10 ml of1% lidocaine Specimen:  4 L of cloudy yellow ascitic fluid EBL:   < 1 ml Complications:  None  

## 2021-11-09 LAB — PATHOLOGIST SMEAR REVIEW

## 2021-11-10 LAB — CULTURE, BODY FLUID W GRAM STAIN -BOTTLE: Culture: NO GROWTH

## 2021-11-15 ENCOUNTER — Telehealth: Payer: Self-pay

## 2021-11-15 DIAGNOSIS — K7460 Unspecified cirrhosis of liver: Secondary | ICD-10-CM

## 2021-11-15 NOTE — Telephone Encounter (Signed)
Pt called stating that his stomach is back swollen again and that it is very uncomfortable. Please advise.

## 2021-11-15 NOTE — Telephone Encounter (Signed)
Ok to set up U/S guided paracentesis.  Limit to 4 liters removed. Given IV albumin 25%, 25 grams.  Send fluid for cell count/diff, gram stain, anaerobic/aerobic cultures.   He needs bmet done.

## 2021-11-16 ENCOUNTER — Other Ambulatory Visit: Payer: Self-pay | Admitting: *Deleted

## 2021-11-16 DIAGNOSIS — R188 Other ascites: Secondary | ICD-10-CM

## 2021-11-16 NOTE — Addendum Note (Signed)
Addended by: Cheron Every on: 11/16/2021 08:33 AM   Modules accepted: Orders

## 2021-11-16 NOTE — Telephone Encounter (Signed)
Received call from radiology pt appt needs to be at 9 instead of 8. He voiced understanding.

## 2021-11-16 NOTE — Telephone Encounter (Signed)
Called pt. Aware of PARA appt for 9/15 at 7:45am arrival. Also aware needs lab work done too. Order placed

## 2021-11-19 ENCOUNTER — Ambulatory Visit (HOSPITAL_COMMUNITY)
Admission: RE | Admit: 2021-11-19 | Discharge: 2021-11-19 | Disposition: A | Discharge status: Home / Self Care | Payer: PPO | Source: Ambulatory Visit | Attending: Gastroenterology | Admitting: Gastroenterology

## 2021-11-19 ENCOUNTER — Encounter (HOSPITAL_COMMUNITY): Payer: Self-pay

## 2021-11-19 ENCOUNTER — Other Ambulatory Visit: Payer: Self-pay

## 2021-11-19 ENCOUNTER — Telehealth: Payer: Self-pay | Admitting: Gastroenterology

## 2021-11-19 DIAGNOSIS — N179 Acute kidney failure, unspecified: Secondary | ICD-10-CM | POA: Diagnosis not present

## 2021-11-19 DIAGNOSIS — K7460 Unspecified cirrhosis of liver: Secondary | ICD-10-CM | POA: Diagnosis not present

## 2021-11-19 DIAGNOSIS — R188 Other ascites: Secondary | ICD-10-CM | POA: Diagnosis not present

## 2021-11-19 LAB — BODY FLUID CELL COUNT WITH DIFFERENTIAL
Eos, Fluid: 0 %
Lymphs, Fluid: 57 %
Monocyte-Macrophage-Serous Fluid: 18 % — ABNORMAL LOW (ref 50–90)
Neutrophil Count, Fluid: 25 % (ref 0–25)
Total Nucleated Cell Count, Fluid: 204 cu mm (ref 0–1000)

## 2021-11-19 LAB — GRAM STAIN

## 2021-11-19 MED ORDER — ALBUMIN HUMAN 25 % IV SOLN
INTRAVENOUS | Status: AC
  Administered 2021-11-19: 25 g via INTRAVENOUS
  Filled 2021-11-19: qty 100

## 2021-11-19 MED ORDER — ALBUMIN HUMAN 25 % IV SOLN
0.0000 g | Freq: Once | INTRAVENOUS | Status: AC
  Filled 2021-11-19: qty 400

## 2021-11-19 NOTE — Telephone Encounter (Signed)
Wonder why [facility] lab did not call us?  Tammy and Tammy,   [place] order as a future lab on 11/04/21, this may be why they can't see it. Does it need to be released for them to see. We I placed labs while patient in the office etc, they are not "future" labs. I'm not sure if that's the problem. Please investigate.   Thanks.

## 2021-11-19 NOTE — Procedures (Signed)
PreOperative Dx: Cirrhosis, ascites Postoperative Dx: Cirrhosis, ascites Procedure:   US guided paracentesis Radiologist:  Thornton Papas Anesthesia:  10 ml of1% lidocaine Specimen:  4.18 L of cloudy yellow ascitic fluid EBL:   < 1 ml Complications: None

## 2021-11-19 NOTE — Progress Notes (Signed)
PT tolerated right sided paracentesis procedure and 25G of IV albumin well today. 4 Liters of cloudy yellow ascites removed and labs sent to lab for processing. PT verbalized understanding of discharge instructions and ambulatory at departure with no acute distress noted.

## 2021-11-19 NOTE — Telephone Encounter (Signed)
Also, FYI Tammy C. put in order today after patient called and I already received electronic notification that the BMET was cancelled.   Do NOT use [facility] as the selected lab because if patient does not go that day, the orders gets cancel (due to no specimen received) and I also have noticed like today, system auto deleted order one minute after it was placed. I always put [facility] as lab if patient plans to go to [facility]. This might not be what they prefer but it doesn't get deleted when I do it this way.   I have placed new BMET order.

## 2021-11-19 NOTE — Addendum Note (Signed)
Addended by: Mahala Menghini on: 11/19/2021 12:43 PM   Modules accepted: Orders

## 2021-11-19 NOTE — Telephone Encounter (Signed)
Shawn [name], I received phone call from radiology stating patient had 4liters removed via paracentesis today but still had at least 4 liters left and was miserable. He also received iv albumin. Dr. Thornton Papas, radiologist, inquiring about weekly taps.   Patient just recently started to need taps, had one done 8/11, 9/1, and today. He also has recent decline in renal function and has appointment with nephrology pending.   I have requested multiple times for patient to repeat his BMET. I really need him to do this if he has not already went to the lab today.   I will work on setting him up for standing order taps but cannot determine amount to draw off until I have an updated BMET.

## 2021-11-19 NOTE — Telephone Encounter (Signed)
Pt was made aware, pt states that the lab was unable to see the lab in the system and that he left a message letting us know. I informed the patient that the vm's aren't checked until later on in the day to make sure he talks to someone on the phone if they can't see it on Monday when he goes to have it completed. Pt verbalized understanding.

## 2021-11-20 LAB — BASIC METABOLIC PANEL
BUN/Creatinine Ratio: 17 (ref 10–24)
BUN: 35 mg/dL — ABNORMAL HIGH (ref 8–27)
CO2: 19 mmol/L — ABNORMAL LOW (ref 20–29)
Calcium: 9.2 mg/dL (ref 8.6–10.2)
Chloride: 108 mmol/L — ABNORMAL HIGH (ref 96–106)
Creatinine, Ser: 2.01 mg/dL — ABNORMAL HIGH (ref 0.76–1.27)
Glucose: 77 mg/dL (ref 70–99)
Potassium: 4.2 mmol/L (ref 3.5–5.2)
Sodium: 141 mmol/L (ref 134–144)
eGFR: 36 mL/min/{1.73_m2} — ABNORMAL LOW (ref >59)

## 2021-11-22 ENCOUNTER — Encounter: Payer: Self-pay | Admitting: *Deleted

## 2021-11-23 LAB — PATHOLOGIST SMEAR REVIEW

## 2021-11-23 NOTE — Telephone Encounter (Signed)
For now hold spironolactone, furosemide. I am going to reach out to Dr. Wolfgang Phoenix to see if we can hold metoprolol given decline in renal function and the need to optimize renal perfusion. I worry that repeat paracentesis may be playing a role in his decline in renal function.   I am also reaching out to Dr. Gala Romney for further guidance regarding ongoing limited paracentesis.

## 2021-11-23 NOTE — Telephone Encounter (Signed)
Pt called wanting to know if you are still wanting him to not take the fluid pills and if he is still to have the fluid drawn off again this week. Please advise.

## 2021-11-24 ENCOUNTER — Other Ambulatory Visit: Payer: Self-pay

## 2021-11-24 ENCOUNTER — Other Ambulatory Visit: Payer: Self-pay | Admitting: *Deleted

## 2021-11-24 DIAGNOSIS — R188 Other ascites: Secondary | ICD-10-CM

## 2021-11-24 DIAGNOSIS — E1169 Type 2 diabetes mellitus with other specified complication: Secondary | ICD-10-CM | POA: Diagnosis not present

## 2021-11-24 DIAGNOSIS — N179 Acute kidney failure, unspecified: Secondary | ICD-10-CM

## 2021-11-24 DIAGNOSIS — E785 Hyperlipidemia, unspecified: Secondary | ICD-10-CM | POA: Diagnosis not present

## 2021-11-24 DIAGNOSIS — E119 Type 2 diabetes mellitus without complications: Secondary | ICD-10-CM | POA: Diagnosis not present

## 2021-11-24 DIAGNOSIS — I10 Essential (primary) hypertension: Secondary | ICD-10-CM | POA: Diagnosis not present

## 2021-11-24 LAB — AEROBIC/ANAEROBIC CULTURE W GRAM STAIN (SURGICAL/DEEP WOUND)
Culture: NO GROWTH
Gram Stain: NONE SEEN

## 2021-11-24 LAB — CULTURE, BODY FLUID W GRAM STAIN -BOTTLE: Culture: NO GROWTH

## 2021-11-24 NOTE — Telephone Encounter (Signed)
Pt was made aware and verbalized understanding.  

## 2021-11-24 NOTE — Telephone Encounter (Signed)
Number is not working, sent patient a Pharmacist, community message requesting a return call.

## 2021-11-24 NOTE — Telephone Encounter (Signed)
Pt was made aware and verbalized understanding. Sent pt a copy of 2 gram sodium diet via myChart. Labs have been ordered for pt to have completed the day of tap.

## 2021-11-24 NOTE — Telephone Encounter (Signed)
Spoke with Dr. Gala Romney, he is ok with pursuing another LVAP this week. Arrange for 4 liters ONLY to be removed. He needs to get IV albumin 25% 25 grams with his tap. Send fluid for cell count with diff, anaerobic/aerobic cultures.   We need to reiterate need for two gram sodium restricted diet. Please send him diet sheet.   Please find out where his nephrology appt will be so I can reach out to provider to see about moving up his appt.   Continue to hold on diuretics.  Ov here in four weeks with Dr. Gala Romney.   Update labs when he goes for tap: PT/INR, CMET.

## 2021-11-24 NOTE — Telephone Encounter (Signed)
Pt informed of LVAP scheduled for tomorrow at 11:00 am, to arrive by 10:30 am to check in.  Pt has a scheduled appointment with nephrology on 12/08/21, Dr. Theador Hawthorne.

## 2021-11-25 ENCOUNTER — Ambulatory Visit (HOSPITAL_COMMUNITY)
Admission: RE | Admit: 2021-11-25 | Discharge: 2021-11-25 | Disposition: A | Discharge status: Home / Self Care | Payer: PPO | Source: Ambulatory Visit | Attending: Gastroenterology | Admitting: Gastroenterology

## 2021-11-25 ENCOUNTER — Encounter (HOSPITAL_COMMUNITY): Payer: Self-pay

## 2021-11-25 ENCOUNTER — Other Ambulatory Visit: Payer: Self-pay | Admitting: *Deleted

## 2021-11-25 DIAGNOSIS — K7460 Unspecified cirrhosis of liver: Secondary | ICD-10-CM | POA: Insufficient documentation

## 2021-11-25 DIAGNOSIS — N179 Acute kidney failure, unspecified: Secondary | ICD-10-CM | POA: Diagnosis not present

## 2021-11-25 DIAGNOSIS — R188 Other ascites: Secondary | ICD-10-CM | POA: Diagnosis not present

## 2021-11-25 LAB — BODY FLUID CELL COUNT WITH DIFFERENTIAL
Eos, Fluid: 0 %
Lymphs, Fluid: 53 %
Monocyte-Macrophage-Serous Fluid: 20 % — ABNORMAL LOW (ref 50–90)
Neutrophil Count, Fluid: 27 % — ABNORMAL HIGH (ref 0–25)
Total Nucleated Cell Count, Fluid: 214 cu mm (ref 0–1000)

## 2021-11-25 LAB — GRAM STAIN

## 2021-11-25 MED ORDER — ALBUMIN HUMAN 25 % IV SOLN
INTRAVENOUS | Status: AC
  Administered 2021-11-25: 25 g via INTRAVENOUS
  Filled 2021-11-25: qty 100

## 2021-11-25 MED ORDER — SODIUM CHLORIDE FLUSH 0.9 % IV SOLN
INTRAVENOUS | Status: AC
  Administered 2021-11-25: 10 mL via INTRAVENOUS
  Filled 2021-11-25: qty 10

## 2021-11-25 MED ORDER — ALBUMIN HUMAN 25 % IV SOLN
0.0000 g | Freq: Once | INTRAVENOUS | Status: AC
  Filled 2021-11-25: qty 400

## 2021-11-25 NOTE — Progress Notes (Signed)
PT tolerated left sided paracentesis procedure and 25G of IV albumin well today and 4 Liters of cloudy yellow ascites removed with labs from ascites collected and sent for processing. PT verbalized understanding of discharge instructions and left ambulatory with no acute distress noted.

## 2021-11-25 NOTE — Procedures (Addendum)
  US guided LLQ paracentesis 4 Liters (max per MD) obtained Cloudy yellow color  Sent for labs per MD  Tolerated well  EBL: less than 1 cc

## 2021-11-25 NOTE — Progress Notes (Signed)
   Portal Hypertension Clinic Screening Evaluation   Indication for evaluation: Shawn [name]. is a 66 y.o. male undergoing preliminary evaluation in the [facility] Interventional Radiology Portal Hypertension Clinic due to recurrent ascites.  Referring Physician/Established Gastroenterologist:  Neil Crouch, PA-C, Garfield Cornea, MD  Etiology of cirrhosis: NASH Initially diagnosed: 2021 # of paracentesis in last month: 3 # of paracentesis in last 2 months: 4 History of hepatic hydrothorax:  no History of hepatic encephalopathy: no  Prior evaluation for liver transplant: no History of hepatocellular carcinoma: no  Prior esophagogastroduodenoscopy/intervention: no Current esophageal varices: no Current gastric varices: no History of hematemesis: no  Current diuretic regimen: not taking due to impaired renal function Current pharmacologic encephalopathy prophylaxis/treatment: none  History of renal dysfunction: yes History of hemodialysis: no  History of cardiac dysfunction: no  Other pertinent past medical history: ADHD, anxiety, depression, history of stroke, hypertension, hyperlipidemia, obstructive sleep apnea, type 2 diabetes    Imaging: Prior cross sectional imaging of portal system: MRI abdomen 01/13/21  Patent portal system.  No evidence of varices.  Echocardiogram: None recent.   Labs: 11/02/21-11/24/21 Creatinine: 2.02 Total Bilirubin: 0.5 INR: 1.2 Sodium: 140 Albumin: 3.5  Child-Pugh = 8 points, class B MELD = 15 (6.0% estimated 12-monthmortality) [place] Index of Post-TIPS Survival (FIPS) = 0.74 (estimated overall survival at 1 month 90.8%, 3 months 71.9%, and 6 months 60.7%)    Assessment: Shawn [name] is a 66y.o. male with history of NASH cirrhosis (Child Pugh B, MELD 15) with recurrent ascites, recently occurring over past 2 months, with concomitant renal insufficiency, presumed hepatorenal syndrome.  After preliminary evaluation,  this patient would be an excellent candidate, particularly if unable to resume diuretics due to worsening renal function.  Recommendation: Formal consult for TIPS creation could be considered.  The patient's Gastroenterologist, Dr. RMacky Lower PA-C, will be contacted for further discussion.    Electronically Signed: DSuzette Battiest MD 11/25/2021, 12:33 PM

## 2021-11-26 ENCOUNTER — Other Ambulatory Visit: Payer: Self-pay | Admitting: Family Medicine

## 2021-11-26 LAB — COMPREHENSIVE METABOLIC PANEL
ALT: 25 IU/L (ref 0–44)
AST: 36 IU/L (ref 0–40)
Albumin/Globulin Ratio: 1.4 (ref 1.2–2.2)
Albumin: 3.7 g/dL — ABNORMAL LOW (ref 3.9–4.9)
Alkaline Phosphatase: 118 IU/L (ref 44–121)
BUN/Creatinine Ratio: 20 (ref 10–24)
BUN: 40 mg/dL — ABNORMAL HIGH (ref 8–27)
Bilirubin Total: 0.6 mg/dL (ref 0.0–1.2)
CO2: 20 mmol/L (ref 20–29)
Calcium: 9.1 mg/dL (ref 8.6–10.2)
Chloride: 107 mmol/L — ABNORMAL HIGH (ref 96–106)
Creatinine, Ser: 2.04 mg/dL — ABNORMAL HIGH (ref 0.76–1.27)
Globulin, Total: 2.7 g/dL (ref 1.5–4.5)
Glucose: 85 mg/dL (ref 70–99)
Potassium: 4.3 mmol/L (ref 3.5–5.2)
Sodium: 140 mmol/L (ref 134–144)
Total Protein: 6.4 g/dL (ref 6.0–8.5)
eGFR: 36 mL/min/{1.73_m2} — ABNORMAL LOW (ref >59)

## 2021-11-26 LAB — PROTIME-INR
INR: 1.2 (ref 0.9–1.2)
Prothrombin Time: 12.8 s — ABNORMAL HIGH (ref 9.1–12.0)

## 2021-11-28 LAB — BASIC METABOLIC PANEL
BUN/Creatinine Ratio: 18 (ref 10–24)
BUN: 36 mg/dL — ABNORMAL HIGH (ref 8–27)
CO2: 18 mmol/L — ABNORMAL LOW (ref 20–29)
Calcium: 9.1 mg/dL (ref 8.6–10.2)
Chloride: 107 mmol/L — ABNORMAL HIGH (ref 96–106)
Creatinine, Ser: 2.02 mg/dL — ABNORMAL HIGH (ref 0.76–1.27)
Glucose: 150 mg/dL — ABNORMAL HIGH (ref 70–99)
Potassium: 4.3 mmol/L (ref 3.5–5.2)
Sodium: 140 mmol/L (ref 134–144)
eGFR: 36 mL/min/{1.73_m2} — ABNORMAL LOW (ref >59)

## 2021-11-28 LAB — MICROALBUMIN / CREATININE URINE RATIO

## 2021-11-28 LAB — SPECIMEN STATUS REPORT

## 2021-11-29 ENCOUNTER — Ambulatory Visit (INDEPENDENT_AMBULATORY_CARE_PROVIDER_SITE_OTHER): Payer: PPO | Admitting: Family Medicine

## 2021-11-29 ENCOUNTER — Telehealth: Payer: Self-pay | Admitting: Gastroenterology

## 2021-11-29 ENCOUNTER — Encounter: Payer: Self-pay | Admitting: Family Medicine

## 2021-11-29 ENCOUNTER — Telehealth: Payer: Self-pay | Admitting: Family Medicine

## 2021-11-29 VITALS — BP 111/77 | HR 73 | Wt 223.6 lb

## 2021-11-29 DIAGNOSIS — Z23 Encounter for immunization: Secondary | ICD-10-CM | POA: Diagnosis not present

## 2021-11-29 DIAGNOSIS — E785 Hyperlipidemia, unspecified: Secondary | ICD-10-CM

## 2021-11-29 DIAGNOSIS — E1169 Type 2 diabetes mellitus with other specified complication: Secondary | ICD-10-CM | POA: Diagnosis not present

## 2021-11-29 DIAGNOSIS — K7460 Unspecified cirrhosis of liver: Secondary | ICD-10-CM | POA: Diagnosis not present

## 2021-11-29 DIAGNOSIS — E1122 Type 2 diabetes mellitus with diabetic chronic kidney disease: Secondary | ICD-10-CM | POA: Diagnosis not present

## 2021-11-29 DIAGNOSIS — R188 Other ascites: Secondary | ICD-10-CM | POA: Diagnosis not present

## 2021-11-29 DIAGNOSIS — N1832 Chronic kidney disease, stage 3b: Secondary | ICD-10-CM

## 2021-11-29 DIAGNOSIS — I10 Essential (primary) hypertension: Secondary | ICD-10-CM

## 2021-11-29 MED ORDER — AMLODIPINE BESYLATE 2.5 MG PO TABS: 2.5000 mg | ORAL_TABLET | Freq: Every day | ORAL | 3 refills | Status: DC

## 2021-11-29 MED ORDER — FLUTICASONE PROPIONATE 50 MCG/ACT NA SUSP: 2.0000 | Freq: Every day | NASAL | 6 refills | Status: DC

## 2021-11-29 NOTE — Telephone Encounter (Signed)
Communications noted regarding possibility of TIPs placement, see below. I have updated PCP regarding issue given he has appt with patient this morning.   I will reach out to patient today as well to see if he is willing to move forward with IR consultation.   FYI Mindy.

## 2021-11-29 NOTE — Telephone Encounter (Signed)
Referral already placed per Dr. Gala Romney to go ahead for tips eval.

## 2021-11-29 NOTE — Telephone Encounter (Signed)
-----   Message from Daneil Dolin, MD sent at 11/25/2021  1:31 PM EDT ----- Regarding: RE: possible TIPS   Appreciate the feedback.  Was waiting for his nephrology consultation.  Had a lengthy discussion with my PA Neil Crouch, about this gentleman just yesterday.  TIPS came up in the conversation. In parallel to his nephrology referral, we will go ahead make a referral to IR for  Consideration of TIPS placement ----- Message ----- From: Suzette Battiest, MD Sent: 11/25/2021   1:27 PM EDT To: Monia Sabal, PA-C; Theresa Duty, NP; # Subject: possible TIPS                                  Dr. Gala Romney and Ms. Bobby Rumpf,  Mr. Goettl presented to our department for another paracentesis today.  I have reviewed his chart and imaging, and think he may be a good candidate for TIPS creation if diuretics remain contraindicated due to his worsening renal function.  I assume this is at least in part to hepatorenal syndrome, which as you know is often times improved after TIPS in addition to decreased ascites production.     If you'd like our team to see him formally in clinic for TIPS consideration, we'd be happy to do so.  Best,  [employer]

## 2021-11-29 NOTE — Progress Notes (Signed)
   Subjective:    Patient ID: Shawn Greaves., male    DOB: 29-Nov-1955, 66 y.o.   MRN: [id number]  HPI Pt arrives to follow up from 11/01/21 visit for HTN. Pt states doing well on medications and no issues with blood pressure at this time. Is checking BP at home.  Pt would like to find out blood type.   Need for vaccination - Plan: Flu Vaccine QUAD 6+ mos PF IM (Fluarix Quad PF)  Essential hypertension, benign  Cirrhosis, non-alcoholic (HCC)  Cirrhosis of liver with ascites, unspecified hepatic cirrhosis type ([place])  Hyperlipidemia associated with type 2 diabetes mellitus ([place])  Morbid obesity ([place])  Type 2 diabetes mellitus with stage 3b chronic kidney disease, without long-term current use of insulin (HCC)   Review of Systems     Objective:   Physical Exam General-in no acute distress Eyes-no discharge Lungs-respiratory rate normal, CTA CV-no murmurs,RRR Extremities skin warm dry no edema Neuro grossly normal Behavior normal, alert Abdomen quite swollen tense consistent with ascites Ankles normal no edema Blood sugar good recent A1c looks good.  Blood pressure good today       Assessment & Plan:  1. Need for vaccination Today - Flu Vaccine QUAD 6+ mos PF IM (Fluarix Quad PF)  2. Essential hypertension, benign Blood pressure good stop lisinopril because of elevated creatinine.  Instead use low-dose amlodipine.  3. Cirrhosis, non-alcoholic ([place]) Patient is interested in having 1 more TAP done preferably later this week if possible because of the upcoming social event  4. Cirrhosis of liver with ascites, unspecified hepatic cirrhosis type ([place]) We talked at length about how long-term prognosis is guarded because of this situation.  I did give him information regarding TIPS he will discuss more with gastroenterology.  He will also discuss with them whether or not transplantation would be of any benefit-not sure if he is a candidate  5. Hyperlipidemia associated  with type 2 diabetes mellitus (HCC) Lipid elevated but given his current liver situation we will not make any adjustments to his medicine  6. Morbid obesity ([place]) A lot of this is due to the ascites patient trying to eat healthy  7. Type 2 diabetes mellitus with stage 3b chronic kidney disease, without long-term current use of insulin (HCC) Recent A1c very good control continue current measures For now May continue the [facility] but nephrology consult will be helpful

## 2021-11-29 NOTE — Telephone Encounter (Signed)
Pt in for appt earlier and forgot to ask provider about finding out his blood type. Advised pt that we do not have it in chart and the only way to get blood type is to donate blood. Pt states he wasn't sure if blood type was in chart since he has had previous surgeries. Please advise. Thank you

## 2021-11-30 ENCOUNTER — Other Ambulatory Visit: Payer: Self-pay | Admitting: *Deleted

## 2021-11-30 DIAGNOSIS — G4733 Obstructive sleep apnea (adult) (pediatric): Secondary | ICD-10-CM | POA: Diagnosis not present

## 2021-11-30 DIAGNOSIS — K7460 Unspecified cirrhosis of liver: Secondary | ICD-10-CM

## 2021-11-30 LAB — CULTURE, BODY FLUID W GRAM STAIN -BOTTLE: Culture: NO GROWTH

## 2021-11-30 NOTE — Telephone Encounter (Signed)
Nurses-I looked through his lab records within epic I do not see blood type within these.  If there is a pressing need to check this you can order a blood type but it may well not be covered by his insurance-it is his choice

## 2021-11-30 NOTE — Telephone Encounter (Signed)
Spoke with patient today. We discussed TIPS, need for liver transplant evaluation given decompensation. Patient has IR consult for 12/03/21 for TIPS. He is agreeable for liver transplant eval and is asking for another tap this week.  Please arrange for patient to see Roosevelt Locks, NP at [facility] in [place] for liver transplant evalution.  Please arrange for abdominal paracentesis this week. Limit to 4 liters removed. Send fluid for cell count/diff, anerobic/aerobic cultures. Given IV albumin 25% 25 grams.

## 2021-11-30 NOTE — Telephone Encounter (Signed)
Referral faxed to [facility].   PARA scheduled for tomorrow, arrival 9:30am. He is aware of appt details.

## 2021-12-01 ENCOUNTER — Ambulatory Visit (HOSPITAL_COMMUNITY)
Admission: RE | Admit: 2021-12-01 | Discharge: 2021-12-01 | Disposition: A | Discharge status: Home / Self Care | Payer: PPO | Source: Ambulatory Visit | Attending: Gastroenterology | Admitting: Gastroenterology

## 2021-12-01 ENCOUNTER — Encounter (HOSPITAL_COMMUNITY): Payer: Self-pay

## 2021-12-01 DIAGNOSIS — R188 Other ascites: Secondary | ICD-10-CM | POA: Insufficient documentation

## 2021-12-01 DIAGNOSIS — K7460 Unspecified cirrhosis of liver: Secondary | ICD-10-CM | POA: Insufficient documentation

## 2021-12-01 LAB — BODY FLUID CELL COUNT WITH DIFFERENTIAL
Eos, Fluid: 1 %
Lymphs, Fluid: 50 %
Monocyte-Macrophage-Serous Fluid: 23 % — ABNORMAL LOW (ref 50–90)
Neutrophil Count, Fluid: 26 % — ABNORMAL HIGH (ref 0–25)
Total Nucleated Cell Count, Fluid: 218 cu mm (ref 0–1000)

## 2021-12-01 LAB — GRAM STAIN

## 2021-12-01 MED ORDER — ALBUMIN HUMAN 25 % IV SOLN
INTRAVENOUS | Status: AC
  Administered 2021-12-01: 25 g
  Filled 2021-12-01: qty 100

## 2021-12-01 MED ORDER — SODIUM CHLORIDE FLUSH 0.9 % IV SOLN
INTRAVENOUS | Status: AC
  Administered 2021-12-01: 10 mL via INTRAVENOUS
  Filled 2021-12-01: qty 10

## 2021-12-01 NOTE — Telephone Encounter (Signed)
Mychart message sent to patient.

## 2021-12-01 NOTE — Procedures (Signed)
   US guided LLQ paracentesis  4 liters (max per MD) obtained Sent for labs  No complication Tolerated well  EBL: less than 1 cc

## 2021-12-01 NOTE — Progress Notes (Signed)
PT tolerated left sided paracentesis procedure and 25 G of IV albumin well today and 4 Liters of hazy yellow colored ascites removed and sent to lab for processing. PT verbalized understanding of discharge instructions and ambulatory at departure with no acute distress noted.

## 2021-12-02 ENCOUNTER — Encounter (HOSPITAL_COMMUNITY): Payer: Self-pay

## 2021-12-02 ENCOUNTER — Ambulatory Visit (HOSPITAL_COMMUNITY)
Admission: RE | Admit: 2021-12-02 | Discharge: 2021-12-02 | Disposition: A | Discharge status: Home / Self Care | Payer: PPO | Source: Ambulatory Visit | Attending: Gastroenterology | Admitting: Gastroenterology

## 2021-12-02 ENCOUNTER — Telehealth: Payer: Self-pay

## 2021-12-02 ENCOUNTER — Other Ambulatory Visit: Payer: Self-pay | Admitting: *Deleted

## 2021-12-02 DIAGNOSIS — R188 Other ascites: Secondary | ICD-10-CM | POA: Diagnosis not present

## 2021-12-02 LAB — BODY FLUID CELL COUNT WITH DIFFERENTIAL
Eos, Fluid: 0 %
Lymphs, Fluid: 33 %
Monocyte-Macrophage-Serous Fluid: 46 % — ABNORMAL LOW (ref 50–90)
Neutrophil Count, Fluid: 21 % (ref 0–25)
Total Nucleated Cell Count, Fluid: 283 cu mm (ref 0–1000)

## 2021-12-02 LAB — GRAM STAIN

## 2021-12-02 LAB — PROTEIN, PLEURAL OR PERITONEAL FLUID: Total protein, fluid: <3 g/dL

## 2021-12-02 NOTE — Telephone Encounter (Signed)
Spoke with patient and he denies any fever or abdominal pain. Pt is aware of need for tap and will be waiting to hear from scheduling.

## 2021-12-02 NOTE — Procedures (Signed)
  Gram + Cocci result : paracentesis 9/27 Worrisome for contaminant per Neil Crouch NP GI Request new fluid draw today for labs only   [place] diagnostic only paracentesis per GI  RLQ paracentesis performed 200 cc cloudy yellow fluid obtained and sent for labs  Tolerated well  EBL: None

## 2021-12-02 NOTE — Progress Notes (Signed)
PT tolerated right sided diagnostic paracentesis procedure well today and 200 mL of cloudy yellow fluid removed and sent to lab for testing. Notation added to labs per Neil Crouch, PA to repeat labs today for possible infections versus contamination of labs done yesterday. Patient verbalized understanding of discharge instructions and ambulatory at departure with no acute distress noted.

## 2021-12-02 NOTE — Telephone Encounter (Signed)
Per Epic. Pt read my chart message 

## 2021-12-02 NOTE — Telephone Encounter (Signed)
Patient's cell count with diff and gram stain yesterday was unremarkable. It is possible that his culture is a contaminant. The aerobic bottle has gram positive cocci. I have discussed with Dr. Abbey Chatters and Roosevelt Locks, NP at [facility].   Please call patient, verify that he has had no change in clinical status such as fever, abdominal pain. If not, then we need to get a diagnostic tap today. Diagnostic tap would mean to remove just enough fluid to send for another cell count with diff, gram stain, protein level, and aerobic and anaerobic cultures. PLEASE MAKE NOTATION THAT WE SUSPECT POSITIVE CULTURE IS CONTAMINATION THEREFORE REPEATING CELL COUNT AND CULTURES.

## 2021-12-02 NOTE — Telephone Encounter (Signed)
Orders placed. Called central scheduling and they asked he go ahead go now.  Called pt.and he is aware.

## 2021-12-02 NOTE — Telephone Encounter (Signed)
Please advise. Thank you

## 2021-12-02 NOTE — Telephone Encounter (Signed)
[facility] called with positive culture results.

## 2021-12-03 ENCOUNTER — Encounter: Payer: Self-pay | Admitting: *Deleted

## 2021-12-03 ENCOUNTER — Ambulatory Visit
Admission: RE | Admit: 2021-12-03 | Discharge: 2021-12-03 | Disposition: A | Discharge status: Home / Self Care | Payer: PPO | Source: Ambulatory Visit | Attending: Internal Medicine | Admitting: Internal Medicine

## 2021-12-03 DIAGNOSIS — R188 Other ascites: Secondary | ICD-10-CM

## 2021-12-03 DIAGNOSIS — K766 Portal hypertension: Secondary | ICD-10-CM | POA: Diagnosis not present

## 2021-12-03 HISTORY — PX: IR RADIOLOGIST EVAL & MGMT: IMG5224
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
## 2021-12-06 LAB — CULTURE, BODY FLUID W GRAM STAIN -BOTTLE: Special Requests: ADEQUATE

## 2021-12-07 ENCOUNTER — Other Ambulatory Visit: Payer: Self-pay | Admitting: *Deleted

## 2021-12-07 ENCOUNTER — Telehealth: Payer: Self-pay

## 2021-12-07 DIAGNOSIS — R188 Other ascites: Secondary | ICD-10-CM

## 2021-12-07 LAB — CULTURE, BODY FLUID W GRAM STAIN -BOTTLE: Culture: NO GROWTH

## 2021-12-07 NOTE — Telephone Encounter (Signed)
Informed pt scheduled para on 12/08/21 at 9:00 am at [facility].  Pt says he can't do that time because he has another doctors appointment tomorrow. Pt given # to Central Scheduling to call and reschedule/

## 2021-12-07 NOTE — Telephone Encounter (Signed)
Pt was made aware and verbalized understanding. Shawn [name]: patient is ready to be scheduled for para.

## 2021-12-07 NOTE — Telephone Encounter (Signed)
Pt called wanting to know if you are going to be ordering to have the fluid drawn again. Pt states that it is needing it. It is hard for him to sit in a chair. He has an appt with the kidney doctor tomorrow.

## 2021-12-07 NOTE — Telephone Encounter (Signed)
See other telephone note and result note with information about last weeks cultures. Consider SBP prophylaxis information provided in result note.  He needs to decide about TIPS, he saw interventional radiology last Friday. We will await nephrology input. We will await [facility] appt for transplant evaluation.   We can set him up for [place] para with 4 liters removed. He should receive IV albumin 25 grams (25% albumin).   Send fluid for cell count/diff, anaerobic and aerobic cultures.

## 2021-12-08 ENCOUNTER — Encounter (HOSPITAL_COMMUNITY): Payer: Self-pay

## 2021-12-08 ENCOUNTER — Ambulatory Visit (HOSPITAL_COMMUNITY)
Admission: RE | Admit: 2021-12-08 | Discharge: 2021-12-08 | Disposition: A | Discharge status: Home / Self Care | Payer: PPO | Source: Ambulatory Visit | Attending: Gastroenterology | Admitting: Gastroenterology

## 2021-12-08 ENCOUNTER — Other Ambulatory Visit (HOSPITAL_COMMUNITY): Payer: PPO

## 2021-12-08 DIAGNOSIS — I129 Hypertensive chronic kidney disease with stage 1 through stage 4 chronic kidney disease, or unspecified chronic kidney disease: Secondary | ICD-10-CM | POA: Diagnosis not present

## 2021-12-08 DIAGNOSIS — R188 Other ascites: Secondary | ICD-10-CM | POA: Diagnosis not present

## 2021-12-08 DIAGNOSIS — K7460 Unspecified cirrhosis of liver: Secondary | ICD-10-CM | POA: Insufficient documentation

## 2021-12-08 DIAGNOSIS — Z7189 Other specified counseling: Secondary | ICD-10-CM | POA: Diagnosis not present

## 2021-12-08 DIAGNOSIS — N189 Chronic kidney disease, unspecified: Secondary | ICD-10-CM | POA: Diagnosis not present

## 2021-12-08 DIAGNOSIS — Z6836 Body mass index (BMI) 36.0-36.9, adult: Secondary | ICD-10-CM | POA: Diagnosis not present

## 2021-12-08 DIAGNOSIS — E1122 Type 2 diabetes mellitus with diabetic chronic kidney disease: Secondary | ICD-10-CM | POA: Diagnosis not present

## 2021-12-08 LAB — GRAM STAIN

## 2021-12-08 LAB — BODY FLUID CELL COUNT WITH DIFFERENTIAL
Eos, Fluid: 0 %
Lymphs, Fluid: 45 %
Monocyte-Macrophage-Serous Fluid: 26 % — ABNORMAL LOW (ref 50–90)
Neutrophil Count, Fluid: 29 % — ABNORMAL HIGH (ref 0–25)
Total Nucleated Cell Count, Fluid: 244 cu mm (ref 0–1000)

## 2021-12-08 MED ORDER — SODIUM CHLORIDE FLUSH 0.9 % IV SOLN
INTRAVENOUS | Status: AC
  Administered 2021-12-08: 10 mL via INTRAVENOUS
  Filled 2021-12-08: qty 10

## 2021-12-08 MED ORDER — ALBUMIN HUMAN 25 % IV SOLN
INTRAVENOUS | Status: AC
  Administered 2021-12-08: 25 g via INTRAVENOUS
  Filled 2021-12-08: qty 100

## 2021-12-08 MED ORDER — ALBUMIN HUMAN 25 % IV SOLN: 0.0000 g | Freq: Once | INTRAVENOUS | Status: AC

## 2021-12-08 NOTE — Procedures (Signed)
  US guided LLQ paracentesis 4 Liters cloudy yellow fluid (max per ordering MD)  Sent for labs per MD  Tolerated well EBL: less than 1 cc

## 2021-12-08 NOTE — Progress Notes (Signed)
Patient tolerated left sided paracentesis procedure and 25G of IV albumin well today and 4 Liters of hazy yellow ascites removed with labs collected and sent for processing. PT verbalized understanding of discharge instructions and ambulatory at departure with no acute distress noted.

## 2021-12-10 ENCOUNTER — Other Ambulatory Visit (HOSPITAL_COMMUNITY): Payer: Self-pay | Admitting: Nephrology

## 2021-12-10 ENCOUNTER — Other Ambulatory Visit: Payer: Self-pay | Admitting: Nephrology

## 2021-12-10 DIAGNOSIS — Z6836 Body mass index (BMI) 36.0-36.9, adult: Secondary | ICD-10-CM

## 2021-12-10 DIAGNOSIS — I120 Hypertensive chronic kidney disease with stage 5 chronic kidney disease or end stage renal disease: Secondary | ICD-10-CM

## 2021-12-10 DIAGNOSIS — K7460 Unspecified cirrhosis of liver: Secondary | ICD-10-CM

## 2021-12-13 LAB — CULTURE, BODY FLUID W GRAM STAIN -BOTTLE: Culture: NO GROWTH

## 2021-12-15 ENCOUNTER — Telehealth: Payer: Self-pay

## 2021-12-15 ENCOUNTER — Other Ambulatory Visit: Payer: Self-pay | Admitting: *Deleted

## 2021-12-15 DIAGNOSIS — R188 Other ascites: Secondary | ICD-10-CM

## 2021-12-15 NOTE — Telephone Encounter (Signed)
Labs were ordered and pt was made aware. Pt states that he is ok with doing the TIPS placement but is wanting to know if it can wait till the first of the year. Pt states that he is more concerned about the fluid at this time.   Manuela Schwartz: Please obtain a copy of the office note from Dr. Theador Hawthorne, Nephrology.

## 2021-12-15 NOTE — Telephone Encounter (Signed)
Can we get copy of office note from Dr. Theador Hawthorne, nephrology?  Need to have him update labs: CMET, PT/INR.  What has he decided about TIPS placement?   Schedule US guided paracentesis.  4 liters to be removed. IV albumin 25%, 25 grams. Send fluid for cell count and diff, anerobic and aerobic cultures.

## 2021-12-15 NOTE — Telephone Encounter (Signed)
Pt called requesting fluid to be drained again. Pt states that it is starting to effect his breathing and causing his back to hurt.

## 2021-12-15 NOTE — Telephone Encounter (Signed)
Pt aware of paracentesis appt details

## 2021-12-16 ENCOUNTER — Ambulatory Visit (HOSPITAL_COMMUNITY)
Admission: RE | Admit: 2021-12-16 | Discharge: 2021-12-16 | Disposition: A | Discharge status: Home / Self Care | Payer: PPO | Source: Ambulatory Visit | Attending: Nephrology | Admitting: Nephrology

## 2021-12-16 DIAGNOSIS — K7460 Unspecified cirrhosis of liver: Secondary | ICD-10-CM | POA: Insufficient documentation

## 2021-12-16 DIAGNOSIS — I120 Hypertensive chronic kidney disease with stage 5 chronic kidney disease or end stage renal disease: Secondary | ICD-10-CM | POA: Insufficient documentation

## 2021-12-16 DIAGNOSIS — Z6836 Body mass index (BMI) 36.0-36.9, adult: Secondary | ICD-10-CM | POA: Insufficient documentation

## 2021-12-16 DIAGNOSIS — N189 Chronic kidney disease, unspecified: Secondary | ICD-10-CM | POA: Diagnosis not present

## 2021-12-16 NOTE — Telephone Encounter (Signed)
Requested Office Note from Dr eBay office

## 2021-12-17 ENCOUNTER — Ambulatory Visit (HOSPITAL_COMMUNITY)
Admission: RE | Admit: 2021-12-17 | Discharge: 2021-12-17 | Disposition: A | Discharge status: Home / Self Care | Payer: PPO | Source: Ambulatory Visit | Attending: Gastroenterology | Admitting: Gastroenterology

## 2021-12-17 ENCOUNTER — Telehealth: Payer: Self-pay | Admitting: Gastroenterology

## 2021-12-17 ENCOUNTER — Other Ambulatory Visit: Payer: Self-pay | Admitting: Family Medicine

## 2021-12-17 ENCOUNTER — Encounter (HOSPITAL_COMMUNITY): Payer: Self-pay

## 2021-12-17 DIAGNOSIS — K7460 Unspecified cirrhosis of liver: Secondary | ICD-10-CM | POA: Diagnosis not present

## 2021-12-17 DIAGNOSIS — R188 Other ascites: Secondary | ICD-10-CM | POA: Insufficient documentation

## 2021-12-17 LAB — GRAM STAIN

## 2021-12-17 MED ORDER — ALBUMIN HUMAN 25 % IV SOLN
INTRAVENOUS | Status: AC
  Administered 2021-12-17: 25 g via INTRAVENOUS
  Filled 2021-12-17: qty 100

## 2021-12-17 MED ORDER — ALBUMIN HUMAN 25 % IV SOLN: 0.0000 g | Freq: Once | INTRAVENOUS | Status: AC

## 2021-12-17 NOTE — Telephone Encounter (Signed)
Received correspondence from ultrasound.  Patient had paracentesis performed today with 4 L of fluid removed.  He reportedly had another 4-6 L of ascites remaining and requested additional paracentesis next week.   Schedule US guided paracentesis.  4 liters to be removed. IV albumin 25%, 25 grams. Send fluid for cell count and diff, anerobic and aerobic cultures.   Please also place orders for updated CMET and PT/INR per Leslie's prior request.  Please advise patient to have these drawn at next paracentesis.  Please advise patient to consider following up with interventional radiology regarding completing TIPS procedure.   FYI to you leslie since you have been following the patient, for any further recommendations.     Venetia Night, MSN, APRN, FNP-BC, AGACNP-BC [facility]

## 2021-12-17 NOTE — Procedures (Addendum)
PreOperative Dx: Nonalcoholic Cirrhosis, ascites Postoperative Dx: Nonalcoholic Cirrhosis, ascites Procedure:   US guided paracentesis Radiologist:  Thornton Papas Anesthesia:  10 ml of1% lidocaine Specimen:  4 L of slightly cloudy yellow ascitic fluid EBL:   < 1 ml Complications:  None

## 2021-12-17 NOTE — Progress Notes (Signed)
PT tolerated right sided paracentesis procedure and 25G of IV albumin well today and 4 Liters of hazy yellow ascites removed with labs collected and sent for processing. PT verbalized understanding of discharge instructions and ambulatory at departure with no acute distress noted.

## 2021-12-20 ENCOUNTER — Other Ambulatory Visit: Payer: Self-pay | Admitting: *Deleted

## 2021-12-20 DIAGNOSIS — R188 Other ascites: Secondary | ICD-10-CM

## 2021-12-20 NOTE — Telephone Encounter (Signed)
Spoke with pt. Aware of PARA appt details. He is also aware to have labs done Wednesday when he goes for his para. Also advised he needs to consider TIPS with IR.

## 2021-12-20 NOTE — Addendum Note (Signed)
Addended by: Cheron Every on: 12/20/2021 08:34 AM   Modules accepted: Orders

## 2021-12-21 ENCOUNTER — Ambulatory Visit: Payer: PPO

## 2021-12-21 DIAGNOSIS — D638 Anemia in other chronic diseases classified elsewhere: Secondary | ICD-10-CM | POA: Diagnosis not present

## 2021-12-21 DIAGNOSIS — N189 Chronic kidney disease, unspecified: Secondary | ICD-10-CM | POA: Diagnosis not present

## 2021-12-21 DIAGNOSIS — Z79899 Other long term (current) drug therapy: Secondary | ICD-10-CM | POA: Diagnosis not present

## 2021-12-21 DIAGNOSIS — R188 Other ascites: Secondary | ICD-10-CM | POA: Diagnosis not present

## 2021-12-21 DIAGNOSIS — I129 Hypertensive chronic kidney disease with stage 1 through stage 4 chronic kidney disease, or unspecified chronic kidney disease: Secondary | ICD-10-CM | POA: Diagnosis not present

## 2021-12-21 DIAGNOSIS — E1122 Type 2 diabetes mellitus with diabetic chronic kidney disease: Secondary | ICD-10-CM | POA: Diagnosis not present

## 2021-12-21 DIAGNOSIS — E559 Vitamin D deficiency, unspecified: Secondary | ICD-10-CM | POA: Diagnosis not present

## 2021-12-21 DIAGNOSIS — K7460 Unspecified cirrhosis of liver: Secondary | ICD-10-CM | POA: Diagnosis not present

## 2021-12-21 DIAGNOSIS — Z6836 Body mass index (BMI) 36.0-36.9, adult: Secondary | ICD-10-CM | POA: Diagnosis not present

## 2021-12-22 ENCOUNTER — Ambulatory Visit (HOSPITAL_COMMUNITY)
Admission: RE | Admit: 2021-12-22 | Discharge: 2021-12-22 | Disposition: A | Discharge status: Home / Self Care | Payer: PPO | Source: Ambulatory Visit | Attending: Gastroenterology | Admitting: Gastroenterology

## 2021-12-22 ENCOUNTER — Encounter (HOSPITAL_COMMUNITY): Payer: Self-pay

## 2021-12-22 DIAGNOSIS — R188 Other ascites: Secondary | ICD-10-CM | POA: Diagnosis not present

## 2021-12-22 DIAGNOSIS — K766 Portal hypertension: Secondary | ICD-10-CM | POA: Diagnosis not present

## 2021-12-22 LAB — BODY FLUID CELL COUNT WITH DIFFERENTIAL
Eos, Fluid: 0 %
Lymphs, Fluid: 57 %
Monocyte-Macrophage-Serous Fluid: 22 % — ABNORMAL LOW (ref 50–90)
Neutrophil Count, Fluid: 21 % (ref 0–25)
Total Nucleated Cell Count, Fluid: 291 cu mm (ref 0–1000)

## 2021-12-22 LAB — GRAM STAIN: Gram Stain: NONE SEEN

## 2021-12-22 LAB — CULTURE, BODY FLUID W GRAM STAIN -BOTTLE: Culture: NO GROWTH

## 2021-12-22 MED ORDER — ALBUMIN HUMAN 25 % IV SOLN: 0.0000 g | Freq: Once | INTRAVENOUS | Status: AC

## 2021-12-22 MED ORDER — ALBUMIN HUMAN 25 % IV SOLN
INTRAVENOUS | Status: AC
  Administered 2021-12-22: 25 g via INTRAVENOUS
  Filled 2021-12-22: qty 100

## 2021-12-22 NOTE — Procedures (Signed)
   US guided LLQ paracentesis 4 liters cloudy fluid obtained (max per MD)  Sent for labs per MD  Tolerated well EBL: scant

## 2021-12-22 NOTE — Progress Notes (Signed)
PT tolerated left sided paracentesis procedure and 25G of IV albumin well today and 4 Liters of cloudy yellow fluid removed with labs collected and sent for processing. PT verbalized understanding of discharge instructions and ambulatory at departure with no acute distress noted. Blood work ordered (PT/INR and CMP) obtained by this nurse also at this time and walked to lab at this time by me and handed to lab tech to check in the samples that the GI office ordered for today.

## 2021-12-23 LAB — PATHOLOGIST SMEAR REVIEW

## 2021-12-23 NOTE — Telephone Encounter (Signed)
Reviewed office note from 12/16/21. Can we please see if they have any lab results back from Dr. Theador Hawthorne.   Patient did not have my labs done last week, CMET/PT/INR. Need to get these done to reassess liver function.   His appt with [facility] is 01/24/22.   I would offer him an appt to follow up with Dr. Gala Romney in the interim. He is going for weekly taps, limited fluid drawn off due to rising creatinine. He has seen IR for possible TIPS but has wanted to wait until after first of the year. I don't believe he fully understands the purpose of the TIPS. We last saw him in 10/2021 so would be beneficial to have a face to face with Dr. Gala Romney while waiting [facility] appt.

## 2021-12-24 NOTE — Telephone Encounter (Signed)
Spoke with pt and he states that he had the blood drawn the last time he had fluid drawn. Pt states that Dr. Theador Hawthorne also drew a bunch of labs. Pt is scheduled with Dr. Gala Romney. Manuela Schwartz: Can you get recent lab results from Dr. Toya Smothers office.

## 2021-12-27 LAB — CULTURE, BODY FLUID W GRAM STAIN -BOTTLE
Culture: NO GROWTH
Special Requests: ADEQUATE

## 2021-12-28 ENCOUNTER — Telehealth: Payer: Self-pay

## 2021-12-28 NOTE — Telephone Encounter (Signed)
Pt called requesting fluid to be removed tomorrow. Pt states that he contacted the Dr. Regarding the taps but that he has to do lab work before hand and that they are in the process of arranging all of that. Please advise.

## 2021-12-29 ENCOUNTER — Other Ambulatory Visit: Payer: Self-pay | Admitting: Interventional Radiology

## 2021-12-29 ENCOUNTER — Other Ambulatory Visit: Payer: Self-pay | Admitting: *Deleted

## 2021-12-29 DIAGNOSIS — Z01810 Encounter for preprocedural cardiovascular examination: Secondary | ICD-10-CM

## 2021-12-29 DIAGNOSIS — R188 Other ascites: Secondary | ICD-10-CM

## 2021-12-29 DIAGNOSIS — K7460 Unspecified cirrhosis of liver: Secondary | ICD-10-CM

## 2021-12-29 LAB — BODY FLUID CELL COUNT WITH DIFFERENTIAL
Eos, Fluid: 0 %
Lymphs, Fluid: 58 %
Monocyte-Macrophage-Serous Fluid: 19 % — ABNORMAL LOW (ref 50–90)
Neutrophil Count, Fluid: 23 % (ref 0–25)
Total Nucleated Cell Count, Fluid: 300 cu mm (ref 0–1000)

## 2021-12-29 NOTE — Telephone Encounter (Signed)
Standing order for US paracentesis for next 4 weeks.  Can have 4 liters drawn off once per week? Give iv albumin 25% 25 grams with each paracentesis. Send fluid for cell count with diff and anaerobic/aerobic cultures each time.  Tammy, see other telephone note. Manuela Schwartz requested labs from PCP but need from Dr. Theador Hawthorne asap.

## 2021-12-29 NOTE — Telephone Encounter (Signed)
Pt informed to be at [facility] 12/31/21 arrive at 10:45 am.  Pt has the number to Central Scheduling to call and schedule the other paracentesis.

## 2021-12-29 NOTE — Telephone Encounter (Signed)
Need labs from Dr. Toya Smothers office asap. PCP office may not have them. Please request of Dr. Toya Smothers.

## 2021-12-29 NOTE — Telephone Encounter (Signed)
Pt called and left a message regarding this. Is requesting fluid to be drawn off so that he can breath.

## 2021-12-29 NOTE — Telephone Encounter (Signed)
Reviewed labs from December 21, 2021: Sodium 136, potassium 5.1, BUN 44, creatinine 2.2, alkaline phosphatase 152, albumin 3.2, total bilirubin 0.7, AST 47, ALT 35, iron 61, ferritin 282, iron saturation was 31%, TIBC 199, B12 greater than 2000, white blood cell count 7000, hemoglobin 14.4, platelets 180,000, A1c 5.6, ANA negative, hepatitis B surface antigen negative, hepatitis B surface antibody positive.  Looks like he now has appt with [facility] 01/24/[facility]. He moved his appt with Dr. Gala Romney from 10/31/223 to 01/25/22. I requested urgent ov since [facility] appt was not until 01/2022. INR was not done with labs so I could not calculate MELD score.  IR working on preop orders for TIPS. Going for paracentesis this week, orders placed today for standing orders for 4 weeks.   AWAIT PRE-OP LABS FOR TIPS.

## 2021-12-30 NOTE — Telephone Encounter (Signed)
Noted  

## 2021-12-31 ENCOUNTER — Ambulatory Visit (HOSPITAL_COMMUNITY)
Admission: RE | Admit: 2021-12-31 | Discharge: 2021-12-31 | Disposition: A | Discharge status: Home / Self Care | Payer: PPO | Source: Ambulatory Visit | Attending: Family Medicine | Admitting: Family Medicine

## 2021-12-31 ENCOUNTER — Other Ambulatory Visit: Payer: Self-pay | Admitting: Family Medicine

## 2021-12-31 ENCOUNTER — Encounter (HOSPITAL_COMMUNITY): Payer: Self-pay

## 2021-12-31 ENCOUNTER — Other Ambulatory Visit (HOSPITAL_COMMUNITY): Payer: Self-pay | Admitting: Family Medicine

## 2021-12-31 ENCOUNTER — Ambulatory Visit (HOSPITAL_COMMUNITY)
Admission: RE | Admit: 2021-12-31 | Discharge: 2021-12-31 | Disposition: A | Discharge status: Home / Self Care | Payer: PPO | Source: Ambulatory Visit | Attending: Gastroenterology | Admitting: Gastroenterology

## 2021-12-31 DIAGNOSIS — K7460 Unspecified cirrhosis of liver: Secondary | ICD-10-CM | POA: Insufficient documentation

## 2021-12-31 DIAGNOSIS — I739 Peripheral vascular disease, unspecified: Secondary | ICD-10-CM

## 2021-12-31 DIAGNOSIS — R188 Other ascites: Secondary | ICD-10-CM | POA: Diagnosis not present

## 2021-12-31 LAB — BODY FLUID CELL COUNT WITH DIFFERENTIAL
Eos, Fluid: 0 %
Lymphs, Fluid: 44 %
Monocyte-Macrophage-Serous Fluid: 27 % — ABNORMAL LOW (ref 50–90)
Neutrophil Count, Fluid: 29 % — ABNORMAL HIGH (ref 0–25)
Total Nucleated Cell Count, Fluid: 279 cu mm (ref 0–1000)

## 2021-12-31 LAB — GRAM STAIN

## 2021-12-31 NOTE — Procedures (Signed)
PreOperative Dx: Cirrhosis, ascites Postoperative Dx: Cirrhosis, ascites Procedure:   US guided paracentesis Radiologist:  Thornton Papas Anesthesia:  10 ml of1% lidocaine Specimen:  4.0 L of yellow ascitic fluid EBL:   < 1 ml Complications:  None

## 2021-12-31 NOTE — Progress Notes (Signed)
Paracentesis complete no signs of distress.  

## 2022-01-04 ENCOUNTER — Ambulatory Visit: Payer: PPO | Admitting: Internal Medicine

## 2022-01-04 ENCOUNTER — Ambulatory Visit (INDEPENDENT_AMBULATORY_CARE_PROVIDER_SITE_OTHER): Payer: PPO

## 2022-01-04 VITALS — Ht 65.0 in | Wt 223.0 lb

## 2022-01-04 DIAGNOSIS — Z0000 Encounter for general adult medical examination without abnormal findings: Secondary | ICD-10-CM

## 2022-01-04 NOTE — Patient Instructions (Signed)
Shawn [name] , Thank you for taking time to come for your Medicare Wellness Visit. I appreciate your ongoing commitment to your health goals. Please review the following plan we discussed and let me know if I can assist you in the future.   Screening recommendations/referrals: Colonoscopy: referral on hold at the moment Recommended yearly ophthalmology/optometry visit for glaucoma screening and checkup Recommended yearly dental visit for hygiene and checkup  Vaccinations: Influenza vaccine: 11/29/21 Pneumococcal vaccine: 12/19/13 Tdap vaccine: n/d Shingles vaccine: n/d   Covid-19: 03/09/20  Advanced directives: no  Conditions/risks identified: none  Next appointment: Follow up in one year for your annual wellness visit 01/24/23 @ 8:15 am by phone  Preventive Care 40-64 Years, Male Preventive care refers to lifestyle choices and visits with your health care provider that can promote health and wellness. What does preventive care include? A yearly physical exam. This is also called an annual well check. Dental exams once or twice a year. Routine eye exams. Ask your health care provider how often you should have your eyes checked. Personal lifestyle choices, including: Daily care of your teeth and gums. Regular physical activity. Eating a healthy diet. Avoiding tobacco and drug use. Limiting alcohol use. Practicing safe sex. Taking low-dose aspirin every day starting at age 5. What happens during an annual well check? The services and screenings done by your health care provider during your annual well check will depend on your age, overall health, lifestyle risk factors, and family history of disease. Counseling  Your health care provider may ask you questions about your: Alcohol use. Tobacco use. Drug use. Emotional well-being. Home and relationship well-being. Sexual activity. Eating habits. Work and work Statistician. Screening  You may have the following tests or  measurements: Height, weight, and BMI. Blood pressure. Lipid and cholesterol levels. These may be checked every 5 years, or more frequently if you are over 4 years old. Skin check. Lung cancer screening. You may have this screening every year starting at age 79 if you have a 30-pack-year history of smoking and currently smoke or have quit within the past 15 years. Fecal occult blood test (FOBT) of the stool. You may have this test every year starting at age 32. Flexible sigmoidoscopy or colonoscopy. You may have a sigmoidoscopy every 5 years or a colonoscopy every 10 years starting at age 50. Prostate cancer screening. Recommendations will vary depending on your family history and other risks. Hepatitis C blood test. Hepatitis B blood test. Sexually transmitted disease (STD) testing. Diabetes screening. This is done by checking your blood sugar (glucose) after you have not eaten for a while (fasting). You may have this done every 1-3 years. Discuss your test results, treatment options, and if necessary, the need for more tests with your health care provider. Vaccines  Your health care provider may recommend certain vaccines, such as: Influenza vaccine. This is recommended every year. Tetanus, diphtheria, and acellular pertussis (Tdap, Td) vaccine. You may need a Td booster every 10 years. Zoster vaccine. You may need this after age 21. Pneumococcal 13-valent conjugate (PCV13) vaccine. You may need this if you have certain conditions and have not been vaccinated. Pneumococcal polysaccharide (PPSV23) vaccine. You may need one or two doses if you smoke cigarettes or if you have certain conditions. Talk to your health care provider about which screenings and vaccines you need and how often you need them. This information is not intended to replace advice given to you by your health care provider. Make sure you discuss  any questions you have with your health care provider. Document Released:  03/20/2015 Document Revised: 11/11/2015 Document Reviewed: 12/23/2014 Elsevier Interactive Patient Education  2017 [place] Prevention in the Home Falls can cause injuries. They can happen to people of all ages. There are many things you can do to make your home safe and to help prevent falls. What can I do on the outside of my home? Regularly fix the edges of walkways and driveways and fix any cracks. Remove anything that might make you trip as you walk through a door, such as a raised step or threshold. Trim any bushes or trees on the path to your home. Use bright outdoor lighting. Clear any walking paths of anything that might make someone trip, such as rocks or tools. Regularly check to see if handrails are loose or broken. Make sure that both sides of any steps have handrails. Any raised decks and porches should have guardrails on the edges. Have any leaves, snow, or ice cleared regularly. Use sand or salt on walking paths during winter. Clean up any spills in your garage right away. This includes oil or grease spills. What can I do in the bathroom? Use night lights. Install grab bars by the toilet and in the tub and shower. Do not use towel bars as grab bars. Use non-skid mats or decals in the tub or shower. If you need to sit down in the shower, use a plastic, non-slip stool. Keep the floor dry. Clean up any water that spills on the floor as soon as it happens. Remove soap buildup in the tub or shower regularly. Attach bath mats securely with double-sided non-slip rug tape. Do not have throw rugs and other things on the floor that can make you trip. What can I do in the bedroom? Use night lights. Make sure that you have a light by your bed that is easy to reach. Do not use any sheets or blankets that are too big for your bed. They should not hang down onto the floor. Have a firm chair that has side arms. You can use this for support while you get dressed. Do not have  throw rugs and other things on the floor that can make you trip. What can I do in the kitchen? Clean up any spills right away. Avoid walking on wet floors. Keep items that you use a lot in easy-to-reach places. If you need to reach something above you, use a strong step stool that has a grab bar. Keep electrical cords out of the way. Do not use floor polish or wax that makes floors slippery. If you must use wax, use non-skid floor wax. Do not have throw rugs and other things on the floor that can make you trip. What can I do with my stairs? Do not leave any items on the stairs. Make sure that there are handrails on both sides of the stairs and use them. Fix handrails that are broken or loose. Make sure that handrails are as long as the stairways. Check any carpeting to make sure that it is firmly attached to the stairs. Fix any carpet that is loose or worn. Avoid having throw rugs at the top or bottom of the stairs. If you do have throw rugs, attach them to the floor with carpet tape. Make sure that you have a light switch at the top of the stairs and the bottom of the stairs. If you do not have them, ask someone to add  them for you. What else can I do to help prevent falls? Wear shoes that: Do not have high heels. Have rubber bottoms. Are comfortable and fit you well. Are closed at the toe. Do not wear sandals. If you use a stepladder: Make sure that it is fully opened. Do not climb a closed stepladder. Make sure that both sides of the stepladder are locked into place. Ask someone to hold it for you, if possible. Clearly mark and make sure that you can see: Any grab bars or handrails. First and last steps. Where the edge of each step is. Use tools that help you move around (mobility aids) if they are needed. These include: Canes. Walkers. Scooters. Crutches. Turn on the lights when you go into a dark area. Replace any light bulbs as soon as they burn out. Set up your furniture so  you have a clear path. Avoid moving your furniture around. If any of your floors are uneven, fix them. If there are any pets around you, be aware of where they are. Review your medicines with your doctor. Some medicines can make you feel dizzy. This can increase your chance of falling. Ask your doctor what other things that you can do to help prevent falls. This information is not intended to replace advice given to you by your health care provider. Make sure you discuss any questions you have with your health care provider. Document Released: 12/18/2008 Document Revised: 07/30/2015 Document Reviewed: 03/28/2014 Elsevier Interactive Patient Education  2017 [employer].

## 2022-01-04 NOTE — Progress Notes (Signed)
Virtual Visit via Telephone Note  I connected with  Shawn [name]. on 01/04/22 at  1:00 PM EDT by telephone and verified that I am speaking with the correct person using two identifiers.  Location: Patient: home Provider: [facility] Persons participating in the virtual visit: patient/Nurse Health Advisor   I discussed the limitations, risks, security and privacy concerns of performing an evaluation and management service by telephone and the availability of in person appointments. The patient expressed understanding and agreed to proceed.  Interactive audio and video telecommunications were attempted between this nurse and patient, however failed, due to patient having technical difficulties OR patient did not have access to video capability.  We continued and completed visit with audio only.  Some vital signs may be absent or patient reported.   Dionisio David, LPN  Subjective:   Shawn [name]. is a 66 y.o. male who presents for Medicare Annual/Subsequent preventive examination.  Review of Systems     Cardiac Risk Factors include: advanced age (>58mn, >>90women);diabetes mellitus;dyslipidemia;male gender     Objective:    There were no vitals filed for this visit. There is no height or weight on file to calculate BMI.     01/04/2022    1:14 PM 07/21/2021   12:37 PM 12/28/2015    8:14 AM 04/27/2015    8:41 AM  Advanced Directives  Does Patient Have a Medical Advance Directive? No No No No  Would patient like information on creating a medical advance directive? No - Patient declined Yes (MAU/Ambulatory/Procedural Areas - Information given)  No - patient declined information    Current Medications (verified) Outpatient Encounter Medications as of 01/04/2022  Medication Sig   amLODipine (NORVASC) 10 MG tablet    amLODipine (NORVASC) 2.5 MG tablet Take 1 tablet (2.5 mg total) by mouth daily.   amphetamine-dextroamphetamine (ADDERALL XR) 10 MG 24 hr capsule    ascorbic  acid (VITAMIN C) 500 MG tablet Take 500 mg by mouth daily.   blood glucose meter kit and supplies KIT Dispense based on patient and insurance preference. Use daily as directed. (FOR ICD - 10 E11.9)   blood glucose meter kit and supplies Dispense based on patient and insurance preference. Use to check sugars once per day. Dx.E11.9   buPROPion (WELLBUTRIN XL) 300 MG 24 hr tablet    canagliflozin (INVOKANA) 100 MG TABS tablet Take 1 tablet (100 mg total) by mouth daily before breakfast.   Cholecalciferol 50 MCG (2000 UT) TABS Take 1 tablet by mouth daily.   clonazePAM (KLONOPIN) 0.5 MG tablet 1 pill taken 2 or 3 times a day for essential tremor   Cyanocobalamin (B-12 PO) Take 1 tablet by mouth daily. 500 mg daily   diazepam (VALIUM) 10 MG tablet    dorzolamide (TRUSOPT) 2 % ophthalmic solution 1 drop 2 (two) times daily.   fluticasone (FLONASE) 50 MCG/ACT nasal spray Place 2 sprays into both nostrils daily.   furosemide (LASIX) 20 MG tablet TAKE 2 TABLETS (40 MG TOTAL) BY MOUTH DAILY.   gabapentin (NEURONTIN) 100 MG capsule Take 1 capsule 3 times a day by oral route.   glucose blood (ONETOUCH VERIO) test strip USE AS INSTRUCTED TO TEST BLOOD SUGAR DAILY   hydrochlorothiazide (HYDRODIURIL) 12.5 MG tablet    Lancets (ONETOUCH DELICA PLUS [id number] MISC USE TO TEST BLOOD SUGAR DAILY   lisinopril (ZESTRIL) 40 MG tablet    metFORMIN (GLUCOPHAGE) 500 MG tablet 1 qd   metoprolol succinate (TOPROL-XL) 25 MG 24  hr tablet Take 1 tablet (25 mg total) by mouth daily. with food   Milk Thistle 1000 MG CAPS Take by mouth.   Omega-3 Fatty Acids (FISH OIL) 1000 MG CAPS Take 1 capsule by mouth in the morning, at noon, and at bedtime.   pantoprazole (PROTONIX) 40 MG tablet Take 1 tablet (40 mg total) by mouth daily.   potassium chloride SA (KLOR-CON M20) 20 MEQ tablet TAKE 1 TABLET BY MOUTH EVERY DAY   pravastatin (PRAVACHOL) 80 MG tablet    rosuvastatin (CRESTOR) 20 MG tablet Take 1 tablet (20 mg total) by mouth  daily.   spironolactone (ALDACTONE) 50 MG tablet Take 1 tablet (50 mg total) by mouth daily.   venlafaxine XR (EFFEXOR-XR) 150 MG 24 hr capsule Take 1 capsule (150 mg total) by mouth 2 (two) times daily.   vitamin E 180 MG (400 UNITS) capsule Take by mouth.   zinc gluconate 50 MG tablet Take 50 mg by mouth daily.   [DISCONTINUED] metFORMIN (GLUCOPHAGE) 500 MG tablet Take 1 tablet twice a day by oral route.   No facility-administered encounter medications on file as of 01/04/2022.    Allergies (verified) Patient has no known allergies.   History: Past Medical History:  Diagnosis Date   ADD (attention deficit disorder)    ADHD    Anxiety 1987   mild (crowds, noises)   Anxiety    Attention deficit disorder    Cancer (HCC)    Cirrhosis of liver (HCC)    CVA (cerebral vascular accident) ([place])    Depression    Diabetic neuropathy ([place])    Essential hypertension, benign    Long-standing history, negative secondary workup   Headache    History of cardiac catheterization    Widely patent coronary and renal arteries November 2013   Hyperlipidemia    Hypertension    whitecoat   Obstructive sleep apnea    cpap   Prediabetes    Sleep apnea    TIA (transient ischemic attack)    Possible, 2007   Type 2 diabetes mellitus (HCC)    Venous stasis    Past Surgical History:  Procedure Laterality Date   ANTERIOR CERVICAL DECOMP/DISCECTOMY FUSION N/A 12/30/2015   Procedure: ANTERIOR CERVICAL DECOMPRESSION/DISCECTOMY FUSION C6 - C7 1 LEVEL;  Surgeon: Melina Schools, MD;  Location: [facility];  Service: Orthopedics;  Laterality: N/A;   CARDIAC CATHETERIZATION  01/2012   negative   CARPAL TUNNEL RELEASE     CERVICAL SPINE NERVE BLOCK  05/17/2017   IR RADIOLOGIST EVAL & MGMT  12/03/2021   KNEE SURGERY Right    NECK SURGERY     VASECTOMY     Family History  Problem Relation Age of Onset   Colon cancer Mother    Hypertension Mother    CAD Mother    Breast cancer Mother    Cirrhosis Brother     Social History   Socioeconomic History   Marital status: Married    Spouse name: Not on file   Number of children: 4   Years of education: Not on file   Highest education level: Not on file  Occupational History   Occupation: Barrister's clerk   Tobacco Use   Smoking status: Never   Smokeless tobacco: Current    Types: Snuff   Tobacco comments:    Uses dip pouches occasionally  Vaping Use   Vaping Use: Never used  Substance and Sexual Activity   Alcohol use: No    Alcohol/week: 0.0 standard drinks of  alcohol    Comment: Previously 1-2 cases of beer/week. patient states never on regular basis though. last etoh in 2013.    Drug use: No   Sexual activity: Not Currently  Other Topics Concern   Not on file  Social History Narrative   ** Merged History Encounter **       Social Determinants of Health   Financial Resource Strain: Low Risk  (01/04/2022)   Overall Financial Resource Strain (CARDIA)    Difficulty of Paying Living Expenses: Not hard at all  Food Insecurity: No Food Insecurity (01/04/2022)   Hunger Vital Sign    Worried About Running Out of Food in the Last Year: Never true    Ran Out of Food in the Last Year: Never true  Transportation Needs: No Transportation Needs (01/04/2022)   PRAPARE - Hydrologist (Medical): No    Lack of Transportation (Non-Medical): No  Physical Activity: Inactive (01/04/2022)   Exercise Vital Sign    Days of Exercise per Week: 0 days    Minutes of Exercise per Session: 0 min  Stress: No Stress Concern Present (01/04/2022)   [place]    Feeling of Stress : Only a little  Social Connections: Moderately Integrated (01/04/2022)   Social Connection and Isolation Panel [NHANES]    Frequency of Communication with Friends and Family: More than three times a week    Frequency of Social Gatherings with Friends and Family: Once a week    Attends  Religious Services: More than 4 times per year    Active Member of [employer] or Organizations: No    Attends Music therapist: Never    Marital Status: Married    Tobacco Counseling Ready to quit: Not Answered Counseling given: Not Answered Tobacco comments: Uses dip pouches occasionally   Clinical Intake:  Pre-visit preparation completed: Yes  Pain : No/denies pain     Diabetes: Yes CBG done?: No Did pt. bring in CBG monitor from home?: No  How often do you need to have someone help you when you read instructions, pamphlets, or other written materials from your doctor or pharmacy?: 1 - Never  Diabetic?yes Nutrition Risk Assessment:  Has the patient had any N/V/D within the last 2 months?  Yes  Does the patient have any non-healing wounds?  No  Has the patient had any unintentional weight loss or weight gain?  No   Diabetes:  Is the patient diabetic?  Yes  If diabetic, was a CBG obtained today?  No  Did the patient bring in their glucometer from home?  No  How often do you monitor your CBG's? Every day   Financial Strains and Diabetes Management:  Are you having any financial strains with the device, your supplies or your medication? No .  Does the patient want to be seen by Chronic Care Management for management of their diabetes?  No  Would the patient like to be referred to a Nutritionist or for Diabetic Management?  No   Diabetic Exams:  Diabetic Eye Exam: Completed 03/11/21.  Pt has been advised about the importance in completing this exam.  Diabetic Foot Exam: Completed 05/11/20. Pt has been advised about the importance in completing this exam.    Interpreter Needed?: No  Information entered by :: Kirke Shaggy, LPN   Activities of Daily Living    01/04/2022    1:16 PM 01/03/2022    1:44 PM  In  your present state of health, do you have any difficulty performing the following activities:  Hearing? 0 0  Vision? 0 0  Difficulty concentrating or  making decisions? 0 0  Walking or climbing stairs? 1 1  Dressing or bathing? 0 1  Doing errands, shopping? 0 0  Preparing Food and eating ? N N  Using the Toilet? N N  In the past six months, have you accidently leaked urine? N N  Do you have problems with loss of bowel control? N N  Managing your Medications? N N  Managing your Finances? N N  Housekeeping or managing your Housekeeping? N N    Patient Care Team: [name], MD as PCP - General (Family Medicine) [name], MD as Consulting Physician (Gastroenterology) [name], MD as Consulting Physician (Nephrology)  Indicate any recent Medical Services you may have received from other than [facility] providers in the past year (date may be approximate).     Assessment:   This is a routine wellness examination for Cornelious.  Hearing/Vision screen Hearing Screening - Comments:: No aids Vision Screening - Comments:: Wears glasses- Dr.Llerena  Dietary issues and exercise activities discussed: Current Exercise Habits: The patient does not participate in regular exercise at present   Goals Addressed             This Visit's Progress    DIET - EAT MORE FRUITS AND VEGETABLES         Depression Screen    01/04/2022    1:13 PM 07/21/2021   12:46 PM 07/21/2021   12:33 PM 12/30/2020    9:40 AM 05/11/2020    1:20 PM 12/02/2019    9:55 AM 01/17/2018    9:45 AM  PHQ 2/9 Scores  PHQ - 2 Score 0 _0 PHQ- 9 Score 0   _1 Fall Risk    01/04/2022    1:15 PM 01/03/2022    1:44 PM 12/20/2021    9:01 PM 07/21/2021   12:33 PM 12/02/2019    9:07 AM  Fall Risk   Falls in the past year? 0 1 0 0 0  Number falls in past yr: 0 1 0    Injury with Fall? 0 0 0    Risk for fall due to : No Fall Risks;History of fall(s)      Follow up Falls prevention discussed;Falls evaluation completed    Falls evaluation completed    FALL RISK PREVENTION PERTAINING TO THE HOME:  Any stairs in or around the home? No   If so, are there any without handrails? No  Home free of loose throw rugs in walkways, pet beds, electrical cords, etc? Yes  Adequate lighting in your home to reduce risk of falls? Yes   ASSISTIVE DEVICES UTILIZED TO PREVENT FALLS:  Life alert? No  Use of a cane, walker or w/c? No  Grab bars in the bathroom? Yes  Shower chair or bench in shower? Yes  Elevated toilet seat or a handicapped toilet? No    Cognitive Function:        01/04/2022    1:16 PM  6CIT Screen  What Year? 0 points  What month? 0 points  What time? 0 points  Count back from 20 0 points  Months in reverse 0 points  Repeat phrase 0 points  Total Score 0 points    Immunizations Immunization History  Administered Date(s) Administered   Influenza Split  01/05/2014   Influenza,inj,Quad PF,6+ Mos 12/03/2015, 01/17/2018, 12/30/2020, 11/29/2021   Influenza-Unspecified 12/12/2016   PFIZER(Purple Top)SARS-COV-2 Vaccination 03/09/2020   Pneumococcal Polysaccharide-23 12/19/2013    TDAP status: Due, Education has been provided regarding the importance of this vaccine. Advised may receive this vaccine at local pharmacy or Health Dept. Aware to provide a copy of the vaccination record if obtained from local pharmacy or Health Dept. Verbalized acceptance and understanding.  Flu Vaccine status: Up to date  Pneumococcal vaccine status: Due, Education has been provided regarding the importance of this vaccine. Advised may receive this vaccine at local pharmacy or Health Dept. Aware to provide a copy of the vaccination record if obtained from local pharmacy or Health Dept. Verbalized acceptance and understanding.  Covid-19 vaccine status: Completed vaccines  Qualifies for Shingles Vaccine? Yes   Zostavax completed No   Shingrix Completed?: No.    Education has been provided regarding the importance of this vaccine. Patient has been advised to call insurance company to determine out of pocket expense if they have not  yet received this vaccine. Advised may also receive vaccine at local pharmacy or Health Dept. Verbalized acceptance and understanding.  Screening Tests Health Maintenance  Topic Date Due   TETANUS/TDAP  Never done   Zoster Vaccines- Shingrix (1 of 2) Never done   COLONOSCOPY (Pts 45-46yr Insurance coverage will need to be confirmed)  Never done   COVID-19 Vaccine (2 - Pfizer risk series) 03/30/2020   FOOT EXAM  05/11/2021   OPHTHALMOLOGY EXAM  03/11/2022   HEMOGLOBIN A1C  04/20/2022   Diabetic kidney evaluation - Urine ACR  11/25/2022   Diabetic kidney evaluation - GFR measurement  11/26/2022   Medicare Annual Wellness (AWV)  01/05/2023   INFLUENZA VACCINE  Completed   Hepatitis C Screening  Completed   HIV Screening  Completed   HPV VACCINES  Aged Out    Health Maintenance  Health Maintenance Due  Topic Date Due   TETANUS/TDAP  Never done   Zoster Vaccines- Shingrix (1 of 2) Never done   COLONOSCOPY (Pts 45-451yrInsurance coverage will need to be confirmed)  Never done   COVID-19 Vaccine (2 - Pfizer risk series) 03/30/2020   FOOT EXAM  05/11/2021    Colorectal cancer screening: Referral to GI placed ON HOLD. Pt aware the office will call re: appt.  Lung Cancer Screening: (Low Dose CT Chest recommended if Age 64-80ears, 30 pack-year currently smoking OR have quit w/in 15years.) does not qualify.     Additional Screening:  Hepatitis C Screening: does qualify; Completed 12/27/17  Vision Screening: Recommended annual ophthalmology exams for early detection of glaucoma and other disorders of the eye. Is the patient up to date with their annual eye exam?  Yes  Who is the provider or what is the name of the office in which the patient attends annual eye exams? Dr.Nutter If pt is not established with a provider, would they like to be referred to a provider to establish care? No .   Dental Screening: Recommended annual dental exams for proper oral hygiene  Community  Resource Referral / Chronic Care Management: CRR required this visit?  No   CCM required this visit?  No      Plan:     I have personally reviewed and noted the following in the patient's chart:   Medical and social history Use of alcohol, tobacco or illicit drugs  Current medications and supplements including opioid prescriptions. Patient is not currently taking opioid  prescriptions. Functional ability and status Nutritional status Physical activity Advanced directives List of other physicians Hospitalizations, surgeries, and ER visits in previous 12 months Vitals Screenings to include cognitive, depression, and falls Referrals and appointments  In addition, I have reviewed and discussed with patient certain preventive protocols, quality metrics, and best practice recommendations. A written personalized care plan for preventive services as well as general preventive health recommendations were provided to patient.     Dionisio David, LPN   12/03/5745   Nurse Notes: none

## 2022-01-05 ENCOUNTER — Ambulatory Visit (HOSPITAL_COMMUNITY)
Admission: RE | Admit: 2022-01-05 | Discharge: 2022-01-05 | Disposition: A | Discharge status: Home / Self Care | Payer: PPO | Source: Ambulatory Visit | Attending: Interventional Radiology | Admitting: Interventional Radiology

## 2022-01-05 DIAGNOSIS — Z01810 Encounter for preprocedural cardiovascular examination: Secondary | ICD-10-CM | POA: Insufficient documentation

## 2022-01-05 LAB — CULTURE, BODY FLUID W GRAM STAIN -BOTTLE: Culture: NO GROWTH

## 2022-01-05 LAB — ECHOCARDIOGRAM COMPLETE
Area-P 1/2: 2.07 cm2
S' Lateral: 1.4 cm

## 2022-01-05 NOTE — Progress Notes (Signed)
*  PRELIMINARY RESULTS* Echocardiogram 2D Echocardiogram has been performed.  Shawn [name] 01/05/2022, 10:24 AM

## 2022-01-06 ENCOUNTER — Encounter (HOSPITAL_COMMUNITY): Payer: Self-pay

## 2022-01-06 ENCOUNTER — Other Ambulatory Visit: Payer: Self-pay | Admitting: Interventional Radiology

## 2022-01-06 ENCOUNTER — Ambulatory Visit (HOSPITAL_COMMUNITY)
Admission: RE | Admit: 2022-01-06 | Discharge: 2022-01-06 | Disposition: A | Discharge status: Home / Self Care | Payer: PPO | Source: Ambulatory Visit | Attending: Gastroenterology | Admitting: Gastroenterology

## 2022-01-06 DIAGNOSIS — R188 Other ascites: Secondary | ICD-10-CM | POA: Insufficient documentation

## 2022-01-06 DIAGNOSIS — K7581 Nonalcoholic steatohepatitis (NASH): Secondary | ICD-10-CM

## 2022-01-06 DIAGNOSIS — K7460 Unspecified cirrhosis of liver: Secondary | ICD-10-CM | POA: Diagnosis not present

## 2022-01-06 LAB — GRAM STAIN

## 2022-01-06 LAB — BODY FLUID CELL COUNT WITH DIFFERENTIAL
Eos, Fluid: 0 %
Lymphs, Fluid: 47 %
Monocyte-Macrophage-Serous Fluid: 35 % — ABNORMAL LOW (ref 50–90)
Neutrophil Count, Fluid: 18 % (ref 0–25)
Total Nucleated Cell Count, Fluid: 255 cu mm (ref 0–1000)

## 2022-01-06 MED ORDER — ALBUMIN HUMAN 25 % IV SOLN: 0.0000 g | Freq: Once | INTRAVENOUS | Status: AC

## 2022-01-06 MED ORDER — ALBUMIN HUMAN 25 % IV SOLN
INTRAVENOUS | Status: AC
  Administered 2022-01-06: 25 g via INTRAVENOUS
  Filled 2022-01-06: qty 100

## 2022-01-06 NOTE — Procedures (Signed)
PreOperative Dx: Cirrhosis, ascites Postoperative Dx: Cirrhosis, ascites Procedure:   US guided paracentesis Radiologist:  Thornton Papas Anesthesia:  10 ml of1% lidocaine Specimen:  4 L of cloudy yellow ascitic fluid EBL:   < 1 ml Complications:  None

## 2022-01-06 NOTE — Progress Notes (Signed)
Right sided paracentesis procedure and 25G of Albumin tolerated well today and 4 L of cloudy yellow ascites removed with labs sent for processing. PT verbalized understanding of discharge instructions and ambulatory at departure with no acute distress noted.

## 2022-01-10 ENCOUNTER — Telehealth: Payer: Self-pay | Admitting: Gastroenterology

## 2022-01-10 NOTE — Telephone Encounter (Signed)
Pt was made aware and verbalized understanding.  

## 2022-01-10 NOTE — Telephone Encounter (Signed)
Received labs from Dr. Toya Smothers office dated December 21, 2021:  Sodium 136, potassium 5.1, BUN 44, creatinine 2.2, glucose 110, magnesium 2.5, alkaline phosphatase 152, albumin 3.2, AST 47, ALT 35, total bilirubin 0.7, iron 61, ferritin 282, iron saturations 31%, B12 greater than 2000, white blood cell count 7000, hemoglobin 14.4, platelets 180,000, A1c 5.6, hepatitis B surface antibody positive, hepatitis B surface antigen negative, hepatitis C antibody negative.  Patient needs to keep all follow-up appointments with Dr. Theador Hawthorne, Dr. Gala Romney, [place]ocks, NP at [facility].

## 2022-01-11 ENCOUNTER — Ambulatory Visit (INDEPENDENT_AMBULATORY_CARE_PROVIDER_SITE_OTHER): Payer: PPO | Admitting: Family Medicine

## 2022-01-11 ENCOUNTER — Encounter: Payer: Self-pay | Admitting: Family Medicine

## 2022-01-11 VITALS — BP 110/60 | Wt 222.8 lb

## 2022-01-11 DIAGNOSIS — R5383 Other fatigue: Secondary | ICD-10-CM | POA: Diagnosis not present

## 2022-01-11 DIAGNOSIS — G4733 Obstructive sleep apnea (adult) (pediatric): Secondary | ICD-10-CM

## 2022-01-11 DIAGNOSIS — I10 Essential (primary) hypertension: Secondary | ICD-10-CM | POA: Diagnosis not present

## 2022-01-11 DIAGNOSIS — K7460 Unspecified cirrhosis of liver: Secondary | ICD-10-CM | POA: Diagnosis not present

## 2022-01-11 DIAGNOSIS — E119 Type 2 diabetes mellitus without complications: Secondary | ICD-10-CM

## 2022-01-11 DIAGNOSIS — R188 Other ascites: Secondary | ICD-10-CM | POA: Diagnosis not present

## 2022-01-11 LAB — CULTURE, BODY FLUID W GRAM STAIN -BOTTLE: Culture: NO GROWTH

## 2022-01-11 NOTE — Progress Notes (Signed)
   Subjective:    Patient ID: Shawn Greaves., male    DOB: Jan 28, 1956, 66 y.o.   MRN: [id number]  HPI Pt arrives for follow up. Pt states things are going about as well as expected. Pt states sugars have been doing well. Blood pressure doing well also.  Type 2 diabetes mellitus without complication, without long-term current use of insulin (HCC) Type 2 diabetes mellitus without complication, without long-term current use of insulin (HCC)  Essential hypertension, benign  Obstructive sleep apnea  Cirrhosis, non-alcoholic (HCC)  Cirrhosis of liver with ascites, unspecified hepatic cirrhosis type ([place])  Other fatigue  Morbid obesity ([place])  Patient with severe ascites end-stage Being followed by GI Apparently going to have a shunt put in in the near future Review of Systems     Objective:   Physical Exam  Patient was here ascites Also swelling in the ankles Lungs clear heart regular Blood pressure stable      Assessment & Plan:  Long-term prognosis guarded Followed by GI Rest of his chronic health issues stable Difficult to know what medicines he is taking patient cannot fully know He was encouraged on his next visit to bring all of his medicines so we could reconcile his med list He denies being depressed but does state it sometimes gets to him Follow-up 3 months

## 2022-01-11 NOTE — Telephone Encounter (Signed)
See telephone note for 01/10/22

## 2022-01-13 ENCOUNTER — Ambulatory Visit (HOSPITAL_COMMUNITY)
Admission: RE | Admit: 2022-01-13 | Discharge: 2022-01-13 | Disposition: A | Discharge status: Home / Self Care | Payer: PPO | Source: Ambulatory Visit | Attending: Gastroenterology | Admitting: Gastroenterology

## 2022-01-13 ENCOUNTER — Ambulatory Visit
Admission: RE | Admit: 2022-01-13 | Discharge: 2022-01-13 | Disposition: A | Discharge status: Home / Self Care | Payer: PPO | Source: Ambulatory Visit | Attending: Interventional Radiology | Admitting: Interventional Radiology

## 2022-01-13 ENCOUNTER — Encounter (HOSPITAL_COMMUNITY): Payer: Self-pay

## 2022-01-13 DIAGNOSIS — K7581 Nonalcoholic steatohepatitis (NASH): Secondary | ICD-10-CM

## 2022-01-13 DIAGNOSIS — K766 Portal hypertension: Secondary | ICD-10-CM | POA: Diagnosis not present

## 2022-01-13 DIAGNOSIS — N289 Disorder of kidney and ureter, unspecified: Secondary | ICD-10-CM | POA: Diagnosis not present

## 2022-01-13 DIAGNOSIS — R188 Other ascites: Secondary | ICD-10-CM | POA: Diagnosis not present

## 2022-01-13 HISTORY — PX: IR RADIOLOGIST EVAL & MGMT: IMG5224

## 2022-01-13 LAB — BODY FLUID CELL COUNT WITH DIFFERENTIAL
Lymphs, Fluid: 69 %
Monocyte-Macrophage-Serous Fluid: 19 % — ABNORMAL LOW (ref 50–90)
Neutrophil Count, Fluid: 12 % (ref 0–25)
Total Nucleated Cell Count, Fluid: 149 cu mm (ref 0–1000)

## 2022-01-13 LAB — GRAM STAIN

## 2022-01-13 MED ORDER — ALBUMIN HUMAN 25 % IV SOLN: 0.0000 g | Freq: Once | INTRAVENOUS | Status: AC

## 2022-01-13 MED ORDER — ALBUMIN HUMAN 25 % IV SOLN
INTRAVENOUS | Status: AC
  Administered 2022-01-13: 25 g via INTRAVENOUS
  Filled 2022-01-13: qty 100

## 2022-01-13 MED ORDER — SODIUM CHLORIDE FLUSH 0.9 % IV SOLN
INTRAVENOUS | Status: AC
  Administered 2022-01-13: 10 mL
  Filled 2022-01-13: qty 10

## 2022-01-13 NOTE — Progress Notes (Signed)
Left sided paracentesis procedure and 25G of Albumin tolerated well today and 4L of yellow ascites removed with labs sent for processing. PT verbalized understanding of discharge instructions and ambulatory at departure with no acute distress noted.
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
## 2022-01-13 NOTE — Procedures (Signed)
    US guided LLQ paracentesis  4 liters max per MD Cloudy yellow fluid Sent for labs per MD  Tolerated well  EBL: scant

## 2022-01-14 NOTE — Telephone Encounter (Signed)
Per IR note, requesting increased "TIPS" frequency but likely meaning paracentesis.   Standing order for biweekly US paracentesis.   Limit to 4 liters. Give iv albumin 25%, 25 grams each paracentesis.  Send fluid for cell count with diff, anaerobic/aerobic cultures.  Order to expire in one month.

## 2022-01-14 NOTE — Telephone Encounter (Signed)
Pt called stating that the provider in [place] that is going to do the tips procedure told him that having the fluid drawn off once a week is not enough that we need to set him up to have it drawn off twice a week. Pt is requesting that this be arranged so that he can start having it done twice a week next week.

## 2022-01-17 ENCOUNTER — Other Ambulatory Visit (INDEPENDENT_AMBULATORY_CARE_PROVIDER_SITE_OTHER): Payer: Self-pay | Admitting: *Deleted

## 2022-01-17 DIAGNOSIS — R188 Other ascites: Secondary | ICD-10-CM

## 2022-01-17 NOTE — Addendum Note (Signed)
Addended by: Cheron Every on: 01/17/2022 08:34 AM   Modules accepted: Orders

## 2022-01-17 NOTE — Telephone Encounter (Signed)
Standing orders placed

## 2022-01-18 ENCOUNTER — Ambulatory Visit (HOSPITAL_COMMUNITY)
Admission: RE | Admit: 2022-01-18 | Discharge: 2022-01-18 | Disposition: A | Discharge status: Home / Self Care | Payer: PPO | Source: Ambulatory Visit | Attending: Gastroenterology | Admitting: Gastroenterology

## 2022-01-18 ENCOUNTER — Encounter (HOSPITAL_COMMUNITY): Payer: Self-pay

## 2022-01-18 VITALS — BP 127/91 | HR 98 | Temp 98.1°F | Resp 20

## 2022-01-18 DIAGNOSIS — R188 Other ascites: Secondary | ICD-10-CM | POA: Insufficient documentation

## 2022-01-18 DIAGNOSIS — K7460 Unspecified cirrhosis of liver: Secondary | ICD-10-CM | POA: Diagnosis not present

## 2022-01-18 LAB — GRAM STAIN

## 2022-01-18 LAB — BODY FLUID CELL COUNT WITH DIFFERENTIAL
Eos, Fluid: 0 %
Lymphs, Fluid: 33 %
Monocyte-Macrophage-Serous Fluid: 45 % — ABNORMAL LOW (ref 50–90)
Neutrophil Count, Fluid: 22 % (ref 0–25)
Total Nucleated Cell Count, Fluid: 241 cu mm (ref 0–1000)

## 2022-01-18 LAB — CULTURE, BODY FLUID W GRAM STAIN -BOTTLE: Culture: NO GROWTH

## 2022-01-18 MED ORDER — ALBUMIN HUMAN 25 % IV SOLN
0.0000 g | Freq: Once | INTRAVENOUS | Status: AC
  Filled 2022-01-18: qty 400

## 2022-01-18 MED ORDER — SODIUM CHLORIDE FLUSH 0.9 % IV SOLN
INTRAVENOUS | Status: AC
  Administered 2022-01-18: 10 mL
  Filled 2022-01-18: qty 10

## 2022-01-18 MED ORDER — ALBUMIN HUMAN 25 % IV SOLN
INTRAVENOUS | Status: AC
  Administered 2022-01-18: 25 g via INTRAVENOUS
  Filled 2022-01-18: qty 100

## 2022-01-18 NOTE — Procedures (Signed)
PreOperative Dx: Cirrhosis, ascites Postoperative Dx: Cirrhosis, ascites Procedure:   US guided paracentesis Radiologist:  Thornton Papas Anesthesia:  10 ml of1% lidocaine Specimen:  4 L of sl cloudy light yellow ascitic fluid EBL:   < 1 ml Complications:  None

## 2022-01-18 NOTE — Progress Notes (Signed)
Patient tolerated right sided paracentesis procedure and 25G of Albumin tolerated well today and 4L of yellow ascites removed with labs sent for processing. PT verbalized understanding of discharge instructions and ambulatory at departure with no acute distress noted.

## 2022-01-20 ENCOUNTER — Telehealth: Payer: Self-pay

## 2022-01-20 ENCOUNTER — Ambulatory Visit (HOSPITAL_COMMUNITY)
Admission: RE | Admit: 2022-01-20 | Discharge: 2022-01-20 | Disposition: A | Discharge status: Home / Self Care | Payer: PPO | Source: Ambulatory Visit | Attending: Gastroenterology | Admitting: Gastroenterology

## 2022-01-20 DIAGNOSIS — K766 Portal hypertension: Secondary | ICD-10-CM | POA: Diagnosis not present

## 2022-01-20 DIAGNOSIS — R188 Other ascites: Secondary | ICD-10-CM | POA: Insufficient documentation

## 2022-01-20 LAB — BODY FLUID CELL COUNT WITH DIFFERENTIAL
Lymphs, Fluid: 70 %
Monocyte-Macrophage-Serous Fluid: 16 % — ABNORMAL LOW (ref 50–90)
Neutrophil Count, Fluid: 14 % (ref 0–25)
Total Nucleated Cell Count, Fluid: 255 cu mm (ref 0–1000)

## 2022-01-20 LAB — GRAM STAIN

## 2022-01-20 LAB — PATHOLOGIST SMEAR REVIEW

## 2022-01-20 MED ORDER — ALBUMIN HUMAN 25 % IV SOLN
25.0000 g | Freq: Once | INTRAVENOUS | Status: AC
  Administered 2022-01-20: 25 g via INTRAVENOUS

## 2022-01-20 MED ORDER — ALBUMIN HUMAN 25 % IV SOLN
INTRAVENOUS | Status: AC
  Filled 2022-01-20: qty 100

## 2022-01-20 NOTE — Procedures (Signed)
   US guided RLQ paracentesis  4 Liters max per MD  Cloudy yellow fluid Sent for labs per MD Tolerated well  EBL: none

## 2022-01-20 NOTE — Telephone Encounter (Signed)
Discussed with Tammy. Per Tammy, patient denies fever, abdominal pain. Advised to go to the ED if abdominal pain, fever. Cell count with diff not c/w SBP. ?contaminant on gram stain. Discussed with Dr. Abbey Chatters, we will have patient go for diagnostic tap tomorrow for repeat cell count, gram stain, and culture.  Please arrange for diagnostic US guided paracentesis and send fluid for cell count with diff, gram stain and aerobic/anaerobic cultures. Only draw enough fluid for testing. Needs to be done 01/21/22.

## 2022-01-20 NOTE — Progress Notes (Signed)
Albumin 25g infused over ~30 min with no complications. Pt DC'd at lobby, no adverse reactions noted.

## 2022-01-20 NOTE — Telephone Encounter (Signed)
[facility] called and stated that the initial gram stain from the body fluid that was drawn today showed gram positive cocci and gram negative rods.

## 2022-01-21 ENCOUNTER — Other Ambulatory Visit: Payer: Self-pay | Admitting: *Deleted

## 2022-01-21 ENCOUNTER — Encounter (HOSPITAL_COMMUNITY): Payer: Self-pay

## 2022-01-21 ENCOUNTER — Ambulatory Visit (HOSPITAL_COMMUNITY)
Admission: RE | Admit: 2022-01-21 | Discharge: 2022-01-21 | Disposition: A | Discharge status: Home / Self Care | Payer: PPO | Source: Ambulatory Visit | Attending: Gastroenterology | Admitting: Gastroenterology

## 2022-01-21 ENCOUNTER — Telehealth: Payer: Self-pay

## 2022-01-21 VITALS — BP 97/74 | HR 76 | Temp 98.0°F | Resp 20

## 2022-01-21 DIAGNOSIS — R188 Other ascites: Secondary | ICD-10-CM

## 2022-01-21 DIAGNOSIS — K7460 Unspecified cirrhosis of liver: Secondary | ICD-10-CM | POA: Diagnosis not present

## 2022-01-21 LAB — GRAM STAIN

## 2022-01-21 LAB — BODY FLUID CELL COUNT WITH DIFFERENTIAL
Eos, Fluid: 0 %
Lymphs, Fluid: 72 %
Monocyte-Macrophage-Serous Fluid: 25 % — ABNORMAL LOW (ref 50–90)
Neutrophil Count, Fluid: 3 % (ref 0–25)
Total Nucleated Cell Count, Fluid: 291 cu mm (ref 0–1000)

## 2022-01-21 NOTE — Procedures (Signed)
PreOperative Dx: Cirrhosis, ascites Postoperative Dx: Cirrhosis, ascites Procedure:   US guided diagnostic paracentesis Radiologist:  Thornton Papas Anesthesia:  10 ml of1% lidocaine Specimen:  180 mL of cloudy yellow ascitic fluid EBL:   < 1 ml Complications:  None

## 2022-01-21 NOTE — Telephone Encounter (Signed)
Pt informed of procedure time , arrive at 10:30 am for 11 am appointment today. Verbalized understanding.

## 2022-01-21 NOTE — Telephone Encounter (Signed)
Rob from [place] called stating that there was gram neg rods from the aerobic fluids.

## 2022-01-21 NOTE — Progress Notes (Signed)
Paracentesis complete no signs of distress.  

## 2022-01-22 ENCOUNTER — Telehealth: Payer: Self-pay | Admitting: Internal Medicine

## 2022-01-22 LAB — CULTURE, BODY FLUID W GRAM STAIN -BOTTLE

## 2022-01-22 NOTE — ED Notes (Signed)
Corrected lab report called to this RN and reported to Dr. Roderic Palau on 01/22/22 at 1345.   Originally reported as gram negative rods in a bodily fluid culture. Corrected report is gram positive cocci with organism of K[place]orei.

## 2022-01-22 NOTE — Telephone Encounter (Signed)
Micro lab called;  culture from 11/14 actually growing out a coagulase negative gram positive Kocuria species (part of normal skin flora (no gram negative bugs present).  No susceptibility studies on this species.  This is even more consistent with contamination and not infection.

## 2022-01-23 NOTE — Telephone Encounter (Signed)
See result note.  

## 2022-01-24 ENCOUNTER — Encounter (HOSPITAL_COMMUNITY): Payer: Self-pay

## 2022-01-24 ENCOUNTER — Ambulatory Visit (HOSPITAL_COMMUNITY): Payer: PPO

## 2022-01-24 ENCOUNTER — Ambulatory Visit (HOSPITAL_COMMUNITY)
Admission: RE | Admit: 2022-01-24 | Discharge: 2022-01-24 | Disposition: A | Discharge status: Home / Self Care | Payer: PPO | Source: Ambulatory Visit | Attending: Gastroenterology | Admitting: Gastroenterology

## 2022-01-24 DIAGNOSIS — E440 Moderate protein-calorie malnutrition: Secondary | ICD-10-CM | POA: Diagnosis not present

## 2022-01-24 DIAGNOSIS — R188 Other ascites: Secondary | ICD-10-CM | POA: Insufficient documentation

## 2022-01-24 DIAGNOSIS — K7682 Hepatic encephalopathy: Secondary | ICD-10-CM | POA: Diagnosis not present

## 2022-01-24 DIAGNOSIS — K7460 Unspecified cirrhosis of liver: Secondary | ICD-10-CM | POA: Diagnosis not present

## 2022-01-24 LAB — GRAM STAIN

## 2022-01-24 MED ORDER — ALBUMIN HUMAN 25 % IV SOLN
0.0000 g | Freq: Once | INTRAVENOUS | Status: DC
  Filled 2022-01-24: qty 400

## 2022-01-24 MED ORDER — ALBUMIN HUMAN 25 % IV SOLN
INTRAVENOUS | Status: AC
  Filled 2022-01-24: qty 100

## 2022-01-24 NOTE — Telephone Encounter (Signed)
Noted  

## 2022-01-24 NOTE — Progress Notes (Signed)
Paracentesis complete no signs of distress.  

## 2022-01-24 NOTE — Procedures (Signed)
PreOperative Dx: Cirrhosis, ascites Postoperative Dx: Cirrhosis, ascites Procedure:   US guided paracentesis Radiologist:  Thornton Papas Anesthesia:  10 ml of1% lidocaine Specimen:  4.0 L of slight cloudy yellow ascitic fluid EBL:   < 1 ml Complications:  None

## 2022-01-25 ENCOUNTER — Ambulatory Visit: Payer: PPO | Admitting: Internal Medicine

## 2022-01-25 ENCOUNTER — Encounter: Payer: Self-pay | Admitting: Internal Medicine

## 2022-01-25 ENCOUNTER — Ambulatory Visit (INDEPENDENT_AMBULATORY_CARE_PROVIDER_SITE_OTHER): Payer: PPO | Admitting: Internal Medicine

## 2022-01-25 VITALS — BP 109/74 | HR 74 | Temp 97.4°F | Ht 65.0 in | Wt 209.0 lb

## 2022-01-25 DIAGNOSIS — K7460 Unspecified cirrhosis of liver: Secondary | ICD-10-CM | POA: Diagnosis not present

## 2022-01-25 DIAGNOSIS — R188 Other ascites: Secondary | ICD-10-CM | POA: Diagnosis not present

## 2022-01-25 DIAGNOSIS — N179 Acute kidney failure, unspecified: Secondary | ICD-10-CM | POA: Diagnosis not present

## 2022-01-25 DIAGNOSIS — K219 Gastro-esophageal reflux disease without esophagitis: Secondary | ICD-10-CM | POA: Diagnosis not present

## 2022-01-25 DIAGNOSIS — K760 Fatty (change of) liver, not elsewhere classified: Secondary | ICD-10-CM | POA: Diagnosis not present

## 2022-01-25 LAB — BODY FLUID CELL COUNT WITH DIFFERENTIAL
Eos, Fluid: 0 %
Lymphs, Fluid: 66 %
Monocyte-Macrophage-Serous Fluid: 20 % — ABNORMAL LOW (ref 50–90)
Neutrophil Count, Fluid: 14 % (ref 0–25)
Total Nucleated Cell Count, Fluid: 206 cu mm (ref 0–1000)

## 2022-01-25 LAB — CULTURE, BODY FLUID W GRAM STAIN -BOTTLE: Culture: NO GROWTH

## 2022-01-25 NOTE — Telephone Encounter (Signed)
Spoke to Dr. Gala Romney. He is advising that we stop checking cultures on his taps. He is recommending check cell count with diff only with his paracentesis. And of course continue IV albumin as ordered. Can we make these changes?

## 2022-01-25 NOTE — Telephone Encounter (Signed)
I HAVE UPDATED FUTURE STANDING ORDERS.

## 2022-01-25 NOTE — Patient Instructions (Addendum)
It was good to see you again today!  As discussed, it is very important to adhere to a 2 g sodium diet.  Eating out is probably providing enough salt for the entire day in 1 meal.  Agree with taking lactulose added to your regimen.  Our goal was to have about 3 semiformed bowel movements daily to keep your thinking clear  Reviewed labs done yesterday.  Your renal function is a bit poor.   That keeps [place] from giving you diuretic medication  Keep your next appointment for a fluid tap  Hopefully you will be seen in [place] within the next 2 weeks for a TIPS consult  Office for a visit here in 4 weeks

## 2022-01-25 NOTE — Progress Notes (Signed)
Primary Care Physician:  [name], MD Primary Gastroenterologist:  Dr. Gala Romney  Pre-Procedure History & Physical: HPI:  Shawn [name]. is a 66 y.o. male here for follow-up of Shawn [name], distant history of EtOH exposure, decompensated cirrhosis.  Recent recurrent anasarca/ascites  requiring frequent paracentesis.  Last week he had 2 paracentesis.  Cell count did not meet criteria for SBP however cultures were positive for Kocuria and coagulase-negative staph species.  He has not looked infected during this time.  It was felt these were contaminants.  Saw Dawn Drazekdown at [facility] yesterday.  She felt patient was mildly encephalopathic MELD 3.0 equals 17.  Concerns about pursuing a TIPS.  MELD driven by by elevated creatinine. Oculus added to his regimen.  Screening EGD and colonoscopy recommended.  The need for frequent taps are problematic.  We are limited in mobilizing fluid with diuretics due to these meds bump in creatinine.  Patient not very adherent to a 2 g sodium diet.  Patient endorses he ate takeout [place] food last night.  His son, Shawn [name], accompanies him today.  He is waiting on an appointment for TIPS consultation down in [place].  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   Hyperlipidemia    Hypertension    whitecoat   Obstructive sleep apnea    cpap   Prediabetes    Sleep apnea    TIA (transient ischemic attack)    Possible, 2007   Type 2 diabetes mellitus (HCC)    Venous stasis     Past Surgical  History:  Procedure Laterality Date   ANTERIOR CERVICAL DECOMP/DISCECTOMY FUSION N/A 12/30/2015   Procedure: ANTERIOR CERVICAL DECOMPRESSION/DISCECTOMY FUSION C6 - C7 1 LEVEL;  Surgeon: Melina Schools, MD;  Location: [facility];  Service: Orthopedics;  Laterality: N/A;   CARDIAC CATHETERIZATION  01/2012   negative   CARPAL TUNNEL RELEASE     CERVICAL SPINE NERVE BLOCK  05/17/2017   IR RADIOLOGIST EVAL & MGMT  12/03/2021   IR RADIOLOGIST EVAL & MGMT  01/13/2022   KNEE SURGERY Right    NECK SURGERY     VASECTOMY      Prior to Admission medications   Medication Sig Start Date End Date Taking? Authorizing Provider  amLODipine (NORVASC) 10 MG tablet    Yes [provider]  amLODipine (NORVASC) 2.5 MG tablet Take 1 tablet (2.5 mg total) by mouth daily. 11/29/21  Yes [name], MD  ascorbic acid (VITAMIN C) 500 MG tablet Take 500 mg by mouth daily.   Yes [provider]  blood glucose meter kit and supplies KIT Dispense based on patient and insurance preference. Use daily as directed. (FOR ICD - 10 E11.9) 05/02/14  Yes [name], MD  blood glucose meter kit and supplies Dispense based on patient and insurance preference. Use to check sugars once per day. Dx.E11.9 06/30/21  Yes [name], MD  buPROPion (WELLBUTRIN XL) 300 MG  24 hr tablet    Yes [provider]  canagliflozin (INVOKANA) 100 MG TABS tablet Take 1 tablet (100 mg total) by mouth daily before breakfast. 06/30/21  Yes Luking, Elayne Snare, MD  Cholecalciferol 50 MCG (2000 UT) TABS Take 1 tablet by mouth daily.   Yes [provider]  clonazePAM (KLONOPIN) 0.5 MG tablet 1 pill taken 2 or 3 times a day for essential tremor 11/01/21  Yes Luking, Scott A, MD  Cyanocobalamin (B-12 PO) Take 1 tablet by mouth daily. 500 mg daily   Yes [provider]  dorzolamide (TRUSOPT) 2 % ophthalmic solution 1 drop 2 (two) times daily. 10/29/21  Yes [provider]  fluticasone (FLONASE) 50 MCG/ACT  nasal spray Place 2 sprays into both nostrils daily. 11/29/21  Yes Luking, Elayne Snare, MD  gabapentin (NEURONTIN) 100 MG capsule Take 1 capsule 3 times a day by oral route.   Yes [provider]  glucose blood (ONETOUCH VERIO) test strip USE AS INSTRUCTED TO TEST BLOOD SUGAR DAILY 03/30/20  Yes [name], MD  hydrochlorothiazide (HYDRODIURIL) 12.5 MG tablet    Yes [provider]  lactulose (Weingarten) 10 GM/15ML solution Take by mouth. 01/24/22 01/24/23 Yes [provider]  Lancets (ONETOUCH DELICA PLUS [id number]) [place] USE TO TEST BLOOD SUGAR DAILY 10/11/21  Yes [name], MD  lisinopril (ZESTRIL) 40 MG tablet    Yes [provider]  metFORMIN (GLUCOPHAGE) 500 MG tablet 1 qd 11/01/21  Yes Luking, Scott A, MD  metoprolol succinate (TOPROL-XL) 25 MG 24 hr tablet Take 1 tablet (25 mg total) by mouth daily. with food 11/01/21  Yes Luking, Elayne Snare, MD  Milk Thistle 1000 MG CAPS Take by mouth.   Yes [provider]  Omega-3 Fatty Acids (FISH OIL) 1000 MG CAPS Take 1 capsule by mouth in the morning, at noon, and at bedtime.   Yes [provider]  potassium chloride SA (KLOR-CON M20) 20 MEQ tablet TAKE 1 TABLET BY MOUTH EVERY DAY 11/26/21  Yes Luking, Elayne Snare, MD  pravastatin (PRAVACHOL) 80 MG tablet    Yes [provider]  rosuvastatin (CRESTOR) 20 MG tablet Take 1 tablet (20 mg total) by mouth daily. 12/30/20  Yes [name], MD  venlafaxine XR (EFFEXOR-XR) 150 MG 24 hr capsule Take 1 capsule (150 mg total) by mouth 2 (two) times daily. 11/01/21  Yes [name], MD  vitamin E 180 MG (400 UNITS) capsule Take by mouth.   Yes [provider]  zinc gluconate 50 MG tablet Take 50 mg by mouth daily.   Yes [provider]  diazepam (VALIUM) 10 MG tablet     [provider]  pantoprazole (PROTONIX) 40 MG tablet Take 1 tablet (40 mg total) by mouth daily. Patient not taking: Reported on 01/25/2022 11/01/21    [name], MD    Allergies as of 01/25/2022   (No Known Allergies)    Family History  Problem Relation Age of Onset   Colon cancer Mother    Hypertension Mother    CAD Mother    Breast cancer Mother    Cirrhosis Brother     Social History   Socioeconomic History   Marital status: Married    Spouse name: Not on file   Number of children: 4   Years of education: Not on file   Highest education level: Not on file  Occupational History   Occupation: maintainer   Tobacco Use   Smoking  status: Never   Smokeless tobacco: Current    Types: Snuff   Tobacco comments:    Uses dip pouches occasionally  Vaping Use   Vaping Use: Never used  Substance and Sexual Activity   Alcohol use: No    Alcohol/week: 0.0 standard drinks of alcohol    Comment: Previously 1-2 cases of beer/week. patient states never on regular basis though. last etoh in 2013.    Drug use: No   Sexual activity: Not Currently  Other Topics Concern   Not on file  Social History Narrative   ** Merged History Encounter **       Social Determinants of Health   Financial Resource Strain: Low Risk  (01/04/2022)   Overall Financial Resource Strain (CARDIA)    Difficulty of Paying Living Expenses: Not hard at all  Food Insecurity: No Food Insecurity (01/04/2022)   Hunger Vital Sign    Worried About Running Out of Food in the Last Year: Never true    Ran Out of Food in the Last Year: Never true  Transportation Needs: No Transportation Needs (01/04/2022)   PRAPARE - Hydrologist (Medical): No    Lack of Transportation (Non-Medical): No  Physical Activity: Inactive (01/04/2022)   Exercise Vital Sign    Days of Exercise per Week: 0 days    Minutes of Exercise per Session: 0 min  Stress: No Stress Concern Present (01/04/2022)   [place]    Feeling of Stress : Only a little  Social Connections: Moderately  Integrated (01/04/2022)   Social Connection and Isolation Panel [NHANES]    Frequency of Communication with Friends and Family: More than three times a week    Frequency of Social Gatherings with Friends and Family: Once a week    Attends Religious Services: More than 4 times per year    Active Member of [employer] or Organizations: No    Attends Archivist Meetings: Never    Marital Status: Married  Human resources officer Violence: Not At Risk (01/04/2022)   Humiliation, Afraid, Rape, and Kick questionnaire    Fear of Current or Ex-Partner: No    Emotionally Abused: No    Physically Abused: No    Sexually Abused: No    Review of Systems: See HPI, otherwise negative ROS  Physical Exam: BP 109/74 (BP Location: Right Arm, Patient Position: Sitting, Cuff Size: Large)   Pulse 74   Temp (!) 97.4 F (36.3 C) (Oral)   Ht _0  (1.651 m)   Wt 209 lb (94.8 kg)   SpO2 98%   BMI 34.78 kg/m  General:   Alert, conversant oriented.  No asterixis.  He is accompanied by his son Shawn [name] Neck:  Supple; no masses or thyromegaly. No significant cervical adenopathy. Lungs:  Clear throughout to auscultation.   No wheezes, crackles, or rhonchi. No acute distress. Heart:  Regular rate and rhythm; no murmurs, clicks, rubs,  or gallops. Abdomen: Distended.  Positive fluid wave and shifting dullness nontender.  Obvious mass. Extremities 2+ LE edema LE edema.   Impression/Plan: 66 year old male decompensated with a history of NASH/possibly component of ASH (distant insult) cirrhosis recurrent anasarca and ascites requiring frequent taps. I feel patient does not compliant with a 2 g sodium diet.  This, in part, is making fluid management more difficult. Agree with adding lactulose to his regimen. Recent fluid analysis revealed concerns for infection based on culture criteria.  However, clinically, he is  not sick and has cell counts not consistent with SBP. Culture grew bacteria commonly associated with skin  flora. He ought to get an EGD and a colonoscopy at some point.  I do believe he should follow through on TIPS consultation.  Even though there is a potential for further decompensation, I see no end to frequent paracentesis.  Risk of morbidity increases over time with this approach. I do not detect overt hepatic encephalopathy today.  He has no flap.  His son, Shawn [name], has not noticed changes recently.  Is a history of mild cognitive decline which may or may not be related to hepatic encephalopathy. At this time, I would deem him a marginal to somewhat poor transplant candidate.   Recommendations:  Hepatoma screening every 6 months  Adherence to 2 g sodium diet.  Information provided  Follow through on TIPS consultation.  Goal with lactulose is 3 semiformed stools daily  Will plan for an EGD and a colonoscopy likely the first of the year depending on clinical progress (post TIPS).  Overall clinical status at this time is guarded.   Notice: This dictation was prepared with Dragon dictation along with smaller phrase technology. Any transcriptional errors that result from this process are unintentional and may not be corrected upon review.

## 2022-01-25 NOTE — Addendum Note (Signed)
Addended by: Cheron Every on: 01/25/2022 01:09 PM   Modules accepted: Orders

## 2022-01-25 NOTE — Addendum Note (Signed)
Addended by: Cheron Every on: 01/25/2022 01:20 PM   Modules accepted: Orders

## 2022-01-26 LAB — AEROBIC/ANAEROBIC CULTURE W GRAM STAIN (SURGICAL/DEEP WOUND)
Culture: NO GROWTH
Gram Stain: NONE SEEN

## 2022-01-28 ENCOUNTER — Encounter (HOSPITAL_COMMUNITY): Payer: Self-pay

## 2022-01-28 ENCOUNTER — Ambulatory Visit (HOSPITAL_COMMUNITY)
Admission: RE | Admit: 2022-01-28 | Discharge: 2022-01-28 | Disposition: A | Discharge status: Home / Self Care | Payer: PPO | Source: Ambulatory Visit | Attending: Gastroenterology | Admitting: Gastroenterology

## 2022-01-28 ENCOUNTER — Ambulatory Visit (HOSPITAL_COMMUNITY): Payer: PPO

## 2022-01-28 DIAGNOSIS — R188 Other ascites: Secondary | ICD-10-CM | POA: Insufficient documentation

## 2022-01-28 DIAGNOSIS — K7460 Unspecified cirrhosis of liver: Secondary | ICD-10-CM | POA: Diagnosis not present

## 2022-01-28 LAB — BODY FLUID CELL COUNT WITH DIFFERENTIAL
Eos, Fluid: 0 %
Lymphs, Fluid: 30 %
Monocyte-Macrophage-Serous Fluid: 52 % (ref 50–90)
Neutrophil Count, Fluid: 18 % (ref 0–25)
Total Nucleated Cell Count, Fluid: 231 cu mm (ref 0–1000)

## 2022-01-28 LAB — GRAM STAIN

## 2022-01-28 MED ORDER — ALBUMIN HUMAN 25 % IV SOLN: 25.0000 g | Freq: Once | INTRAVENOUS | Status: AC

## 2022-01-28 MED ORDER — ALBUMIN HUMAN 25 % IV SOLN
INTRAVENOUS | Status: AC
  Administered 2022-01-28: 25 g via INTRAVENOUS
  Filled 2022-01-28: qty 100

## 2022-01-28 NOTE — Progress Notes (Signed)
Paracentesis complete no signs of distress, 25G Albumin infused.

## 2022-01-28 NOTE — Procedures (Signed)
PreOperative Dx: Cirrhosis, ascites Postoperative Dx: Cirrhosis, ascites Procedure:   US guided paracentesis Radiologist:  Thornton Papas Anesthesia:  10 ml of1% lidocaine Specimen:  4 L of CLOUDY YELLOW ascitic fluid EBL:   < 1 ml Complications:  None

## 2022-01-29 LAB — CULTURE, BODY FLUID W GRAM STAIN -BOTTLE: Culture: NO GROWTH

## 2022-01-31 ENCOUNTER — Ambulatory Visit (HOSPITAL_COMMUNITY)
Admission: RE | Admit: 2022-01-31 | Discharge: 2022-01-31 | Disposition: A | Discharge status: Home / Self Care | Payer: PPO | Source: Ambulatory Visit | Attending: Gastroenterology | Admitting: Gastroenterology

## 2022-01-31 DIAGNOSIS — R188 Other ascites: Secondary | ICD-10-CM | POA: Diagnosis not present

## 2022-01-31 LAB — BODY FLUID CELL COUNT WITH DIFFERENTIAL
Lymphs, Fluid: 29 %
Monocyte-Macrophage-Serous Fluid: 58 % (ref 50–90)
Neutrophil Count, Fluid: 13 % (ref 0–25)
Total Nucleated Cell Count, Fluid: 252 cu mm (ref 0–1000)

## 2022-01-31 LAB — GRAM STAIN

## 2022-01-31 MED ORDER — ALBUMIN HUMAN 25 % IV SOLN
INTRAVENOUS | Status: AC
  Administered 2022-01-31: 25 g via INTRAVENOUS
  Filled 2022-01-31: qty 100

## 2022-01-31 MED ORDER — ALBUMIN HUMAN 25 % IV SOLN: 25.0000 g | Freq: Once | INTRAVENOUS | Status: AC

## 2022-01-31 NOTE — Progress Notes (Signed)
Paracentesis complete no signs of distress.  

## 2022-02-01 ENCOUNTER — Other Ambulatory Visit (HOSPITAL_COMMUNITY): Payer: Self-pay | Admitting: Interventional Radiology

## 2022-02-01 DIAGNOSIS — K7460 Unspecified cirrhosis of liver: Secondary | ICD-10-CM

## 2022-02-02 ENCOUNTER — Encounter (HOSPITAL_COMMUNITY): Payer: Self-pay

## 2022-02-02 ENCOUNTER — Telehealth (HOSPITAL_COMMUNITY): Payer: Self-pay | Admitting: Radiology

## 2022-02-02 LAB — CULTURE, BODY FLUID W GRAM STAIN -BOTTLE
Culture: NO GROWTH
Special Requests: ADEQUATE

## 2022-02-02 NOTE — Telephone Encounter (Signed)
Called pt to schedule his TIPS procedure with Dr. Serafina Royals on 02/14/22 at 11:30 am. Left VM for patient to call me back. JM

## 2022-02-03 ENCOUNTER — Ambulatory Visit (HOSPITAL_COMMUNITY)
Admission: RE | Admit: 2022-02-03 | Discharge: 2022-02-03 | Disposition: A | Discharge status: Home / Self Care | Payer: PPO | Source: Ambulatory Visit | Attending: Gastroenterology | Admitting: Gastroenterology

## 2022-02-03 DIAGNOSIS — K766 Portal hypertension: Secondary | ICD-10-CM | POA: Diagnosis not present

## 2022-02-03 DIAGNOSIS — R188 Other ascites: Secondary | ICD-10-CM | POA: Diagnosis not present

## 2022-02-03 LAB — BODY FLUID CELL COUNT WITH DIFFERENTIAL
Eos, Fluid: 0 %
Lymphs, Fluid: 76 %
Monocyte-Macrophage-Serous Fluid: 7 % — ABNORMAL LOW (ref 50–90)
Neutrophil Count, Fluid: 17 % (ref 0–25)
Total Nucleated Cell Count, Fluid: 279 cu mm (ref 0–1000)

## 2022-02-03 LAB — GRAM STAIN

## 2022-02-03 MED ORDER — ALBUMIN HUMAN 25 % IV SOLN: 25.0000 g | Freq: Once | INTRAVENOUS | Status: AC

## 2022-02-03 MED ORDER — ALBUMIN HUMAN 25 % IV SOLN
INTRAVENOUS | Status: AC
  Administered 2022-02-03: 25 g via INTRAVENOUS
  Filled 2022-02-03: qty 100

## 2022-02-03 MED ORDER — LIDOCAINE HCL (PF) 2 % IJ SOLN
INTRAMUSCULAR | Status: AC
  Filled 2022-02-03: qty 30

## 2022-02-03 NOTE — Procedures (Addendum)
   US guided RLQ paracentesis  4 L (max) cloudy yellow fluid Sent for labs per MD  Tolerated well  EBL: none

## 2022-02-03 NOTE — Progress Notes (Signed)
Paracentesis complete no signs of distress.  

## 2022-02-04 ENCOUNTER — Telehealth: Payer: Self-pay | Admitting: *Deleted

## 2022-02-04 NOTE — Pre-Procedure Instructions (Addendum)
Surgical Instructions    Your procedure is scheduled on February 14, 2022.  Report to [facility] Main Entrance "A" at 10:25 A.M., then check in with the Admitting office.  Call this number if you have problems the morning of surgery:  [phone]   If you have any questions prior to your surgery date call [phone]: Open Monday-Friday 8am-4pm If you experience any cold or flu symptoms such as cough, fever, chills, shortness of breath, etc. between now and your scheduled surgery, please notify us at the above number     Remember:  Do not eat or drink after midnight the night before your surgery    Take these medicines the morning of surgery with A SIP OF WATER:  amLODipine (NORVASC)   clonazePAM (KLONOPIN)  dorzolamide (TRUSOPT) ophthalmic solution   fluticasone (FLONASE)   gabapentin (NEURONTIN)   metoprolol succinate (TOPROL-XL)   rosuvastatin (CRESTOR)   venlafaxine XR (EFFEXOR-XR)     As of today, STOP taking any Aspirin (unless otherwise instructed by your surgeon) Aleve, Naproxen, Ibuprofen, Motrin, Advil, Goody's, BC's, all herbal medications, fish oil, and all vitamins.   WHAT DO I DO ABOUT MY DIABETES MEDICATION?   Do not take metFORMIN (GLUCOPHAGE) the morning of surgery.  STOP taking your canagliflozin [facility]) three days prior to your surgery. Your last dose of this medication will be December 7th.   HOW TO MANAGE YOUR DIABETES BEFORE AND AFTER SURGERY  Why is it important to control my blood sugar before and after surgery? Improving blood sugar levels before and after surgery helps healing and can limit problems. A way of improving blood sugar control is eating a healthy diet by:  Eating less sugar and carbohydrates  Increasing activity/exercise  Talking with your doctor about reaching your blood sugar goals High blood sugars (greater than 180 mg/dL) can raise your risk of infections and slow your recovery, so you will need to focus on controlling  your diabetes during the weeks before surgery. Make sure that the doctor who takes care of your diabetes knows about your planned surgery including the date and location.  How do I manage my blood sugar before surgery? Check your blood sugar at least 4 times a day, starting 2 days before surgery, to make sure that the level is not too high or low.  Check your blood sugar the morning of your surgery when you wake up and every 2 hours until you get to the Short Stay unit.  If your blood sugar is less than 70 mg/dL, you will need to treat for low blood sugar: Do not take insulin. Treat a low blood sugar (less than 70 mg/dL) with  cup of clear juice (cranberry or apple), 4 glucose tablets, OR glucose gel. Recheck blood sugar in 15 minutes after treatment (to make sure it is greater than 70 mg/dL). If your blood sugar is not greater than 70 mg/dL on recheck, call [phone] for further instructions. Report your blood sugar to the short stay nurse when you get to Short Stay.  If you are admitted to the hospital after surgery: Your blood sugar will be checked by the staff and you will probably be given insulin after surgery (instead of oral diabetes medicines) to make sure you have good blood sugar levels. The goal for blood sugar control after surgery is 80-180 mg/dL.                      Do NOT Smoke (Tobacco/Vaping) for 24 hours  prior to your procedure.  If you use a CPAP at night, you may bring your mask/headgear for your overnight stay.   Contacts, glasses, piercing's, hearing aid's, dentures or partials may not be worn into surgery, please bring cases for these belongings.    For patients admitted to the hospital, discharge time will be determined by your treatment team.   Patients discharged the day of surgery will not be allowed to drive home, and someone needs to stay with them for 24 hours.  SURGICAL WAITING ROOM VISITATION Patients having surgery or a procedure may have no more  than 2 support people in the waiting area - these visitors may rotate.   Children under the age of 17 must have an adult with them who is not the patient. If the patient needs to stay at the hospital during part of their recovery, the visitor guidelines for inpatient rooms apply. Pre-op nurse will coordinate an appropriate time for 1 support person to accompany patient in pre-op.  This support person may not rotate.   Please refer to the [facility] website for the visitor guidelines for Inpatients (after your surgery is over and you are in a regular room).    Special instructions:   [facility]- Preparing For Surgery  Before surgery, you can play an important role. Because skin is not sterile, your skin needs to be as free of germs as possible. You can reduce the number of germs on your skin by washing with CHG (chlorahexidine gluconate) Soap before surgery.  CHG is an antiseptic cleaner which kills germs and bonds with the skin to continue killing germs even after washing.    Oral Hygiene is also important to reduce your risk of infection.  Remember - BRUSH YOUR TEETH THE MORNING OF SURGERY WITH YOUR REGULAR TOOTHPASTE  Please do not use if you have an allergy to CHG or antibacterial soaps. If your skin becomes reddened/irritated stop using the CHG.  Do not shave (including legs and underarms) for at least 48 hours prior to first CHG shower. It is OK to shave your face.  Please follow these instructions carefully.   Shower the NIGHT BEFORE SURGERY and the MORNING OF SURGERY  If you chose to wash your hair, wash your hair first as usual with your normal shampoo.  After you shampoo, rinse your hair and body thoroughly to remove the shampoo.  Use CHG Soap as you would any other liquid soap. You can apply CHG directly to the skin and wash gently with a scrungie or a clean washcloth.   Apply the CHG Soap to your body ONLY FROM THE NECK DOWN.  Do not use on open wounds or open sores. Avoid  contact with your eyes, ears, mouth and genitals (private parts). Wash Face and genitals (private parts)  with your normal soap.   Wash thoroughly, paying special attention to the area where your surgery will be performed.  Thoroughly rinse your body with warm water from the neck down.  DO NOT shower/wash with your normal soap after using and rinsing off the CHG Soap.  Pat yourself dry with a CLEAN TOWEL.  Wear CLEAN PAJAMAS to bed the night before surgery  Place CLEAN SHEETS on your bed the night before your surgery  DO NOT SLEEP WITH PETS.   Day of Surgery: Take a shower with CHG soap. Do not wear jewelry or makeup Do not wear lotions, powders, colognes, or deodorant. Men may shave face and neck. Do not bring valuables to  the hospital.  [facility] is not responsible for any belongings or valuables.  Wear Clean/Comfortable clothing the morning of surgery Remember to brush your teeth WITH YOUR REGULAR TOOTHPASTE.   Please read over the following fact sheets that you were given.    If you received a COVID test during your pre-op visit  it is requested that you wear a mask when out in public, stay away from anyone that may not be feeling well and notify your surgeon if you develop symptoms. If you have been in contact with anyone that has tested positive in the last 10 days please notify you surgeon.

## 2022-02-04 NOTE — Telephone Encounter (Signed)
Pt says his last paracentesis is on 02/07/22. He was told he needs new order to have another one on 02/10/22. He says he is supposed to be doing these twice a wee.

## 2022-02-05 LAB — CULTURE, BODY FLUID W GRAM STAIN -BOTTLE: Culture: NO GROWTH

## 2022-02-07 ENCOUNTER — Inpatient Hospital Stay (HOSPITAL_COMMUNITY)
Admission: RE | Admit: 2022-02-07 | Discharge: 2022-02-07 | Disposition: A | Discharge status: Home / Self Care | Payer: PPO | Source: Ambulatory Visit

## 2022-02-07 ENCOUNTER — Ambulatory Visit (HOSPITAL_COMMUNITY)
Admission: RE | Admit: 2022-02-07 | Discharge: 2022-02-07 | Disposition: A | Discharge status: Home / Self Care | Payer: PPO | Source: Ambulatory Visit | Attending: Gastroenterology | Admitting: Gastroenterology

## 2022-02-07 ENCOUNTER — Encounter (HOSPITAL_COMMUNITY): Payer: Self-pay

## 2022-02-07 DIAGNOSIS — R188 Other ascites: Secondary | ICD-10-CM | POA: Insufficient documentation

## 2022-02-07 DIAGNOSIS — K7460 Unspecified cirrhosis of liver: Secondary | ICD-10-CM | POA: Diagnosis not present

## 2022-02-07 LAB — BODY FLUID CELL COUNT WITH DIFFERENTIAL
Eos, Fluid: 0 %
Lymphs, Fluid: 51 %
Monocyte-Macrophage-Serous Fluid: 17 % — ABNORMAL LOW (ref 50–90)
Neutrophil Count, Fluid: 32 % — ABNORMAL HIGH (ref 0–25)
Total Nucleated Cell Count, Fluid: 288 cu mm (ref 0–1000)

## 2022-02-07 MED ORDER — ALBUMIN HUMAN 25 % IV SOLN: 25.0000 g | Freq: Once | INTRAVENOUS | Status: AC

## 2022-02-07 MED ORDER — ALBUMIN HUMAN 25 % IV SOLN
INTRAVENOUS | Status: AC
  Administered 2022-02-07: 25 g via INTRAVENOUS
  Filled 2022-02-07: qty 100

## 2022-02-07 NOTE — Procedures (Signed)
PreOperative Dx: Cirrhosis, ascites Postoperative Dx: Cirrhosis, ascites Procedure:   US guided paracentesis Radiologist:  Thornton Papas Anesthesia:  10 ml of1% lidocaine Specimen:  4 L of cloudy light yellow ascitic fluid EBL:   < 1 ml Complications:  None

## 2022-02-07 NOTE — Progress Notes (Signed)
Paracentesis complete no signs of distress. 4L ascites removed and 25G IV Albumin given.

## 2022-02-08 ENCOUNTER — Other Ambulatory Visit (HOSPITAL_COMMUNITY): Payer: PPO

## 2022-02-08 LAB — CULTURE, BODY FLUID W GRAM STAIN -BOTTLE
Culture: NO GROWTH
Gram Stain: NONE SEEN

## 2022-02-08 NOTE — Telephone Encounter (Signed)
Patient last seen by Dr. Gala Romney.

## 2022-02-08 NOTE — Telephone Encounter (Signed)
You can order US guided paracentesis up to two weekly for total of 8.   -Limit to 4 liters of fluid withdrawn each time. -Give IV albumin 25% 25 grams each paracentesis. -Send fluid for cell count with diff.

## 2022-02-08 NOTE — Telephone Encounter (Signed)
-----   Message -----  From: Daneil Dolin, MD  Sent: 02/07/2022   1:17 PM EST  To: Madelin Rear, LPN   I am not sure about that.  Neil Crouch has been keeping that going.  She probably knows better than I do.  Would you please ask her.  ----- Message -----  From: Madelin Rear, LPN  Sent: [phone]/[id number]/[phone]   1:14 PM EST  To: Daneil Dolin, MD   Pt says his last paracentesis is on 02/07/22. He was told he needs new order to have another one on 02/10/22. He says he is supposed to be doing these twice a week.

## 2022-02-09 ENCOUNTER — Other Ambulatory Visit: Payer: Self-pay | Admitting: *Deleted

## 2022-02-09 DIAGNOSIS — K7460 Unspecified cirrhosis of liver: Secondary | ICD-10-CM

## 2022-02-09 NOTE — Telephone Encounter (Signed)
Orders placed in epic and pt has been notified.

## 2022-02-10 ENCOUNTER — Other Ambulatory Visit: Payer: Self-pay | Admitting: Radiology

## 2022-02-10 ENCOUNTER — Ambulatory Visit (HOSPITAL_COMMUNITY)
Admission: RE | Admit: 2022-02-10 | Discharge: 2022-02-10 | Disposition: A | Discharge status: Home / Self Care | Payer: PPO | Source: Ambulatory Visit | Attending: Gastroenterology | Admitting: Gastroenterology

## 2022-02-10 DIAGNOSIS — K766 Portal hypertension: Secondary | ICD-10-CM | POA: Diagnosis not present

## 2022-02-10 DIAGNOSIS — K7460 Unspecified cirrhosis of liver: Secondary | ICD-10-CM | POA: Insufficient documentation

## 2022-02-10 DIAGNOSIS — R188 Other ascites: Secondary | ICD-10-CM | POA: Diagnosis not present

## 2022-02-10 LAB — GRAM STAIN

## 2022-02-10 LAB — BODY FLUID CELL COUNT WITH DIFFERENTIAL
Eos, Fluid: 0 %
Lymphs, Fluid: 51 %
Monocyte-Macrophage-Serous Fluid: 28 % — ABNORMAL LOW (ref 50–90)
Neutrophil Count, Fluid: 21 % (ref 0–25)
Total Nucleated Cell Count, Fluid: 275 cu mm (ref 0–1000)

## 2022-02-10 MED ORDER — ALBUMIN HUMAN 25 % IV SOLN
INTRAVENOUS | Status: AC
  Administered 2022-02-10: 25 g via INTRAVENOUS
  Filled 2022-02-10: qty 100

## 2022-02-10 MED ORDER — ALBUMIN HUMAN 25 % IV SOLN: 25.0000 g | Freq: Once | INTRAVENOUS | Status: AC

## 2022-02-10 NOTE — Progress Notes (Signed)
Paracentesis complete no signs of distress. 25 grams of IV Albumin given.

## 2022-02-10 NOTE — Procedures (Addendum)
   US guided RLQ paracentesis  4 liters (max per MD) cloudy yellow fluid Sent for labs  Tolerated well  EBL: less than 1 cc  Pt is scheduled for TIPS with Dr Serafina Royals 02/14/22

## 2022-02-11 ENCOUNTER — Other Ambulatory Visit: Payer: Self-pay | Admitting: Student

## 2022-02-11 NOTE — Progress Notes (Signed)
I called Mr. gracen southwell times, I did not get an answer, I left a message asking patient to call me back. Patient or wife would call back and leave when I was on the phone with other. I called again I left a message telling Mr. Reierson that I ad keeping my number open for 15 minutes, I did not get a call.   I called back at [phone], I left a message instructing patient to arrive Monday at 1000. NPO after midnight. I instructed patient Mr. Gerst to not take Metformin  Monday, to continue to hold Invokana.  I instructed patient to check CBG after awaking and every 2 hours until arrival  to the hospital.  I Instructed patient if CBG is less than 70 to take 4 Glucose Tablets or 1 tube of Glucose Gel or 1/2 cup of a clear juice. Recheck CBG in 15 minutes if CBG is not over 70 call, pre- op desk at [phone] for further instructions.

## 2022-02-14 ENCOUNTER — Encounter (HOSPITAL_COMMUNITY): Payer: Self-pay | Admitting: Interventional Radiology

## 2022-02-14 ENCOUNTER — Inpatient Hospital Stay (HOSPITAL_COMMUNITY)
Admission: RE | Admit: 2022-02-14 | Discharge: 2022-02-15 | DRG: 405 | Disposition: A | Discharge status: Home / Self Care | Payer: PPO | Attending: Interventional Radiology | Admitting: Interventional Radiology

## 2022-02-14 ENCOUNTER — Inpatient Hospital Stay (HOSPITAL_COMMUNITY)
Admission: RE | Admit: 2022-02-14 | Discharge: 2022-02-14 | Disposition: A | Discharge status: Home / Self Care | Payer: PPO | Source: Ambulatory Visit | Attending: Interventional Radiology | Admitting: Interventional Radiology

## 2022-02-14 ENCOUNTER — Other Ambulatory Visit: Payer: Self-pay

## 2022-02-14 ENCOUNTER — Encounter (HOSPITAL_COMMUNITY)
Admission: RE | Disposition: A | Discharge status: Home / Self Care | Payer: Self-pay | Source: Home / Self Care | Attending: Interventional Radiology

## 2022-02-14 ENCOUNTER — Inpatient Hospital Stay (HOSPITAL_COMMUNITY): Payer: PPO | Admitting: Certified Registered"

## 2022-02-14 ENCOUNTER — Other Ambulatory Visit: Payer: Self-pay | Admitting: Gastroenterology

## 2022-02-14 DIAGNOSIS — K767 Hepatorenal syndrome: Secondary | ICD-10-CM | POA: Diagnosis present

## 2022-02-14 DIAGNOSIS — N189 Chronic kidney disease, unspecified: Secondary | ICD-10-CM

## 2022-02-14 DIAGNOSIS — F418 Other specified anxiety disorders: Secondary | ICD-10-CM | POA: Diagnosis not present

## 2022-02-14 DIAGNOSIS — E1122 Type 2 diabetes mellitus with diabetic chronic kidney disease: Secondary | ICD-10-CM | POA: Diagnosis not present

## 2022-02-14 DIAGNOSIS — K7460 Unspecified cirrhosis of liver: Secondary | ICD-10-CM

## 2022-02-14 DIAGNOSIS — F32A Depression, unspecified: Secondary | ICD-10-CM | POA: Diagnosis present

## 2022-02-14 DIAGNOSIS — Z95828 Presence of other vascular implants and grafts: Secondary | ICD-10-CM

## 2022-02-14 DIAGNOSIS — E785 Hyperlipidemia, unspecified: Secondary | ICD-10-CM | POA: Diagnosis not present

## 2022-02-14 DIAGNOSIS — Z7982 Long term (current) use of aspirin: Secondary | ICD-10-CM

## 2022-02-14 DIAGNOSIS — Z79899 Other long term (current) drug therapy: Secondary | ICD-10-CM

## 2022-02-14 DIAGNOSIS — K766 Portal hypertension: Secondary | ICD-10-CM | POA: Diagnosis not present

## 2022-02-14 DIAGNOSIS — I129 Hypertensive chronic kidney disease with stage 1 through stage 4 chronic kidney disease, or unspecified chronic kidney disease: Secondary | ICD-10-CM

## 2022-02-14 DIAGNOSIS — Z800 Family history of malignant neoplasm of digestive organs: Secondary | ICD-10-CM

## 2022-02-14 DIAGNOSIS — Z8249 Family history of ischemic heart disease and other diseases of the circulatory system: Secondary | ICD-10-CM | POA: Diagnosis not present

## 2022-02-14 DIAGNOSIS — Z7984 Long term (current) use of oral hypoglycemic drugs: Secondary | ICD-10-CM

## 2022-02-14 DIAGNOSIS — F419 Anxiety disorder, unspecified: Secondary | ICD-10-CM | POA: Diagnosis not present

## 2022-02-14 DIAGNOSIS — Z8673 Personal history of transient ischemic attack (TIA), and cerebral infarction without residual deficits: Secondary | ICD-10-CM

## 2022-02-14 DIAGNOSIS — Z981 Arthrodesis status: Secondary | ICD-10-CM | POA: Diagnosis not present

## 2022-02-14 DIAGNOSIS — G4733 Obstructive sleep apnea (adult) (pediatric): Secondary | ICD-10-CM | POA: Diagnosis present

## 2022-02-14 DIAGNOSIS — Z803 Family history of malignant neoplasm of breast: Secondary | ICD-10-CM | POA: Diagnosis not present

## 2022-02-14 DIAGNOSIS — E1140 Type 2 diabetes mellitus with diabetic neuropathy, unspecified: Secondary | ICD-10-CM | POA: Diagnosis present

## 2022-02-14 DIAGNOSIS — R188 Other ascites: Secondary | ICD-10-CM | POA: Diagnosis present

## 2022-02-14 DIAGNOSIS — I10 Essential (primary) hypertension: Secondary | ICD-10-CM | POA: Diagnosis not present

## 2022-02-14 DIAGNOSIS — K7581 Nonalcoholic steatohepatitis (NASH): Principal | ICD-10-CM | POA: Diagnosis present

## 2022-02-14 DIAGNOSIS — Z720 Tobacco use: Secondary | ICD-10-CM | POA: Diagnosis not present

## 2022-02-14 HISTORY — PX: IR PARACENTESIS: IMG2679

## 2022-02-14 HISTORY — PX: RADIOLOGY WITH ANESTHESIA: SHX6223

## 2022-02-14 HISTORY — PX: IR TIPS: IMG2295

## 2022-02-14 HISTORY — PX: IR US GUIDE VASC ACCESS RIGHT: IMG2390

## 2022-02-14 HISTORY — PX: IR INTRAVASCULAR ULTRASOUND NON CORONARY: IMG6085

## 2022-02-14 LAB — BASIC METABOLIC PANEL
Anion gap: 12 (ref 5–15)
BUN: 74 mg/dL — ABNORMAL HIGH (ref 8–23)
CO2: 16 mmol/L — ABNORMAL LOW (ref 22–32)
Calcium: 9.8 mg/dL (ref 8.9–10.3)
Chloride: 106 mmol/L (ref 98–111)
Creatinine, Ser: 2.77 mg/dL — ABNORMAL HIGH (ref 0.61–1.24)
GFR, Estimated: 25 mL/min — ABNORMAL LOW (ref >60)
Glucose, Bld: 90 mg/dL (ref 70–99)
Potassium: 5.4 mmol/L — ABNORMAL HIGH (ref 3.5–5.1)
Sodium: 134 mmol/L — ABNORMAL LOW (ref 135–145)

## 2022-02-14 LAB — GLUCOSE, CAPILLARY
Glucose-Capillary: 104 mg/dL — ABNORMAL HIGH (ref 70–99)
Glucose-Capillary: 66 mg/dL — ABNORMAL LOW (ref 70–99)
Glucose-Capillary: 76 mg/dL (ref 70–99)

## 2022-02-14 LAB — TYPE AND SCREEN
ABO/RH(D): O POS
Antibody Screen: NEGATIVE

## 2022-02-14 LAB — CBC
HCT: 45.6 % (ref 39.0–52.0)
Hemoglobin: 15.9 g/dL (ref 13.0–17.0)
MCH: 31.7 pg (ref 26.0–34.0)
MCHC: 34.9 g/dL (ref 30.0–36.0)
MCV: 91 fL (ref 80.0–100.0)
Platelets: 161 10*3/uL (ref 150–400)
RBC: 5.01 MIL/uL (ref 4.22–5.81)
RDW: 14.2 % (ref 11.5–15.5)
WBC: 7.1 10*3/uL (ref 4.0–10.5)
nRBC: 0 % (ref 0.0–0.2)

## 2022-02-14 LAB — ABO/RH: ABO/RH(D): O POS

## 2022-02-14 LAB — PROTIME-INR
INR: 1.2 (ref 0.8–1.2)
Prothrombin Time: 15.3 seconds — ABNORMAL HIGH (ref 11.4–15.2)

## 2022-02-14 SURGERY — IR WITH ANESTHESIA
Anesthesia: General

## 2022-02-14 MED ORDER — CHLORHEXIDINE GLUCONATE 0.12 % MT SOLN
OROMUCOSAL | Status: AC
  Administered 2022-02-14: 15 mL via OROMUCOSAL
  Filled 2022-02-14: qty 15

## 2022-02-14 MED ORDER — HYDRALAZINE HCL 20 MG/ML IJ SOLN: 10.0000 mg | Freq: Four times a day (QID) | INTRAMUSCULAR | Status: DC | PRN

## 2022-02-14 MED ORDER — LIDOCAINE HCL 1 % IJ SOLN
INTRAMUSCULAR | Status: AC
  Filled 2022-02-14: qty 20

## 2022-02-14 MED ORDER — ZINC SULFATE 220 (50 ZN) MG PO CAPS
220.0000 mg | ORAL_CAPSULE | Freq: Every day | ORAL | Status: DC
  Administered 2022-02-15: 220 mg via ORAL
  Filled 2022-02-14: qty 1

## 2022-02-14 MED ORDER — DEXAMETHASONE SODIUM PHOSPHATE 10 MG/ML IJ SOLN
INTRAMUSCULAR | Status: DC | PRN
  Administered 2022-02-14: 5 mg via INTRAVENOUS

## 2022-02-14 MED ORDER — AMLODIPINE BESYLATE 2.5 MG PO TABS
2.5000 mg | ORAL_TABLET | Freq: Every day | ORAL | Status: DC
  Administered 2022-02-15: 2.5 mg via ORAL
  Filled 2022-02-14: qty 1

## 2022-02-14 MED ORDER — ROCURONIUM BROMIDE 10 MG/ML (PF) SYRINGE
PREFILLED_SYRINGE | INTRAVENOUS | Status: DC | PRN
  Administered 2022-02-14: 90 mg via INTRAVENOUS

## 2022-02-14 MED ORDER — LACTULOSE 10 GM/15ML PO SOLN: 10.0000 g | Freq: Every day | ORAL | Status: DC

## 2022-02-14 MED ORDER — INSULIN ASPART 100 UNIT/ML IJ SOLN: 0.0000 [IU] | INTRAMUSCULAR | Status: DC | PRN

## 2022-02-14 MED ORDER — ASPIRIN 81 MG PO TBEC
81.0000 mg | DELAYED_RELEASE_TABLET | Freq: Every day | ORAL | Status: DC
  Administered 2022-02-15: 81 mg via ORAL
  Filled 2022-02-14: qty 1

## 2022-02-14 MED ORDER — LACTATED RINGERS IV SOLN: INTRAVENOUS | Status: DC

## 2022-02-14 MED ORDER — ONDANSETRON HCL 4 MG/2ML IJ SOLN
4.0000 mg | Freq: Four times a day (QID) | INTRAMUSCULAR | Status: DC | PRN
  Administered 2022-02-14 – 2022-02-15 (×2): 4 mg via INTRAVENOUS
  Filled 2022-02-14 (×2): qty 2

## 2022-02-14 MED ORDER — IOHEXOL 300 MG/ML  SOLN
100.0000 mL | Freq: Once | INTRAMUSCULAR | Status: AC | PRN
  Administered 2022-02-14: 30 mL via INTRAVENOUS

## 2022-02-14 MED ORDER — VENLAFAXINE HCL ER 150 MG PO CP24
150.0000 mg | ORAL_CAPSULE | Freq: Two times a day (BID) | ORAL | Status: DC
  Administered 2022-02-15: 150 mg via ORAL
  Filled 2022-02-14 (×3): qty 1

## 2022-02-14 MED ORDER — SODIUM CHLORIDE 0.9 % IV SOLN
2.0000 g | INTRAVENOUS | Status: AC
  Administered 2022-02-14: 2 g via INTRAVENOUS
  Filled 2022-02-14: qty 20

## 2022-02-14 MED ORDER — FENTANYL CITRATE (PF) 100 MCG/2ML IJ SOLN
INTRAMUSCULAR | Status: AC
  Filled 2022-02-14: qty 2

## 2022-02-14 MED ORDER — MIDAZOLAM HCL 2 MG/2ML IJ SOLN
INTRAMUSCULAR | Status: AC
  Filled 2022-02-14: qty 2

## 2022-02-14 MED ORDER — CHLORHEXIDINE GLUCONATE 0.12 % MT SOLN: 15.0000 mL | Freq: Once | OROMUCOSAL | Status: AC

## 2022-02-14 MED ORDER — LACTULOSE 10 GM/15ML PO SOLN
20.0000 g | Freq: Three times a day (TID) | ORAL | Status: DC
  Administered 2022-02-14 – 2022-02-15 (×3): 20 g via ORAL
  Filled 2022-02-14 (×3): qty 30

## 2022-02-14 MED ORDER — FENTANYL CITRATE (PF) 100 MCG/2ML IJ SOLN
INTRAMUSCULAR | Status: DC | PRN
  Administered 2022-02-14: 100 ug via INTRAVENOUS

## 2022-02-14 MED ORDER — ZINC GLUCONATE 50 MG PO TABS: 50.0000 mg | ORAL_TABLET | Freq: Every day | ORAL | Status: DC

## 2022-02-14 MED ORDER — PROPOFOL 10 MG/ML IV BOLUS
INTRAVENOUS | Status: DC | PRN
  Administered 2022-02-14: 20 mg via INTRAVENOUS
  Administered 2022-02-14: 90 mg via INTRAVENOUS

## 2022-02-14 MED ORDER — VITAMIN C 500 MG PO TABS
500.0000 mg | ORAL_TABLET | Freq: Every day | ORAL | Status: DC
  Administered 2022-02-15: 500 mg via ORAL
  Filled 2022-02-14: qty 1

## 2022-02-14 MED ORDER — VITAMIN D3 25 MCG (1000 UNIT) PO TABS
10000.0000 [IU] | ORAL_TABLET | Freq: Every day | ORAL | Status: DC
  Administered 2022-02-15: 10000 [IU] via ORAL
  Filled 2022-02-14: qty 10

## 2022-02-14 MED ORDER — MIDAZOLAM HCL 2 MG/2ML IJ SOLN
INTRAMUSCULAR | Status: DC | PRN
  Administered 2022-02-14: 2 mg via INTRAVENOUS

## 2022-02-14 MED ORDER — ONDANSETRON HCL 4 MG/2ML IJ SOLN
INTRAMUSCULAR | Status: DC | PRN
  Administered 2022-02-14: 4 mg via INTRAVENOUS

## 2022-02-14 MED ORDER — PROPOFOL 500 MG/50ML IV EMUL
INTRAVENOUS | Status: DC | PRN
  Administered 2022-02-14: 75 ug/kg/min via INTRAVENOUS

## 2022-02-14 MED ORDER — MILK THISTLE 1000 MG PO CAPS: 1000.0000 mg | ORAL_CAPSULE | Freq: Every day | ORAL | Status: DC

## 2022-02-14 MED ORDER — ALBUMIN HUMAN 5 % IV SOLN: INTRAVENOUS | Status: DC | PRN

## 2022-02-14 MED ORDER — PHENYLEPHRINE HCL (PRESSORS) 10 MG/ML IV SOLN
INTRAVENOUS | Status: DC | PRN
  Administered 2022-02-14 (×2): 160 ug via INTRAVENOUS
  Administered 2022-02-14 (×2): 80 ug via INTRAVENOUS

## 2022-02-14 MED ORDER — VITAMIN B-12 1000 MCG PO TABS
5000.0000 ug | ORAL_TABLET | Freq: Every day | ORAL | Status: DC
  Administered 2022-02-15: 5000 ug via ORAL
  Filled 2022-02-14: qty 5

## 2022-02-14 MED ORDER — LIDOCAINE 2% (20 MG/ML) 5 ML SYRINGE
INTRAMUSCULAR | Status: DC | PRN
  Administered 2022-02-14: 60 mg via INTRAVENOUS

## 2022-02-14 MED ORDER — ACETAMINOPHEN 325 MG PO TABS
650.0000 mg | ORAL_TABLET | Freq: Four times a day (QID) | ORAL | Status: DC | PRN
  Administered 2022-02-14 – 2022-02-15 (×3): 650 mg via ORAL
  Filled 2022-02-14 (×3): qty 2

## 2022-02-14 MED ORDER — SODIUM CHLORIDE 0.9 % IV SOLN
INTRAVENOUS | Status: AC
  Filled 2022-02-14: qty 20

## 2022-02-14 MED ORDER — OXYCODONE HCL 5 MG PO TABS: 5.0000 mg | ORAL_TABLET | ORAL | Status: DC | PRN

## 2022-02-14 MED ORDER — SUGAMMADEX SODIUM 200 MG/2ML IV SOLN
INTRAVENOUS | Status: DC | PRN
  Administered 2022-02-14: 400 mg via INTRAVENOUS

## 2022-02-14 MED ORDER — VITAMIN E 45 MG (100 UNIT) PO CAPS
400.0000 [IU] | ORAL_CAPSULE | Freq: Every day | ORAL | Status: DC
  Administered 2022-02-15: 400 [IU] via ORAL
  Filled 2022-02-14: qty 4

## 2022-02-14 MED ORDER — METOPROLOL SUCCINATE ER 25 MG PO TB24
25.0000 mg | ORAL_TABLET | Freq: Every day | ORAL | Status: DC
  Filled 2022-02-14: qty 1

## 2022-02-14 MED ORDER — OMEGA-3-ACID ETHYL ESTERS 1 G PO CAPS
2.0000 g | ORAL_CAPSULE | Freq: Every day | ORAL | Status: DC
  Administered 2022-02-15: 2 g via ORAL
  Filled 2022-02-14: qty 2

## 2022-02-14 MED ORDER — FENTANYL CITRATE (PF) 100 MCG/2ML IJ SOLN: 25.0000 ug | INTRAMUSCULAR | Status: DC | PRN

## 2022-02-14 MED ORDER — ORAL CARE MOUTH RINSE: 15.0000 mL | Freq: Once | OROMUCOSAL | Status: AC

## 2022-02-14 MED ORDER — PHENYLEPHRINE HCL-NACL 20-0.9 MG/250ML-% IV SOLN
INTRAVENOUS | Status: DC | PRN
  Administered 2022-02-14: 25 ug/min via INTRAVENOUS

## 2022-02-14 MED ORDER — SODIUM CHLORIDE 0.9 % IV SOLN: INTRAVENOUS | Status: DC

## 2022-02-14 MED ORDER — OXYCODONE HCL 5 MG PO TABS: 10.0000 mg | ORAL_TABLET | ORAL | Status: DC | PRN

## 2022-02-14 MED ORDER — PANTOPRAZOLE SODIUM 40 MG PO TBEC
40.0000 mg | DELAYED_RELEASE_TABLET | Freq: Every day | ORAL | Status: DC
  Administered 2022-02-15: 40 mg via ORAL
  Filled 2022-02-14: qty 1

## 2022-02-14 NOTE — Anesthesia Postprocedure Evaluation (Signed)
Anesthesia Post Note  Patient: Shawn [name].  Procedure(s) Performed: TIPS     Patient location during evaluation: PACU Anesthesia Type: General Level of consciousness: awake and alert Pain management: pain level controlled Vital Signs Assessment: post-procedure vital signs reviewed and stable Respiratory status: spontaneous breathing, nonlabored ventilation and respiratory function stable Cardiovascular status: blood pressure returned to baseline and stable Postop Assessment: no apparent nausea or vomiting Anesthetic complications: no  No notable events documented.  Last Vitals:  Vitals:   02/14/22 1430 02/14/22 1508  BP: 102/73 95/68  Pulse: 82 77  Resp: 11 16  Temp:  36.4 C  SpO2: 99% 99%    Last Pain:  Vitals:   02/14/22 1508  TempSrc: Oral  PainSc: 0-No pain                 Geovanny Sartin

## 2022-02-14 NOTE — Anesthesia Procedure Notes (Signed)
Procedure Name: Intubation Date/Time: 02/14/2022 11:52 AM  Performed by: Moshe Salisbury, CRNAPre-anesthesia Checklist: Patient identified, Emergency Drugs available, Suction available and Patient being monitored Patient Re-evaluated:Patient Re-evaluated prior to induction Oxygen Delivery Method: Circle System Utilized Preoxygenation: Pre-oxygenation with 100% oxygen Induction Type: IV induction Ventilation: Mask ventilation without difficulty Laryngoscope Size: Mac and 4 Grade View: Grade II Tube type: Oral Tube size: 7.5 mm Number of attempts: 1 Airway Equipment and Method: Stylet Placement Confirmation: ETT inserted through vocal cords under direct vision, positive ETCO2 and breath sounds checked- equal and bilateral Secured at: 21 cm Tube secured with: Tape Dental Injury: Teeth and Oropharynx as per pre-operative assessment

## 2022-02-14 NOTE — Transfer of Care (Signed)
Immediate Anesthesia Transfer of Care Note  Patient: Shawn [name].  Procedure(s) Performed: TIPS  Patient Location: PACU  Anesthesia Type:General  Level of Consciousness: drowsy and patient cooperative  Airway & Oxygen Therapy: Patient Spontanous Breathing and Patient connected to nasal cannula oxygen  Post-op Assessment: Report given to RN, Post -op Vital signs reviewed and stable, and Patient moving all extremities  Post vital signs: Reviewed and stable  Last Vitals:  Vitals Value Taken Time  BP 118/79 02/14/22 1335  Temp 37 C 02/14/22 1335  Pulse 73 02/14/22 1336  Resp 13 02/14/22 1336  SpO2 100 % 02/14/22 1336  Vitals shown include unvalidated device data.  Last Pain:  Vitals:   02/14/22 1335  TempSrc:   PainSc: Asleep         Complications: No notable events documented.

## 2022-02-14 NOTE — Procedures (Deleted)
  The note originally documented on this encounter has been moved the the encounter in which it belongs.  

## 2022-02-14 NOTE — H&P (Signed)
Chief Complaint: Patient was seen in consultation today for portal hypertension with recurrent ascites.   Referring Physician(s): Dr. Gala Romney   Supervising Physician: Ruthann Cancer  Patient Status: [facility] - Out-pt  History of Present Illness: Shawn [name]. is a 66 y.o. male with a medical history significant for ADHD, anxiety/depression, stroke, HTN, DM2 and obstructive sleep apnea. He also has a history of NASH cirrhosis with recurrent ascites and concomitant renal insufficiency, presumed hepatorenal syndrome. He is familiar to IR from numerous paracenteses and was referred to the Portal Hypertension Clinic for evaluation for possible TIPS creation.   Shawn [name] met with Dr. Serafina Royals via virtual telephone consultation 12/03/21. He was first diagnosed with cirrhosis in 2013 and required his first paracentesis in 2021. He only required the one procedure until August of this year. He has had 10 paracenteses since then and he is interested in pursuing a TIPS procedure so he does not have to undergo such frequent paracenteses. He was previously on diuretics but these had to be discontinued due to worsening renal function.   He was deemed a favorable candidate for TIPS creation particularly given the limitations of diuretics and renal dysfunction. The risks, benefits, procedural details and post-procedure expectations were discussed at length and the patient was agreeable to proceed.   Past Medical History:  Diagnosis Date   ADD (attention deficit disorder)    ADHD    Anxiety 1987   mild (crowds, noises)   Anxiety    Attention deficit disorder    Cancer (HCC)    Cirrhosis of liver (HCC)    CVA (cerebral vascular accident) ([place])    Depression    Diabetic neuropathy ([place])    Essential hypertension, benign    Long-standing history, negative secondary workup   Headache    History of cardiac catheterization    Widely patent coronary and renal arteries November 2013   Hyperlipidemia     Hypertension    whitecoat   Obstructive sleep apnea    cpap   Prediabetes    Sleep apnea    TIA (transient ischemic attack)    Possible, 2007   Type 2 diabetes mellitus (HCC)    Venous stasis     Past Surgical History:  Procedure Laterality Date   ANTERIOR CERVICAL DECOMP/DISCECTOMY FUSION N/A 12/30/2015   Procedure: ANTERIOR CERVICAL DECOMPRESSION/DISCECTOMY FUSION C6 - C7 1 LEVEL;  Surgeon: Melina Schools, MD;  Location: [place];  Service: Orthopedics;  Laterality: N/A;   CARDIAC CATHETERIZATION  01/2012   negative   CARPAL TUNNEL RELEASE     CERVICAL SPINE NERVE BLOCK  05/17/2017   IR RADIOLOGIST EVAL & MGMT  12/03/2021   IR RADIOLOGIST EVAL & MGMT  01/13/2022   KNEE SURGERY Right    NECK SURGERY     VASECTOMY      Allergies: Patient has no known allergies.  Medications: Prior to Admission medications   Medication Sig Start Date End Date Taking? Authorizing Provider  amLODipine (NORVASC) 2.5 MG tablet Take 1 tablet (2.5 mg total) by mouth daily. 11/29/21   [name], MD  ascorbic acid (VITAMIN C) 500 MG tablet Take 500 mg by mouth daily.    [provider]  aspirin EC 81 MG tablet Take 81 mg by mouth daily. Swallow whole.    [provider]  blood glucose meter kit and supplies KIT Dispense based on patient and insurance preference. Use daily as directed. (FOR ICD - 10 E11.9) 05/02/14   [name],  MD  blood glucose meter kit and supplies Dispense based on patient and insurance preference. Use to check sugars once per day. Dx.E11.9 06/30/21   [name], MD  canagliflozin (INVOKANA) 100 MG TABS tablet Take 1 tablet (100 mg total) by mouth daily before breakfast. Patient not taking: Reported on 02/11/2022 06/30/21   [name], MD  Cholecalciferol 250 MCG (10000 UT) CAPS Take 10,000 Units by mouth daily.    [provider]  clonazePAM (KLONOPIN) 0.5 MG tablet 1 pill taken 2 or 3 times a day for essential tremor Patient not taking:  Reported on 02/11/2022 11/01/21   [name], MD  Cyanocobalamin (B-12 PO) Take 5,000 mg by mouth daily.    [provider]  fluticasone (FLONASE) 50 MCG/ACT nasal spray Place 2 sprays into both nostrils daily. Patient not taking: Reported on 02/11/2022 11/29/21   [name], MD  glucose blood [facility] VERIO) test strip USE AS INSTRUCTED TO TEST BLOOD SUGAR DAILY 03/30/20   [name], MD  lactulose (CHRONULAC) 10 GM/15ML solution Take 10 g by mouth daily. 01/24/22 01/24/23  [provider]  Lancets (ONETOUCH DELICA PLUS [id number]) [place] USE TO TEST BLOOD SUGAR DAILY 10/11/21   [name], MD  metFORMIN (GLUCOPHAGE) 500 MG tablet 1 qd Patient taking differently: Take 500 mg by mouth daily with breakfast. 11/01/21   [name], MD  metoprolol succinate (TOPROL-XL) 25 MG 24 hr tablet Take 1 tablet (25 mg total) by mouth daily. with food 11/01/21   [name], MD  Milk Thistle 1000 MG CAPS Take 1,000 mg by mouth daily.    [provider]  Omega-3 Fatty Acids (FISH OIL) 1000 MG CAPS Take 1,000 mg by mouth daily.    [provider]  OVER THE COUNTER MEDICATION Take 1 tablet by mouth daily. Liver MD    [provider]  pantoprazole (PROTONIX) 40 MG tablet Take 1 tablet (40 mg total) by mouth daily. 11/01/21   [name], MD  potassium chloride SA (KLOR-CON M20) 20 MEQ tablet TAKE 1 TABLET BY MOUTH EVERY DAY Patient not taking: Reported on 02/11/2022 11/26/21   [name], MD  rosuvastatin (CRESTOR) 20 MG tablet Take 1 tablet (20 mg total) by mouth daily. Patient not taking: Reported on 02/11/2022 12/30/20   [name], MD  venlafaxine XR (EFFEXOR-XR) 150 MG 24 hr capsule Take 1 capsule (150 mg total) by mouth 2 (two) times daily. 11/01/21   [name], MD  vitamin E 180 MG (400 UNITS) capsule Take 400 Units by mouth daily.    [provider]  zinc gluconate 50 MG tablet Take 50 mg by mouth daily.    [provider]     Family History  Problem Relation Age of Onset   Colon cancer Mother    Hypertension Mother    CAD Mother    Breast cancer Mother    Cirrhosis Brother     Social History   Socioeconomic History   Marital status: Married    Spouse name: Not on file   Number of children: 4   Years of education: Not on file   Highest education level: Not on file  Occupational History   Occupation: maintainer   Tobacco Use   Smoking status: Never   Smokeless tobacco: Current    Types: Snuff   Tobacco comments:    Uses dip pouches occasionally  Vaping Use   Vaping Use: Never  used  Substance and Sexual Activity   Alcohol use: No    Alcohol/week: 0.0 standard drinks of alcohol    Comment: Previously 1-2 cases of beer/week. patient states never on regular basis though. last etoh in 2013.    Drug use: No   Sexual activity: Not Currently  Other Topics Concern   Not on file  Social History Narrative   ** Merged History Encounter **       Social Determinants of Health   Financial Resource Strain: Low Risk  (01/04/2022)   Overall Financial Resource Strain (CARDIA)    Difficulty of Paying Living Expenses: Not hard at all  Food Insecurity: No Food Insecurity (01/04/2022)   Hunger Vital Sign    Worried About Running Out of Food in the Last Year: Never true    Ran Out of Food in the Last Year: Never true  Transportation Needs: No Transportation Needs (01/04/2022)   PRAPARE - Hydrologist (Medical): No    Lack of Transportation (Non-Medical): No  Physical Activity: Inactive (01/04/2022)   Exercise Vital Sign    Days of Exercise per Week: 0 days    Minutes of Exercise per Session: 0 min  Stress: No Stress Concern Present (01/04/2022)   [place]    Feeling of Stress : Only a little  Social Connections: Moderately Integrated (01/04/2022)   Social Connection and Isolation Panel  [NHANES]    Frequency of Communication with Friends and Family: More than three times a week    Frequency of Social Gatherings with Friends and Family: Once a week    Attends Religious Services: More than 4 times per year    Active Member of [employer] or Organizations: No    Attends Archivist Meetings: Never    Marital Status: Married    Review of Systems: A 12 point ROS discussed and pertinent positives are indicated in the HPI above.  All other systems are negative.  Review of Systems  Constitutional:  Negative for appetite change and fatigue.  Respiratory:  Negative for cough and shortness of breath.   Cardiovascular:  Positive for leg swelling. Negative for chest pain.  Gastrointestinal:  Positive for abdominal distention and nausea. Negative for abdominal pain, diarrhea and vomiting.  Neurological:  Negative for dizziness and headaches.    Vital Signs: Temp: 98.1, BP 124/91, HR 78, RR 18, 99% on Room air   Physical Exam Constitutional:      General: He is not in acute distress.    Appearance: He is not ill-appearing.  HENT:     Mouth/Throat:     Mouth: Mucous membranes are dry.     Pharynx: Oropharynx is clear.  Cardiovascular:     Rate and Rhythm: Normal rate and regular rhythm.     Pulses: Normal pulses.     Heart sounds: Normal heart sounds.  Pulmonary:     Effort: Pulmonary effort is normal.     Breath sounds: Normal breath sounds.  Abdominal:     General: Bowel sounds are normal. There is distension.     Palpations: Abdomen is soft.  Musculoskeletal:     Right lower leg: Edema present.     Left lower leg: Edema present.  Skin:    General: Skin is warm and dry.  Neurological:     Mental Status: He is alert and oriented to person, place, and time.  Psychiatric:        Mood  and Affect: Mood normal.        Behavior: Behavior normal.        Thought Content: Thought content normal.        Judgment: Judgment normal.     Imaging: US  Paracentesis  Result Date: 02/10/2022 INDICATION: Portal hypertension; refractory ascites EXAM: ULTRASOUND GUIDED RLQ PARACENTESIS MEDICATIONS: 10 cc 1% lidocaine COMPLICATIONS: None immediate. PROCEDURE: Informed written consent was obtained from the patient after a discussion of the risks, benefits and alternatives to treatment. A timeout was performed prior to the initiation of the procedure. Initial ultrasound scanning demonstrates a large amount of ascites within the right lower abdominal quadrant. The right lower abdomen was prepped and draped in the usual sterile fashion. 1% lidocaine was used for local anesthesia. Following this, a 7 cm Yueh catheter was introduced. An ultrasound image was saved for documentation purposes. The paracentesis was performed. The catheter was removed and a dressing was applied. The patient tolerated the procedure well without immediate post procedural complication. Patient received post-procedure intravenous albumin; see nursing notes for details. FINDINGS: A total of approximately 4 liters of cloudy yellow fluid was removed. Samples were sent to the laboratory as requested by the clinical team. IMPRESSION: Successful ultrasound-guided paracentesis yielding 4 liters of peritoneal fluid. TIPS scheduled with Dr Serafina Royals 02/14/22 Read by Lavonia Drafts [facility] Electronically Signed   By: Lavonia Dana M.D.   On: 02/10/2022 11:11   US Paracentesis  Result Date: 02/07/2022 INDICATION: Cirrhosis, ascites EXAM: ULTRASOUND GUIDED DIAGNOSTIC AND THERAPEUTIC PARACENTESIS MEDICATIONS: None. COMPLICATIONS: None immediate. PROCEDURE: Informed written consent was obtained from the patient after a discussion of the risks, benefits and alternatives to treatment. A timeout was performed prior to the initiation of the procedure. Initial ultrasound scanning demonstrates a large amount of ascites within the right lower abdominal quadrant. The right lower abdomen was prepped and draped in the usual  sterile fashion. 1% lidocaine was used for local anesthesia. Following this, a 19 gauge, 7-cm, Yueh catheter was introduced. An ultrasound image was saved for documentation purposes. The paracentesis was performed. The catheter was removed and a dressing was applied. The patient tolerated the procedure well without immediate post procedural complication. Patient received post-procedure intravenous albumin; see nursing notes for details. FINDINGS: A total of approximately 4 L of cloudy light yellow ascitic fluid was removed. Samples were sent to the laboratory as requested by the clinical team. IMPRESSION: Successful ultrasound-guided paracentesis yielding 4 liters of peritoneal fluid. Electronically Signed   By: Lavonia Dana M.D.   On: 02/07/2022 11:41   US Paracentesis  Result Date: 02/03/2022 INDICATION: Recurrent ascites; portal hypertension EXAM: ULTRASOUND GUIDED RIGHT LOWER QUADRANT PARACENTESIS MEDICATIONS: 1-0 cc 1% lidocaine. COMPLICATIONS: None immediate. PROCEDURE: Informed written consent was obtained from the patient after a discussion of the risks, benefits and alternatives to treatment. A timeout was performed prior to the initiation of the procedure. Initial ultrasound scanning demonstrates a large amount of ascites within the right lower abdominal quadrant. The right lower abdomen was prepped and draped in the usual sterile fashion. 1% lidocaine was used for local anesthesia. Following this, a Yueh catheter was introduced. An ultrasound image was saved for documentation purposes. The paracentesis was performed. The catheter was removed and a dressing was applied. The patient tolerated the procedure well without immediate post procedural complication. Patient received post-procedure intravenous albumin; see nursing notes for details. FINDINGS: A total of approximately 4 liters (max) of cloudy yellow fluid was removed. Samples were sent to the laboratory  as requested by the clinical team.  IMPRESSION: Successful ultrasound-guided paracentesis yielding 4 liters of peritoneal fluid. PLAN: The patient has previously been formally evaluated by the [facility] Interventional Radiology Portal Hypertension Clinic and is being actively followed for potential future intervention. TIPS scheduled for 02/14/22 Read by Lavonia Drafts [facility] Supervising physician: Van Clines, MD Electronically Signed   By: Van Clines M.D.   On: 02/03/2022 10:39   US Paracentesis  Result Date: 01/31/2022 INDICATION: Ascites. EXAM: ULTRASOUND GUIDED therapeutic and diagnostic PARACENTESIS MEDICATIONS: None. COMPLICATIONS: January 28, 2022. PROCEDURE: Informed written consent was obtained from the patient after a discussion of the risks, benefits and alternatives to treatment. A timeout was performed prior to the initiation of the procedure. Initial ultrasound scanning demonstrates a large amount of ascites within the right lower abdominal quadrant. The right lower abdomen was prepped and draped in the usual sterile fashion. 1% lidocaine was used for local anesthesia. Following this, a paracentesis catheter was introduced. An ultrasound image was saved for documentation purposes. The paracentesis was performed. The catheter was removed and a dressing was applied. The patient tolerated the procedure well without immediate post procedural complication. FINDINGS: A total of approximately 4 L of cloudy fluid was removed. Samples were sent to the laboratory as requested by the clinical team. IMPRESSION: Successful ultrasound-guided paracentesis yielding 4 liters of peritoneal fluid. Electronically Signed   By: Marijo Conception M.D.   On: 01/31/2022 11:10   US Paracentesis  Result Date: 01/28/2022 INDICATION: Cirrhosis, ascites EXAM: ULTRASOUND GUIDED DIAGNOSTIC AND THERAPEUTIC PARACENTESIS MEDICATIONS: None. COMPLICATIONS: None immediate. PROCEDURE: Informed written consent was obtained from the patient after a  discussion of the risks, benefits and alternatives to treatment. A timeout was performed prior to the initiation of the procedure. Initial ultrasound scanning demonstrates a large amount of ascites within the LEFT lower abdominal quadrant. The right lower abdomen was prepped and draped in the usual sterile fashion. 1% lidocaine was used for local anesthesia. Following this, a 19 gauge, 7-cm, Yueh catheter was introduced. An ultrasound image was saved for documentation purposes. The paracentesis was performed. The catheter was removed and a dressing was applied. The patient tolerated the procedure well without immediate post procedural complication. Patient received post-procedure intravenous albumin; see nursing notes for details. FINDINGS: A total of approximately 4 L of cloudy yellow ascitic fluid was removed. Samples were sent to the laboratory as requested by the clinical team. IMPRESSION: Successful ultrasound-guided paracentesis yielding 4 liters of peritoneal fluid. Electronically Signed   By: Lavonia Dana M.D.   On: 01/28/2022 10:55   US Paracentesis  Result Date: 01/24/2022 INDICATION: Cirrhosis, ascites EXAM: ULTRASOUND GUIDED DIAGNOSTIC AND THERAPEUTIC PARACENTESIS MEDICATIONS: None. COMPLICATIONS: None immediate. PROCEDURE: Informed written consent was obtained from the patient after a discussion of the risks, benefits and alternatives to treatment. A timeout was performed prior to the initiation of the procedure. Initial ultrasound scanning demonstrates a large amount of ascites within the LEFT lower abdominal quadrant. The right lower abdomen was prepped and draped in the usual sterile fashion. 1% lidocaine was used for local anesthesia. Following this, a 19 gauge, 7-cm, Yueh catheter was introduced. An ultrasound image was saved for documentation purposes. The paracentesis was performed. The catheter was removed and a dressing was applied. The patient tolerated the procedure well without immediate  post procedural complication. FINDINGS: A total of approximately 4.0 L of slightly cloudy yellow fluid was removed. Samples were sent to the laboratory as requested by the clinical team. IMPRESSION: Successful  ultrasound-guided paracentesis yielding 4.0 liters of peritoneal fluid. Electronically Signed   By: Lavonia Dana M.D.   On: 01/24/2022 11:28   US Paracentesis  Result Date: 01/21/2022 INDICATION: Cirrhosis, ascites, question spontaneous bacterial peritonitis EXAM: ULTRASOUND GUIDED DIAGNOSTIC PARACENTESIS MEDICATIONS: None. COMPLICATIONS: None immediate. PROCEDURE: Informed written consent was obtained from the patient after a discussion of the risks, benefits and alternatives to treatment. A timeout was performed prior to the initiation of the procedure. Initial ultrasound scanning demonstrates a large amount of ascites within the right lower abdominal quadrant. The right lower abdomen was prepped and draped in the usual sterile fashion. 1% lidocaine was used for local anesthesia. Following this, a 19 gauge, 7-cm, Yueh catheter was introduced. An ultrasound image was saved for documentation purposes. The paracentesis was performed. The catheter was removed and a dressing was applied. The patient tolerated the procedure well without immediate post procedural complication. FINDINGS: A total of approximately 180 mL of cloudy yellow ascitic fluid was removed. Samples were sent to the laboratory as requested by the clinical team. Patient had therapeutic paracentesis yesterday; large volume paracentesis was not repeated today. IMPRESSION: Successful ultrasound-guided paracentesis yielding 180 mL of peritoneal fluid. Electronically Signed   By: Lavonia Dana M.D.   On: 01/21/2022 11:47   US Paracentesis  Result Date: 01/20/2022 INDICATION: Recurrent ascites; portal hypertension EXAM: ULTRASOUND GUIDED RLQ PARACENTESIS MEDICATIONS: 10 cc 1% lidocaine. COMPLICATIONS: None immediate. PROCEDURE: Informed written  consent was obtained from the patient after a discussion of the risks, benefits and alternatives to treatment. A timeout was performed prior to the initiation of the procedure. Initial ultrasound scanning demonstrates a large amount of ascites within the right lower abdominal quadrant. The right lower abdomen was prepped and draped in the usual sterile fashion. 1% lidocaine was used for local anesthesia. Following this, a Yueh catheter was introduced. An ultrasound image was saved for documentation purposes. The paracentesis was performed. The catheter was removed and a dressing was applied. The patient tolerated the procedure well without immediate post procedural complication. Patient received post-procedure intravenous albumin; see nursing notes for details. FINDINGS: A total of approximately 4 liters (max) of cloudy yellow fluid was removed. Samples were sent to the laboratory as requested by the clinical team. IMPRESSION: Successful ultrasound-guided paracentesis yielding 4 liters of peritoneal fluid. PLAN: The patient has previously been formally evaluated by the [facility] Interventional Radiology Portal Hypertension Clinic and is being actively followed for potential future intervention. Read by Lavonia Drafts [facility] Electronically Signed   By: Lavonia Dana M.D.   On: 01/20/2022 12:34   US Paracentesis  Result Date: 01/18/2022 INDICATION: Cirrhosis, ascites EXAM: ULTRASOUND GUIDED DIAGNOSTIC AND THERAPEUTIC PARACENTESIS MEDICATIONS: None. COMPLICATIONS: None immediate. PROCEDURE: Informed written consent was obtained from the patient after a discussion of the risks, benefits and alternatives to treatment. A timeout was performed prior to the initiation of the procedure. Initial ultrasound scanning demonstrates a large amount of ascites within the right lower abdominal quadrant. The right lower abdomen was prepped and draped in the usual sterile fashion. 1% lidocaine was used for local anesthesia. Following  this, a 5 [place] Yueh catheter was introduced. An ultrasound image was saved for documentation purposes. The paracentesis was performed. The catheter was removed and a dressing was applied. The patient tolerated the procedure well without immediate post procedural complication. FINDINGS: A total of approximately 4 L of cloudy light yellow fluid was removed. Samples were sent to the laboratory as requested by the clinical team. IMPRESSION: Successful ultrasound-guided  paracentesis yielding 4 liters of peritoneal fluid. Electronically Signed   By: Lavonia Dana M.D.   On: 01/18/2022 13:02    Labs:  CBC: Recent Labs    10/18/21 1058  WBC 6.1  HGB 14.6  HCT 42.3  PLT 151    COAGS: Recent Labs    10/18/21 1058 11/02/21 1055 11/25/21 1210  INR 1.2 1.2 1.2    BMP: Recent Labs    11/02/21 1055 11/19/21 1525 11/24/21 1547 11/25/21 1210  NA 138 141 140 140  K 4.8 4.2 4.3 4.3  CL 105 108* 107* 107*  CO2 19* 19* 18* 20  GLUCOSE 84 77 150* 85  BUN 24 35* 36* 40*  CALCIUM 9.3 9.2 9.1 9.1  CREATININE 1.78* 2.01* 2.02* 2.04*    LIVER FUNCTION TESTS: Recent Labs    10/18/21 1058 10/18/21 1105 11/02/21 1055 11/25/21 1210  BILITOT 0.7 0.7 0.5 0.6  AST 35 37 34 36  ALT _0 ALKPHOS 129* 123* 114 118  PROT 7.0 7.1 6.5 6.4  ALBUMIN 3.4* 3.8* 3.5* 3.7*    TUMOR MARKERS: No results for input(s): "AFPTM", "CEA", "CA199", "CHROMGRNA" in the last 8760 hours.  Assessment and Plan:  NASH Cirrhosis with recurrent large volume ascites: Green L. Alyson Reedy., 66 year old male, presents today to the [facility] Interventional Radiology department for an image-guided paracentesis and transjugular intrahepatic portosystemic shunt creation. This procedure will be done under general anesthesia with planned overnight observation.   Risks and benefits of TIPS, BRTO and/or additional variceal embolization were discussed with the patient and/or the patient's family including, but not limited  to, infection, bleeding, damage to adjacent structures, worsening hepatic and/or cardiac function, worsening and/or the development of altered mental status/encephalopathy, non-target embolization and death.   This interventional procedure involves the use of X-rays and because of the nature of the planned procedure, it is possible that we will have prolonged use of X-ray fluoroscopy.  Potential radiation risks to you include (but are not limited to) the following: - A slightly elevated risk for cancer several years later in life. This risk is typically less than 0.5% percent. This risk is low in comparison to the normal incidence of human cancer, which is 33% for women and 50% for men according to the [employer]. - Radiation induced injury can include skin redness, resembling a rash, tissue breakdown / ulcers and hair loss (which can be temporary or permanent).   The likelihood of either of these occurring depends on the difficulty of the procedure and whether you are sensitive to radiation due to previous procedures, disease, or genetic conditions.   IF your procedure requires a prolonged use of radiation, you will be notified and given written instructions for further action.  It is your responsibility to monitor the irradiated area for the 2 weeks following the procedure and to notify your physician if you are concerned that you have suffered a radiation induced injury.    All of the patient's questions were answered, patient is agreeable to proceed. He has been NPO. Labs are pending but will be reviewed prior to the start of the procedure.   Consent signed and in chart.  Thank you for this interesting consult.  I greatly enjoyed meeting Rakim L Steen Bisig. and look forward to participating in their care.  A copy of this report was sent to the requesting provider on this date.  Electronically Signed: Soyla Dryer, AGACNP-BC [phone] 02/14/2022, 10:20 AM   I spent  a  total of  30 Minutes   in face to face in clinical consultation, greater than 50% of which was counseling/coordinating care for TIPS with paracentesis.

## 2022-02-14 NOTE — Progress Notes (Addendum)
Pre TIPS Right Atrial Pressure: Mean 12  Pre TIPS Portal Vein Pressure: Mean 34  Pre TIPS Gradient: 22   Post TIPS Main portal vein Pressure: 25  Post TIPS Right Atrial Pressure: 16  Post TIPS Gradient: 9   Paracentesis- 5L removed

## 2022-02-14 NOTE — Progress Notes (Deleted)
  The note originally documented on this encounter has been moved the the encounter in which it belongs.  

## 2022-02-14 NOTE — H&P (Deleted)
  The note originally documented on this encounter has been moved the the encounter in which it belongs.  

## 2022-02-14 NOTE — Anesthesia Procedure Notes (Signed)
Arterial Line Insertion Start/End12/01/2022 11:04 AM, 02/14/2022 11:12 AM Performed by: Oleta Mouse, MD, anesthesiologist  Patient location: OR. Preanesthetic checklist: patient identified, IV checked, risks and benefits discussed, surgical consent, monitors and equipment checked, pre-op evaluation, timeout performed and anesthesia consent radial was placed Catheter size: 20 G Hand hygiene performed  and maximum sterile barriers used   Attempts: 1 Procedure performed using ultrasound guided technique. Ultrasound Notes:anatomy identified, needle tip was noted to be adjacent to the nerve/plexus identified, no ultrasound evidence of intravascular and/or intraneural injection and image(s) printed for medical record Following insertion, dressing applied and Biopatch. Post procedure assessment: normal and unchanged  Post procedure complications: second provider assisted. Patient tolerated the procedure well with no immediate complications.

## 2022-02-14 NOTE — Progress Notes (Signed)
Notified Dr. Lillard Anes that pt took his aspirin and Omega 3 this am . No orders at this time.

## 2022-02-14 NOTE — Progress Notes (Signed)
Patient arrived on unit from PACU at 1459, is alert and oriented, on RA, dressings to right neck, abdomen and groin noted to be clean dry and intact, verbalized no complaints, no distress noted, was oriented to unit and room environment , call light within reach, will continue to monitor.

## 2022-02-14 NOTE — Procedures (Signed)
Interventional Radiology Procedure Note  Procedure:  1) Ultrasound guided paracentesis 2) TIPS creation  Findings: Please refer to procedural dictation for full description. 7+2 mm middle hepatic to right portal TIPS.    Mean Pressures (mmHg): Pre TIPS  RA = 12 PV = 34 Gradient = 22  Post TIPS RA = 16 PV = 25 Gradient = 9  Complications: None immediate  Estimated Blood Loss: < 5 mL  Recommendations: Admit to IR, hope for discharge home tomorrow am. Resume home medications. Start lactulose TID. CBC, CMP, INR in am.   Ruthann Cancer, MD

## 2022-02-14 NOTE — Progress Notes (Signed)
Patient seen on the floor post TIPS procedure this afternoon in IR. He reports mild nausea but otherwise doing well, tolerating liquids and would like to have more 'real food.' Foley catheter remains in place draining clear yellow urine.  Plan: - Discontinue Foley - Regular diet if able to tolerate without n/v - Lactulose 10 G QD - Plan for discharge tomorrow if patient stable and able to void/have bowel movement.  Please call on call IR MD for overnight questions or concerns.  Candiss Norse, PA-C

## 2022-02-14 NOTE — Anesthesia Preprocedure Evaluation (Signed)
Anesthesia Evaluation  Patient identified by MRN, date of birth, ID band Patient awake    Reviewed: Allergy & Precautions, NPO status , Patient's Chart, lab work & pertinent test results  History of Anesthesia Complications Negative for: history of anesthetic complications  Airway Mallampati: III  TM Distance: >3 FB Neck ROM: Full    Dental  (+) Edentulous Upper, Edentulous Lower, Dental Advisory Given   Pulmonary shortness of breath, sleep apnea , neg COPD, neg recent URI   breath sounds clear to auscultation       Cardiovascular hypertension, Pt. on medications  Rhythm:Regular   1. Left ventricular ejection fraction, by estimation, is 70 to 75%. The  left ventricle has hyperdynamic function. The left ventricle has no  regional wall motion abnormalities. There is severe concentric left  ventricular hypertrophy. Left ventricular  diastolic parameters are consistent with Grade I diastolic dysfunction  (impaired relaxation).   2. Right ventricular systolic function is normal. The right ventricular  size is normal. Tricuspid regurgitation signal is inadequate for assessing  PA pressure.   3. Ascites noted.   4. The mitral valve is grossly normal. Mild mitral valve regurgitation.   5. The aortic valve is tricuspid. Aortic valve regurgitation is not  visualized. Aortic valve sclerosis/calcification is present, without any  evidence of aortic stenosis.   6. The inferior vena cava is normal in size with <50% respiratory  variability, suggesting right atrial pressure of 8 mmHg.      Neuro/Psych  Headaches PSYCHIATRIC DISORDERS Anxiety Depression    TIACVA    GI/Hepatic ,GERD  Medicated,,(+) Cirrhosis       Lab Results      Component                Value               Date                      ALT                      25                  11/25/2021                AST                      36                  11/25/2021                 ALKPHOS                  118                 11/25/2021                BILITOT                  0.6                 11/25/2021              Endo/Other  diabetes    Renal/GU CRFRenal diseaseLab Results      Component                Value               Date  CREATININE               2.77 (H)            02/14/2022                Musculoskeletal negative musculoskeletal ROS (+)    Abdominal   Peds  Hematology negative hematology ROS (+) Lab Results      Component                Value               Date                      WBC                      7.1                 02/14/2022                HGB                      15.9                02/14/2022                HCT                      45.6                02/14/2022                MCV                      91.0                02/14/2022                PLT                      161                 02/14/2022              Anesthesia Other Findings   Reproductive/Obstetrics                             Anesthesia Physical Anesthesia Plan  ASA: 4  Anesthesia Plan: General   Post-op Pain Management: Minimal or no pain anticipated   Induction: Intravenous  PONV Risk Score and Plan: 2 and Propofol infusion, TIVA, Ondansetron and Dexamethasone  Airway Management Planned: Oral ETT  Additional Equipment: Arterial line  Intra-op Plan:   Post-operative Plan: Extubation in OR and Possible Post-op intubation/ventilation  Informed Consent: I have reviewed the patients History and Physical, chart, labs and discussed the procedure including the risks, benefits and alternatives for the proposed anesthesia with the patient or authorized representative who has indicated his/her understanding and acceptance.     Dental advisory given  Plan Discussed with: CRNA  Anesthesia Plan Comments:        Anesthesia Quick Evaluation

## 2022-02-15 ENCOUNTER — Encounter (HOSPITAL_COMMUNITY): Payer: Self-pay | Admitting: Interventional Radiology

## 2022-02-15 LAB — COMPREHENSIVE METABOLIC PANEL
ALT: 56 U/L — ABNORMAL HIGH (ref 0–44)
AST: 71 U/L — ABNORMAL HIGH (ref 15–41)
Albumin: 2.6 g/dL — ABNORMAL LOW (ref 3.5–5.0)
Alkaline Phosphatase: 130 U/L — ABNORMAL HIGH (ref 38–126)
Anion gap: 8 (ref 5–15)
BUN: 61 mg/dL — ABNORMAL HIGH (ref 8–23)
CO2: 19 mmol/L — ABNORMAL LOW (ref 22–32)
Calcium: 9.3 mg/dL (ref 8.9–10.3)
Chloride: 108 mmol/L (ref 98–111)
Creatinine, Ser: 2.49 mg/dL — ABNORMAL HIGH (ref 0.61–1.24)
GFR, Estimated: 28 mL/min — ABNORMAL LOW (ref >60)
Glucose, Bld: 106 mg/dL — ABNORMAL HIGH (ref 70–99)
Potassium: 5.2 mmol/L — ABNORMAL HIGH (ref 3.5–5.1)
Sodium: 135 mmol/L (ref 135–145)
Total Bilirubin: 0.9 mg/dL (ref 0.3–1.2)
Total Protein: 5.7 g/dL — ABNORMAL LOW (ref 6.5–8.1)

## 2022-02-15 LAB — PROTIME-INR
INR: 1.5 — ABNORMAL HIGH (ref 0.8–1.2)
Prothrombin Time: 17.8 seconds — ABNORMAL HIGH (ref 11.4–15.2)

## 2022-02-15 LAB — CBC
HCT: 40.4 % (ref 39.0–52.0)
Hemoglobin: 14.2 g/dL (ref 13.0–17.0)
MCH: 31.7 pg (ref 26.0–34.0)
MCHC: 35.1 g/dL (ref 30.0–36.0)
MCV: 90.2 fL (ref 80.0–100.0)
Platelets: 157 10*3/uL (ref 150–400)
RBC: 4.48 MIL/uL (ref 4.22–5.81)
RDW: 14 % (ref 11.5–15.5)
WBC: 10.1 10*3/uL (ref 4.0–10.5)
nRBC: 0 % (ref 0.0–0.2)

## 2022-02-15 LAB — CULTURE, BODY FLUID W GRAM STAIN -BOTTLE: Culture: NO GROWTH

## 2022-02-15 LAB — GLUCOSE, CAPILLARY
Glucose-Capillary: 87 mg/dL (ref 70–99)
Glucose-Capillary: 111 mg/dL — ABNORMAL HIGH (ref 70–99)

## 2022-02-15 MED ORDER — ONDANSETRON HCL 4 MG PO TABS: 4.0000 mg | ORAL_TABLET | Freq: Three times a day (TID) | ORAL | 0 refills | Status: DC | PRN

## 2022-02-15 MED ORDER — SENNOSIDES-DOCUSATE SODIUM 8.6-50 MG PO TABS
1.0000 | ORAL_TABLET | Freq: Once | ORAL | Status: AC
  Administered 2022-02-15: 1 via ORAL
  Filled 2022-02-15: qty 1

## 2022-02-15 MED ORDER — LACTULOSE 10 GM/15ML PO SOLN: 10.0000 g | Freq: Three times a day (TID) | ORAL | 11 refills | Status: DC

## 2022-02-15 NOTE — Discharge Summary (Signed)
Patient ID: Shawn [name]. MRN: [id number] DOB/AGE: Jun 23, 1955 66 y.o.  Admit date: 02/14/2022 Discharge date: 02/15/2022  Supervising Physician: Ruthann Cancer  Patient Status: [facility] - In-pt  Admission Diagnoses: NASH cirrhosis, portal HTN, recurrent ascites  Discharge Diagnoses:  Principal Problem:   S/P TIPS (transjugular intrahepatic portosystemic shunt)   Discharged Condition: good  Hospital Course: 66 y/o M with history of NASH cirrhosis with recurrent ascites, portal HTN, renal insufficiency (presumed hepatorenal syndrome) who presented to [facility] IR on 12/11 for planned outpatient TIPS creation. The procedure was performed under general anesthesia without complication and the patient was admitted for overnight observation. No acute events overnight, patient is tolerating PO intake - he complains of baseline mild nausea which occurs mostly at night, requesting prescription for zofran as this helped during his hospital stay. He has voided post foley removal and had a bowel movement prior to discharge. Previously patient was taking lactulose 20g QD, discussed increasing frequency to TID with patient and wife today - they are asking how to dose this medication and pharmacy will provide syringes to aid in this. The importance of taking lactulose as prescribed to have 2-3 bowel movements per day was reviewed with patient and his wife who state understanding. Patient will follow up with IR clinic in about a month (order placed, scheduler will call patient). IR contact information in AVS and patient encouraged to call with questions or concerns. ED return precautions reviewed.  Consults: None  Significant Diagnostic Studies: IR Tips  Result Date: 02/14/2022 CLINICAL DATA:  66 year old male with history of NASH cirrhosis and recurrent ascites with possible hepatic renal syndrome. EXAM: 1. Ultrasound-guided paracentesis 2. Ultrasound-guided access of the right internal jugular vein 3.  Ultrasound-guided access of the right common femoral vein 4. Intravascular ultrasound 5. Catheterization of the portal vein 6. Portal venous and central manometry 7. Portal venogram 8. Creation of a transhepatic portal vein to hepatic vein shunt MEDICATIONS: As antibiotic prophylaxis, Rocephin 1 gm IV was ordered pre-procedure and administered intravenously within one hour of incision. ANESTHESIA/SEDATION: General - as administered by the Anesthesia department CONTRAST:  Thirty ML Omnipaque 300, intravenous FLUOROSCOPY TIME:  Fluoroscopy Time: 6 minutes (78 mGy). COMPLICATIONS: None immediate. PROCEDURE: Informed written consent was obtained from the patient after a thorough discussion of the procedural risks, benefits and alternatives. All questions were addressed. Maximal Sterile Barrier Technique was utilized including caps, mask, sterile gowns, sterile gloves, sterile drape, hand hygiene and skin antiseptic. A timeout was performed prior to the initiation of the procedure. Preprocedure ultrasound evaluation of the right lower quadrant demonstrated large volume ascites. A small skin nick was made. Under direct ultrasound visualization, a 7 [place] skater drain was inserted and there was immediate efflux of translucent, straw-colored fluid. Total of 5 L ascites was drained throughout the procedure. The drain was then removed and a sterile bandage was applied. A preliminary ultrasound of the right groin was performed and demonstrates a patent right common femoral vein. A permanent ultrasound image was recorded. Using a combination of fluoroscopy and ultrasound, an access site was determined. A small dermatotomy was made at the planned puncture site. Using ultrasound guidance, access into the right common femoral vein was obtained with visualization of needle entry into the vessel using a standard micropuncture technique. A wire was advanced into the IVC insert all fascial dilation performed. An 8 [place], 11 cm  vascular sheath was placed into the external iliac vein. Through this access site, an 8 [place] Accunav ICE catheter  was advanced with ease under fluoroscopic guidance to the level of the intrahepatic inferior vena cava. A preliminary ultrasound of the right neck was performed and demonstrates a patent internal jugular vein. A permanent ultrasound image was recorded. Using a combination of fluoroscopy and ultrasound, an access site was determined. A small dermatotomy was made at the planned puncture site. Using ultrasound guidance, access into the right internal jugular vein was obtained with visualization of needle entry into the vessel using a standard micropuncture technique. A wire was advanced into the IVC and serial fascial dilation performed. A 10 French tips sheath was placed into the internal jugular vein and advanced to the IVC. The jugular sheath was retracted into the right atrium and manometry was performed. A 5 French angled tip catheter was then directed into the middle hepatic vein. The catheter was advanced to a wedge portion of the a patent vein over which the 10 French sheath was advanced into the middle hepatic vein. Using ICE ultrasound visualization the catheter as middle hepatic vein as well as the portal anatomy was defined. A planned exit site from the hepatic vein and puncture site from the portal vein was placed into a single sonographic plane. Under direct ultrasound visualization, the ScorpionX needle was advanced into the central right portal vein. Hand injection of contrast confirmed position within the portal system. A Glidewire Advantage was then advanced into the splenic vein. A 5 French marking pigtail catheter was then advanced over the wire into the main portal vein and wire removed. Portal venogram was performed which demonstrated a patent portal vein. A few very small varices were seen arising from the main portal vein. Portal manometry was then performed. The tract was then  dilated to 8 mm with an 8 mm x 8 cm Athletis balloon. A 8-10 mm by 7 + 2 cm of Viatorr endograft was placed. There was no post deployment balloon molding. After placement of the shunt, right atrial and portal pressures were repeated. Completion portal venogram demonstrates a patent TIPS endograft without significant gastroesophageal varices. The catheters and sheath were removed and manual compression was applied to the right internal jugular and right common femoral venous access sites until hemostasis was achieved. The patient was transferred to the PACU in stable condition. Pre-TIPS Mean Pressures (mmHg): Right atrium: 12 Portal vein: 34 Portosystemic gradient: 22 Post-TIPS Mean Pressures (mmHg): Right atrium:16 Portal vein: 25 Portosystemic gradient: 9 IMPRESSION: 1. Successful transjugular portosystemic shunt creation. 2. Portosystemic gradient of 22 mm Hg (absolute portal venous pressure 34 mm Hg) before shunt placement and 9 mm Hg (absolute portal venous pressure 25 mm Hg) after shunt placement. PLAN: The patient will be followed closely by the Portal Hypertension Clinic. Ruthann Cancer, MD [facility] Radiology Electronically Signed   By: Ruthann Cancer M.D.   On: 02/14/2022 16:16   IR Paracentesis  Result Date: 02/14/2022 CLINICAL DATA:  66 year old male with history of NASH cirrhosis and recurrent ascites with possible hepatic renal syndrome. EXAM: 1. Ultrasound-guided paracentesis 2. Ultrasound-guided access of the right internal jugular vein 3. Ultrasound-guided access of the right common femoral vein 4. Intravascular ultrasound 5. Catheterization of the portal vein 6. Portal venous and central manometry 7. Portal venogram 8. Creation of a transhepatic portal vein to hepatic vein shunt MEDICATIONS: As antibiotic prophylaxis, Rocephin 1 gm IV was ordered pre-procedure and administered intravenously within one hour of incision. ANESTHESIA/SEDATION: General  - as administered by the Anesthesia department CONTRAST:  Thirty ML Omnipaque 300,  intravenous FLUOROSCOPY TIME:  Fluoroscopy Time: 6 minutes (78 mGy). COMPLICATIONS: None immediate. PROCEDURE: Informed written consent was obtained from the patient after a thorough discussion of the procedural risks, benefits and alternatives. All questions were addressed. Maximal Sterile Barrier Technique was utilized including caps, mask, sterile gowns, sterile gloves, sterile drape, hand hygiene and skin antiseptic. A timeout was performed prior to the initiation of the procedure. Preprocedure ultrasound evaluation of the right lower quadrant demonstrated large volume ascites. A small skin nick was made. Under direct ultrasound visualization, a 7 [place] skater drain was inserted and there was immediate efflux of translucent, straw-colored fluid. Total of 5 L ascites was drained throughout the procedure. The drain was then removed and a sterile bandage was applied. A preliminary ultrasound of the right groin was performed and demonstrates a patent right common femoral vein. A permanent ultrasound image was recorded. Using a combination of fluoroscopy and ultrasound, an access site was determined. A small dermatotomy was made at the planned puncture site. Using ultrasound guidance, access into the right common femoral vein was obtained with visualization of needle entry into the vessel using a standard micropuncture technique. A wire was advanced into the IVC insert all fascial dilation performed. An 8 [place], 11 cm vascular sheath was placed into the external iliac vein. Through this access site, an 61 [place] ICE catheter was advanced with ease under fluoroscopic guidance to the level of the intrahepatic inferior vena cava. A preliminary ultrasound of the right neck was performed and demonstrates a patent internal jugular vein. A permanent ultrasound image was recorded. Using a combination of fluoroscopy and ultrasound, an  access site was determined. A small dermatotomy was made at the planned puncture site. Using ultrasound guidance, access into the right internal jugular vein was obtained with visualization of needle entry into the vessel using a standard micropuncture technique. A wire was advanced into the IVC and serial fascial dilation performed. A 10 French tips sheath was placed into the internal jugular vein and advanced to the IVC. The jugular sheath was retracted into the right atrium and manometry was performed. A 5 French angled tip catheter was then directed into the middle hepatic vein. The catheter was advanced to a wedge portion of the a patent vein over which the 10 French sheath was advanced into the middle hepatic vein. Using ICE ultrasound visualization the catheter as middle hepatic vein as well as the portal anatomy was defined. A planned exit site from the hepatic vein and puncture site from the portal vein was placed into a single sonographic plane. Under direct ultrasound visualization, the ScorpionX needle was advanced into the central right portal vein. Hand injection of contrast confirmed position within the portal system. A Glidewire Advantage was then advanced into the splenic vein. A 5 French marking pigtail catheter was then advanced over the wire into the main portal vein and wire removed. Portal venogram was performed which demonstrated a patent portal vein. A few very small varices were seen arising from the main portal vein. Portal manometry was then performed. The tract was then dilated to 8 mm with an 8 mm x 8 cm Athletis balloon. A 8-10 mm by 7 + 2 cm of Viatorr endograft was placed. There was no post deployment balloon molding. After placement of the shunt, right atrial and portal pressures were repeated. Completion portal venogram demonstrates a patent TIPS endograft without significant gastroesophageal varices. The catheters and sheath were removed and manual compression was applied to the  right  internal jugular and right common femoral venous access sites until hemostasis was achieved. The patient was transferred to the PACU in stable condition. Pre-TIPS Mean Pressures (mmHg): Right atrium: 12 Portal vein: 34 Portosystemic gradient: 22 Post-TIPS Mean Pressures (mmHg): Right atrium:16 Portal vein: 25 Portosystemic gradient: 9 IMPRESSION: 1. Successful transjugular portosystemic shunt creation. 2. Portosystemic gradient of 22 mm Hg (absolute portal venous pressure 34 mm Hg) before shunt placement and 9 mm Hg (absolute portal venous pressure 25 mm Hg) after shunt placement. PLAN: The patient will be followed closely by the Portal Hypertension Clinic. Ruthann Cancer, MD [facility] Radiology Electronically Signed   By: Ruthann Cancer M.D.   On: 02/14/2022 16:16   IR US Guide Vasc Access Right  Result Date: 02/14/2022 CLINICAL DATA:  66 year old male with history of NASH cirrhosis and recurrent ascites with possible hepatic renal syndrome. EXAM: 1. Ultrasound-guided paracentesis 2. Ultrasound-guided access of the right internal jugular vein 3. Ultrasound-guided access of the right common femoral vein 4. Intravascular ultrasound 5. Catheterization of the portal vein 6. Portal venous and central manometry 7. Portal venogram 8. Creation of a transhepatic portal vein to hepatic vein shunt MEDICATIONS: As antibiotic prophylaxis, Rocephin 1 gm IV was ordered pre-procedure and administered intravenously within one hour of incision. ANESTHESIA/SEDATION: General - as administered by the Anesthesia department CONTRAST:  Thirty ML Omnipaque 300, intravenous FLUOROSCOPY TIME:  Fluoroscopy Time: 6 minutes (78 mGy). COMPLICATIONS: None immediate. PROCEDURE: Informed written consent was obtained from the patient after a thorough discussion of the procedural risks, benefits and alternatives. All questions were addressed. Maximal Sterile Barrier Technique was utilized  including caps, mask, sterile gowns, sterile gloves, sterile drape, hand hygiene and skin antiseptic. A timeout was performed prior to the initiation of the procedure. Preprocedure ultrasound evaluation of the right lower quadrant demonstrated large volume ascites. A small skin nick was made. Under direct ultrasound visualization, a 7 [place] skater drain was inserted and there was immediate efflux of translucent, straw-colored fluid. Total of 5 L ascites was drained throughout the procedure. The drain was then removed and a sterile bandage was applied. A preliminary ultrasound of the right groin was performed and demonstrates a patent right common femoral vein. A permanent ultrasound image was recorded. Using a combination of fluoroscopy and ultrasound, an access site was determined. A small dermatotomy was made at the planned puncture site. Using ultrasound guidance, access into the right common femoral vein was obtained with visualization of needle entry into the vessel using a standard micropuncture technique. A wire was advanced into the IVC insert all fascial dilation performed. An 8 [place], 11 cm vascular sheath was placed into the external iliac vein. Through this access site, an 52 [place] ICE catheter was advanced with ease under fluoroscopic guidance to the level of the intrahepatic inferior vena cava. A preliminary ultrasound of the right neck was performed and demonstrates a patent internal jugular vein. A permanent ultrasound image was recorded. Using a combination of fluoroscopy and ultrasound, an access site was determined. A small dermatotomy was made at the planned puncture site. Using ultrasound guidance, access into the right internal jugular vein was obtained with visualization of needle entry into the vessel using a standard micropuncture technique. A wire was advanced into the IVC and serial fascial dilation performed. A 10 French tips sheath was placed into the internal jugular vein and  advanced to the IVC. The jugular sheath was retracted into the right atrium and manometry was performed. A  5 French angled tip catheter was then directed into the middle hepatic vein. The catheter was advanced to a wedge portion of the a patent vein over which the 10 French sheath was advanced into the middle hepatic vein. Using ICE ultrasound visualization the catheter as middle hepatic vein as well as the portal anatomy was defined. A planned exit site from the hepatic vein and puncture site from the portal vein was placed into a single sonographic plane. Under direct ultrasound visualization, the ScorpionX needle was advanced into the central right portal vein. Hand injection of contrast confirmed position within the portal system. A Glidewire Advantage was then advanced into the splenic vein. A 5 French marking pigtail catheter was then advanced over the wire into the main portal vein and wire removed. Portal venogram was performed which demonstrated a patent portal vein. A few very small varices were seen arising from the main portal vein. Portal manometry was then performed. The tract was then dilated to 8 mm with an 8 mm x 8 cm Athletis balloon. A 8-10 mm by 7 + 2 cm of Viatorr endograft was placed. There was no post deployment balloon molding. After placement of the shunt, right atrial and portal pressures were repeated. Completion portal venogram demonstrates a patent TIPS endograft without significant gastroesophageal varices. The catheters and sheath were removed and manual compression was applied to the right internal jugular and right common femoral venous access sites until hemostasis was achieved. The patient was transferred to the PACU in stable condition. Pre-TIPS Mean Pressures (mmHg): Right atrium: 12 Portal vein: 34 Portosystemic gradient: 22 Post-TIPS Mean Pressures (mmHg): Right atrium:16 Portal vein: 25 Portosystemic gradient: 9 IMPRESSION: 1. Successful transjugular portosystemic shunt  creation. 2. Portosystemic gradient of 22 mm Hg (absolute portal venous pressure 34 mm Hg) before shunt placement and 9 mm Hg (absolute portal venous pressure 25 mm Hg) after shunt placement. PLAN: The patient will be followed closely by the Portal Hypertension Clinic. Ruthann Cancer, MD [facility] Radiology Electronically Signed   By: Ruthann Cancer M.D.   On: 02/14/2022 16:16   IR US Guide Vasc Access Right  Result Date: 02/14/2022 CLINICAL DATA:  66 year old male with history of NASH cirrhosis and recurrent ascites with possible hepatic renal syndrome. EXAM: 1. Ultrasound-guided paracentesis 2. Ultrasound-guided access of the right internal jugular vein 3. Ultrasound-guided access of the right common femoral vein 4. Intravascular ultrasound 5. Catheterization of the portal vein 6. Portal venous and central manometry 7. Portal venogram 8. Creation of a transhepatic portal vein to hepatic vein shunt MEDICATIONS: As antibiotic prophylaxis, Rocephin 1 gm IV was ordered pre-procedure and administered intravenously within one hour of incision. ANESTHESIA/SEDATION: General - as administered by the Anesthesia department CONTRAST:  Thirty ML Omnipaque 300, intravenous FLUOROSCOPY TIME:  Fluoroscopy Time: 6 minutes (78 mGy). COMPLICATIONS: None immediate. PROCEDURE: Informed written consent was obtained from the patient after a thorough discussion of the procedural risks, benefits and alternatives. All questions were addressed. Maximal Sterile Barrier Technique was utilized including caps, mask, sterile gowns, sterile gloves, sterile drape, hand hygiene and skin antiseptic. A timeout was performed prior to the initiation of the procedure. Preprocedure ultrasound evaluation of the right lower quadrant demonstrated large volume ascites. A small skin nick was made. Under direct ultrasound visualization, a 7 [place] skater drain was inserted and there was immediate efflux of  translucent, straw-colored fluid. Total of 5 L ascites was drained throughout the procedure. The drain was then removed and a sterile  bandage was applied. A preliminary ultrasound of the right groin was performed and demonstrates a patent right common femoral vein. A permanent ultrasound image was recorded. Using a combination of fluoroscopy and ultrasound, an access site was determined. A small dermatotomy was made at the planned puncture site. Using ultrasound guidance, access into the right common femoral vein was obtained with visualization of needle entry into the vessel using a standard micropuncture technique. A wire was advanced into the IVC insert all fascial dilation performed. An 8 [place], 11 cm vascular sheath was placed into the external iliac vein. Through this access site, an 83 [place] ICE catheter was advanced with ease under fluoroscopic guidance to the level of the intrahepatic inferior vena cava. A preliminary ultrasound of the right neck was performed and demonstrates a patent internal jugular vein. A permanent ultrasound image was recorded. Using a combination of fluoroscopy and ultrasound, an access site was determined. A small dermatotomy was made at the planned puncture site. Using ultrasound guidance, access into the right internal jugular vein was obtained with visualization of needle entry into the vessel using a standard micropuncture technique. A wire was advanced into the IVC and serial fascial dilation performed. A 10 French tips sheath was placed into the internal jugular vein and advanced to the IVC. The jugular sheath was retracted into the right atrium and manometry was performed. A 5 French angled tip catheter was then directed into the middle hepatic vein. The catheter was advanced to a wedge portion of the a patent vein over which the 10 French sheath was advanced into the middle hepatic vein. Using ICE ultrasound visualization the catheter as middle hepatic vein as well  as the portal anatomy was defined. A planned exit site from the hepatic vein and puncture site from the portal vein was placed into a single sonographic plane. Under direct ultrasound visualization, the ScorpionX needle was advanced into the central right portal vein. Hand injection of contrast confirmed position within the portal system. A Glidewire Advantage was then advanced into the splenic vein. A 5 French marking pigtail catheter was then advanced over the wire into the main portal vein and wire removed. Portal venogram was performed which demonstrated a patent portal vein. A few very small varices were seen arising from the main portal vein. Portal manometry was then performed. The tract was then dilated to 8 mm with an 8 mm x 8 cm Athletis balloon. A 8-10 mm by 7 + 2 cm of Viatorr endograft was placed. There was no post deployment balloon molding. After placement of the shunt, right atrial and portal pressures were repeated. Completion portal venogram demonstrates a patent TIPS endograft without significant gastroesophageal varices. The catheters and sheath were removed and manual compression was applied to the right internal jugular and right common femoral venous access sites until hemostasis was achieved. The patient was transferred to the PACU in stable condition. Pre-TIPS Mean Pressures (mmHg): Right atrium: 12 Portal vein: 34 Portosystemic gradient: 22 Post-TIPS Mean Pressures (mmHg): Right atrium:16 Portal vein: 25 Portosystemic gradient: 9 IMPRESSION: 1. Successful transjugular portosystemic shunt creation. 2. Portosystemic gradient of 22 mm Hg (absolute portal venous pressure 34 mm Hg) before shunt placement and 9 mm Hg (absolute portal venous pressure 25 mm Hg) after shunt placement. PLAN: The patient will be followed closely by the Portal Hypertension Clinic. Ruthann Cancer, MD [facility] Electronically Signed   By: Ruthann Cancer M.D.   On:  02/14/2022 16:16  IR INTRAVASCULAR ULTRASOUND NON CORONARY  Result Date: 02/14/2022 CLINICAL DATA:  67 year old male with history of NASH cirrhosis and recurrent ascites with possible hepatic renal syndrome. EXAM: 1. Ultrasound-guided paracentesis 2. Ultrasound-guided access of the right internal jugular vein 3. Ultrasound-guided access of the right common femoral vein 4. Intravascular ultrasound 5. Catheterization of the portal vein 6. Portal venous and central manometry 7. Portal venogram 8. Creation of a transhepatic portal vein to hepatic vein shunt MEDICATIONS: As antibiotic prophylaxis, Rocephin 1 gm IV was ordered pre-procedure and administered intravenously within one hour of incision. ANESTHESIA/SEDATION: General - as administered by the Anesthesia department CONTRAST:  Thirty ML Omnipaque 300, intravenous FLUOROSCOPY TIME:  Fluoroscopy Time: 6 minutes (78 mGy). COMPLICATIONS: None immediate. PROCEDURE: Informed written consent was obtained from the patient after a thorough discussion of the procedural risks, benefits and alternatives. All questions were addressed. Maximal Sterile Barrier Technique was utilized including caps, mask, sterile gowns, sterile gloves, sterile drape, hand hygiene and skin antiseptic. A timeout was performed prior to the initiation of the procedure. Preprocedure ultrasound evaluation of the right lower quadrant demonstrated large volume ascites. A small skin nick was made. Under direct ultrasound visualization, a 7 [place] skater drain was inserted and there was immediate efflux of translucent, straw-colored fluid. Total of 5 L ascites was drained throughout the procedure. The drain was then removed and a sterile bandage was applied. A preliminary ultrasound of the right groin was performed and demonstrates a patent right common femoral vein. A permanent ultrasound image was recorded. Using a combination of fluoroscopy and ultrasound, an access site was determined. A small  dermatotomy was made at the planned puncture site. Using ultrasound guidance, access into the right common femoral vein was obtained with visualization of needle entry into the vessel using a standard micropuncture technique. A wire was advanced into the IVC insert all fascial dilation performed. An 8 [place], 11 cm vascular sheath was placed into the external iliac vein. Through this access site, an 71 [place] ICE catheter was advanced with ease under fluoroscopic guidance to the level of the intrahepatic inferior vena cava. A preliminary ultrasound of the right neck was performed and demonstrates a patent internal jugular vein. A permanent ultrasound image was recorded. Using a combination of fluoroscopy and ultrasound, an access site was determined. A small dermatotomy was made at the planned puncture site. Using ultrasound guidance, access into the right internal jugular vein was obtained with visualization of needle entry into the vessel using a standard micropuncture technique. A wire was advanced into the IVC and serial fascial dilation performed. A 10 French tips sheath was placed into the internal jugular vein and advanced to the IVC. The jugular sheath was retracted into the right atrium and manometry was performed. A 5 French angled tip catheter was then directed into the middle hepatic vein. The catheter was advanced to a wedge portion of the a patent vein over which the 10 French sheath was advanced into the middle hepatic vein. Using ICE ultrasound visualization the catheter as middle hepatic vein as well as the portal anatomy was defined. A planned exit site from the hepatic vein and puncture site from the portal vein was placed into a single sonographic plane. Under direct ultrasound visualization, the ScorpionX needle was advanced into the central right portal vein. Hand injection of contrast confirmed position within the portal system. A Glidewire Advantage was then advanced into the splenic  vein. A 5 French marking pigtail catheter was then advanced over the  wire into the main portal vein and wire removed. Portal venogram was performed which demonstrated a patent portal vein. A few very small varices were seen arising from the main portal vein. Portal manometry was then performed. The tract was then dilated to 8 mm with an 8 mm x 8 cm Athletis balloon. A 8-10 mm by 7 + 2 cm of Viatorr endograft was placed. There was no post deployment balloon molding. After placement of the shunt, right atrial and portal pressures were repeated. Completion portal venogram demonstrates a patent TIPS endograft without significant gastroesophageal varices. The catheters and sheath were removed and manual compression was applied to the right internal jugular and right common femoral venous access sites until hemostasis was achieved. The patient was transferred to the PACU in stable condition. Pre-TIPS Mean Pressures (mmHg): Right atrium: 12 Portal vein: 34 Portosystemic gradient: 22 Post-TIPS Mean Pressures (mmHg): Right atrium:16 Portal vein: 25 Portosystemic gradient: 9 IMPRESSION: 1. Successful transjugular portosystemic shunt creation. 2. Portosystemic gradient of 22 mm Hg (absolute portal venous pressure 34 mm Hg) before shunt placement and 9 mm Hg (absolute portal venous pressure 25 mm Hg) after shunt placement. PLAN: The patient will be followed closely by the Portal Hypertension Clinic. Ruthann Cancer, MD [facility] Electronically Signed   By: Ruthann Cancer M.D.   On: 02/14/2022 16:16   US Paracentesis  Result Date: 02/10/2022 INDICATION: Portal hypertension; refractory ascites EXAM: ULTRASOUND GUIDED RLQ PARACENTESIS MEDICATIONS: 10 cc 1% lidocaine COMPLICATIONS: None immediate. PROCEDURE: Informed written consent was obtained from the patient after a discussion of the risks, benefits and alternatives to treatment. A timeout was performed prior to the  initiation of the procedure. Initial ultrasound scanning demonstrates a large amount of ascites within the right lower abdominal quadrant. The right lower abdomen was prepped and draped in the usual sterile fashion. 1% lidocaine was used for local anesthesia. Following this, a 7 cm Yueh catheter was introduced. An ultrasound image was saved for documentation purposes. The paracentesis was performed. The catheter was removed and a dressing was applied. The patient tolerated the procedure well without immediate post procedural complication. Patient received post-procedure intravenous albumin; see nursing notes for details. FINDINGS: A total of approximately 4 liters of cloudy yellow fluid was removed. Samples were sent to the laboratory as requested by the clinical team. IMPRESSION: Successful ultrasound-guided paracentesis yielding 4 liters of peritoneal fluid. TIPS scheduled with Dr Serafina Royals 02/14/22 Read by Lavonia Drafts [facility] Electronically Signed   By: Lavonia Dana M.D.   On: 02/10/2022 11:11   US Paracentesis  Result Date: 02/07/2022 INDICATION: Cirrhosis, ascites EXAM: ULTRASOUND GUIDED DIAGNOSTIC AND THERAPEUTIC PARACENTESIS MEDICATIONS: None. COMPLICATIONS: None immediate. PROCEDURE: Informed written consent was obtained from the patient after a discussion of the risks, benefits and alternatives to treatment. A timeout was performed prior to the initiation of the procedure. Initial ultrasound scanning demonstrates a large amount of ascites within the right lower abdominal quadrant. The right lower abdomen was prepped and draped in the usual sterile fashion. 1% lidocaine was used for local anesthesia. Following this, a 19 gauge, 7-cm, Yueh catheter was introduced. An ultrasound image was saved for documentation purposes. The paracentesis was performed. The catheter was removed and a dressing was applied. The patient tolerated the procedure well without immediate post procedural complication. Patient received  post-procedure intravenous albumin; see nursing notes for details. FINDINGS: A total of approximately 4 L of cloudy light yellow ascitic fluid was removed. Samples were sent to  the laboratory as requested by the clinical team. IMPRESSION: Successful ultrasound-guided paracentesis yielding 4 liters of peritoneal fluid. Electronically Signed   By: Lavonia Dana M.D.   On: 02/07/2022 11:41   US Paracentesis  Result Date: 02/03/2022 INDICATION: Recurrent ascites; portal hypertension EXAM: ULTRASOUND GUIDED RIGHT LOWER QUADRANT PARACENTESIS MEDICATIONS: 1-0 cc 1% lidocaine. COMPLICATIONS: None immediate. PROCEDURE: Informed written consent was obtained from the patient after a discussion of the risks, benefits and alternatives to treatment. A timeout was performed prior to the initiation of the procedure. Initial ultrasound scanning demonstrates a large amount of ascites within the right lower abdominal quadrant. The right lower abdomen was prepped and draped in the usual sterile fashion. 1% lidocaine was used for local anesthesia. Following this, a Yueh catheter was introduced. An ultrasound image was saved for documentation purposes. The paracentesis was performed. The catheter was removed and a dressing was applied. The patient tolerated the procedure well without immediate post procedural complication. Patient received post-procedure intravenous albumin; see nursing notes for details. FINDINGS: A total of approximately 4 liters (max) of cloudy yellow fluid was removed. Samples were sent to the laboratory as requested by the clinical team. IMPRESSION: Successful ultrasound-guided paracentesis yielding 4 liters of peritoneal fluid. PLAN: The patient has previously been formally evaluated by the [facility] Interventional Radiology Portal Hypertension Clinic and is being actively followed for potential future intervention. TIPS scheduled for 02/14/22 Read by Lavonia Drafts [facility] Supervising physician: Van Clines, MD Electronically Signed   By: Van Clines M.D.   On: 02/03/2022 10:39   US Paracentesis  Result Date: 01/31/2022 INDICATION: Ascites. EXAM: ULTRASOUND GUIDED therapeutic and diagnostic PARACENTESIS MEDICATIONS: None. COMPLICATIONS: January 28, 2022. PROCEDURE: Informed written consent was obtained from the patient after a discussion of the risks, benefits and alternatives to treatment. A timeout was performed prior to the initiation of the procedure. Initial ultrasound scanning demonstrates a large amount of ascites within the right lower abdominal quadrant. The right lower abdomen was prepped and draped in the usual sterile fashion. 1% lidocaine was used for local anesthesia. Following this, a paracentesis catheter was introduced. An ultrasound image was saved for documentation purposes. The paracentesis was performed. The catheter was removed and a dressing was applied. The patient tolerated the procedure well without immediate post procedural complication. FINDINGS: A total of approximately 4 L of cloudy fluid was removed. Samples were sent to the laboratory as requested by the clinical team. IMPRESSION: Successful ultrasound-guided paracentesis yielding 4 liters of peritoneal fluid. Electronically Signed   By: Marijo Conception M.D.   On: 01/31/2022 11:10   US Paracentesis  Result Date: 01/28/2022 INDICATION: Cirrhosis, ascites EXAM: ULTRASOUND GUIDED DIAGNOSTIC AND THERAPEUTIC PARACENTESIS MEDICATIONS: None. COMPLICATIONS: None immediate. PROCEDURE: Informed written consent was obtained from the patient after a discussion of the risks, benefits and alternatives to treatment. A timeout was performed prior to the initiation of the procedure. Initial ultrasound scanning demonstrates a large amount of ascites within the LEFT lower abdominal quadrant. The right lower abdomen was prepped and draped in the usual sterile fashion. 1% lidocaine was used for local anesthesia. Following this, a  19 gauge, 7-cm, Yueh catheter was introduced. An ultrasound image was saved for documentation purposes. The paracentesis was performed. The catheter was removed and a dressing was applied. The patient tolerated the procedure well without immediate post procedural complication. Patient received post-procedure intravenous albumin; see nursing notes for details. FINDINGS: A total of approximately 4 L of cloudy yellow ascitic fluid was removed. Samples  were sent to the laboratory as requested by the clinical team. IMPRESSION: Successful ultrasound-guided paracentesis yielding 4 liters of peritoneal fluid. Electronically Signed   By: Lavonia Dana M.D.   On: 01/28/2022 10:55   US Paracentesis  Result Date: 01/24/2022 INDICATION: Cirrhosis, ascites EXAM: ULTRASOUND GUIDED DIAGNOSTIC AND THERAPEUTIC PARACENTESIS MEDICATIONS: None. COMPLICATIONS: None immediate. PROCEDURE: Informed written consent was obtained from the patient after a discussion of the risks, benefits and alternatives to treatment. A timeout was performed prior to the initiation of the procedure. Initial ultrasound scanning demonstrates a large amount of ascites within the LEFT lower abdominal quadrant. The right lower abdomen was prepped and draped in the usual sterile fashion. 1% lidocaine was used for local anesthesia. Following this, a 19 gauge, 7-cm, Yueh catheter was introduced. An ultrasound image was saved for documentation purposes. The paracentesis was performed. The catheter was removed and a dressing was applied. The patient tolerated the procedure well without immediate post procedural complication. FINDINGS: A total of approximately 4.0 L of slightly cloudy yellow fluid was removed. Samples were sent to the laboratory as requested by the clinical team. IMPRESSION: Successful ultrasound-guided paracentesis yielding 4.0 liters of peritoneal fluid. Electronically Signed   By: Lavonia Dana M.D.   On: 01/24/2022 11:28   US  Paracentesis  Result Date: 01/21/2022 INDICATION: Cirrhosis, ascites, question spontaneous bacterial peritonitis EXAM: ULTRASOUND GUIDED DIAGNOSTIC PARACENTESIS MEDICATIONS: None. COMPLICATIONS: None immediate. PROCEDURE: Informed written consent was obtained from the patient after a discussion of the risks, benefits and alternatives to treatment. A timeout was performed prior to the initiation of the procedure. Initial ultrasound scanning demonstrates a large amount of ascites within the right lower abdominal quadrant. The right lower abdomen was prepped and draped in the usual sterile fashion. 1% lidocaine was used for local anesthesia. Following this, a 19 gauge, 7-cm, Yueh catheter was introduced. An ultrasound image was saved for documentation purposes. The paracentesis was performed. The catheter was removed and a dressing was applied. The patient tolerated the procedure well without immediate post procedural complication. FINDINGS: A total of approximately 180 mL of cloudy yellow ascitic fluid was removed. Samples were sent to the laboratory as requested by the clinical team. Patient had therapeutic paracentesis yesterday; large volume paracentesis was not repeated today. IMPRESSION: Successful ultrasound-guided paracentesis yielding 180 mL of peritoneal fluid. Electronically Signed   By: Lavonia Dana M.D.   On: 01/21/2022 11:47   US Paracentesis  Result Date: 01/20/2022 INDICATION: Recurrent ascites; portal hypertension EXAM: ULTRASOUND GUIDED RLQ PARACENTESIS MEDICATIONS: 10 cc 1% lidocaine. COMPLICATIONS: None immediate. PROCEDURE: Informed written consent was obtained from the patient after a discussion of the risks, benefits and alternatives to treatment. A timeout was performed prior to the initiation of the procedure. Initial ultrasound scanning demonstrates a large amount of ascites within the right lower abdominal quadrant. The right lower abdomen was prepped and draped in the usual sterile  fashion. 1% lidocaine was used for local anesthesia. Following this, a Yueh catheter was introduced. An ultrasound image was saved for documentation purposes. The paracentesis was performed. The catheter was removed and a dressing was applied. The patient tolerated the procedure well without immediate post procedural complication. Patient received post-procedure intravenous albumin; see nursing notes for details. FINDINGS: A total of approximately 4 liters (max) of cloudy yellow fluid was removed. Samples were sent to the laboratory as requested by the clinical team. IMPRESSION: Successful ultrasound-guided paracentesis yielding 4 liters of peritoneal fluid. PLAN: The patient has previously been formally evaluated by the  [facility] Interventional Radiology Portal Hypertension Clinic and is being actively followed for potential future intervention. Read by Lavonia Drafts [facility] Electronically Signed   By: Lavonia Dana M.D.   On: 01/20/2022 12:34   US Paracentesis  Result Date: 01/18/2022 INDICATION: Cirrhosis, ascites EXAM: ULTRASOUND GUIDED DIAGNOSTIC AND THERAPEUTIC PARACENTESIS MEDICATIONS: None. COMPLICATIONS: None immediate. PROCEDURE: Informed written consent was obtained from the patient after a discussion of the risks, benefits and alternatives to treatment. A timeout was performed prior to the initiation of the procedure. Initial ultrasound scanning demonstrates a large amount of ascites within the right lower abdominal quadrant. The right lower abdomen was prepped and draped in the usual sterile fashion. 1% lidocaine was used for local anesthesia. Following this, a 5 [place] Yueh catheter was introduced. An ultrasound image was saved for documentation purposes. The paracentesis was performed. The catheter was removed and a dressing was applied. The patient tolerated the procedure well without immediate post procedural complication. FINDINGS: A total of approximately 4 L of cloudy light yellow fluid was  removed. Samples were sent to the laboratory as requested by the clinical team. IMPRESSION: Successful ultrasound-guided paracentesis yielding 4 liters of peritoneal fluid. Electronically Signed   By: Lavonia Dana M.D.   On: 01/18/2022 13:02    Treatments: IV hydration, procedures: TIPS  Discharge Exam: Blood pressure 93/79, pulse 88, temperature 97.9 F (36.6 C), temperature source Oral, resp. rate 15, height _0  (1.676 m), weight 177 lb 7.5 oz (80.5 kg), SpO2 100 %. Physical Exam Vitals and nursing note reviewed.  Constitutional:      General: He is not in acute distress.    Appearance: He is not ill-appearing.  HENT:     Head: Normocephalic.  Eyes:     General: No scleral icterus. Cardiovascular:     Rate and Rhythm: Normal rate and regular rhythm.     Comments: (+) Right IJ puncture site clean, dry, dressed appropriately. Bandage removed during exam today. Pulmonary:     Effort: Pulmonary effort is normal.     Breath sounds: Normal breath sounds.  Abdominal:     General: There is no distension.     Palpations: Abdomen is soft.     Tenderness: There is no abdominal tenderness.     Comments: (+) RUQ puncture site clean, dry, dressed appropriately. Bandage removed on exam today.  Skin:    Coloration: Skin is not jaundiced.  Neurological:     Mental Status: He is alert and oriented to person, place, and time.  Psychiatric:        Mood and Affect: Mood normal.        Thought Content: Thought content normal.        Judgment: Judgment normal.     Disposition:    Allergies as of 02/15/2022   No Known Allergies      Medication List     STOP taking these medications    B-12 PO       TAKE these medications    amLODipine 2.5 MG tablet Commonly known as: NORVASC Take 1 tablet (2.5 mg total) by mouth daily.   ascorbic acid 500 MG tablet Commonly known as: VITAMIN C Take 500 mg by mouth daily.   aspirin EC 81 MG tablet Take 81 mg by mouth daily. Swallow  whole.   blood glucose meter kit and supplies Dispense based on patient and insurance preference. Use to check sugars once per day. Dx.E11.9   blood glucose meter kit and supplies Kit Dispense  based on patient and insurance preference. Use daily as directed. (FOR ICD - 10 E11.9)   canagliflozin 100 MG Tabs tablet Commonly known as: Invokana Take 1 tablet (100 mg total) by mouth daily before breakfast.   Cholecalciferol 250 MCG (10000 UT) Caps Take 10,000 Units by mouth daily.   clonazePAM 0.5 MG tablet Commonly known as: KLONOPIN 1 pill taken 2 or 3 times a day for essential tremor   Fish Oil 1000 MG Caps Take 1,000 mg by mouth daily.   fluticasone 50 MCG/ACT nasal spray Commonly known as: FLONASE Place 2 sprays into both nostrils daily.   lactulose 10 GM/15ML solution Commonly known as: CHRONULAC Take 15 mLs (10 g total) by mouth 3 (three) times daily. What changed: when to take this   metFORMIN 500 MG tablet Commonly known as: GLUCOPHAGE 1 qd What changed:  how much to take how to take this when to take this additional instructions   metoprolol succinate 25 MG 24 hr tablet Commonly known as: TOPROL-XL Take 1 tablet (25 mg total) by mouth daily. with food   Milk Thistle 1000 MG Caps Take 1,000 mg by mouth daily.   ondansetron 4 MG tablet Commonly known as: Zofran Take 1 tablet (4 mg total) by mouth every 8 (eight) hours as needed for nausea or vomiting.   OneTouch Delica Plus [id number] Misc USE TO TEST BLOOD SUGAR DAILY   OneTouch Verio test strip Generic drug: glucose blood USE AS INSTRUCTED TO TEST BLOOD SUGAR DAILY   OVER THE COUNTER MEDICATION Take 1 tablet by mouth daily. Liver MD   pantoprazole 40 MG tablet Commonly known as: PROTONIX Take 1 tablet (40 mg total) by mouth daily.   potassium chloride SA 20 MEQ tablet Commonly known as: Klor-Con M20 TAKE 1 TABLET BY MOUTH EVERY DAY   rosuvastatin 20 MG tablet Commonly known as: Crestor Take 1  tablet (20 mg total) by mouth daily.   venlafaxine XR 150 MG 24 hr capsule Commonly known as: EFFEXOR-XR Take 1 capsule (150 mg total) by mouth 2 (two) times daily.   vitamin E 180 MG (400 UNITS) capsule Take 400 Units by mouth daily.   zinc gluconate 50 MG tablet Take 50 mg by mouth daily.        Follow-up Information     Suttle, Rosanne Ashing, MD Follow up.   Specialties: Interventional Radiology, Diagnostic Radiology, Radiology Why: IR scheduler will call you to set up follow up TIPS appointment in about a month. If you have questions or concerns please call [phone] M-F between 8a-5p or [phone] after hours and on weekends to be connected to an on call radiologist. Contact information: [address] [place] [address] [phone]                  Electronically Signed: Joaquim Nam, PA-C 02/15/2022, 9:30 AM   I have spent Greater Than 30 Minutes discharging [place].Marland Kitchen

## 2022-02-16 ENCOUNTER — Telehealth: Payer: Self-pay | Admitting: *Deleted

## 2022-02-16 NOTE — Patient Outreach (Signed)
  Care Coordination [facility] Note Transition Care Management Unsuccessful Follow-up Telephone Call  Date of discharge and from where:  [facility] on 02/15/22  Attempts:  1st Attempt  Reason for unsuccessful TCM follow-up call:  Left voice message  Chong Sicilian, BSN, RN-BC RN Care Coordinator [place]: [phone] Main #: [phone]

## 2022-02-17 ENCOUNTER — Telehealth: Payer: Self-pay | Admitting: *Deleted

## 2022-02-17 NOTE — Patient Outreach (Signed)
  Care Coordination [facility] Note Transition Care Management Unsuccessful Follow-up Telephone Call  Date of discharge and from where:  [facility] on 02/15/22  Attempts:  2nd Attempt  Reason for unsuccessful TCM follow-up call:  Left voice message  Chong Sicilian, BSN, RN-BC RN Care Coordinator [place]: [phone] Main #: [phone]

## 2022-02-18 ENCOUNTER — Encounter: Payer: Self-pay | Admitting: *Deleted

## 2022-02-18 ENCOUNTER — Telehealth: Payer: Self-pay | Admitting: *Deleted

## 2022-02-18 ENCOUNTER — Telehealth (HOSPITAL_COMMUNITY): Payer: Self-pay | Admitting: Interventional Radiology

## 2022-02-18 NOTE — Telephone Encounter (Signed)
   Portal Hypertension Clinic TIPS post-procedure phone call follow-up   Shawn [name]. is a 66 y.o. male who underwent TIPS creation at [facility] on 02/14/22.  He is feeling well with some mild nausea, which he states is improved from pre-procedure.  He is compliant with lactulose.  No encephalopathy.     Current diuretic regimen: none Current pharmacologic encephalopathy prophylaxis/treatment: lactulose 15 mg TID  Post-TIPS Imaging: None new.  Labs: None new.  Assessment: Shawn [name]. is a 66 y.o. male with history of NASH cirrhosis and recurrent ascites with hepatorenal syndrome limiting diuretics. Patient is 4 days from TIPS creation and paracentesis.  Recommendation: Continue current treatment plan with next phone call scheduled for 02/25/22.   Electronically Signed: Suzette Battiest, MD 02/18/2022, 4:00 PM

## 2022-02-18 NOTE — Patient Outreach (Signed)
Care Coordination [facility] Note Transition Care Management Follow-up Telephone Call Date of discharge and from where: [facility] on 02/15/2022 How have you been since you were released from the hospital? "Doing much better. I wasn't able to pick up my medicine for nausea." Denis vomiting. Nausea is better but would like to have med on hand just in case Any questions or concerns? Yes.unable to get Zofran from [facility] in [place]. Sounds like it needs a PA, but I'm unsure why. Sent message to [facility] clinical pool asking for assistance on PA or to prescribe zofran OTD which may be covered by ins without PA.  Items Reviewed: Did the pt receive and understand the discharge instructions provided? Yes  Medications obtained and verified? Yes . Except for Zofran. See above. Other? Yes . Patient was told to come to [facility] ED if necessary. Explained that [facility] will only transport out of the county for certain emergencies. Has to have permission to transfer to [facility]. Will need to ask them to contact superior for permission to transport to [facility]. Absolutely avoid [facility] b/c they will not transfer to [facility] and he will end up in [facility] and this isn't appropriate because his surgeon and IV are at [facility]. Forestine Na can transfer to [facility] but will require an additional [facility] ride. Can take private vehicle to [facility] if he doesn't feel it will impact his health. Confirmed that he has IV daytime and nighttime emergency numbers and strongly encouraged to reach out to them if needed. Discussed incision sites. No signs of infection or drainage.  Any new allergies since your discharge? No  Dietary orders reviewed? Yes Do you have support at home? Yes   Home Care and Equipment/Supplies: Were home health services ordered? no If so, what is the name of the agency?   Has the agency set up a time to come to the patient's home? not applicable Were any new equipment or medical supplies ordered?  No What is the name of the  medical supply agency?  Were you able to get the supplies/equipment? not applicable Do you have any questions related to the use of the equipment or supplies? No  Functional Questionnaire: (I = Independent and D = Dependent) ADLs: I  Bathing/Dressing- I  Meal Prep- I  Eating- I  Maintaining continence- I  Transferring/Ambulation- I  Managing Meds- I  Follow up appointments reviewed:  PCP Hospital f/u appt confirmed?  Not needed  Follow up scheduled with Dr Wolfgang Phoenix in Feb. He will keep this appt.  [facility] f/u appt confirmed?  Pending.  IV radiology group will outreach for appt in 1 month. Patient has contact info and will call them next week if he hasn't heard form them regarding scheduling.  Are transportation arrangements needed? No  If their condition worsens, is the pt aware to call PCP or go to the Emergency Dept.? Yes Was the patient provided with contact information for the PCP's office or ED? No Was to pt encouraged to call back with questions or concerns? Yes  SDOH assessments and interventions completed:   Yes SDOH Interventions Today    Flowsheet Row Most Recent Value  SDOH Interventions   Food Insecurity Interventions Intervention Not Indicated  Housing Interventions Intervention Not Indicated  Transportation Interventions Intervention Not Indicated       Care Coordination Interventions:  See above documentation regarding interventions associated with appts and medication Prior auth    Encounter Outcome:  Pt. Visit Completed    Chong Sicilian,  BSN, RN-BC RN Care Coordinator [place] Direct Dial: [phone] Main #: [phone]

## 2022-02-25 ENCOUNTER — Other Ambulatory Visit: Payer: Self-pay | Admitting: Family Medicine

## 2022-03-08 ENCOUNTER — Ambulatory Visit: Payer: PPO | Admitting: Internal Medicine

## 2022-03-09 ENCOUNTER — Ambulatory Visit (HOSPITAL_COMMUNITY)
Admission: RE | Admit: 2022-03-09 | Discharge: 2022-03-09 | Disposition: A | Discharge status: Home / Self Care | Payer: PPO | Source: Ambulatory Visit | Attending: Gastroenterology | Admitting: Gastroenterology

## 2022-03-09 DIAGNOSIS — R188 Other ascites: Secondary | ICD-10-CM | POA: Insufficient documentation

## 2022-03-09 DIAGNOSIS — K7460 Unspecified cirrhosis of liver: Secondary | ICD-10-CM | POA: Insufficient documentation

## 2022-03-09 LAB — GRAM STAIN

## 2022-03-09 LAB — BODY FLUID CELL COUNT WITH DIFFERENTIAL
Eos, Fluid: 0 %
Lymphs, Fluid: 34 %
Monocyte-Macrophage-Serous Fluid: 30 % — ABNORMAL LOW (ref 50–90)
Neutrophil Count, Fluid: 36 % — ABNORMAL HIGH (ref 0–25)
Total Nucleated Cell Count, Fluid: 187 cu mm (ref 0–1000)

## 2022-03-09 MED ORDER — ALBUMIN HUMAN 25 % IV SOLN: 0.0000 g | Freq: Once | INTRAVENOUS | Status: AC

## 2022-03-09 MED ORDER — ALBUMIN HUMAN 25 % IV SOLN
INTRAVENOUS | Status: AC
  Administered 2022-03-09: 25 g via INTRAVENOUS
  Filled 2022-03-09: qty 100

## 2022-03-09 NOTE — Procedures (Signed)
   US guided RLQ paracentesis  Post TIPs procedure 02/14/22 in IR- Dr Serafina Royals  4 liters  (max) cloudy yellow fluid Sent for labs per MD  Tolerated well  EBL: less than 1 cc

## 2022-03-09 NOTE — Progress Notes (Signed)
Paracentesis complete no signs of distress. 4L ascites removed.  

## 2022-03-10 ENCOUNTER — Other Ambulatory Visit: Payer: Self-pay

## 2022-03-10 DIAGNOSIS — K7460 Unspecified cirrhosis of liver: Secondary | ICD-10-CM

## 2022-03-10 LAB — PATHOLOGIST SMEAR REVIEW

## 2022-03-14 LAB — CULTURE, BODY FLUID W GRAM STAIN -BOTTLE: Culture: NO GROWTH

## 2022-03-18 ENCOUNTER — Telehealth: Payer: Self-pay | Admitting: *Deleted

## 2022-03-18 NOTE — Telephone Encounter (Signed)
Patient called had TIPS 02-14-2022. He is experiencing a swollen stomach, legs feet and ankles.   Per Dr Earleen [place] needs outpatient paracentesis.   However due to "leg swelling" he encourages him to go to ED get a duplex to rule out DVT.  Patient has been advised and understands.Cathren Harsh

## 2022-03-22 ENCOUNTER — Ambulatory Visit (HOSPITAL_COMMUNITY)
Admission: RE | Admit: 2022-03-22 | Discharge: 2022-03-22 | Disposition: A | Discharge status: Home / Self Care | Payer: PPO | Source: Ambulatory Visit | Attending: Gastroenterology | Admitting: Gastroenterology

## 2022-03-22 ENCOUNTER — Encounter (HOSPITAL_COMMUNITY): Payer: Self-pay

## 2022-03-22 DIAGNOSIS — K7460 Unspecified cirrhosis of liver: Secondary | ICD-10-CM | POA: Insufficient documentation

## 2022-03-22 DIAGNOSIS — R188 Other ascites: Secondary | ICD-10-CM | POA: Insufficient documentation

## 2022-03-22 LAB — BODY FLUID CELL COUNT WITH DIFFERENTIAL
Lymphs, Fluid: 34 %
Monocyte-Macrophage-Serous Fluid: 36 % — ABNORMAL LOW (ref 50–90)
Neutrophil Count, Fluid: 30 % — ABNORMAL HIGH (ref 0–25)
Total Nucleated Cell Count, Fluid: 160 cu mm (ref 0–1000)

## 2022-03-22 MED ORDER — ALBUMIN HUMAN 25 % IV SOLN: 25.0000 g | Freq: Once | INTRAVENOUS | Status: AC

## 2022-03-22 MED ORDER — ALBUMIN HUMAN 25 % IV SOLN
INTRAVENOUS | Status: AC
  Administered 2022-03-22: 25 g via INTRAVENOUS
  Filled 2022-03-22: qty 100

## 2022-03-22 NOTE — Procedures (Signed)
PreOperative Dx: Cirrhosis, ascites °Postoperative Dx: Cirrhosis, ascites °Procedure:   US guided paracentesis °Radiologist:  Aaliyan Brinkmeier °Anesthesia:  10 ml of1% lidocaine °Specimen:  4 L of yellow ascitic fluid °EBL:   < 1 ml °Complications: None   °

## 2022-03-22 NOTE — Progress Notes (Signed)
Patient tolerated right sided paracentesis procedure and 25G of IV albumin well today and 4 L of cloudy yellow ascites removed with labs collected and sent for processing. Pt verbalized understanding of discharge instructions and ambulatory at departure with no acute distress noted.

## 2022-03-22 NOTE — Addendum Note (Signed)
Addended by: Cheron Every on: 03/22/2022 10:21 AM   Modules accepted: Orders

## 2022-03-23 LAB — PATHOLOGIST SMEAR REVIEW

## 2022-03-24 ENCOUNTER — Ambulatory Visit
Admission: RE | Admit: 2022-03-24 | Discharge: 2022-03-24 | Disposition: A | Discharge status: Home / Self Care | Payer: PPO | Source: Ambulatory Visit | Attending: Physician Assistant | Admitting: Physician Assistant

## 2022-03-24 ENCOUNTER — Other Ambulatory Visit: Payer: Self-pay | Admitting: Interventional Radiology

## 2022-03-24 DIAGNOSIS — K766 Portal hypertension: Secondary | ICD-10-CM | POA: Diagnosis not present

## 2022-03-24 DIAGNOSIS — K7581 Nonalcoholic steatohepatitis (NASH): Secondary | ICD-10-CM

## 2022-03-24 DIAGNOSIS — Z9689 Presence of other specified functional implants: Secondary | ICD-10-CM | POA: Diagnosis not present

## 2022-03-24 DIAGNOSIS — Z95828 Presence of other vascular implants and grafts: Secondary | ICD-10-CM

## 2022-03-24 DIAGNOSIS — R188 Other ascites: Secondary | ICD-10-CM | POA: Diagnosis not present

## 2022-03-24 HISTORY — PX: IR RADIOLOGIST EVAL & MGMT: IMG5224

## 2022-03-24 NOTE — Progress Notes (Signed)
Referring Physician(s): Manus Rudd, MD  Reason for follow up:  Status post TIPS creation on 02/14/22  History of present illness: Shawn Norville. is a 67 y.o. male with history of NASH cirrhosis (Child Pugh B, MELD 15) with recurrent ascites and renal insufficiency preventing use of diuretics status post TIPS creation on 02/14/22.  He presents today via virtual telephone visit accompanied by his wife.  He reports doing about the same.  Since the TIPS, he has had 2 paracenteses, Jan 3 and Jan 16, with 4 L fluid each time.  He reports that his legs have been swollen which is his biggest complaint.  He is adherent to a low sodium diet.  He denies any encephalopathy and has remained compliant with lactulose, 2-3x per day with 2-4 bowel movements per day.      Past Medical History:  Diagnosis Date   ADD (attention deficit disorder)    ADHD    Anxiety 1987   mild (crowds, noises)   Anxiety    Attention deficit disorder    Cancer (HCC)    Cirrhosis of liver (HCC)    CVA (cerebral vascular accident) ([place])    Depression    Diabetic neuropathy ([place])    Essential hypertension, benign    Long-standing history, negative secondary workup   Headache    History of cardiac catheterization    Widely patent coronary and renal arteries November 2013   Hyperlipidemia    Hypertension    whitecoat   Obstructive sleep apnea    cpap   Prediabetes    Sleep apnea    TIA (transient ischemic attack)    Possible, 2007   Type 2 diabetes mellitus (HCC)    Venous stasis     Past Surgical History:  Procedure Laterality Date   ANTERIOR CERVICAL DECOMP/DISCECTOMY FUSION N/A 12/30/2015   Procedure: ANTERIOR CERVICAL DECOMPRESSION/DISCECTOMY FUSION C6 - C7 1 LEVEL;  Surgeon: Melina Schools, MD;  Location: [facility];  Service: Orthopedics;  Laterality: N/A;   CARDIAC CATHETERIZATION  01/2012   negative   CARPAL TUNNEL RELEASE     CERVICAL SPINE NERVE BLOCK  05/17/2017   IR INTRAVASCULAR ULTRASOUND NON  CORONARY  02/14/2022   IR PARACENTESIS  02/14/2022   IR RADIOLOGIST EVAL & MGMT  12/03/2021   IR RADIOLOGIST EVAL & MGMT  01/13/2022   IR TIPS  02/14/2022   IR US GUIDE VASC ACCESS RIGHT  02/14/2022   IR US GUIDE VASC ACCESS RIGHT  02/14/2022   KNEE SURGERY Right    NECK SURGERY     RADIOLOGY WITH ANESTHESIA N/A 02/14/2022   Procedure: TIPS;  Surgeon: Suzette Battiest, MD;  Location: [facility];  Service: Radiology;  Laterality: N/A;   VASECTOMY      Allergies: Patient has no known allergies.  Medications: Prior to Admission medications   Medication Sig Start Date End Date Taking? Authorizing Provider  amLODipine (NORVASC) 2.5 MG tablet TAKE 1 TABLET BY MOUTH EVERY DAY 02/25/22   [name], MD  ascorbic acid (VITAMIN C) 500 MG tablet Take 500 mg by mouth daily.    [provider]  aspirin EC 81 MG tablet Take 81 mg by mouth daily. Swallow whole.    [provider]  blood glucose meter kit and supplies KIT Dispense based on patient and insurance preference. Use daily as directed. (FOR ICD - 10 E11.9) 05/02/14   [name], MD  blood glucose meter kit and supplies Dispense based on patient and  insurance preference. Use to check sugars once per day. Dx.E11.9 06/30/21   [name], MD  canagliflozin (INVOKANA) 100 MG TABS tablet Take 1 tablet (100 mg total) by mouth daily before breakfast. Patient not taking: Reported on 02/11/2022 06/30/21   [name], MD  Cholecalciferol 250 MCG (10000 UT) CAPS Take 10,000 Units by mouth daily.    [provider]  clonazePAM (KLONOPIN) 0.5 MG tablet 1 pill taken 2 or 3 times a day for essential tremor Patient not taking: Reported on 02/11/2022 11/01/21   [name], MD  fluticasone (FLONASE) 50 MCG/ACT nasal spray Place 2 sprays into both nostrils daily. Patient not taking: Reported on 02/11/2022 11/29/21   [name], MD  glucose blood (ONETOUCH VERIO) test strip USE AS INSTRUCTED TO TEST BLOOD SUGAR DAILY  03/30/20   [name], MD  lactulose (CHRONULAC) 10 GM/15ML solution Take 15 mLs (10 g total) by mouth 3 (three) times daily. 02/15/22 02/15/23  Candiss Norse A, PA-C  Lancets (ONETOUCH DELICA PLUS [id number]) MISC USE TO TEST BLOOD SUGAR DAILY 10/11/21   [name], MD  metFORMIN (GLUCOPHAGE) 500 MG tablet 1 qd Patient taking differently: Take 500 mg by mouth daily with breakfast. 11/01/21   [name], MD  metoprolol succinate (TOPROL-XL) 25 MG 24 hr tablet Take 1 tablet (25 mg total) by mouth daily. with food 11/01/21   [name], MD  Milk Thistle 1000 MG CAPS Take 1,000 mg by mouth daily.    [provider]  Omega-3 Fatty Acids (FISH OIL) 1000 MG CAPS Take 1,000 mg by mouth daily.    [provider]  ondansetron (ZOFRAN) 4 MG tablet Take 1 tablet (4 mg total) by mouth every 8 (eight) hours as needed for nausea or vomiting. 02/15/22   Candiss Norse A, PA-C  OVER THE COUNTER MEDICATION Take 1 tablet by mouth daily. Liver MD    [provider]  pantoprazole (PROTONIX) 40 MG tablet Take 1 tablet (40 mg total) by mouth daily. 11/01/21   [name], MD  potassium chloride SA (KLOR-CON M20) 20 MEQ tablet TAKE 1 TABLET BY MOUTH EVERY DAY 02/25/22   [name], MD  rosuvastatin (CRESTOR) 20 MG tablet Take 1 tablet (20 mg total) by mouth daily. Patient not taking: Reported on 02/11/2022 12/30/20   [name], MD  venlafaxine XR (EFFEXOR-XR) 150 MG 24 hr capsule Take 1 capsule (150 mg total) by mouth 2 (two) times daily. 11/01/21   [name], MD  vitamin E 180 MG (400 UNITS) capsule Take 400 Units by mouth daily.    [provider]  zinc gluconate 50 MG tablet Take 50 mg by mouth daily.    [provider]     Family History  Problem Relation Age of Onset   Colon cancer Mother    Hypertension Mother    CAD Mother    Breast cancer Mother    Cirrhosis Brother     Social History   Socioeconomic History    Marital status: Married    Spouse name: Not on file   Number of children: 4   Years of education: Not on file   Highest education level: Not on file  Occupational History   Occupation: maintainer   Tobacco Use   Smoking status: Never   Smokeless tobacco: Current    Types: Snuff   Tobacco comments:    Uses dip pouches occasionally  Vaping Use  Vaping Use: Never used  Substance and Sexual Activity   Alcohol use: No    Alcohol/week: 0.0 standard drinks of alcohol    Comment: Previously 1-2 cases of beer/week. patient states never on regular basis though. last etoh in 2013.    Drug use: No   Sexual activity: Not Currently  Other Topics Concern   Not on file  Social History Narrative   ** Merged History Encounter **       Social Determinants of Health   Financial Resource Strain: Low Risk  (01/04/2022)   Overall Financial Resource Strain (CARDIA)    Difficulty of Paying Living Expenses: Not hard at all  Food Insecurity: No Food Insecurity (02/18/2022)   Hunger Vital Sign    Worried About Running Out of Food in the Last Year: Never true    Ran Out of Food in the Last Year: Never true  Transportation Needs: No Transportation Needs (02/18/2022)   PRAPARE - Hydrologist (Medical): No    Lack of Transportation (Non-Medical): No  Physical Activity: Inactive (01/04/2022)   Exercise Vital Sign    Days of Exercise per Week: 0 days    Minutes of Exercise per Session: 0 min  Stress: No Stress Concern Present (01/04/2022)   [place]    Feeling of Stress : Only a little  Social Connections: Moderately Integrated (01/04/2022)   Social Connection and Isolation Panel [NHANES]    Frequency of Communication with Friends and Family: More than three times a week    Frequency of Social Gatherings with Friends and Family: Once a week    Attends Religious Services: More than 4 times per year     Active Member of [employer] or Organizations: No    Attends Archivist Meetings: Never    Marital Status: Married     Vital Signs: There were no vitals taken for this visit.  No physical examination was performed in lieu of virtual telephone clinic visit.   Imaging:  7+2 Viatorr, no post deployment balloon molding. Gradient from 22-->9  Labs:  CBC: Recent Labs    10/18/21 1058 02/14/22 1027 02/15/22 0205  WBC 6.1 7.1 10.1  HGB 14.6 15.9 14.2  HCT 42.3 45.6 40.4  PLT 151 161 157    COAGS: Recent Labs    11/02/21 1055 11/25/21 1210 02/14/22 1027 02/15/22 0205  INR 1.2 1.2 1.2 1.5*    BMP: Recent Labs    11/24/21 1547 11/25/21 1210 02/14/22 1027 02/15/22 0205  NA 140 140 134* 135  K 4.3 4.3 5.4* 5.2*  CL 107* 107* 106 108  CO2 18* 20 16* 19*  GLUCOSE 150* 85 90 106*  BUN 36* 40* 74* 61*  CALCIUM 9.1 9.1 9.8 9.3  CREATININE 2.02* 2.04* 2.77* 2.49*  GFRNONAA  --   --  25* 28*    LIVER FUNCTION TESTS: Recent Labs    10/18/21 1105 11/02/21 1055 11/25/21 1210 02/15/22 0205  BILITOT 0.7 0.5 0.6 0.9  AST 37 34 36 71*  ALT '29 27 25 '$ 56*  ALKPHOS 123* 114 118 130*  PROT 7.1 6.5 6.4 5.7*  ALBUMIN 3.8* 3.5* 3.7* 2.6*    Assessment and Plan: Shawn Faivre. is a 67 y.o. male with history of NASH cirrhosis (Child Pugh B, MELD 15) with recurrent ascites and renal insufficiency preventing use of diuretics status post TIPS creation on 02/14/22. He is experiencing leg swelling post procedure and has  requiring 2 paracenteses.  I suspect he is experiencing post-TIPS third spacing in the lower extremities, which is not uncommon, and should auto-diurese in the next month or two.  We discussed potential TIPS check/revision if this does not improve.  Follow up in 1 month.  Ruthann Cancer, MD Pager: [phone] Clinic: [phone]    I spent a total of 25 Minutes in virtual telephone clinical consultation, greater than 50% of which was  counseling/coordinating care for portal hypertension.

## 2022-03-28 ENCOUNTER — Telehealth: Payer: Self-pay

## 2022-03-28 DIAGNOSIS — E875 Hyperkalemia: Secondary | ICD-10-CM | POA: Diagnosis not present

## 2022-03-28 DIAGNOSIS — E8722 Chronic metabolic acidosis: Secondary | ICD-10-CM | POA: Diagnosis not present

## 2022-03-28 DIAGNOSIS — N189 Chronic kidney disease, unspecified: Secondary | ICD-10-CM | POA: Diagnosis not present

## 2022-03-28 DIAGNOSIS — I5032 Chronic diastolic (congestive) heart failure: Secondary | ICD-10-CM | POA: Diagnosis not present

## 2022-03-28 DIAGNOSIS — E790 Hyperuricemia without signs of inflammatory arthritis and tophaceous disease: Secondary | ICD-10-CM | POA: Diagnosis not present

## 2022-03-28 DIAGNOSIS — E1122 Type 2 diabetes mellitus with diabetic chronic kidney disease: Secondary | ICD-10-CM | POA: Diagnosis not present

## 2022-03-28 NOTE — Telephone Encounter (Signed)
Pt called stating that he tried to make an appt for a para and they told him that the standing orders had been cancelled. Pt is requesting standing orders to be sent over so that he can have fluid drawn off. Routing to [place] in Dr. Roseanne Kaufman absence.

## 2022-03-28 NOTE — Telephone Encounter (Signed)
Noted  

## 2022-03-28 NOTE — Telephone Encounter (Signed)
There was one order still available. I was able to schedule him for tomorrow 04/29/22 at 1:15 pm. Pt was instructed to keep both follow up appointments with Dr.Rourk and IR.

## 2022-03-28 NOTE — Telephone Encounter (Signed)
Looks like there is still one order available. Can we find out what the issue is?  If no standing orders left, I will given one time order for this week and then can be reassessed at time of OV. Ok to draw 4 liters of fluid off. Given albumin 25%, 25 grams. Send fluid for cell count with diff. Don't send fluid for any other labs.   Patient has upcoming follow up with IR since TIPS placement and Dr. Gala Romney. Needs to keep both.

## 2022-03-29 ENCOUNTER — Encounter (HOSPITAL_COMMUNITY): Payer: Self-pay

## 2022-03-29 ENCOUNTER — Ambulatory Visit (HOSPITAL_COMMUNITY)
Admission: RE | Admit: 2022-03-29 | Discharge: 2022-03-29 | Disposition: A | Discharge status: Home / Self Care | Payer: PPO | Source: Ambulatory Visit | Attending: Gastroenterology | Admitting: Gastroenterology

## 2022-03-29 DIAGNOSIS — R188 Other ascites: Secondary | ICD-10-CM | POA: Insufficient documentation

## 2022-03-29 LAB — BODY FLUID CELL COUNT WITH DIFFERENTIAL
Eos, Fluid: 0 %
Lymphs, Fluid: 31 %
Monocyte-Macrophage-Serous Fluid: 51 % (ref 50–90)
Neutrophil Count, Fluid: 18 % (ref 0–25)
Total Nucleated Cell Count, Fluid: 121 cu mm (ref 0–1000)

## 2022-03-29 MED ORDER — ALBUMIN HUMAN 25 % IV SOLN
INTRAVENOUS | Status: AC
  Administered 2022-03-29: 25 g via INTRAVENOUS
  Filled 2022-03-29: qty 100

## 2022-03-29 MED ORDER — ALBUMIN HUMAN 25 % IV SOLN: 25.0000 g | Freq: Once | INTRAVENOUS | Status: AC

## 2022-03-29 NOTE — Progress Notes (Signed)
Paracentesis complete no signs of distress.  

## 2022-03-30 ENCOUNTER — Telehealth: Payer: Self-pay

## 2022-03-30 NOTE — Telephone Encounter (Signed)
Pt called requesting that standing orders for paracentesis be put in the system so that he doesn't have to wait to be able to schedule them.

## 2022-03-31 ENCOUNTER — Other Ambulatory Visit: Payer: Self-pay | Admitting: *Deleted

## 2022-03-31 DIAGNOSIS — K7460 Unspecified cirrhosis of liver: Secondary | ICD-10-CM

## 2022-03-31 NOTE — Telephone Encounter (Signed)
Pt informed and verbalized understanding

## 2022-03-31 NOTE — Telephone Encounter (Signed)
Standing order for paracentesis ordered and faxed to [facility].  Left message for pt to return call

## 2022-04-01 ENCOUNTER — Other Ambulatory Visit: Payer: Self-pay | Admitting: Family Medicine

## 2022-04-05 ENCOUNTER — Ambulatory Visit (INDEPENDENT_AMBULATORY_CARE_PROVIDER_SITE_OTHER): Payer: PPO | Admitting: Family Medicine

## 2022-04-05 VITALS — BP 110/82

## 2022-04-05 DIAGNOSIS — E1169 Type 2 diabetes mellitus with other specified complication: Secondary | ICD-10-CM | POA: Diagnosis not present

## 2022-04-05 DIAGNOSIS — E785 Hyperlipidemia, unspecified: Secondary | ICD-10-CM | POA: Diagnosis not present

## 2022-04-05 DIAGNOSIS — S5002XA Contusion of left elbow, initial encounter: Secondary | ICD-10-CM | POA: Diagnosis not present

## 2022-04-05 DIAGNOSIS — K7460 Unspecified cirrhosis of liver: Secondary | ICD-10-CM

## 2022-04-05 DIAGNOSIS — E119 Type 2 diabetes mellitus without complications: Secondary | ICD-10-CM

## 2022-04-05 DIAGNOSIS — I10 Essential (primary) hypertension: Secondary | ICD-10-CM | POA: Diagnosis not present

## 2022-04-05 DIAGNOSIS — T148XXA Other injury of unspecified body region, initial encounter: Secondary | ICD-10-CM

## 2022-04-05 NOTE — Progress Notes (Signed)
   Subjective:    Patient ID: Shawn [name]., male    DOB: 10-03-1955, 67 y.o.   MRN: [id number]  HPI  Patient arrived today with scraps on his left elbow and right hand from a fall at his home. Patient is shaky and seems a little confused today.   Patient stumbled took a fall landed on his elbow and hand denying hitting his head does have a significant skin scrape  Patient is having issues with indigestion burning, mostly when laying down and after eating.  Review of Systems     Objective:   Physical Exam Significant edema in both legs Also significant ascites abdomen soft yet tense Lungs clear no crackles heart regular Patient does have tremors bilateral He was taking clonazepam but he is no longer taking that        Assessment & Plan:  1. Skin abrasion Significant skin abrasion recommend nonstick dressing watch this closely Tdap recommended  2. Cirrhosis, non-alcoholic ([place]) Followed by gastroenterology very difficult case has recent TIPS procedure  3. Morbid obesity ([place]) Portion control regular physical activity  4. Type 2 diabetes mellitus without complication, without long-term current use of insulin (HCC) Continue current medication check A1c  5. Essential hypertension, benign Blood pressure reasonable  6. Hyperlipidemia associated with type 2 diabetes mellitus ([place]) Continue statin for now

## 2022-04-06 ENCOUNTER — Telehealth: Payer: Self-pay | Admitting: Family Medicine

## 2022-04-06 ENCOUNTER — Other Ambulatory Visit: Payer: Self-pay | Admitting: *Deleted

## 2022-04-06 MED ORDER — AMLODIPINE BESYLATE 2.5 MG PO TABS: 2.5000 mg | ORAL_TABLET | Freq: Every day | ORAL | 0 refills | Status: DC

## 2022-04-06 NOTE — Addendum Note (Signed)
Addended by: Vicente Males on: 04/06/2022 09:56 AM   Modules accepted: Orders

## 2022-04-06 NOTE — Telephone Encounter (Signed)
Nurses Patient does have a follow-up office visit coming up next week Please make sure that he brings all of his medicines to that visit If for some reason he cannot bring all of his medicines he needs to have a written list of every single medicine he is taking so that we can verify it making sure that our list reflects what he is taking

## 2022-04-06 NOTE — Progress Notes (Signed)
04/06/22 Left message to return call and also sent my chart message

## 2022-04-06 NOTE — Telephone Encounter (Signed)
Mychart message sent to patient.

## 2022-04-08 ENCOUNTER — Ambulatory Visit (HOSPITAL_COMMUNITY)
Admission: RE | Admit: 2022-04-08 | Discharge: 2022-04-08 | Disposition: A | Discharge status: Home / Self Care | Payer: Medicare Other | Source: Ambulatory Visit | Attending: Internal Medicine | Admitting: Internal Medicine

## 2022-04-08 ENCOUNTER — Encounter (HOSPITAL_COMMUNITY): Payer: Self-pay

## 2022-04-08 DIAGNOSIS — K7460 Unspecified cirrhosis of liver: Secondary | ICD-10-CM | POA: Diagnosis not present

## 2022-04-08 LAB — BODY FLUID CELL COUNT WITH DIFFERENTIAL
Eos, Fluid: 0 %
Lymphs, Fluid: 41 %
Monocyte-Macrophage-Serous Fluid: 20 % — ABNORMAL LOW (ref 50–90)
Neutrophil Count, Fluid: 39 % — ABNORMAL HIGH (ref 0–25)
Total Nucleated Cell Count, Fluid: 112 cu mm (ref 0–1000)

## 2022-04-08 MED ORDER — ALBUMIN HUMAN 25 % IV SOLN
INTRAVENOUS | Status: AC
  Administered 2022-04-08: 25 g via INTRAVENOUS
  Filled 2022-04-08: qty 100

## 2022-04-08 MED ORDER — ALBUMIN HUMAN 25 % IV SOLN
0.0000 g | Freq: Once | INTRAVENOUS | Status: AC
  Filled 2022-04-08: qty 400

## 2022-04-08 NOTE — Procedures (Signed)
PreOperative Dx: Cirrhosis, ascites °Postoperative Dx: Cirrhosis, ascites °Procedure:   US guided paracentesis °Radiologist:  Luis Nickles °Anesthesia:  10 ml of1% lidocaine °Specimen:  4 L of yellow ascitic fluid °EBL:   < 1 ml °Complications: None   °

## 2022-04-08 NOTE — Progress Notes (Signed)
Patient tolerated right sided paracentesis and 25 G of IV albumin well today and 4 Liters of clear yellow ascites removed with labs collected and sent for processing. Patient verbalized understanding of discharge instructions and ambulatory at departure with no acute distress noted.

## 2022-04-11 ENCOUNTER — Ambulatory Visit (HOSPITAL_COMMUNITY)
Admission: RE | Admit: 2022-04-11 | Discharge: 2022-04-11 | Disposition: A | Discharge status: Home / Self Care | Payer: Medicare Other | Source: Ambulatory Visit | Attending: Internal Medicine | Admitting: Internal Medicine

## 2022-04-11 ENCOUNTER — Encounter (HOSPITAL_COMMUNITY): Payer: Self-pay

## 2022-04-11 DIAGNOSIS — K7460 Unspecified cirrhosis of liver: Secondary | ICD-10-CM | POA: Insufficient documentation

## 2022-04-11 DIAGNOSIS — I10 Essential (primary) hypertension: Secondary | ICD-10-CM | POA: Diagnosis not present

## 2022-04-11 DIAGNOSIS — E119 Type 2 diabetes mellitus without complications: Secondary | ICD-10-CM | POA: Diagnosis not present

## 2022-04-11 DIAGNOSIS — E785 Hyperlipidemia, unspecified: Secondary | ICD-10-CM | POA: Diagnosis not present

## 2022-04-11 DIAGNOSIS — R188 Other ascites: Secondary | ICD-10-CM | POA: Diagnosis not present

## 2022-04-11 DIAGNOSIS — E1169 Type 2 diabetes mellitus with other specified complication: Secondary | ICD-10-CM | POA: Diagnosis not present

## 2022-04-11 LAB — BODY FLUID CELL COUNT WITH DIFFERENTIAL
Eos, Fluid: 0 %
Lymphs, Fluid: 36 %
Monocyte-Macrophage-Serous Fluid: 35 % — ABNORMAL LOW (ref 50–90)
Neutrophil Count, Fluid: 29 % — ABNORMAL HIGH (ref 0–25)
Total Nucleated Cell Count, Fluid: 140 cu mm (ref 0–1000)

## 2022-04-11 MED ORDER — ALBUMIN HUMAN 25 % IV SOLN
INTRAVENOUS | Status: AC
  Administered 2022-04-11: 25 g via INTRAVENOUS
  Filled 2022-04-11: qty 100

## 2022-04-11 MED ORDER — ALBUMIN HUMAN 25 % IV SOLN: 0.0000 g | Freq: Once | INTRAVENOUS | Status: AC

## 2022-04-11 NOTE — Procedures (Signed)
PreOperative Dx: Cirrhosis, ascites °Postoperative Dx: Cirrhosis, ascites °Procedure:   US guided paracentesis °Radiologist:  Maureen Delatte °Anesthesia:  10 ml of1% lidocaine °Specimen:  4 L of yellow ascitic fluid °EBL:   < 1 ml °Complications: None   °

## 2022-04-11 NOTE — Progress Notes (Signed)
Patient tolerated right sided paracentesis procedure and 25G of IV albumin well today and 4 Liters of ascites removed with labs collected and sent for processing. PT verbalized understanding of discharge instructions and ambulatory at departure with no acute distress noted.

## 2022-04-12 ENCOUNTER — Encounter: Payer: Self-pay | Admitting: Internal Medicine

## 2022-04-12 ENCOUNTER — Ambulatory Visit (INDEPENDENT_AMBULATORY_CARE_PROVIDER_SITE_OTHER): Payer: Self-pay | Admitting: Internal Medicine

## 2022-04-12 VITALS — BP 113/72 | HR 86 | Temp 98.0°F | Ht 65.0 in | Wt 226.2 lb

## 2022-04-12 DIAGNOSIS — F09 Unspecified mental disorder due to known physiological condition: Secondary | ICD-10-CM

## 2022-04-12 DIAGNOSIS — K7460 Unspecified cirrhosis of liver: Secondary | ICD-10-CM

## 2022-04-12 DIAGNOSIS — K7682 Hepatic encephalopathy: Secondary | ICD-10-CM

## 2022-04-12 DIAGNOSIS — R188 Other ascites: Secondary | ICD-10-CM

## 2022-04-12 LAB — HEPATIC FUNCTION PANEL
ALT: 30 IU/L (ref 0–44)
AST: 53 IU/L — ABNORMAL HIGH (ref 0–40)
Albumin: 2.3 g/dL — ABNORMAL LOW (ref 3.9–4.9)
Alkaline Phosphatase: 169 IU/L — ABNORMAL HIGH (ref 44–121)
Bilirubin Total: 1.3 mg/dL — ABNORMAL HIGH (ref 0.0–1.2)
Bilirubin, Direct: 0.49 mg/dL — ABNORMAL HIGH (ref 0.00–0.40)
Total Protein: 5.2 g/dL — ABNORMAL LOW (ref 6.0–8.5)

## 2022-04-12 LAB — LIPID PANEL
Chol/HDL Ratio: 4 ratio (ref 0.0–5.0)
Cholesterol, Total: 187 mg/dL (ref 100–199)
HDL: 47 mg/dL (ref >39)
LDL Chol Calc (NIH): 117 mg/dL — ABNORMAL HIGH (ref 0–99)
Triglycerides: 131 mg/dL (ref 0–149)
VLDL Cholesterol Cal: 23 mg/dL (ref 5–40)

## 2022-04-12 LAB — HEMOGLOBIN A1C
Est. average glucose Bld gHb Est-mCnc: 85 mg/dL
Hgb A1c MFr Bld: 4.6 % — ABNORMAL LOW (ref 4.8–5.6)

## 2022-04-12 LAB — BASIC METABOLIC PANEL
BUN/Creatinine Ratio: 14 (ref 10–24)
BUN: 20 mg/dL (ref 8–27)
CO2: 22 mmol/L (ref 20–29)
Calcium: 8.7 mg/dL (ref 8.6–10.2)
Chloride: 108 mmol/L — ABNORMAL HIGH (ref 96–106)
Creatinine, Ser: 1.47 mg/dL — ABNORMAL HIGH (ref 0.76–1.27)
Glucose: 135 mg/dL — ABNORMAL HIGH (ref 70–99)
Potassium: 3.8 mmol/L (ref 3.5–5.2)
Sodium: 142 mmol/L (ref 134–144)
eGFR: 53 mL/min/{1.73_m2} — ABNORMAL LOW (ref >59)

## 2022-04-12 LAB — CBC WITH DIFFERENTIAL/PLATELET
Basophils Absolute: 0.1 10*3/uL (ref 0.0–0.2)
Basos: 2 %
EOS (ABSOLUTE): 0.5 10*3/uL — ABNORMAL HIGH (ref 0.0–0.4)
Eos: 9 %
Hematocrit: 30.9 % — ABNORMAL LOW (ref 37.5–51.0)
Hemoglobin: 10.8 g/dL — ABNORMAL LOW (ref 13.0–17.7)
Immature Grans (Abs): 0 10*3/uL (ref 0.0–0.1)
Immature Granulocytes: 0 %
Lymphocytes Absolute: 0.7 10*3/uL (ref 0.7–3.1)
Lymphs: 13 %
MCH: 32 pg (ref 26.6–33.0)
MCHC: 35 g/dL (ref 31.5–35.7)
MCV: 92 fL (ref 79–97)
Monocytes Absolute: 0.6 10*3/uL (ref 0.1–0.9)
Monocytes: 12 %
Neutrophils Absolute: 3.3 10*3/uL (ref 1.4–7.0)
Neutrophils: 64 %
Platelets: 160 10*3/uL (ref 150–450)
RBC: 3.37 x10E6/uL — ABNORMAL LOW (ref 4.14–5.80)
RDW: 14.6 % (ref 11.6–15.4)
WBC: 5.2 10*3/uL (ref 3.4–10.8)

## 2022-04-12 LAB — COMPREHENSIVE METABOLIC PANEL
ALT: 31 IU/L (ref 0–44)
AST: 54 IU/L — ABNORMAL HIGH (ref 0–40)
Albumin/Globulin Ratio: 0.9 — ABNORMAL LOW (ref 1.2–2.2)
Albumin: 2.4 g/dL — ABNORMAL LOW (ref 3.9–4.9)
Alkaline Phosphatase: 162 IU/L — ABNORMAL HIGH (ref 44–121)
BUN/Creatinine Ratio: 12 (ref 10–24)
BUN: 18 mg/dL (ref 8–27)
Bilirubin Total: 1.3 mg/dL — ABNORMAL HIGH (ref 0.0–1.2)
CO2: 23 mmol/L (ref 20–29)
Calcium: 8.7 mg/dL (ref 8.6–10.2)
Chloride: 108 mmol/L — ABNORMAL HIGH (ref 96–106)
Creatinine, Ser: 1.47 mg/dL — ABNORMAL HIGH (ref 0.76–1.27)
Globulin, Total: 2.8 g/dL (ref 1.5–4.5)
Glucose: 130 mg/dL — ABNORMAL HIGH (ref 70–99)
Potassium: 3.8 mmol/L (ref 3.5–5.2)
Sodium: 142 mmol/L (ref 134–144)
Total Protein: 5.2 g/dL — ABNORMAL LOW (ref 6.0–8.5)
eGFR: 53 mL/min/{1.73_m2} — ABNORMAL LOW (ref >59)

## 2022-04-12 LAB — PROTIME-INR
INR: 1.3 — ABNORMAL HIGH (ref 0.9–1.2)
Prothrombin Time: 13.4 s — ABNORMAL HIGH (ref 9.1–12.0)

## 2022-04-12 NOTE — Patient Instructions (Signed)
It was good to see you again today!  Information on cirrhotic/protein fortified diet provided  Continue with 2 g sodium limitation daily appointment with interventional radiologist next week to reassess TIPS patency  Continue lactulose 2 tablespoons daily-shoot for about 3 semiformed bowel movements daily going a day without a bowel movement   begin Xifaxan 550 mg twice daily-dispense 60 with 5 refills.  Schedule a large-volume paracentesis-limit to 4 L Friday, February 9 with albumin protocol   continue Protonix 40 mg daily for acid reflux   EGD and colonoscopy recommended but will need to hold off until after your TIPS has been assessed   keep your future appointment with [facility] in [place]   office visit here with Aliene Altes in 3 weeks  discussed the insidious nature of hepatic encephalopathy with patient and son-in-law [name] today.  I strongly recommend against driving operating machinery and having access to firearms at home.

## 2022-04-12 NOTE — Progress Notes (Signed)
Primary Care Physician:  [name], MD Primary Gastroenterologist:  Dr. Gala Romney  Pre-Procedure History & Physical: HPI:  Shawn [name]. is a 67 y.o. male here for follow-up of decompensated Nash/Nash cirrhosis.  TIPS placement 12/13.  He has unfortunately required 5 paracentesis since that time.  TIPS reassessment planned for next week.   rapidly reaccumulate's ascites after each tap.  Tries to stay adherent to a 2 g sodium diet.    Patient intolerant of diuretic therapy as prior administration causes of bump in creatinine.    So far, no SBP EGD and colonoscopy recommended previously; anasarca has delayed evaluation.  GERD well-controlled on Protonix 40 mg daily.  Past Medical History:  Diagnosis Date   ADD (attention deficit disorder)    ADHD    Anxiety 1987   mild (crowds, noises)   Anxiety    Attention deficit disorder    Cancer (HCC)    Cirrhosis of liver (HCC)    CVA (cerebral vascular accident) ([place])    Depression    Diabetic neuropathy ([place])    Essential hypertension, benign    Long-standing history, negative secondary workup   Headache    History of cardiac catheterization    Widely patent coronary and renal arteries November 2013   Hyperlipidemia    Hypertension    whitecoat   Obstructive sleep apnea    cpap   Prediabetes    Sleep apnea    TIA (transient ischemic attack)    Possible, 2007   Type 2 diabetes mellitus (HCC)    Venous stasis     Past Surgical History:  Procedure Laterality Date   ANTERIOR CERVICAL DECOMP/DISCECTOMY FUSION N/A 12/30/2015   Procedure: ANTERIOR CERVICAL DECOMPRESSION/DISCECTOMY FUSION C6 - C7 1 LEVEL;  Surgeon: Melina Schools, MD;  Location: [facility];  Service: Orthopedics;  Laterality: N/A;   CARDIAC CATHETERIZATION  01/2012   negative   CARPAL TUNNEL RELEASE     CERVICAL SPINE NERVE BLOCK  05/17/2017   IR INTRAVASCULAR ULTRASOUND NON CORONARY  02/14/2022   IR PARACENTESIS  02/14/2022   IR RADIOLOGIST EVAL & MGMT   12/03/2021   IR RADIOLOGIST EVAL & MGMT  01/13/2022   IR RADIOLOGIST EVAL & MGMT  03/24/2022   IR TIPS  02/14/2022   IR US GUIDE VASC ACCESS RIGHT  02/14/2022   IR US GUIDE VASC ACCESS RIGHT  02/14/2022   KNEE SURGERY Right    NECK SURGERY     RADIOLOGY WITH ANESTHESIA N/A 02/14/2022   Procedure: TIPS;  Surgeon: Suzette Battiest, MD;  Location: [facility];  Service: Radiology;  Laterality: N/A;   VASECTOMY      Prior to Admission medications   Medication Sig Start Date End Date Taking? Authorizing Provider  amLODipine (NORVASC) 2.5 MG tablet Take 1 tablet (2.5 mg total) by mouth daily. 04/06/22  Yes Luking, Elayne Snare, MD  fluticasone (FLONASE) 50 MCG/ACT nasal spray Place 2 sprays into both nostrils daily. 11/29/21  Yes Luking, Elayne Snare, MD  lactulose (CHRONULAC) 10 GM/15ML solution Take 15 mLs (10 g total) by mouth 3 (three) times daily. 02/15/22 02/15/23 Yes Candiss Norse A, PA-C  metFORMIN (GLUCOPHAGE) 500 MG tablet 1 qd Patient taking differently: Take 500 mg by mouth daily with breakfast. 11/01/21  Yes Luking, Elayne Snare, MD  pantoprazole (PROTONIX) 40 MG tablet Take 1 tablet (40 mg total) by mouth daily. 11/01/21  Yes [name], MD  venlafaxine XR (EFFEXOR-XR) 150 MG 24 hr capsule Take 1 capsule (  150 mg total) by mouth 2 (two) times daily. 11/01/21  Yes [name], MD    Allergies as of 04/12/2022   (No Known Allergies)    Family History  Problem Relation Age of Onset   Colon cancer Mother    Hypertension Mother    CAD Mother    Breast cancer Mother    Cirrhosis Brother     Social History   Socioeconomic History   Marital status: Married    Spouse name: Not on file   Number of children: 4   Years of education: Not on file   Highest education level: Not on file  Occupational History   Occupation: Barrister's clerk   Tobacco Use   Smoking status: Never   Smokeless tobacco: Current    Types: Snuff   Tobacco comments:    Uses dip pouches occasionally  Vaping Use   Vaping  Use: Never used  Substance and Sexual Activity   Alcohol use: No    Alcohol/week: 0.0 standard drinks of alcohol    Comment: Previously 1-2 cases of beer/week. patient states never on regular basis though. last etoh in 2013.    Drug use: No   Sexual activity: Not Currently  Other Topics Concern   Not on file  Social History Narrative   ** Merged History Encounter **       Social Determinants of Health   Financial Resource Strain: Low Risk  (01/04/2022)   Overall Financial Resource Strain (CARDIA)    Difficulty of Paying Living Expenses: Not hard at all  Food Insecurity: No Food Insecurity (02/18/2022)   Hunger Vital Sign    Worried About Running Out of Food in the Last Year: Never true    Ran Out of Food in the Last Year: Never true  Transportation Needs: No Transportation Needs (02/18/2022)   PRAPARE - Hydrologist (Medical): No    Lack of Transportation (Non-Medical): No  Physical Activity: Inactive (01/04/2022)   Exercise Vital Sign    Days of Exercise per Week: 0 days    Minutes of Exercise per Session: 0 min  Stress: No Stress Concern Present (01/04/2022)   [place]    Feeling of Stress : Only a little  Social Connections: Moderately Integrated (01/04/2022)   Social Connection and Isolation Panel [NHANES]    Frequency of Communication with Friends and Family: More than three times a week    Frequency of Social Gatherings with Friends and Family: Once a week    Attends Religious Services: More than 4 times per year    Active Member of [employer] or Organizations: No    Attends Archivist Meetings: Never    Marital Status: Married  Human resources officer Violence: Not At Risk (02/14/2022)   Humiliation, Afraid, Rape, and Kick questionnaire    Fear of Current or Ex-Partner: No    Emotionally Abused: No    Physically Abused: No    Sexually Abused: No    Review of  Systems: See HPI, otherwise negative ROS  Physical Exam: BP 113/72 (BP Location: Left Arm, Patient Position: Sitting, Cuff Size: Large)   Pulse 86   Temp 98 F (36.7 C) (Oral)   Ht 5' 5"$  (1.651 m)   Wt 226 lb 3.2 oz (102.6 kg)   SpO2 98%   BMI 37.64 kg/m  General:    he is accompanied by his son-in-law.;    Some word finding difficulties pleasant and  cooperative in NAD.  ++flap Neck:  Supple; no masses or thyromegaly. No significant cervical adenopathy. Lungs:  Clear throughout to auscultation.   No wheezes, crackles, or rhonchi. No acute distress. Heart:  Regular rate and rhythm; no murmurs, clicks, rubs,  or gallops. Abdomen:   Moderately distended.  Soft and nontender Pulses:  Normal pulses noted. Extremities:    1-2+ lower extremity edema  Impression/Plan:   67 year old gentleman with decompensated Nash/Ash cirrhosis and TIPS placement 12/13.  Unfortunately, rapid reaccumulation of ascitic fluid. I suspect TIPS malfunction. Intolerant of diuretic therapy.  Recurrent hepatic encephalopathy.    Recommendations:  Information on cirrhotic/protein fortified diet provided  Continue with 2 g sodium limitation daily   Keep appointment with interventional radiologist next week to reassess TIPS patency  Continue lactulose 2 tablespoons daily-shoot for about 3 semiformed bowel movements daily going a day without a bowel movement  begin Xifaxan 550 mg twice daily-dispense 60 with 5 refills.  Schedule a large-volume paracentesis-limit to 4 L Friday, February 9 with albumin protocol   continue Protonix 40 mg daily for acid reflux  EGD and colonoscopy recommended but will need to hold off until after  TIPS has been assessed  keep future appointment with [facility] in [place]  office visit here with Aliene Altes in 3 weeks  discussed the insidious nature of hepatic encephalopathy with patient and son-in-law Shawn [name] today.  I strongly recommend  against driving operating machinery and having access to firearms at home.         Notice: This dictation was prepared with Dragon dictation along with smaller phrase technology. Any transcriptional errors that result from this process are unintentional and may not be corrected upon review.

## 2022-04-13 ENCOUNTER — Other Ambulatory Visit: Payer: Self-pay

## 2022-04-13 ENCOUNTER — Telehealth: Payer: Self-pay

## 2022-04-13 ENCOUNTER — Other Ambulatory Visit: Payer: Self-pay | Admitting: Internal Medicine

## 2022-04-13 MED ORDER — RIFAXIMIN 550 MG PO TABS: 550.0000 mg | ORAL_TABLET | Freq: Two times a day (BID) | ORAL | 11 refills | Status: DC

## 2022-04-13 NOTE — Telephone Encounter (Signed)
Wife states that the pharmacy didn't have the [place] ready but that she is planning on starting it this afternoon once it is picked up. She states that he has been to the bathroom twice and is somewhat better.

## 2022-04-13 NOTE — Telephone Encounter (Signed)
Wife was made aware and verbalized understanding. Instructed wife to have pt bring income information to the office to submit for patient assistance.

## 2022-04-13 NOTE — Telephone Encounter (Signed)
Left message on voicemail to bring all medications to office visit tomorrow

## 2022-04-13 NOTE — Telephone Encounter (Signed)
Pt's wife called back and stated that the pharmacy told her that it was going to cost $1000 dollars a month. Pt is unable to afford it. I told her that we could submit for patient assistance for it. Pt will need to bring financial information to the office for it to be submitted, along with filling the application out. We have a 15 day supply of samples here at the office.

## 2022-04-13 NOTE — Telephone Encounter (Signed)
Pt's wife called stating that the pt is very confused and is wanting to know if she should take him to the ER to be checked out or if she should just continue to give him the lactulose. Please advise.

## 2022-04-14 ENCOUNTER — Other Ambulatory Visit: Payer: Self-pay | Admitting: Internal Medicine

## 2022-04-14 ENCOUNTER — Ambulatory Visit (INDEPENDENT_AMBULATORY_CARE_PROVIDER_SITE_OTHER): Payer: Medicare Other | Admitting: Family Medicine

## 2022-04-14 VITALS — BP 116/72 | Wt 227.6 lb

## 2022-04-14 DIAGNOSIS — S51012D Laceration without foreign body of left elbow, subsequent encounter: Secondary | ICD-10-CM

## 2022-04-14 DIAGNOSIS — F321 Major depressive disorder, single episode, moderate: Secondary | ICD-10-CM | POA: Insufficient documentation

## 2022-04-14 DIAGNOSIS — Z23 Encounter for immunization: Secondary | ICD-10-CM

## 2022-04-14 DIAGNOSIS — D696 Thrombocytopenia, unspecified: Secondary | ICD-10-CM | POA: Diagnosis not present

## 2022-04-14 DIAGNOSIS — N1831 Chronic kidney disease, stage 3a: Secondary | ICD-10-CM | POA: Diagnosis not present

## 2022-04-14 NOTE — Progress Notes (Signed)
   Subjective:    Patient ID: Shawn Greaves., male    DOB: 06-19-1955, 67 y.o.   MRN: [id number]  HPI Very nice patient Presents a very difficult issue with his end-stage liver disease He is being evaluated for the possibility of a transplant He also has had TIPS procedure but they are getting that reevaluated because it does not seem to be working as good as it should He has constant edema in the legs he sees GI on a regular basis sees nephrology on a regular basis At times his mental alertness is decreased because of his significant cirrhosis and hepatic encephalopathy.  His had multiple medicines stopped recently.  He is on multiple supplements currently we will review these with gastroenterology as well as nephrology because some of those may need to be stopped as well His moods seem to be hanging in there.  It has been a very difficult situation for family.  His long-term prognosis does not look good without transplant.  Currently right now family is managing at home without extra assistance Patient arrives for 3 month follow up. Patient has had some confusion but patient is improving since starting back taking Lactulose. Patient would like to have Tdap as well.     Review of Systems     Objective:   Physical Exam He does have a skin tear left arm Lungs clear heart regular Ascites is noted Mild jaundice noted Extremities with significant edema His wife is with him today He is in reasonable mood  Recent office notes from nephrology and GI reviewed recent lab work reviewed     Assessment & Plan:   Very nice patient Has a tough diagnosis Major depression continue Effexor Follow-up here every 3 months Ascites with end-stage cirrhosis hopefully will get a transplant Thrombocytopenia monitor stay away from NSAIDs CKD followed by nephrology We will review the supplements he is taking with GI as well as nephrology We are here for the patient if he needs a sooner follow-up  every 3 months

## 2022-04-14 NOTE — Patient Instructions (Signed)

## 2022-04-15 ENCOUNTER — Other Ambulatory Visit: Payer: Self-pay

## 2022-04-15 ENCOUNTER — Telehealth: Payer: Self-pay

## 2022-04-15 ENCOUNTER — Ambulatory Visit (HOSPITAL_COMMUNITY)
Admission: RE | Admit: 2022-04-15 | Discharge: 2022-04-15 | Disposition: A | Discharge status: Home / Self Care | Payer: Medicare Other | Source: Ambulatory Visit | Attending: Internal Medicine | Admitting: Internal Medicine

## 2022-04-15 ENCOUNTER — Encounter (HOSPITAL_COMMUNITY): Payer: Self-pay

## 2022-04-15 VITALS — BP 122/72 | HR 89 | Temp 98.1°F | Resp 18

## 2022-04-15 DIAGNOSIS — R188 Other ascites: Secondary | ICD-10-CM | POA: Diagnosis not present

## 2022-04-15 DIAGNOSIS — K7460 Unspecified cirrhosis of liver: Secondary | ICD-10-CM

## 2022-04-15 LAB — BODY FLUID CELL COUNT WITH DIFFERENTIAL
Eos, Fluid: 0 %
Lymphs, Fluid: 22 %
Monocyte-Macrophage-Serous Fluid: 65 % (ref 50–90)
Neutrophil Count, Fluid: 13 % (ref 0–25)
Total Nucleated Cell Count, Fluid: 113 cu mm (ref 0–1000)

## 2022-04-15 MED ORDER — ALBUMIN HUMAN 25 % IV SOLN
INTRAVENOUS | Status: AC
  Administered 2022-04-15: 25 g
  Filled 2022-04-15: qty 100

## 2022-04-15 NOTE — Progress Notes (Signed)
Patient tolerated left sided paracentesis and 25G of IV albumin well today and 4 Liters of ascites removed with labs collected and sent for processing. Pt verbalized understanding of discharge instructions and ambulatory at departure with wife with no acute distress noted.

## 2022-04-15 NOTE — Telephone Encounter (Signed)
Please advise Dr. Gala Romney

## 2022-04-15 NOTE — Procedures (Signed)
PreOperative Dx: Cirrhosis, ascites Postoperative Dx: Cirrhosis, ascites Procedure:   US guided paracentesis Radiologist:  Thornton Papas Anesthesia:  10 ml of1% lidocaine Specimen:  4 L of slightly cloudy yellow ascitic fluid EBL:   < 1 ml Complications:  None

## 2022-04-15 NOTE — Telephone Encounter (Signed)
Collette from [facility] sent the following message.   Hi Jerret Mcbane we are just finishing up the paracentesis for Shawn [name] and he has tolerated very well but still has a lot of fluid. Would you see if we can get some more orders entered for him - we have been draining his fluid 2x per week and would like to get him set up for Monday or Tuesday. Thanks! Colette

## 2022-04-17 ENCOUNTER — Encounter: Payer: Self-pay | Admitting: Family Medicine

## 2022-04-17 NOTE — Telephone Encounter (Signed)
Nurses-please connect with [facility].  Based upon what I know he is no longer taking the potassium.  That was according to the most recent visit he had with his wife here

## 2022-04-18 ENCOUNTER — Other Ambulatory Visit (INDEPENDENT_AMBULATORY_CARE_PROVIDER_SITE_OTHER): Payer: Self-pay | Admitting: *Deleted

## 2022-04-18 ENCOUNTER — Ambulatory Visit (INDEPENDENT_AMBULATORY_CARE_PROVIDER_SITE_OTHER): Payer: Medicare Other | Admitting: Family Medicine

## 2022-04-18 VITALS — BP 98/60 | HR 79 | Wt 220.6 lb

## 2022-04-18 DIAGNOSIS — N1832 Chronic kidney disease, stage 3b: Secondary | ICD-10-CM

## 2022-04-18 DIAGNOSIS — E1122 Type 2 diabetes mellitus with diabetic chronic kidney disease: Secondary | ICD-10-CM

## 2022-04-18 DIAGNOSIS — I951 Orthostatic hypotension: Secondary | ICD-10-CM

## 2022-04-18 DIAGNOSIS — R410 Disorientation, unspecified: Secondary | ICD-10-CM

## 2022-04-18 DIAGNOSIS — R188 Other ascites: Secondary | ICD-10-CM

## 2022-04-18 DIAGNOSIS — R300 Dysuria: Secondary | ICD-10-CM

## 2022-04-18 NOTE — Progress Notes (Signed)
   Subjective:    Patient ID: Shawn [name]., male    DOB: 05-Jun-1955, 67 y.o.   MRN: [id number]  HPI  Patient arrives today to follow up on confusion spell over the weekend and swelling in both legs. Patient has had some intermittent confusion over the weekend.  No shortness of breath no chest pain no fever or chills.  No vomiting.  Bowel movements did better yesterday and his confusion did improve He is on medications to help with cirrhosis They are also doing paracentesis once or twice a week Blood pressure a little bit on the low end when he came into the office Patient was concerned that it could be a UTI because he always her that urinary tract infections cause confusion   Review of Systems     Objective:   Physical Exam  Pedal edema is noted fairly significant bilateral Ascites is noted Lungs clear heart regular Pulse normal  Blood pressure sitting and standing-sitting-110/70 Standing 94/68    Assessment & Plan:  Relative orthostasis Stop amlodipine Follow-up closely Patient has appointment with gastroenterology to follow his ascites I doubt that the patient has a UTI but we will check the urine Unable to give a urine Wife will bring urine in the morning I did contact gastroenterology so they can help schedule his paracentesis More than likely the confusion is related to his ascites but he is at his baseline currently so therefore we will not do ammonia level currently  As for her diabetes under decent control currently

## 2022-04-18 NOTE — Telephone Encounter (Signed)
How many orders do you want [place] to enter for pt?

## 2022-04-18 NOTE — Telephone Encounter (Signed)
Nurses With the patient having swelling in the legs as well as confusion as well as possible UTI at the very least he needs to be seen this afternoon he could come approximately 330 and we could work him into the schedule-ideally we would like to see a urine specimen he may or may not need additional testing or blood work thanks

## 2022-04-19 ENCOUNTER — Telehealth (INDEPENDENT_AMBULATORY_CARE_PROVIDER_SITE_OTHER): Payer: Medicare Other

## 2022-04-19 DIAGNOSIS — R300 Dysuria: Secondary | ICD-10-CM | POA: Diagnosis not present

## 2022-04-19 LAB — POCT URINALYSIS DIP (CLINITEK)
Spec Grav, UA: 1.02 (ref 1.010–1.025)
pH, UA: 6 (ref 5.0–8.0)

## 2022-04-19 NOTE — Telephone Encounter (Signed)
Urine dipped and spun. Please advise. Thank you  Results for orders placed or performed in visit on 04/19/22  POCT URINALYSIS DIP (CLINITEK)  Result Value Ref Range   Color, UA     Clarity, UA     Glucose, UA     Bilirubin, UA     Ketones, POC UA trace (5) (A) negative mg/dL   Spec Grav, UA 1.020 1.010 - 1.025   Blood, UA     pH, UA 6.0 5.0 - 8.0   POC PROTEIN,UA trace negative, trace   Urobilinogen, UA     Nitrite, UA     Leukocytes, UA

## 2022-04-19 NOTE — Telephone Encounter (Signed)
Nurses Please send for culture Please let family know that there is no sign of urinary tract infection We will culture urine

## 2022-04-19 NOTE — Telephone Encounter (Signed)
Pt dropped or urine sample placed at lab

## 2022-04-20 ENCOUNTER — Other Ambulatory Visit: Payer: Self-pay | Admitting: Interventional Radiology

## 2022-04-20 ENCOUNTER — Ambulatory Visit (HOSPITAL_COMMUNITY)
Admission: RE | Admit: 2022-04-20 | Discharge: 2022-04-20 | Disposition: A | Discharge status: Home / Self Care | Payer: Medicare Other | Source: Ambulatory Visit | Attending: Internal Medicine | Admitting: Internal Medicine

## 2022-04-20 ENCOUNTER — Ambulatory Visit
Admission: RE | Admit: 2022-04-20 | Discharge: 2022-04-20 | Disposition: A | Discharge status: Home / Self Care | Payer: Medicare Other | Source: Ambulatory Visit | Attending: Interventional Radiology | Admitting: Interventional Radiology

## 2022-04-20 ENCOUNTER — Other Ambulatory Visit (HOSPITAL_COMMUNITY): Payer: Self-pay | Admitting: Interventional Radiology

## 2022-04-20 DIAGNOSIS — K7581 Nonalcoholic steatohepatitis (NASH): Secondary | ICD-10-CM

## 2022-04-20 DIAGNOSIS — K766 Portal hypertension: Secondary | ICD-10-CM | POA: Diagnosis not present

## 2022-04-20 DIAGNOSIS — K7469 Other cirrhosis of liver: Secondary | ICD-10-CM | POA: Diagnosis not present

## 2022-04-20 DIAGNOSIS — R188 Other ascites: Secondary | ICD-10-CM | POA: Insufficient documentation

## 2022-04-20 DIAGNOSIS — N289 Disorder of kidney and ureter, unspecified: Secondary | ICD-10-CM | POA: Diagnosis not present

## 2022-04-20 HISTORY — PX: IR RADIOLOGIST EVAL & MGMT: IMG5224

## 2022-04-20 LAB — BODY FLUID CELL COUNT WITH DIFFERENTIAL
Eos, Fluid: 0 %
Lymphs, Fluid: 48 %
Monocyte-Macrophage-Serous Fluid: 45 % — ABNORMAL LOW (ref 50–90)
Neutrophil Count, Fluid: 7 % (ref 0–25)
Total Nucleated Cell Count, Fluid: 102 cu mm (ref 0–1000)

## 2022-04-20 MED ORDER — ALBUMIN HUMAN 25 % IV SOLN
0.0000 g | Freq: Once | INTRAVENOUS | Status: AC
  Filled 2022-04-20: qty 400

## 2022-04-20 MED ORDER — ALBUMIN HUMAN 25 % IV SOLN
INTRAVENOUS | Status: AC
  Administered 2022-04-20: 25 g via INTRAVENOUS
  Filled 2022-04-20: qty 100

## 2022-04-20 NOTE — Telephone Encounter (Signed)
Urine sent for culture. My chart message sent to patient.

## 2022-04-20 NOTE — Progress Notes (Signed)
Referring Physician(s): Manus Rudd, MD   Reason for follow up:  Virtual telephone visit status post TIPS creation on 02/14/22   History of present illness: Shawn [name]. is a 67 y.o. male with history of NASH cirrhosis (Child Pugh B, MELD 15) with recurrent ascites and renal insufficiency preventing use of diuretics status post TIPS creation on 02/14/22.  He presents today via virtual telephone visit accompanied by his wife.    He reports doing about the same, with persistent ascites accumulation and lower extremity swelling.  Since the TIPS, he has had 4 paracenteses, with 4 L fluid each time, planning to go for another today.   He is adherent to a low sodium diet.  He experienced some encephalopathy recently, attributed to missing doses when going for doctors appointments.  He has been more compliant recently with resolution of encephalopathy.  He is awaiting financial aid approval to begin taking rifaximin.      Past Medical History:  Diagnosis Date   ADD (attention deficit disorder)    ADHD    Anxiety 1987   mild (crowds, noises)   Anxiety    Attention deficit disorder    Cancer (HCC)    Cirrhosis of liver (HCC)    CVA (cerebral vascular accident) ([place])    Depression    Diabetic neuropathy ([place])    Essential hypertension, benign    Long-standing history, negative secondary workup   Headache    History of cardiac catheterization    Widely patent coronary and renal arteries November 2013   Hyperlipidemia    Hypertension    whitecoat   Obstructive sleep apnea    cpap   Prediabetes    Sleep apnea    TIA (transient ischemic attack)    Possible, 2007   Type 2 diabetes mellitus (HCC)    Venous stasis     Past Surgical History:  Procedure Laterality Date   ANTERIOR CERVICAL DECOMP/DISCECTOMY FUSION N/A 12/30/2015   Procedure: ANTERIOR CERVICAL DECOMPRESSION/DISCECTOMY FUSION C6 - C7 1 LEVEL;  Surgeon: Melina Schools, MD;  Location: [facility];  Service: Orthopedics;   Laterality: N/A;   CARDIAC CATHETERIZATION  01/2012   negative   CARPAL TUNNEL RELEASE     CERVICAL SPINE NERVE BLOCK  05/17/2017   IR INTRAVASCULAR ULTRASOUND NON CORONARY  02/14/2022   IR PARACENTESIS  02/14/2022   IR RADIOLOGIST EVAL & MGMT  12/03/2021   IR RADIOLOGIST EVAL & MGMT  01/13/2022   IR RADIOLOGIST EVAL & MGMT  03/24/2022   IR TIPS  02/14/2022   IR US GUIDE VASC ACCESS RIGHT  02/14/2022   IR US GUIDE VASC ACCESS RIGHT  02/14/2022   KNEE SURGERY Right    NECK SURGERY     RADIOLOGY WITH ANESTHESIA N/A 02/14/2022   Procedure: TIPS;  Surgeon: Suzette Battiest, MD;  Location: [facility];  Service: Radiology;  Laterality: N/A;   VASECTOMY      Allergies: Patient has no known allergies.  Medications: Prior to Admission medications   Medication Sig Start Date End Date Taking? Authorizing Provider  ascorbic Acid (VITAMIN C) 500 MG CPCR Take 500 mg by mouth daily.    [provider]  aspirin EC 81 MG tablet Take 81 mg by mouth daily. Swallow whole.    [provider]  Cholecalciferol (D3 VITAMIN PO) Take by mouth daily. 5000 IU daily    [provider]  fluticasone (FLONASE) 50 MCG/ACT nasal spray Place 2 sprays into both nostrils daily. 11/29/21  [name], MD  lactulose (CHRONULAC) 10 GM/15ML solution Take 15 mLs (10 g total) by mouth 3 (three) times daily. 02/15/22 02/15/23  Candiss Norse A, PA-C  metFORMIN (GLUCOPHAGE) 500 MG tablet 1 qd Patient taking differently: Take 500 mg by mouth daily with breakfast. 11/01/21   [name], MD  Milk Thistle 1000 MG CAPS Take by mouth daily.    [provider]  Omega-3 Fatty Acids (FISH OIL) 1000 MG CAPS Take by mouth daily.    [provider]  pantoprazole (PROTONIX) 40 MG tablet Take 1 tablet (40 mg total) by mouth daily. 11/01/21   [name], MD  rifaximin (XIFAXAN) 550 MG TABS tablet Take 1 tablet (550 mg total) by mouth 2 (two) times daily. 04/13/22   Rourk, Cristopher Estimable, MD   sodium bicarbonate 650 MG tablet Take 650 mg by mouth 3 (three) times daily. Morning, evening, bedtime    [provider]  venlafaxine XR (EFFEXOR-XR) 150 MG 24 hr capsule Take 1 capsule (150 mg total) by mouth 2 (two) times daily. 11/01/21   [name], MD  vitamin E 180 MG (400 UNITS) capsule Take 400 Units by mouth daily.    [provider]  zinc gluconate 50 MG tablet Take 50 mg by mouth daily.    [provider]     Family History  Problem Relation Age of Onset   Colon cancer Mother    Hypertension Mother    CAD Mother    Breast cancer Mother    Cirrhosis Brother     Social History   Socioeconomic History   Marital status: Married    Spouse name: Not on file   Number of children: 4   Years of education: Not on file   Highest education level: Not on file  Occupational History   Occupation: Barrister's clerk   Tobacco Use   Smoking status: Never   Smokeless tobacco: Current    Types: Snuff   Tobacco comments:    Uses dip pouches occasionally  Vaping Use   Vaping Use: Never used  Substance and Sexual Activity   Alcohol use: No    Alcohol/week: 0.0 standard drinks of alcohol    Comment: Previously 1-2 cases of beer/week. patient states never on regular basis though. last etoh in 2013.    Drug use: No   Sexual activity: Not Currently  Other Topics Concern   Not on file  Social History Narrative   ** Merged History Encounter **       Social Determinants of Health   Financial Resource Strain: Low Risk  (01/04/2022)   Overall Financial Resource Strain (CARDIA)    Difficulty of Paying Living Expenses: Not hard at all  Food Insecurity: No Food Insecurity (02/18/2022)   Hunger Vital Sign    Worried About Running Out of Food in the Last Year: Never true    Ran Out of Food in the Last Year: Never true  Transportation Needs: No Transportation Needs (02/18/2022)   PRAPARE - Hydrologist (Medical): No    Lack of  Transportation (Non-Medical): No  Physical Activity: Inactive (01/04/2022)   Exercise Vital Sign    Days of Exercise per Week: 0 days    Minutes of Exercise per Session: 0 min  Stress: No Stress Concern Present (01/04/2022)   [place]    Feeling of Stress : Only a little  Social Connections: Moderately Integrated (01/04/2022)  Social Licensed conveyancer [NHANES]    Frequency of Communication with Friends and Family: More than three times a week    Frequency of Social Gatherings with Friends and Family: Once a week    Attends Religious Services: More than 4 times per year    Active Member of [employer] or Organizations: No    Attends Archivist Meetings: Never    Marital Status: Married     Vital Signs: There were no vitals taken for this visit.  No physical examination was performed in lieu of virtual telephone clinic visit.   Imaging:  7+2 Viatorr, no post deployment balloon molding. Gradient from 22-->9  Labs:  CBC: Recent Labs    10/18/21 1058 02/14/22 1027 02/15/22 0205 04/11/22 1042  WBC 6.1 7.1 10.1 5.2  HGB 14.6 15.9 14.2 10.8*  HCT 42.3 45.6 40.4 30.9*  PLT 151 161 157 160    COAGS: Recent Labs    11/25/21 1210 02/14/22 1027 02/15/22 0205 04/11/22 1042  INR 1.2 1.2 1.5* 1.3*    BMP: Recent Labs    02/14/22 1027 02/15/22 0205 04/11/22 1041 04/11/22 1042  NA 134* 135 142 142  K 5.4* 5.2* 3.8 3.8  CL 106 108 108* 108*  CO2 16* 19* 22 23  GLUCOSE 90 106* 135* 130*  BUN 74* 61* 20 18  CALCIUM 9.8 9.3 8.7 8.7  CREATININE 2.77* 2.49* 1.47* 1.47*  GFRNONAA 25* 28*  --   --     LIVER FUNCTION TESTS: Recent Labs    11/25/21 1210 02/15/22 0205 04/11/22 1041 04/11/22 1042  BILITOT 0.6 0.9 1.3* 1.3*  AST 36 71* 53* 54*  ALT 25 56* 30 31  ALKPHOS 118 130* 169* 162*  PROT 6.4 5.7* 5.2* 5.2*  ALBUMIN 3.7* 2.6* 2.3* 2.4*    Assessment and Plan: Kaydrian Hunsaker. is a 67 y.o. male with history of NASH cirrhosis (Child Pugh B, MELD 15) with recurrent ascites and renal insufficiency preventing use of diuretics status post TIPS creation on 02/14/22. He is experiencing persistent ascites accumulation and leg swelling post procedure.  Some encephalopathy related to lactulose non-compliance, now improved.  -obtain TIPS duplex -arrange for TIPS check at [place], MD Pager: [phone] Clinic: [phone]    I spent a total of 40 Minutes in virtual telephone clinical consultation, greater than 50% of which was counseling/coordinating care for portal hypertension.

## 2022-04-20 NOTE — Progress Notes (Signed)
Patient tolerated right sided paracentesis and 25 G of IV albumin well today and 4 Liters of clear yellow ascites removed with labs collected and sent for processing. PT verbalized understanding of discharge instructions and ambulatory at departure with family with no acute distress noted.

## 2022-04-20 NOTE — Procedures (Signed)
   US guided RLQ paracentesis  4 Liter Max per MD Yellow fluid obtained Sent for labs per MD  Tolerated well  EBL: scant

## 2022-04-21 LAB — URINE CULTURE: Organism ID, Bacteria: NO GROWTH

## 2022-04-21 NOTE — Telephone Encounter (Signed)
Please see result note 

## 2022-04-26 ENCOUNTER — Telehealth (HOSPITAL_COMMUNITY): Payer: Self-pay | Admitting: Radiology

## 2022-04-26 ENCOUNTER — Other Ambulatory Visit (HOSPITAL_COMMUNITY): Payer: Self-pay | Admitting: Interventional Radiology

## 2022-04-26 DIAGNOSIS — R188 Other ascites: Secondary | ICD-10-CM

## 2022-04-26 DIAGNOSIS — K7460 Unspecified cirrhosis of liver: Secondary | ICD-10-CM

## 2022-04-26 NOTE — Telephone Encounter (Signed)
Called pt, left VM for him to call me back to schedule his TIPS check on 05/09/22 at 1 pm with Dr. Serafina Royals. JM

## 2022-04-29 ENCOUNTER — Ambulatory Visit
Admission: RE | Admit: 2022-04-29 | Discharge: 2022-04-29 | Disposition: A | Discharge status: Home / Self Care | Payer: PPO | Source: Ambulatory Visit | Attending: Interventional Radiology | Admitting: Interventional Radiology

## 2022-04-29 DIAGNOSIS — R188 Other ascites: Secondary | ICD-10-CM

## 2022-04-29 DIAGNOSIS — K7581 Nonalcoholic steatohepatitis (NASH): Secondary | ICD-10-CM

## 2022-05-03 ENCOUNTER — Ambulatory Visit (INDEPENDENT_AMBULATORY_CARE_PROVIDER_SITE_OTHER): Payer: Medicare Other | Admitting: Family Medicine

## 2022-05-03 VITALS — BP 126/83 | HR 80 | Wt 228.6 lb

## 2022-05-03 DIAGNOSIS — K7460 Unspecified cirrhosis of liver: Secondary | ICD-10-CM

## 2022-05-03 NOTE — Progress Notes (Signed)
   Subjective:    Patient ID: Shawn [name]., male    DOB: 1955-09-16, 67 y.o.   MRN: [id number]  HPI Patient arrives today with swelling in both ankles. Patient states that it is the same. Swelling in the legs Significant ascites No longer gets dizzy has not had any passing out spells.  This is been since stopping the amlodipine Denies chest pain does get little short winded when his ascites gets bad They are being evaluated by specialist for the possibility of transplant   Review of Systems     Objective:   Physical Exam  Lungs clear heart regular ascites noted extremities significant pedal edema and lower leg edema  Blood pressure sitting and standing normal    Assessment & Plan:  Orthostasis doing much better Severe ascites Sees GI tomorrow They will more than likely set him up for paracentesis Patient being evaluated for TIPS procedure revision  Follow-up 3 to 4 months

## 2022-05-03 NOTE — Progress Notes (Unsigned)
Referring Provider: [name], MD Primary Care Physician:  [name], MD Primary GI Physician: Dr. Gala Romney  No chief complaint on file.   HPI:   Shawn [name]. is a 67 y.o. male with a history of GERD, cirrhosis felt to be related to NASH +/- alcohol with history of intermittent heavy alcohol drinking in the past, but no alcohol consumption since December 2013. He also has a history of elevated AFP with no HCC on MRI.   In 2021 he had decompensation with ascites and lower extremity edema. First paracentesis at that time with no evidence of SBP, cytology negative. Started requiring recurrent paras in August 2023. TIPS placement 02/16/22, but continued with frequent paras as he developed renal insufficiency and has been intolerant to diuretics.  Also with hepatic encephalopathy. Has is also following with [facility] at [facility] in [place].   Last seen in our office 04/12/22. He continued with frequent re-accumulation of ascites and had plans for TIPS re-evaluation the following week. Also encephalopathic with + flap.  Recommended keeping upcoming appointment to reassess TIPS patency, continue lactulose, and start Xifaxan 550 mg twice daily.  Large-volume paracentesis ordered.  EGD and colonoscopy on hold until after TIPS reassess.  Xifaxan was too expensive.  Plan to submit for patient assistance.  Paracentesis 2/9 and 2/14. No SBP  TIPS was found to be patent on 2/23. TIPS revision/check has been ordered.   Today:  Labs: Up to date 04/11/22 MELD 3.0: 15 [place]: Overdue. Last [place] in August 2023.  AFP: Normal November 2023.  Hep A/B: Immune EGD: Never Ascites/peripheral edema:  Diuretics:  Encephalopathy:   GERD:  Needs EGD/TCS    He is immune to Hep A/B.  Appt on 3/25 with [facility].   Past Medical History:  Diagnosis Date   ADD (attention deficit disorder)    ADHD    Anxiety 1987   mild (crowds, noises)   Anxiety    Attention deficit disorder     Cancer (HCC)    Cirrhosis of liver (HCC)    CVA (cerebral vascular accident) ([place])    Depression    Diabetic neuropathy ([place])    Essential hypertension, benign    Long-standing history, negative secondary workup   Headache    History of cardiac catheterization    Widely patent coronary and renal arteries November 2013   Hyperlipidemia    Hypertension    whitecoat   Obstructive sleep apnea    cpap   Prediabetes    Sleep apnea    TIA (transient ischemic attack)    Possible, 2007   Type 2 diabetes mellitus (HCC)    Venous stasis     Past Surgical History:  Procedure Laterality Date   ANTERIOR CERVICAL DECOMP/DISCECTOMY FUSION N/A 12/30/2015   Procedure: ANTERIOR CERVICAL DECOMPRESSION/DISCECTOMY FUSION C6 - C7 1 LEVEL;  Surgeon: Melina Schools, MD;  Location: [facility];  Service: Orthopedics;  Laterality: N/A;   CARDIAC CATHETERIZATION  01/2012   negative   CARPAL TUNNEL RELEASE     CERVICAL SPINE NERVE BLOCK  05/17/2017   IR INTRAVASCULAR ULTRASOUND NON CORONARY  02/14/2022   IR PARACENTESIS  02/14/2022   IR RADIOLOGIST EVAL & MGMT  12/03/2021   IR RADIOLOGIST EVAL & MGMT  01/13/2022   IR RADIOLOGIST EVAL & MGMT  03/24/2022   IR RADIOLOGIST EVAL & MGMT  04/20/2022   IR TIPS  02/14/2022   IR US GUIDE VASC ACCESS RIGHT  02/14/2022  IR US GUIDE VASC ACCESS RIGHT  02/14/2022   KNEE SURGERY Right    NECK SURGERY     RADIOLOGY WITH ANESTHESIA N/A 02/14/2022   Procedure: TIPS;  Surgeon: Suzette Battiest, MD;  Location: [facility];  Service: Radiology;  Laterality: N/A;   VASECTOMY      Current Outpatient Medications  Medication Sig Dispense Refill   ascorbic Acid (VITAMIN C) 500 MG CPCR Take 500 mg by mouth daily.     aspirin EC 81 MG tablet Take 81 mg by mouth daily. Swallow whole.     Cholecalciferol (D3 VITAMIN PO) Take by mouth daily. 5000 IU daily     fluticasone (FLONASE) 50 MCG/ACT nasal spray Place 2 sprays into both nostrils daily. 16 g 6   lactulose (CHRONULAC) 10 GM/15ML  solution Take 15 mLs (10 g total) by mouth 3 (three) times daily. 1350 mL 11   metFORMIN (GLUCOPHAGE) 500 MG tablet 1 qd (Patient taking differently: Take 500 mg by mouth daily with breakfast.) 90 tablet 1   Milk Thistle 1000 MG CAPS Take by mouth daily.     Omega-3 Fatty Acids (FISH OIL) 1000 MG CAPS Take by mouth daily.     pantoprazole (PROTONIX) 40 MG tablet Take 1 tablet (40 mg total) by mouth daily. 90 tablet 1   rifaximin (XIFAXAN) 550 MG TABS tablet Take 1 tablet (550 mg total) by mouth 2 (two) times daily. 60 tablet 11   sodium bicarbonate 650 MG tablet Take 650 mg by mouth 3 (three) times daily. Morning, evening, bedtime     venlafaxine XR (EFFEXOR-XR) 150 MG 24 hr capsule Take 1 capsule (150 mg total) by mouth 2 (two) times daily. 180 capsule 1   vitamin E 180 MG (400 UNITS) capsule Take 400 Units by mouth daily.     zinc gluconate 50 MG tablet Take 50 mg by mouth daily.     No current facility-administered medications for this visit.    Allergies as of 05/04/2022   (No Known Allergies)    Family History  Problem Relation Age of Onset   Colon cancer Mother    Hypertension Mother    CAD Mother    Breast cancer Mother    Cirrhosis Brother     Social History   Socioeconomic History   Marital status: Married    Spouse name: Not on file   Number of children: 4   Years of education: Not on file   Highest education level: Not on file  Occupational History   Occupation: Barrister's clerk   Tobacco Use   Smoking status: Never   Smokeless tobacco: Current    Types: Snuff   Tobacco comments:    Uses dip pouches occasionally  Vaping Use   Vaping Use: Never used  Substance and Sexual Activity   Alcohol use: No    Alcohol/week: 0.0 standard drinks of alcohol    Comment: Previously 1-2 cases of beer/week. patient states never on regular basis though. last etoh in 2013.    Drug use: No   Sexual activity: Not Currently  Other Topics Concern   Not on file  Social History  Narrative   ** Merged History Encounter **       Social Determinants of Health   Financial Resource Strain: Low Risk  (01/04/2022)   Overall Financial Resource Strain (CARDIA)    Difficulty of Paying Living Expenses: Not hard at all  Food Insecurity: No Food Insecurity (02/18/2022)   Hunger Vital Sign    Worried  About Running Out of Food in the Last Year: Never true    Ran Out of Food in the Last Year: Never true  Transportation Needs: No Transportation Needs (02/18/2022)   PRAPARE - Hydrologist (Medical): No    Lack of Transportation (Non-Medical): No  Physical Activity: Inactive (01/04/2022)   Exercise Vital Sign    Days of Exercise per Week: 0 days    Minutes of Exercise per Session: 0 min  Stress: No Stress Concern Present (01/04/2022)   [place]    Feeling of Stress : Only a little  Social Connections: Moderately Integrated (01/04/2022)   Social Connection and Isolation Panel [NHANES]    Frequency of Communication with Friends and Family: More than three times a week    Frequency of Social Gatherings with Friends and Family: Once a week    Attends Religious Services: More than 4 times per year    Active Member of [employer] or Organizations: No    Attends Archivist Meetings: Never    Marital Status: Married    Review of Systems: Gen: Denies fever, chills, anorexia. Denies fatigue, weakness, weight loss.  CV: Denies chest pain, palpitations, syncope, peripheral edema, and claudication. Resp: Denies dyspnea at rest, cough, wheezing, coughing up blood, and pleurisy. GI: Denies vomiting blood, jaundice, and fecal incontinence.   Denies dysphagia or odynophagia. Derm: Denies rash, itching, dry skin Psych: Denies depression, anxiety, memory loss, confusion. No homicidal or suicidal ideation.  Heme: Denies bruising, bleeding, and enlarged lymph nodes.  Physical Exam: There  were no vitals taken for this visit. General:   Alert and oriented. No distress noted. Pleasant and cooperative.  Head:  Normocephalic and atraumatic. Eyes:  Conjuctiva clear without scleral icterus. Heart:  S1, S2 present without murmurs appreciated. Lungs:  Clear to auscultation bilaterally. No wheezes, rales, or rhonchi. No distress.  Abdomen:  +BS, soft, non-tender and non-distended. No rebound or guarding. No HSM or masses noted. Msk:  Symmetrical without gross deformities. Normal posture. Extremities:  Without edema. Neurologic:  Alert and  oriented x4 Psych:  Normal mood and affect.    Assessment:     Plan:  ***   Aliene Altes, PA-C [facility] 05/04/2022

## 2022-05-04 ENCOUNTER — Other Ambulatory Visit: Payer: Self-pay | Admitting: Gastroenterology

## 2022-05-04 ENCOUNTER — Other Ambulatory Visit: Payer: Self-pay | Admitting: *Deleted

## 2022-05-04 ENCOUNTER — Other Ambulatory Visit: Payer: Self-pay | Admitting: Family Medicine

## 2022-05-04 ENCOUNTER — Encounter: Payer: Self-pay | Admitting: Gastroenterology

## 2022-05-04 ENCOUNTER — Ambulatory Visit (HOSPITAL_COMMUNITY)
Admission: RE | Admit: 2022-05-04 | Discharge: 2022-05-04 | Disposition: A | Discharge status: Home / Self Care | Payer: Medicare Other | Source: Ambulatory Visit | Attending: Gastroenterology | Admitting: Gastroenterology

## 2022-05-04 ENCOUNTER — Ambulatory Visit (INDEPENDENT_AMBULATORY_CARE_PROVIDER_SITE_OTHER): Payer: Medicare Other | Admitting: Gastroenterology

## 2022-05-04 ENCOUNTER — Encounter (HOSPITAL_COMMUNITY): Payer: Self-pay

## 2022-05-04 VITALS — BP 102/74 | HR 82 | Temp 98.2°F | Resp 18

## 2022-05-04 VITALS — BP 115/69 | HR 86 | Temp 98.2°F | Ht 66.0 in | Wt 233.2 lb

## 2022-05-04 DIAGNOSIS — R188 Other ascites: Secondary | ICD-10-CM

## 2022-05-04 DIAGNOSIS — K7460 Unspecified cirrhosis of liver: Secondary | ICD-10-CM | POA: Insufficient documentation

## 2022-05-04 DIAGNOSIS — D649 Anemia, unspecified: Secondary | ICD-10-CM | POA: Diagnosis not present

## 2022-05-04 DIAGNOSIS — K219 Gastro-esophageal reflux disease without esophagitis: Secondary | ICD-10-CM

## 2022-05-04 LAB — GRAM STAIN

## 2022-05-04 LAB — BODY FLUID CELL COUNT WITH DIFFERENTIAL
Eos, Fluid: 0 %
Lymphs, Fluid: 52 %
Monocyte-Macrophage-Serous Fluid: 32 % — ABNORMAL LOW (ref 50–90)
Neutrophil Count, Fluid: 16 % (ref 0–25)
Total Nucleated Cell Count, Fluid: 123 cu mm (ref 0–1000)

## 2022-05-04 LAB — CBC WITH DIFFERENTIAL/PLATELET
Basophils Absolute: 0.1 10*3/uL (ref 0.0–0.2)
Basos: 2 %
EOS (ABSOLUTE): 0.6 10*3/uL — ABNORMAL HIGH (ref 0.0–0.4)
Eos: 9 %
Hematocrit: 32.7 % — ABNORMAL LOW (ref 37.5–51.0)
Hemoglobin: 11.2 g/dL — ABNORMAL LOW (ref 13.0–17.7)
Immature Grans (Abs): 0 10*3/uL (ref 0.0–0.1)
Immature Granulocytes: 0 %
Lymphocytes Absolute: 1 10*3/uL (ref 0.7–3.1)
Lymphs: 15 %
MCH: 31.6 pg (ref 26.6–33.0)
MCHC: 34.3 g/dL (ref 31.5–35.7)
MCV: 92 fL (ref 79–97)
Monocytes Absolute: 0.7 10*3/uL (ref 0.1–0.9)
Monocytes: 11 %
Neutrophils Absolute: 4.2 10*3/uL (ref 1.4–7.0)
Neutrophils: 63 %
Platelets: 164 10*3/uL (ref 150–450)
RBC: 3.54 x10E6/uL — ABNORMAL LOW (ref 4.14–5.80)
RDW: 13.7 % (ref 11.6–15.4)
WBC: 6.6 10*3/uL (ref 3.4–10.8)

## 2022-05-04 MED ORDER — ALBUMIN HUMAN 25 % IV SOLN
INTRAVENOUS | Status: AC
  Administered 2022-05-04: 25 g via INTRAVENOUS
  Filled 2022-05-04: qty 100

## 2022-05-04 MED ORDER — ALBUMIN HUMAN 25 % IV SOLN: 25.0000 g | Freq: Once | INTRAVENOUS | Status: AC

## 2022-05-04 NOTE — Patient Instructions (Addendum)
Please have blood work completed at [facility].  We will arrange for you to have a paracentesis.  I am also placing standing orders for repeat paracentesis.  You can have a paracentesis up to twice per week, but I recommend that you separate these by 4 days.  Nutrition:  High-protein diet from a primarily plant-based diet.  I recommend that you consume at least 100 g of protein daily. Avoid red meat.  No raw or undercooked meat, seafood, or shellfish. Low-fat/cholesterol/carbohydrate diet. Limit sodium to no more than 2000 mg/day including everything that you eat and drink.   Please call Dr. Serafina Royals to follow-up on the plan with your tips.  Please call me back and let me know what they determine.  We will determine the timing of your upper endoscopy and colonoscopy after we know if you are going to have TIPS revision or not.  It was good to see you today!   Aliene Altes, PA-C [facility]

## 2022-05-04 NOTE — Procedures (Signed)
   US guided RLQ paracentesis 4 Liters (max per MD) yellow fluid Sent for labs per MD  Tolerated well  EBL: less than 1 cc

## 2022-05-04 NOTE — Progress Notes (Signed)
Patient tolerated right sided paracentesis procedure and 25G of IV albumin well today and 4 Liters of yellow ascites removed. Labs collected and taken to lab for processing at 1214 by Kristen (ultrasound). Patient verbalized understanding of discharge instructions and ambulatory at departure with no acute distress noted.

## 2022-05-04 NOTE — Addendum Note (Signed)
Addended by: Cheron Every on: 05/04/2022 02:05 PM   Modules accepted: Orders

## 2022-05-05 ENCOUNTER — Telehealth: Payer: Self-pay | Admitting: *Deleted

## 2022-05-05 NOTE — Telephone Encounter (Signed)
Pt called and states he has a procedure on Monday March 4th and can't have his paracentesis. I informed him to call radiology and cancel for that day and try to reschedule for another day.

## 2022-05-05 NOTE — Telephone Encounter (Signed)
Looks like TIPS revision is scheduled for 3/4. Agree with rescheduling paracentesis. He can verify with Dr. Serafina Royals that he is able to have paracentesis any day after TIPS revision.   Lets plan for follow-up in the office in about 4 weeks to see how he is doing after TIPS revision.   Mandy: Please arrange follow-up in 4 weeks.

## 2022-05-06 ENCOUNTER — Other Ambulatory Visit: Payer: Self-pay | Admitting: Student

## 2022-05-06 DIAGNOSIS — D649 Anemia, unspecified: Secondary | ICD-10-CM | POA: Diagnosis not present

## 2022-05-06 DIAGNOSIS — K7460 Unspecified cirrhosis of liver: Secondary | ICD-10-CM

## 2022-05-06 DIAGNOSIS — D539 Nutritional anemia, unspecified: Secondary | ICD-10-CM | POA: Diagnosis not present

## 2022-05-06 NOTE — Telephone Encounter (Signed)
Noted  

## 2022-05-07 LAB — B12 AND FOLATE PANEL
Folate: 9 ng/mL
Vitamin B-12: >2000 pg/mL — ABNORMAL HIGH (ref 200–1100)

## 2022-05-07 LAB — IRON,TIBC AND FERRITIN PANEL
%SAT: 64 % (calc) — ABNORMAL HIGH (ref 20–48)
Ferritin: 191 ng/mL (ref 24–380)
Iron: 87 ug/dL (ref 50–180)
TIBC: 136 mcg/dL (calc) — ABNORMAL LOW (ref 250–425)

## 2022-05-09 ENCOUNTER — Other Ambulatory Visit (HOSPITAL_COMMUNITY): Payer: Self-pay | Admitting: Interventional Radiology

## 2022-05-09 ENCOUNTER — Ambulatory Visit (HOSPITAL_COMMUNITY): Payer: Medicare Other

## 2022-05-09 ENCOUNTER — Encounter (HOSPITAL_COMMUNITY): Payer: Self-pay

## 2022-05-09 ENCOUNTER — Ambulatory Visit (HOSPITAL_COMMUNITY)
Admission: RE | Admit: 2022-05-09 | Discharge: 2022-05-09 | Disposition: A | Discharge status: Home / Self Care | Payer: Medicare Other | Source: Ambulatory Visit | Attending: Interventional Radiology | Admitting: Interventional Radiology

## 2022-05-09 ENCOUNTER — Other Ambulatory Visit: Payer: Self-pay

## 2022-05-09 DIAGNOSIS — K766 Portal hypertension: Secondary | ICD-10-CM | POA: Diagnosis not present

## 2022-05-09 DIAGNOSIS — R188 Other ascites: Secondary | ICD-10-CM | POA: Diagnosis not present

## 2022-05-09 DIAGNOSIS — K7460 Unspecified cirrhosis of liver: Secondary | ICD-10-CM | POA: Diagnosis not present

## 2022-05-09 DIAGNOSIS — F17290 Nicotine dependence, other tobacco product, uncomplicated: Secondary | ICD-10-CM | POA: Diagnosis not present

## 2022-05-09 DIAGNOSIS — Z9689 Presence of other specified functional implants: Secondary | ICD-10-CM | POA: Diagnosis not present

## 2022-05-09 DIAGNOSIS — G4733 Obstructive sleep apnea (adult) (pediatric): Secondary | ICD-10-CM | POA: Diagnosis not present

## 2022-05-09 DIAGNOSIS — F32A Depression, unspecified: Secondary | ICD-10-CM | POA: Diagnosis not present

## 2022-05-09 DIAGNOSIS — E119 Type 2 diabetes mellitus without complications: Secondary | ICD-10-CM | POA: Diagnosis not present

## 2022-05-09 DIAGNOSIS — I10 Essential (primary) hypertension: Secondary | ICD-10-CM | POA: Diagnosis not present

## 2022-05-09 DIAGNOSIS — K7581 Nonalcoholic steatohepatitis (NASH): Secondary | ICD-10-CM | POA: Diagnosis not present

## 2022-05-09 DIAGNOSIS — F909 Attention-deficit hyperactivity disorder, unspecified type: Secondary | ICD-10-CM | POA: Insufficient documentation

## 2022-05-09 DIAGNOSIS — Z8673 Personal history of transient ischemic attack (TIA), and cerebral infarction without residual deficits: Secondary | ICD-10-CM | POA: Diagnosis not present

## 2022-05-09 HISTORY — PX: IR US GUIDE VASC ACCESS RIGHT: IMG2390

## 2022-05-09 HISTORY — PX: IR PARACENTESIS: IMG2679

## 2022-05-09 HISTORY — PX: IR TIPS REVISION MOD SED: IMG2296

## 2022-05-09 LAB — COMPREHENSIVE METABOLIC PANEL
ALT: 28 U/L (ref 0–44)
AST: 56 U/L — ABNORMAL HIGH (ref 15–41)
Albumin: 2.1 g/dL — ABNORMAL LOW (ref 3.5–5.0)
Alkaline Phosphatase: 138 U/L — ABNORMAL HIGH (ref 38–126)
Anion gap: 4 — ABNORMAL LOW (ref 5–15)
BUN: 17 mg/dL (ref 8–23)
CO2: 27 mmol/L (ref 22–32)
Calcium: 8.4 mg/dL — ABNORMAL LOW (ref 8.9–10.3)
Chloride: 106 mmol/L (ref 98–111)
Creatinine, Ser: 1.49 mg/dL — ABNORMAL HIGH (ref 0.61–1.24)
GFR, Estimated: 52 mL/min — ABNORMAL LOW (ref >60)
Glucose, Bld: 69 mg/dL — ABNORMAL LOW (ref 70–99)
Potassium: 3.2 mmol/L — ABNORMAL LOW (ref 3.5–5.1)
Sodium: 137 mmol/L (ref 135–145)
Total Bilirubin: 2.1 mg/dL — ABNORMAL HIGH (ref 0.3–1.2)
Total Protein: 5.6 g/dL — ABNORMAL LOW (ref 6.5–8.1)

## 2022-05-09 LAB — GLUCOSE, CAPILLARY
Glucose-Capillary: 65 mg/dL — ABNORMAL LOW (ref 70–99)
Glucose-Capillary: 87 mg/dL (ref 70–99)
Glucose-Capillary: 84 mg/dL (ref 70–99)

## 2022-05-09 LAB — PROTIME-INR
INR: 1.4 — ABNORMAL HIGH (ref 0.8–1.2)
Prothrombin Time: 17.1 seconds — ABNORMAL HIGH (ref 11.4–15.2)

## 2022-05-09 LAB — CULTURE, BODY FLUID W GRAM STAIN -BOTTLE: Culture: NO GROWTH

## 2022-05-09 LAB — CYTOLOGY - NON PAP

## 2022-05-09 MED ORDER — DEXTROSE 50 % IV SOLN
12.5000 g | INTRAVENOUS | Status: AC
  Administered 2022-05-09: 12.5 g via INTRAVENOUS

## 2022-05-09 MED ORDER — LACTULOSE 20 GM/30ML PO SOLN: 15.0000 mL | Freq: Three times a day (TID) | ORAL | 2 refills | Status: DC

## 2022-05-09 MED ORDER — MIDAZOLAM HCL 2 MG/2ML IJ SOLN
INTRAMUSCULAR | Status: AC
  Filled 2022-05-09: qty 2

## 2022-05-09 MED ORDER — ALBUMIN HUMAN 25 % IV SOLN
INTRAVENOUS | Status: AC
  Administered 2022-05-09: 50 g via INTRAVENOUS
  Filled 2022-05-09: qty 200

## 2022-05-09 MED ORDER — FENTANYL CITRATE (PF) 100 MCG/2ML IJ SOLN
INTRAMUSCULAR | Status: AC | PRN
  Administered 2022-05-09 (×2): 25 ug via INTRAVENOUS

## 2022-05-09 MED ORDER — ALBUMIN HUMAN 25 % IV SOLN: 50.0000 g | Freq: Once | INTRAVENOUS | Status: AC

## 2022-05-09 MED ORDER — DEXTROSE 50 % IV SOLN
INTRAVENOUS | Status: AC
  Filled 2022-05-09: qty 50

## 2022-05-09 MED ORDER — MIDAZOLAM HCL 2 MG/2ML IJ SOLN
INTRAMUSCULAR | Status: AC | PRN
  Administered 2022-05-09: 1 mg via INTRAVENOUS

## 2022-05-09 MED ORDER — LIDOCAINE-EPINEPHRINE 1 %-1:100000 IJ SOLN
INTRAMUSCULAR | Status: AC
  Administered 2022-05-09: 20 mL
  Filled 2022-05-09: qty 1

## 2022-05-09 MED ORDER — IOHEXOL 300 MG/ML  SOLN
100.0000 mL | Freq: Once | INTRAMUSCULAR | Status: AC | PRN
  Administered 2022-05-09: 40 mL via INTRA_ARTERIAL

## 2022-05-09 MED ORDER — SODIUM CHLORIDE 0.9 % IV SOLN: INTRAVENOUS | Status: DC

## 2022-05-09 MED ORDER — FENTANYL CITRATE (PF) 100 MCG/2ML IJ SOLN
INTRAMUSCULAR | Status: AC
  Filled 2022-05-09: qty 2

## 2022-05-09 NOTE — Progress Notes (Signed)
Lactulose e-prescribed to [facility] in [place]. 15 ml (10 grams) TID x 30 days with 2 refills. Patient aware he needs to follow up with his GI doctor who is the regular prescriber for lactulose.  Soyla Dryer, [place] [phone] 05/09/2022, 2:22 PM

## 2022-05-09 NOTE — Sedation Documentation (Signed)
PV Pressure = 29 per Dr. Serafina Royals

## 2022-05-09 NOTE — Sedation Documentation (Signed)
Final RA Pressure = 12 & PV = 20 with Gradient = 8 per Dr. Serafina Royals

## 2022-05-09 NOTE — Sedation Documentation (Signed)
RA Pressure = 11 per Dr. Serafina Royals

## 2022-05-09 NOTE — Procedures (Signed)
Interventional Radiology Procedure Note  Procedure:  1) Ultrasound guided paracentesis  2) TIPS check 3) TIPS balloon expansion  Findings: Please refer to procedural dictation for full description. Patent TIPS.  Initial mean portosystemic gradient of 18 mmHg.  Balloon expansion to 10 mm with decrease in gradient to 8 mmHg.  9.6 L ascites drained, albumin administered IV per protocol.  Complications: None immediate  Estimated Blood Loss: < 5 mL  Recommendations: 1 hour bedrest Follow up in IR clinic in 1 month.   Ruthann Cancer, MD Pager: [phone] Clinic: [phone]

## 2022-05-09 NOTE — H&P (Addendum)
Chief Complaint: Patient was seen in consultation today for NASH cirrhosis with recurrent ascites and lower extremity swelling.   Referring Physician(s): Dr. Gala Romney   Supervising Physician: Ruthann Cancer  Patient Status: [name] - Out-pt  History of Present Illness: [name]. is a 67 y.o. male with a medical history significant for ADHD, anxiety/depression, stroke, HTN, DM2 and obstructive sleep apnea. He also has a history of NASH cirrhosis with recurrent ascites and concomitant renal insufficiency, presumed hepatorenal syndrome. He is familiar to IR from numerous paracenteses and is now s/p TIPS creation 02/14/22 by Dr. Serafina Royals.   He continues to have persistent ascites and lower extremity swelling. He has had several follow up visits with Dr. Serafina Royals with the last one on 04/20/22.  He had recent encephalopathy which he attributed to missing doses of his medications and he continues to experience persistent ascites accumulation with leg swelling. [name] endorses following a low sodium diet. He has renal insufficiency which limits his use of diuretics.  Dr. Serafina Royals made the recommendation for additional imaging and a TIPS check at [facility]. The patient was in agreement and he presents today for an image-guided TIPS check with moderate sedation.   Past Medical History:  Diagnosis Date   ADD (attention deficit disorder)    ADHD    Anxiety 1987   mild (crowds, noises)   Anxiety    Attention deficit disorder    Cancer (HCC)    Cirrhosis of liver (HCC)    CVA (cerebral vascular accident) ([name])    Depression    Diabetic neuropathy ([place])    Essential hypertension, benign    Long-standing history, negative secondary workup   Headache    History of cardiac catheterization    Widely patent coronary and renal arteries November 2013   Hyperlipidemia    Hypertension    whitecoat   Obstructive sleep apnea    cpap   Prediabetes    Sleep apnea    TIA (transient ischemic  attack)    Possible, 2007   Type 2 diabetes mellitus (HCC)    Venous stasis     Past Surgical History:  Procedure Laterality Date   ANTERIOR CERVICAL DECOMP/DISCECTOMY FUSION N/A 12/30/2015   Procedure: ANTERIOR CERVICAL DECOMPRESSION/DISCECTOMY FUSION C6 - C7 1 LEVEL;  Surgeon: Melina Schools, MD;  Location: [name];  Service: Orthopedics;  Laterality: N/A;   CARDIAC CATHETERIZATION  01/2012   negative   CARPAL TUNNEL RELEASE     CERVICAL SPINE NERVE BLOCK  05/17/2017   IR INTRAVASCULAR ULTRASOUND NON CORONARY  02/14/2022   IR PARACENTESIS  02/14/2022   IR RADIOLOGIST EVAL & MGMT  12/03/2021   IR RADIOLOGIST EVAL & MGMT  01/13/2022   IR RADIOLOGIST EVAL & MGMT  03/24/2022   IR RADIOLOGIST EVAL & MGMT  04/20/2022   IR TIPS  02/14/2022   IR US GUIDE VASC ACCESS RIGHT  02/14/2022   IR US GUIDE VASC ACCESS RIGHT  02/14/2022   KNEE SURGERY Right    NECK SURGERY     RADIOLOGY WITH ANESTHESIA N/A 02/14/2022   Procedure: TIPS;  Surgeon: Suzette Battiest, MD;  Location: [name];  Service: Radiology;  Laterality: N/A;   VASECTOMY      Allergies: Patient has no known allergies.  Medications: Prior to Admission medications   Medication Sig Start Date End Date Taking? Authorizing Provider  ascorbic Acid (VITAMIN C) 500 MG CPCR Take 500 mg by mouth daily.    [provider]  aspirin EC 81 MG tablet Take 81 mg by mouth daily. Swallow whole.    [provider]  Cholecalciferol (D3 VITAMIN PO) Take by mouth daily. 5000 IU daily    [provider]  fluticasone (FLONASE) 50 MCG/ACT nasal spray Place 2 sprays into both nostrils daily. Patient not taking: Reported on 05/04/2022 11/29/21   [name], MD  lactulose (CHRONULAC) 10 GM/15ML solution Take 15 mLs (10 g total) by mouth 3 (three) times daily. 02/15/22 02/15/23  Candiss Norse A, PA-C  metFORMIN (GLUCOPHAGE) 500 MG tablet 1 qd Patient taking differently: Take 500 mg by mouth daily with breakfast. 11/01/21    [name], MD  Milk Thistle 1000 MG CAPS Take by mouth daily.    [provider]  Omega-3 Fatty Acids (FISH OIL) 1000 MG CAPS Take by mouth daily.    [provider]  pantoprazole (PROTONIX) 40 MG tablet Take 1 tablet (40 mg total) by mouth daily. 11/01/21   [name], MD  rifaximin (XIFAXAN) 550 MG TABS tablet Take 1 tablet (550 mg total) by mouth 2 (two) times daily. Patient not taking: Reported on 05/04/2022 04/13/22   Daneil Dolin, MD  sodium bicarbonate 650 MG tablet Take 650 mg by mouth 3 (three) times daily. Morning, evening, bedtime    [provider]  venlafaxine XR (EFFEXOR-XR) 150 MG 24 hr capsule Take 1 capsule (150 mg total) by mouth 2 (two) times daily. 11/01/21   [name], MD  vitamin E 180 MG (400 UNITS) capsule Take 400 Units by mouth daily.    [provider]  zinc gluconate 50 MG tablet Take 50 mg by mouth daily.    [provider]     Family History  Problem Relation Age of Onset   Colon cancer Mother    Hypertension Mother    CAD Mother    Breast cancer Mother    Cirrhosis Brother     Social History   Socioeconomic History   Marital status: Married    Spouse name: Not on file   Number of children: 4   Years of education: Not on file   Highest education level: Not on file  Occupational History   Occupation: Barrister's clerk   Tobacco Use   Smoking status: Never   Smokeless tobacco: Current    Types: Snuff   Tobacco comments:    Uses dip pouches occasionally  Vaping Use   Vaping Use: Never used  Substance and Sexual Activity   Alcohol use: No    Alcohol/week: 0.0 standard drinks of alcohol    Comment: Previously 1-2 cases of beer/week. patient states never on regular basis though. last etoh in 2013.    Drug use: No   Sexual activity: Not Currently  Other Topics Concern   Not on file  Social History Narrative   ** Merged History Encounter **       Social Determinants of Health   Financial  Resource Strain: Low Risk  (01/04/2022)   Overall Financial Resource Strain (CARDIA)    Difficulty of Paying Living Expenses: Not hard at all  Food Insecurity: No Food Insecurity (02/18/2022)   Hunger Vital Sign    Worried About Running Out of Food in the Last Year: Never true    Ran Out of Food in the Last Year: Never true  Transportation Needs: No Transportation Needs (02/18/2022)   PRAPARE - Hydrologist (Medical): No    Lack of Transportation (  Non-Medical): No  Physical Activity: Inactive (01/04/2022)   Exercise Vital Sign    Days of Exercise per Week: 0 days    Minutes of Exercise per Session: 0 min  Stress: No Stress Concern Present (01/04/2022)   [place]ander    Feeling of Stress : Only a little  Social Connections: Moderately Integrated (01/04/2022)   Social Connection and Isolation Panel [NHANES]    Frequency of Communication with Friends and Family: More than three times a week    Frequency of Social Gatherings with Friends and Family: Once a week    Attends Religious Services: More than 4 times per year    Active Member of [employer] or Organizations: No    Attends Archivist Meetings: Never    Marital Status: Married    Review of Systems: A 12 point ROS discussed and pertinent positives are indicated in the HPI above.  All other systems are negative.  Review of Systems  Constitutional:  Negative for appetite change and fatigue.  Respiratory:  Negative for cough and shortness of breath.   Cardiovascular:  Positive for leg swelling. Negative for chest pain.  Gastrointestinal:  Positive for abdominal distention. Negative for abdominal pain, nausea and vomiting.  Neurological:  Negative for dizziness and headaches.    Vital Signs: BP 127/82   Pulse 90   Temp 98.4 F (36.9 C) (Oral)   Resp 18   Ht '5\' 6"'$  (1.676 m)   Wt 225 lb (102.1 kg)   SpO2 100%   BMI 36.32 kg/m    Physical Exam Constitutional:      General: He is not in acute distress.    Appearance: He is not ill-appearing.  HENT:     Mouth/Throat:     Mouth: Mucous membranes are moist.     Pharynx: Oropharynx is clear.  Cardiovascular:     Rate and Rhythm: Normal rate and regular rhythm.     Pulses: Normal pulses.     Heart sounds: Normal heart sounds.  Pulmonary:     Effort: Pulmonary effort is normal.     Breath sounds: Normal breath sounds.  Abdominal:     General: There is distension.     Tenderness: There is no abdominal tenderness.     Comments: Ascites   Musculoskeletal:     Right lower leg: Edema present.     Left lower leg: Edema present.     Comments: 4+ pitting edema bilaterally  Skin:    General: Skin is warm and dry.  Neurological:     Mental Status: He is alert and oriented to person, place, and time.  Psychiatric:        Mood and Affect: Mood normal.        Behavior: Behavior normal.        Thought Content: Thought content normal.        Judgment: Judgment normal.     Imaging: US Paracentesis  Result Date: 05/04/2022 INDICATION: Recurrent ascites; cirrhosis EXAM: ULTRASOUND GUIDED RLQ PARACENTESIS MEDICATIONS: 10 cc 1% lidocaine COMPLICATIONS: None immediate. PROCEDURE: Informed written consent was obtained from the patient after a discussion of the risks, benefits and alternatives to treatment. A timeout was performed prior to the initiation of the procedure. Initial ultrasound scanning demonstrates a large amount of ascites within the right lower abdominal quadrant. The right lower abdomen was prepped and draped in the usual sterile fashion. 1% lidocaine was used for local anesthesia. Following this, a 7 cm  Yueh catheter was introduced. An ultrasound image was saved for documentation purposes. The paracentesis was performed. The catheter was removed and a dressing was applied. The patient tolerated the procedure well without immediate post procedural complication.  Patient received post-procedure intravenous albumin; see nursing notes for details. FINDINGS: A total of approximately 4 liters (max) of yellow fluid was removed. Samples were sent to the laboratory as requested by the clinical team. IMPRESSION: Successful ultrasound-guided paracentesis yielding 4 liters of peritoneal fluid. TIPs procedure performed in IR 02/15/23 Read by Lavonia Drafts [employer] Electronically Signed   By: Lavonia Dana M.D.   On: 05/04/2022 14:20   US ABDOMINAL PELVIC ART/VENT FLOW DOPPLER  Result Date: 04/29/2022 CLINICAL DATA:  67 year old male with history of NASH cirrhosis and refractory ascites status post TIPS placement. EXAM: DUPLEX ULTRASOUND OF LIVER AND TIPS SHUNT TECHNIQUE: Color and duplex Doppler ultrasound was performed to evaluate the hepatic in-flow and out-flow vessels. COMPARISON:  02/14/2022, 10/15/2021 FINDINGS: Portal Vein Velocities Main:  65 cm/sec, antegrade Right:  114 cm/sec, antegrade Left:  18 cm/sec, retrograde TIPS Stent Velocities Portal: 114 cm/sec Mid:  127 Central: 149 cm/sec IVC: Present and patent with normal respiratory phasicity. Hepatic Vein Velocities Right:  55 cm/sec Mid:  149 cm/sec Left:  42 cm/sec Splenic Vein: Patent. Superior Mesenteric Vein: Not visualized. Hepatic Artery: 81 centimeters/second Ascities: Present Varices: Absent Other findings: Similar cirrhotic appearance of the liver. IMPRESSION: Patent indwelling TIPS endograft. Ruthann Cancer, MD [facility] Radiology Electronically Signed   By: Ruthann Cancer M.D.   On: 04/29/2022 12:41   US Paracentesis  Result Date: 04/20/2022 INDICATION: Ascites/ Cirrhosis EXAM: ULTRASOUND GUIDED RLQ PARACENTESIS MEDICATIONS: 10 cc 1% lidocaine. COMPLICATIONS: None immediate. PROCEDURE: Informed written consent was obtained from the patient after a discussion of the risks, benefits and alternatives to treatment. A timeout was performed prior to the initiation of  the procedure. Initial ultrasound scanning demonstrates a large amount of ascites within the right lower abdominal quadrant. The right lower abdomen was prepped and draped in the usual sterile fashion. 1% lidocaine was used for local anesthesia. Following this, a 7 cm Yueh catheter was introduced. An ultrasound image was saved for documentation purposes. The paracentesis was performed. The catheter was removed and a dressing was applied. The patient tolerated the procedure well without immediate post procedural complication. Patient received post-procedure intravenous albumin; see nursing notes for details. FINDINGS: A total of approximately 4 liters (max per MD) of yellow fluid was removed. Samples were sent to the laboratory as requested by the clinical team. IMPRESSION: Successful ultrasound-guided paracentesis yielding 4 liters of peritoneal fluid. Read by Lavonia Drafts [facility] Electronically Signed   By: Titus Dubin M.D.   On: 04/20/2022 10:55   IR Radiologist Eval & Mgmt  Result Date: 04/20/2022 EXAM: ESTABLISHED PATIENT OFFICE VISIT CHIEF COMPLAINT: See Epic note. HISTORY OF PRESENT ILLNESS: See Epic note. REVIEW OF SYSTEMS: See Epic note. PHYSICAL EXAMINATION: See Epic note. ASSESSMENT AND PLAN: See Epic note. Ruthann Cancer, MD [facility] Radiology Electronically Signed   By: Ruthann Cancer M.D.   On: 04/20/2022 08:50   US Paracentesis  Result Date: 04/15/2022 INDICATION: Cirrhosis, ascites EXAM: ULTRASOUND GUIDED DIAGNOSTIC AND THERAPEUTIC PARACENTESIS MEDICATIONS: None. COMPLICATIONS: None immediate. PROCEDURE: Informed written consent was obtained from the patient after a discussion of the risks, benefits and alternatives to treatment. A timeout was performed prior to the initiation of the procedure. Initial ultrasound scanning demonstrates a large  amount of ascites within the LEFT lower abdominal quadrant. The right lower abdomen was prepped and  draped in the usual sterile fashion. 1% lidocaine was used for local anesthesia. Following this, a 19 gauge, 7-cm, Yueh catheter was introduced. An ultrasound image was saved for documentation purposes. The paracentesis was performed. The catheter was removed and a dressing was applied. The patient tolerated the procedure well without immediate post procedural complication. Patient received post-procedure intravenous albumin; see nursing notes for details. FINDINGS: A total of approximately 4 L of slightly cloudy yellow ascitic fluid was removed. Samples were sent to the laboratory as requested by the clinical team. IMPRESSION: Successful ultrasound-guided paracentesis yielding 4 liters of peritoneal fluid. Electronically Signed   By: Lavonia Dana M.D.   On: 04/15/2022 12:00   US Paracentesis  Result Date: 04/11/2022 INDICATION: Cirrhosis, ascites EXAM: ULTRASOUND GUIDED THERAPEUTIC PARACENTESIS MEDICATIONS: None. COMPLICATIONS: None immediate. PROCEDURE: Informed written consent was obtained from the patient after a discussion of the risks, benefits and alternatives to treatment. A timeout was performed prior to the initiation of the procedure. Initial ultrasound scanning demonstrates a large amount of ascites within the right lower abdominal quadrant. The right lower abdomen was prepped and draped in the usual sterile fashion. 1% lidocaine was used for local anesthesia. Following this, a 19 gauge, 7-cm, Yueh catheter was introduced. An ultrasound image was saved for documentation purposes. The paracentesis was performed. The catheter was removed and a dressing was applied. The patient tolerated the procedure well without immediate post procedural complication. Patient received post-procedure intravenous albumin; see nursing notes for details. FINDINGS: A total of approximately 4 L of yellow ascitic fluid was removed. Samples were sent to the laboratory as requested by the clinical team. IMPRESSION: Successful  ultrasound-guided paracentesis yielding 4 liters of peritoneal fluid. Electronically Signed   By: Lavonia Dana M.D.   On: 04/11/2022 13:56    Labs:  CBC: Recent Labs    02/14/22 1027 02/15/22 0205 04/11/22 1042 05/03/22 0903  WBC 7.1 10.1 5.2 6.6  HGB 15.9 14.2 10.8* 11.2*  HCT 45.6 40.4 30.9* 32.7*  PLT 161 157 160 164    COAGS: Recent Labs    11/25/21 1210 02/14/22 1027 02/15/22 0205 04/11/22 1042  INR 1.2 1.2 1.5* 1.3*    BMP: Recent Labs    02/14/22 1027 02/15/22 0205 04/11/22 1041 04/11/22 1042  NA 134* 135 142 142  K 5.4* 5.2* 3.8 3.8  CL 106 108 108* 108*  CO2 16* 19* 22 23  GLUCOSE 90 106* 135* 130*  BUN 74* 61* 20 18  CALCIUM 9.8 9.3 8.7 8.7  CREATININE 2.77* 2.49* 1.47* 1.47*  GFRNONAA 25* 28*  --   --     LIVER FUNCTION TESTS: Recent Labs    11/25/21 1210 02/15/22 0205 04/11/22 1041 04/11/22 1042  BILITOT 0.6 0.9 1.3* 1.3*  AST 36 71* 53* 54*  ALT 25 56* 30 31  ALKPHOS 118 130* 169* 162*  PROT 6.4 5.7* 5.2* 5.2*  ALBUMIN 3.7* 2.6* 2.3* 2.4*    TUMOR MARKERS: No results for input(s): "AFPTM", "CEA", "CA199", "CHROMGRNA" in the last 8760 hours.  Assessment and Plan:  NASH cirrhosis with recurrent ascites s/p TIPS creation 02/14/22; persistent ascites accumulation and lower extremity swelling: Wilfredo L. Alyson Reedy., 67 year old male, presents today to the [facility] Interventional Radiology department for an image-guided transjugular intra-hepatic portosystemic shunt check with possible intervention, possible paracentesis. This procedure will be done under moderate sedation.   Risks and benefits of TIPS,  BRTO and/or additional variceal embolization were discussed with the patient and/or the patient's family including, but not limited to, infection, bleeding, damage to adjacent structures, worsening hepatic and/or cardiac function, non-target embolization and death.   This interventional procedure involves the use of X-rays and because of the  nature of the planned procedure, it is possible that we will have prolonged use of X-ray fluoroscopy. Potential radiation risks to you include (but are not limited to) the following: - A slightly elevated risk for cancer  several years later in life. This risk is typically less than 0.5% percent. This risk is low in comparison to the normal incidence of human cancer, which is 33% for women and 50% for men according to the [employer]. - Radiation induced injury can include skin redness, resembling a rash, tissue breakdown / ulcers and hair loss (which can be temporary or permanent).  The likelihood of either of these occurring depends on the difficulty of the procedure and whether you are sensitive to radiation due to previous procedures, disease, or genetic conditions.  IF your procedure requires a prolonged use of radiation, you will be notified and given written instructions for further action.  It is your responsibility to monitor the irradiated area for the 2 weeks following the procedure and to notify your physician if you are concerned that you have suffered a radiation induced injury.    All of the patient's questions were answered, patient is agreeable to proceed. He has been NPO. He is a full code. Patient was mildly hypoglycemic upon arrival to Short Stay (CBG 65) and he will receive IV dextrose per protocol.   Consent signed and in chart.  Thank you for this interesting consult.  I greatly enjoyed meeting Mahdi L Halston Parro. and look forward to participating in their care.  A copy of this report was sent to the requesting provider on this date.  Electronically Signed: Soyla Dryer, AGACNP-BC [phone] 05/09/2022, 11:20 AM   I spent a total of  30 Minutes   in face to face in clinical consultation, greater than 50% of which was counseling/coordinating care for TIPS check

## 2022-05-10 ENCOUNTER — Other Ambulatory Visit (INDEPENDENT_AMBULATORY_CARE_PROVIDER_SITE_OTHER): Payer: Self-pay | Admitting: *Deleted

## 2022-05-10 ENCOUNTER — Telehealth: Payer: Self-pay | Admitting: Family Medicine

## 2022-05-10 ENCOUNTER — Other Ambulatory Visit: Payer: Self-pay | Admitting: Interventional Radiology

## 2022-05-10 DIAGNOSIS — K7581 Nonalcoholic steatohepatitis (NASH): Secondary | ICD-10-CM

## 2022-05-10 DIAGNOSIS — D649 Anemia, unspecified: Secondary | ICD-10-CM

## 2022-05-10 DIAGNOSIS — E1122 Type 2 diabetes mellitus with diabetic chronic kidney disease: Secondary | ICD-10-CM

## 2022-05-10 NOTE — Telephone Encounter (Signed)
Patient needs a new meter and supply to check his sugar called into [facility] in [place]. He states the one he has isn't dependable.   CB# [phone]

## 2022-05-10 NOTE — Telephone Encounter (Signed)
Glucometer, strips, supplies may write prescription or send this sent to [facility] please Has type 2 diabetes by Medicare guidelines strips may allow for 1 test per day But it is not recommended for him to check it every single day thank you

## 2022-05-12 MED ORDER — LANCETS MISC. MISC: 1 refills | Status: DC

## 2022-05-12 MED ORDER — BLOOD GLUCOSE TEST VI STRP: ORAL_STRIP | 3 refills | Status: DC

## 2022-05-12 MED ORDER — LANCET DEVICE MISC: 3 refills | Status: DC

## 2022-05-12 MED ORDER — BLOOD GLUCOSE MONITORING SUPPL DEVI: 1.0000 | Freq: Three times a day (TID) | 0 refills | Status: DC

## 2022-05-13 ENCOUNTER — Telehealth (HOSPITAL_COMMUNITY): Payer: Self-pay | Admitting: Student

## 2022-05-13 NOTE — Telephone Encounter (Signed)
   [facility] TIPS post-procedure phone call follow-up   [name]. is a 67 y.o. male who underwent TIPS revision with balloon angioplasty of TIPS endograft at [facility] on 05/09/22 by Dr. Serafina Royals.    Patient is 4 days out from his procedure.   # of paracentesis in last month: 5 including 1 during TIPS revision # of paracentesis in last 2 months: 9 including 1 during TIPS revision   Current diuretic regimen: none Current pharmacologic encephalopathy prophylaxis/treatment: Lactulose and rifaximin   Post-TIPS Imaging:   Labs: 04/10/22 Creatinine: 1.49 Total Bilirubin: 2.1 INR: 1.4 Sodium: 137 Albumin: 2.1 Ammonia: n/a  Assessment: [name]. is a 67 y.o. male with history of NASH CIRRHOSIS. Patient is 4 days from TIPS revision with balloon angioplasty of TIPS endograft and he is feeling well with minimal complaints. He states he is using the bathroom quite a lot which may be secondary to autodiuresis. He denies pain, nausea, shortness of breath and headaches. His appetite is fair. He denies abdominal distention and states his abdomen is the softest it's been in a long time. He is taking all of his medications as directed. He states he has no needs at this time. He knows I will call him next week to check in on him but that he can also call our clinic if he has any questions or concerns.   Recommendation:  Continue current treatment plan with next Clinic follow up scheduled for 05/20/22.   Electronically Signed: Theresa Duty, NP 05/13/2022, 2:20 PM

## 2022-05-16 ENCOUNTER — Telehealth: Payer: Self-pay | Admitting: *Deleted

## 2022-05-16 NOTE — Telephone Encounter (Signed)
That is fine to keep ultrasound as scheduled.

## 2022-05-16 NOTE — Telephone Encounter (Signed)
Noted. Informed pt.  

## 2022-05-16 NOTE — Telephone Encounter (Signed)
Routing in absence of [place].  Pt called and would like to know if he needs to have [place] tomorrow, because he had TIPS revision last week. Please advise.

## 2022-05-17 ENCOUNTER — Ambulatory Visit (HOSPITAL_COMMUNITY)
Admission: RE | Admit: 2022-05-17 | Discharge: 2022-05-17 | Disposition: A | Discharge status: Home / Self Care | Payer: Medicare Other | Source: Ambulatory Visit | Attending: Gastroenterology | Admitting: Gastroenterology

## 2022-05-17 DIAGNOSIS — R188 Other ascites: Secondary | ICD-10-CM | POA: Insufficient documentation

## 2022-05-17 DIAGNOSIS — K7460 Unspecified cirrhosis of liver: Secondary | ICD-10-CM | POA: Diagnosis not present

## 2022-05-17 DIAGNOSIS — K8020 Calculus of gallbladder without cholecystitis without obstruction: Secondary | ICD-10-CM | POA: Diagnosis not present

## 2022-05-18 ENCOUNTER — Inpatient Hospital Stay: Payer: Medicare Other

## 2022-05-18 ENCOUNTER — Inpatient Hospital Stay: Payer: Medicare Other | Attending: Hematology | Admitting: Hematology

## 2022-05-18 ENCOUNTER — Other Ambulatory Visit (HOSPITAL_COMMUNITY)
Admission: RE | Admit: 2022-05-18 | Discharge: 2022-05-18 | Disposition: A | Discharge status: Home / Self Care | Payer: Medicare Other | Source: Ambulatory Visit | Attending: Nephrology | Admitting: Nephrology

## 2022-05-18 VITALS — BP 136/78 | HR 88 | Temp 98.3°F | Resp 18 | Ht 66.0 in | Wt 225.1 lb

## 2022-05-18 DIAGNOSIS — E875 Hyperkalemia: Secondary | ICD-10-CM | POA: Insufficient documentation

## 2022-05-18 DIAGNOSIS — N189 Chronic kidney disease, unspecified: Secondary | ICD-10-CM | POA: Insufficient documentation

## 2022-05-18 DIAGNOSIS — D649 Anemia, unspecified: Secondary | ICD-10-CM

## 2022-05-18 DIAGNOSIS — E8722 Chronic metabolic acidosis: Secondary | ICD-10-CM | POA: Insufficient documentation

## 2022-05-18 DIAGNOSIS — E790 Hyperuricemia without signs of inflammatory arthritis and tophaceous disease: Secondary | ICD-10-CM | POA: Insufficient documentation

## 2022-05-18 DIAGNOSIS — K7581 Nonalcoholic steatohepatitis (NASH): Secondary | ICD-10-CM | POA: Diagnosis not present

## 2022-05-18 DIAGNOSIS — I5032 Chronic diastolic (congestive) heart failure: Secondary | ICD-10-CM | POA: Insufficient documentation

## 2022-05-18 DIAGNOSIS — E1122 Type 2 diabetes mellitus with diabetic chronic kidney disease: Secondary | ICD-10-CM | POA: Insufficient documentation

## 2022-05-18 DIAGNOSIS — D696 Thrombocytopenia, unspecified: Secondary | ICD-10-CM

## 2022-05-18 DIAGNOSIS — K7460 Unspecified cirrhosis of liver: Secondary | ICD-10-CM | POA: Insufficient documentation

## 2022-05-18 LAB — HEPATITIS C ANTIBODY: HCV Ab: NONREACTIVE

## 2022-05-18 LAB — CBC
HCT: 29.8 % — ABNORMAL LOW (ref 39.0–52.0)
Hemoglobin: 10.2 g/dL — ABNORMAL LOW (ref 13.0–17.0)
MCH: 32.5 pg (ref 26.0–34.0)
MCHC: 34.2 g/dL (ref 30.0–36.0)
MCV: 94.9 fL (ref 80.0–100.0)
Platelets: 133 10*3/uL — ABNORMAL LOW (ref 150–400)
RBC: 3.14 MIL/uL — ABNORMAL LOW (ref 4.22–5.81)
RDW: 15.7 % — ABNORMAL HIGH (ref 11.5–15.5)
WBC: 7.2 10*3/uL (ref 4.0–10.5)
nRBC: 0 % (ref 0.0–0.2)

## 2022-05-18 LAB — RENAL FUNCTION PANEL
Albumin: 2.1 g/dL — ABNORMAL LOW (ref 3.5–5.0)
Anion gap: 6 (ref 5–15)
BUN: 19 mg/dL (ref 8–23)
CO2: 23 mmol/L (ref 22–32)
Calcium: 8.4 mg/dL — ABNORMAL LOW (ref 8.9–10.3)
Chloride: 110 mmol/L (ref 98–111)
Creatinine, Ser: 1.3 mg/dL — ABNORMAL HIGH (ref 0.61–1.24)
GFR, Estimated: >60 mL/min (ref >60)
Glucose, Bld: 85 mg/dL (ref 70–99)
Phosphorus: 2.8 mg/dL (ref 2.5–4.6)
Potassium: 3.3 mmol/L — ABNORMAL LOW (ref 3.5–5.1)
Sodium: 139 mmol/L (ref 135–145)

## 2022-05-18 LAB — RETICULOCYTES
Immature Retic Fract: 20.8 % — ABNORMAL HIGH (ref 2.3–15.9)
RBC.: 3.1 MIL/uL — ABNORMAL LOW (ref 4.22–5.81)
Retic Count, Absolute: 102.9 10*3/uL (ref 19.0–186.0)
Retic Ct Pct: 3.3 % — ABNORMAL HIGH (ref 0.4–3.1)

## 2022-05-18 LAB — VITAMIN B12: Vitamin B-12: 1357 pg/mL — ABNORMAL HIGH (ref 180–914)

## 2022-05-18 LAB — HEPATITIS B CORE ANTIBODY, TOTAL: Hep B Core Total Ab: NONREACTIVE

## 2022-05-18 LAB — FOLATE: Folate: 15.6 ng/mL (ref >5.9)

## 2022-05-18 LAB — IMMATURE PLATELET FRACTION: Immature Platelet Fraction: 0.6 % — ABNORMAL LOW (ref 1.2–8.6)

## 2022-05-18 LAB — LACTATE DEHYDROGENASE: LDH: 171 U/L (ref 98–192)

## 2022-05-18 LAB — DIRECT ANTIGLOBULIN TEST (NOT AT ARMC)
DAT, IgG: NEGATIVE
DAT, complement: NEGATIVE

## 2022-05-18 LAB — HEPATITIS B SURFACE ANTIBODY,QUALITATIVE: Hep B S Ab: REACTIVE — AB

## 2022-05-18 LAB — HEPATITIS B SURFACE ANTIGEN: Hepatitis B Surface Ag: NONREACTIVE

## 2022-05-18 NOTE — Patient Instructions (Addendum)
[place]  Discharge Instructions  You were seen and examined today by Dr. Delton Coombes. Dr. Delton Coombes is a hematologist, meaning that he specializes in blood abnormalities. Dr. Delton Coombes discussed your past medical history, family history of cancers/blood conditions and the events that led to you being here today.  You were referred to Dr. Delton Coombes due to anemia.  Dr. Delton Coombes has recommended additional labs today for further evaluation. Dr. Delton Coombes would also like for you to collect your stool to ensure you are not losing blood through your stool.  Follow-up as scheduled.  Thank you for choosing [facility] to provide your oncology and hematology care.   To afford each patient quality time with our provider, please arrive at least 15 minutes before your scheduled appointment time. You may need to reschedule your appointment if you arrive late (10 or more minutes). Arriving late affects you and other patients whose appointments are after yours.  Also, if you miss three or more appointments without notifying the office, you may be dismissed from the clinic at the provider's discretion.    Again, thank you for choosing [facility].  Our hope is that these requests will decrease the amount of time that you wait before being seen by our physicians.   If you have a lab appointment with the [facility] please come in thru the Main Entrance and check in at the main information desk.           _____________________________________________________________  Should you have questions after your visit to [facility], please contact our office at [phone] and follow the prompts.  Our office hours are 8:00 a.m. to 4:30 p.m. Monday - Thursday and 8:00 a.m. to 2:30 p.m. Friday.  Please note that voicemails left after 4:00 p.m. may not be returned until the following business day.  We are closed weekends and all  major holidays.  You do have access to a nurse 24-7, just call the main number to the clinic [phone] and do not press any options, hold on the line and a nurse will answer the phone.    For prescription refill requests, have your pharmacy contact our office and allow 72 hours.    Masks are optional in the cancer centers. If you would like for your care team to wear a mask while they are taking care of you, please let them know. You may have one support person who is at least 65 years old accompany you for your appointments.

## 2022-05-18 NOTE — Progress Notes (Signed)
Shawn [name] [address] Shawn Shawn [name] [address]   Clinic Day:  05/18/2022  Referring physician: [name], MD  Patient Care Team: Shawn Drown, MD as PCP - General (Family Medicine) Shawn Romney Cristopher Estimable, MD as Consulting Physician (Gastroenterology) Shawn Gerold, MD as Consulting Physician (Nephrology) Shawn Jack, MD as Medical Oncologist (Hematology)   ASSESSMENT & PLAN:   Assessment:  1.  Normocytic anemia and mild thrombocytopenia: - PMH: NASH cirrhosis +/- EtOH, TIPS placement, CKD - Hemoglobin 11.2, MCV 92 on 05/03/2022. - Ferritin 191, percent saturation 64 on 05/06/2022. - Ultrasound spleen (10/15/2021): 12 x 14 x 6 cm, volume 550 cm - Denies any bleeding per rectum or melena.  No prior transfusion history.  No parenteral iron therapy.  He is not on oral iron therapy.  No ice pica. - No prior colonoscopy.   2.  Social/family history: - Lives at home with his wife.  He is a retired Architectural technologist, worked at a [employer].  Non-smoker.  Exposure to some cleaning chemicals. - No family history of transfusion dependent anemia.  Mother had breast cancer and colon cancer.  Father had cancer.  Half sister had breast cancer.  Brother and sister had cirrhosis.  Plan:  1.  Normocytic anemia: - Multifactorial anemia with differential including CKD, blood loss. - Recommend checking folic acid, MMA, copper.  Will also check for bone marrow infiltrative processes including SPEP and free light chains.  Will check for hemolysis with direct Coombs test, LDH and reticulocyte count.  Will also check stool for occult blood. - RTC 2 weeks to discuss results.  2.  Mild thrombocytopenia: - Most likely from splenic sequestration.  However will check ANA, rheumatoid factor, hepatitis B and C serology.  Will also check immature platelet fraction.   Orders Placed This Encounter  Procedures   Lactate dehydrogenase    Standing Status:    Future    Number of Occurrences:   1    Standing Expiration Date:   05/18/2023   Reticulocytes    Standing Status:   Future    Number of Occurrences:   1    Standing Expiration Date:   05/18/2023   Protein electrophoresis, serum    Standing Status:   Future    Number of Occurrences:   1    Standing Expiration Date:   05/18/2023   Vitamin B12    Standing Status:   Future    Number of Occurrences:   1    Standing Expiration Date:   05/18/2023   Folate    Standing Status:   Future    Number of Occurrences:   1    Standing Expiration Date:   05/18/2023   Methylmalonic acid, serum    Standing Status:   Future    Number of Occurrences:   1    Standing Expiration Date:   05/18/2023   Copper, serum    Standing Status:   Future    Number of Occurrences:   1    Standing Expiration Date:   05/18/2023   Occult blood x 1 card to lab, stool    Standing Status:   Future    Standing Expiration Date:   05/18/2023    Order Specific Question:   Release to patient    Answer:   Immediate   Occult blood x 1 card to lab, stool    Standing Status:   Future    Standing Expiration Date:  05/18/2023    Order Specific Question:   Release to patient    Answer:   Immediate   Occult blood x 1 card to lab, stool    Standing Status:   Future    Standing Expiration Date:   05/18/2023    Order Specific Question:   Release to patient    Answer:   Immediate   ANA, IFA (with reflex)    Standing Status:   Future    Number of Occurrences:   1    Standing Expiration Date:   05/18/2023    Order Specific Question:   Release to patient    Answer:   Immediate [1]   Rheumatoid factor    Standing Status:   Future    Number of Occurrences:   1    Standing Expiration Date:   05/18/2023    Order Specific Question:   Release to patient    Answer:   Immediate [1]    Order Specific Question:   Remote health to draw?    Answer:   No   Hepatitis B surface antigen    Standing Status:   Future    Number of Occurrences:   1     Standing Expiration Date:   05/18/2023    Order Specific Question:   Release to patient    Answer:   Immediate [1]    Order Specific Question:   Remote health to draw?    Answer:   No   Hepatitis B core antibody, total    Standing Status:   Future    Number of Occurrences:   1    Standing Expiration Date:   05/18/2023    Order Specific Question:   Release to patient    Answer:   Immediate [1]    Order Specific Question:   Remote health to draw?    Answer:   No   Hepatitis B surface antibody    Standing Status:   Future    Number of Occurrences:   1    Standing Expiration Date:   05/18/2023    Order Specific Question:   Release to patient    Answer:   Immediate [1]    Order Specific Question:   Remote health to draw?    Answer:   No   Hepatitis C Antibody    Standing Status:   Future    Number of Occurrences:   1    Standing Expiration Date:   05/18/2023    Order Specific Question:   Release to patient    Answer:   Immediate [1]    Order Specific Question:   Remote health to draw?    Answer:   No   Kappa/lambda light chains    Standing Status:   Future    Number of Occurrences:   1    Standing Expiration Date:   05/18/2023    Order Specific Question:   Release to patient    Answer:   Immediate [1]    Order Specific Question:   Remote health to draw?    Answer:   No   Immature Platelet Fraction    Standing Status:   Future    Number of Occurrences:   1    Standing Expiration Date:   05/18/2023   Direct antiglobulin test    Standing Status:   Future    Number of Occurrences:   1    Standing Expiration Date:   05/18/2023  I,Shawn Shawn [name],acting as a Education administrator for [employer], MD.,have documented all relevant documentation on the behalf of Shawn Jack, MD,as directed by  Shawn Jack, MD while in the presence of Shawn Jack, MD.   I, Shawn Jack MD, have reviewed the above documentation for accuracy and completeness, and I agree with  the above.   Shawn Jack, MD   3/13/20246:09 PM  CHIEF COMPLAINT/PURPOSE OF CONSULT:   Diagnosis: Normocytic anemia and mild thrombocytopenia  Current Therapy: Under workup  HISTORY OF PRESENT ILLNESS:   Shawn Shawn [name] is a 67 y.o. male presenting to clinic today for evaluation of anemia at the request of Shawn [name].  Today, he states that he is doing well overall. His appetite level is at 80%. His energy level is at 50%.  His iron panel showed an iron of 87, TIBC of 136, saturation of 64%, and ferritin of 191 on 05/06/22. He was found to have abnormal CBC from 05/03/22: RBC was 3.54, HGB of 11.2, and HCT of 32.7.  He denies any bleeding per rectum or melena.  Never had colonoscopy.  No prior transfusion history.  No ice pica.  Never received parenteral iron therapy.  PAST MEDICAL HISTORY:   Past Medical History: Past Medical History:  Diagnosis Date   ADD (attention deficit disorder)    ADHD    Anxiety 1987   mild (crowds, noises)   Anxiety    Attention deficit disorder    Cancer ([place])    Cirrhosis of liver (HCC)    CVA (cerebral vascular accident) ([place])    Depression    Diabetic neuropathy ([place])    Essential hypertension, benign    Long-standing history, negative secondary workup   Headache    History of cardiac catheterization    Widely patent coronary and renal arteries November 2013   Hyperlipidemia    Hypertension    whitecoat   Obstructive sleep apnea    cpap   Prediabetes    Sleep apnea    TIA (transient ischemic attack)    Possible, 2007   Type 2 diabetes mellitus (HCC)    Venous stasis     Surgical History: Past Surgical History:  Procedure Laterality Date   ANTERIOR CERVICAL DECOMP/DISCECTOMY FUSION N/A 12/30/2015   Procedure: ANTERIOR CERVICAL DECOMPRESSION/DISCECTOMY FUSION C6 - C7 1 LEVEL;  Surgeon: Melina Schools, MD;  Location: [facility];  Service: Orthopedics;  Laterality: N/A;   CARDIAC CATHETERIZATION  01/2012   negative   CARPAL TUNNEL RELEASE      CERVICAL SPINE NERVE BLOCK  05/17/2017   IR INTRAVASCULAR ULTRASOUND NON CORONARY  02/14/2022   IR PARACENTESIS  02/14/2022   IR PARACENTESIS  05/09/2022   IR RADIOLOGIST EVAL & MGMT  12/03/2021   IR RADIOLOGIST EVAL & MGMT  01/13/2022   IR RADIOLOGIST EVAL & MGMT  03/24/2022   IR RADIOLOGIST EVAL & MGMT  04/20/2022   IR TIPS  02/14/2022   IR TIPS REVISION MOD SED  05/09/2022   IR US GUIDE VASC ACCESS RIGHT  02/14/2022   IR US GUIDE VASC ACCESS RIGHT  02/14/2022   IR US GUIDE VASC ACCESS RIGHT  05/09/2022   KNEE SURGERY Right    NECK SURGERY     RADIOLOGY WITH ANESTHESIA N/A 02/14/2022   Procedure: TIPS;  Surgeon: Suzette Battiest, MD;  Location: [facility];  Service: Radiology;  Laterality: N/A;   VASECTOMY      Social History: Social History   Socioeconomic History   Marital status: Married    Spouse name:  Not on file   Number of children: 4   Years of education: Not on file   Highest education level: Not on file  Occupational History   Occupation: maintainer   Tobacco Use   Smoking status: Never   Smokeless tobacco: Current    Types: Snuff   Tobacco comments:    Uses dip pouches occasionally  Vaping Use   Vaping Use: Never used  Substance and Sexual Activity   Alcohol use: No    Alcohol/week: 0.0 standard drinks of alcohol    Comment: Previously 1-2 cases of beer/week. patient states never on regular basis though. last etoh in 2013.    Drug use: No   Sexual activity: Not Currently  Other Topics Concern   Not on file  Social History Narrative   ** Merged History Encounter **       Social Determinants of Health   Financial Resource Strain: Low Risk  (01/04/2022)   Overall Financial Resource Strain (CARDIA)    Difficulty of Paying Living Expenses: Not hard at all  Food Insecurity: No Food Insecurity (05/18/2022)   Hunger Vital Sign    Worried About Running Out of Food in the Last Year: Never true    Ran Out of Food in the Last Year: Never true  Transportation Needs: No  Transportation Needs (05/18/2022)   PRAPARE - Hydrologist (Medical): No    Lack of Transportation (Non-Medical): No  Physical Activity: Inactive (01/04/2022)   Exercise Vital Sign    Days of Exercise per Week: 0 days    Minutes of Exercise per Session: 0 min  Stress: No Stress Concern Present (01/04/2022)   [place]    Feeling of Stress : Only a little  Social Connections: Moderately Integrated (01/04/2022)   Social Connection and Isolation Panel [NHANES]    Frequency of Communication with Friends and Family: More than three times a week    Frequency of Social Gatherings with Friends and Family: Once a week    Attends Religious Services: More than 4 times per year    Active Member of [employer] or Organizations: No    Attends Archivist Meetings: Never    Marital Status: Married  Human resources officer Violence: Not At Risk (05/18/2022)   Humiliation, Afraid, Rape, and Kick questionnaire    Fear of Current or Ex-Partner: No    Emotionally Abused: No    Physically Abused: No    Sexually Abused: No    Family History: Family History  Problem Relation Age of Onset   Colon cancer Mother    Hypertension Mother    CAD Mother    Breast cancer Mother    Cirrhosis Brother     Current Medications:  Current Outpatient Medications:    ascorbic Acid (VITAMIN C) 500 MG CPCR, Take 500 mg by mouth daily., Disp: , Rfl:    aspirin EC 81 MG tablet, Take 81 mg by mouth daily. Swallow whole., Disp: , Rfl:    Blood Glucose Monitoring Suppl DEVI, 1 each by Does not apply route in the morning, at noon, and at bedtime. May substitute to any manufacturer covered by patient's insurance Dx E11.22, Disp: 1 each, Rfl: 0   Cholecalciferol (D3 VITAMIN PO), Take by mouth daily. 5000 IU daily, Disp: , Rfl:    dorzolamide (TRUSOPT) 2 % ophthalmic solution, 1 drop 2 (two) times daily., Disp: , Rfl:    fluticasone  (FLONASE) 50 MCG/ACT nasal  spray, Place 2 sprays into both nostrils daily., Disp: 16 g, Rfl: 6   Glucose Blood (BLOOD GLUCOSE TEST STRIPS) STRP, Use to check blood sugar once daily before meals. May substitute to any manufacturer covered by patient's insurance E11.22, Disp: 100 strip, Rfl: 3   lactulose (CHRONULAC) 10 GM/15ML solution, Take 15 mLs (10 g total) by mouth 3 (three) times daily., Disp: 1350 mL, Rfl: 11   Lactulose 20 GM/30ML SOLN, Take 15 mLs (10 g total) by mouth in the morning, at noon, and at bedtime., Disp: 1350 mL, Rfl: 2   Lancet Device MISC, Use to check blood sugar once daily before meals. May substitute to any manufacturer covered by patient's insurance E11.22, Disp: 100 each, Rfl: 3   Lancets Misc. MISC, Use to check blood sugar once daily before meals. May substitute to any manufacturer covered by patient's insurance, Disp: 100 each, Rfl: 1   metFORMIN (GLUCOPHAGE) 500 MG tablet, 1 qd (Patient taking differently: Take 500 mg by mouth daily with breakfast.), Disp: 90 tablet, Rfl: 1   Milk Thistle 1000 MG CAPS, Take by mouth daily., Disp: , Rfl:    Omega-3 Fatty Acids (FISH OIL) 1000 MG CAPS, Take by mouth daily., Disp: , Rfl:    pantoprazole (PROTONIX) 40 MG tablet, Take 1 tablet (40 mg total) by mouth daily., Disp: 90 tablet, Rfl: 1   sodium bicarbonate 650 MG tablet, Take 650 mg by mouth 3 (three) times daily. Morning, evening, bedtime, Disp: , Rfl:    venlafaxine XR (EFFEXOR-XR) 150 MG 24 hr capsule, Take 1 capsule (150 mg total) by mouth 2 (two) times daily., Disp: 180 capsule, Rfl: 1   vitamin E 180 MG (400 UNITS) capsule, Take 400 Units by mouth daily., Disp: , Rfl:    zinc gluconate 50 MG tablet, Take 50 mg by mouth daily., Disp: , Rfl:    rifaximin (XIFAXAN) 550 MG TABS tablet, Take 1 tablet (550 mg total) by mouth 2 (two) times daily. (Patient not taking: Reported on 05/18/2022), Disp: 60 tablet, Rfl: 11   Allergies: No Known Allergies  REVIEW OF SYSTEMS:   Review  of Systems  Constitutional:  Negative for chills, fatigue and fever.  HENT:   Negative for lump/mass, mouth sores, nosebleeds, sore throat and trouble swallowing.   Eyes:  Negative for eye problems.  Respiratory:  Negative for cough and shortness of breath.   Cardiovascular:  Positive for leg swelling. Negative for chest pain and palpitations.  Gastrointestinal:  Positive for nausea. Negative for abdominal pain, constipation, diarrhea and vomiting.  Genitourinary:  Negative for bladder incontinence, difficulty urinating, dysuria, frequency, hematuria and nocturia.   Musculoskeletal:  Negative for arthralgias, back pain, flank pain, myalgias and neck pain.  Skin:  Negative for itching and rash.  Neurological:  Positive for dizziness and numbness. Negative for headaches.  Hematological:  Does not bruise/bleed easily.  Psychiatric/Behavioral:  Positive for sleep disturbance. Negative for depression and suicidal ideas. The patient is not nervous/anxious.   All other systems reviewed and are negative.    VITALS:   Blood pressure 136/78, pulse 88, temperature 98.3 F (36.8 C), temperature source Oral, resp. rate 18, height '5\' 6"'$  (1.676 m), weight 225 lb 1.6 oz (102.1 kg), SpO2 100 %.  Wt Readings from Last 3 Encounters:  05/18/22 225 lb 1.6 oz (102.1 kg)  05/09/22 225 lb (102.1 kg)  05/04/22 233 lb 3.2 oz (105.8 kg)    Body mass index is 36.33 kg/m.   PHYSICAL EXAM:   Physical  Exam Vitals and nursing note reviewed. Exam conducted with a chaperone present.  Constitutional:      Appearance: Normal appearance.  Cardiovascular:     Rate and Rhythm: Normal rate and regular rhythm.     Pulses: Normal pulses.     Heart sounds: Normal heart sounds.  Pulmonary:     Effort: Pulmonary effort is normal.     Breath sounds: Normal breath sounds.  Abdominal:     General: There is distension.     Palpations: Abdomen is soft. There is no hepatomegaly, splenomegaly or mass.     Tenderness: There  is no abdominal tenderness.  Musculoskeletal:     Right lower leg: Edema present.     Left lower leg: Edema present.  Lymphadenopathy:     Cervical: No cervical adenopathy.     Right cervical: No superficial, deep or posterior cervical adenopathy.    Left cervical: No superficial, deep or posterior cervical adenopathy.     Upper Body:     Right upper body: No supraclavicular or axillary adenopathy.     Left upper body: No supraclavicular or axillary adenopathy.  Neurological:     General: No focal deficit present.     Mental Status: He is alert and oriented to person, place, and time.  Psychiatric:        Mood and Affect: Mood normal.        Behavior: Behavior normal.     LABS:      Latest Ref Rng & Units 05/18/2022    2:09 PM 05/03/2022    9:03 AM 04/11/2022   10:42 AM  CBC  WBC 4.0 - 10.5 K/uL 7.2  6.6  5.2   Hemoglobin 13.0 - 17.0 g/dL 10.2  11.2  10.8   Hematocrit 39.0 - 52.0 % 29.8  32.7  30.9   Platelets 150 - 400 K/uL 133  164  160       Latest Ref Rng & Units 05/18/2022    2:10 PM 05/09/2022   11:02 AM 04/11/2022   10:42 AM  CMP  Glucose 70 - 99 mg/dL 85  69  130   BUN 8 - 23 mg/dL '19  17  18   '$ Creatinine 0.61 - 1.24 mg/dL 1.30  1.49  1.47   Sodium 135 - 145 mmol/L 139  137  142   Potassium 3.5 - 5.1 mmol/L 3.3  3.2  3.8   Chloride 98 - 111 mmol/L 110  106  108   CO2 22 - 32 mmol/L '23  27  23   '$ Calcium 8.9 - 10.3 mg/dL 8.4  8.4  8.7   Total Protein 6.5 - 8.1 g/dL  5.6  5.2   Total Bilirubin 0.3 - 1.2 mg/dL  2.1  1.3   Alkaline Phos 38 - 126 U/L  138  162   AST 15 - 41 U/L  56  54   ALT 0 - 44 U/L  28  31      No results found for: "CEA1", "CEA" / No results found for: "CEA1", "CEA" Lab Results  Component Value Date   PSA1 0.5 12/28/2020   No results found for: "[id number]" No results found for: "CAN125"  No results found for: "TOTALPROTELP", "ALBUMINELP", "A1GS", "A2GS", "BETS", "BETA2SER", "GAMS", "MSPIKE", "SPEI" Lab Results  Component Value Date   TIBC  136 (L) 05/06/2022   TIBC 374 12/18/2014   FERRITIN 191 05/06/2022   FERRITIN 96 12/18/2014   IRONPCTSAT 64 (H) 05/06/2022   IRONPCTSAT 33  12/18/2014   Lab Results  Component Value Date   LDH 171 05/18/2022     STUDIES:   US Abdomen Limited RUQ (LIVER/GB)  Result Date: 05/17/2022 CLINICAL DATA:  Cirrhosis.  [place] screening. EXAM: ULTRASOUND ABDOMEN LIMITED RIGHT UPPER QUADRANT COMPARISON:  Ultrasound paracentesis 05/04/2022 FINDINGS: Gallbladder: Note is made of a 6 mm gallstone. No gallbladder wall thickening or pericholecystic fluid. Negative sonographic Murphy's sign. Common bile duct: Diameter: 3.7 mm Liver: Cirrhotic morphology of the liver. No focal lesion. Portal vein is patent on color Doppler imaging with normal direction of blood flow towards the liver. Other: Tips stent is visualized. Moderate volume ascites. IMPRESSION: 1. Cirrhotic morphology of the liver. No focal lesion. 2. Cholelithiasis without secondary signs of acute cholecystitis. 3. Moderate volume ascites. Electronically Signed   By: Lovey Newcomer M.D.   On: 05/17/2022 10:01   IR TIPS REVISION MOD SED  Result Date: 05/09/2022 CLINICAL DATA:  67 year old male with history of NASH cirrhosis and recurrent ascites status post TIPS creation on 02/14/2022 presenting with persistent refractory ascites despite prior intervention. EXAM: 1. Ultrasound-guided paracentesis 2. Portal venogram 3. Central and portal manometry 4. Balloon angioplasty of TIPS endograft MEDICATIONS: None. ANESTHESIA/SEDATION: Moderate (conscious) sedation was employed during this procedure. A total of Versed 1 mg and Fentanyl 50 mcg was administered intravenously. Moderate Sedation Time: 40 minutes. The patient's level of consciousness and vital signs were monitored continuously by radiology nursing throughout the procedure under my direct supervision. CONTRAST:  40 mL Omnipaque 300, intravenous FLUOROSCOPY TIME:  One hundred eighty-six mGy COMPLICATIONS: None  immediate. PROCEDURE: Informed written consent was obtained from the patient after a thorough discussion of the procedural risks, benefits and alternatives. All questions were addressed. Maximal Sterile Barrier Technique was utilized including caps, mask, sterile gowns, sterile gloves, sterile drape, hand hygiene and skin antiseptic. A timeout was performed prior to the initiation of the procedure. Preprocedure ultrasound evaluation of the right lower quadrant demonstrate large volume ascites. Paracentesis was planned. Subdermal Local anesthesia was provided at the planned needle entry site. Deeper local anesthetic was administered under ultrasound guidance to the level of the peritoneum. Under direct ultrasound visualization, a 6 French Safe-T-Centesis catheter was directed into the peritoneum with immediate efflux of translucent, straw-colored fluid. A total of 9.6 L was drained throughout the procedure. Intravenous albumin was administered per protocol. Upon completion, the Safe-T-Centesis catheter was removed and a sterile bandage was applied. Preprocedure ultrasound evaluation demonstrated patency of the right internal jugular vein. Subdermal Local anesthesia was administered with 1% lidocaine at the planned needle entry site. A small skin nick was made. Under direct ultrasound visualization, a 21 gauge micropuncture needle was directed into the right internal jugular vein. A permanent image was captured and stored the record. A micropuncture sheath was then introduced and exchanged over a Rosen wire for a 6 [place], 45 cm angled destination sheath. With the sheath tip positioned in the right atrium, central manometry was performed (mean 11 mm Hg). A 5 [place] MPA catheter was then inserted and directed into the indwelling TIPS endograft over a Rosen wire. The catheter was exchanged for a marking pigtail catheter. Portal manometry was performed (mean 29 mmHg, mean portosystemic gradient 18 mm Hg). Portal venogram  was then performed which demonstrated patency of the indwelling stent. Given persistent poor hypertension, balloon molding was performed. Initially, an 8 mm x 8 cm Athletis balloon was inflated throughout the length of the stent. Central and portal manometry was then repeated after insertion of the  marking pigtail catheter which demonstrated reduction in mean portosystemic gradient from 18 to 16 mm Hg (right atrium 13 mm Hg, portal vein 29 mm Hg). Therefore, additional balloon dilation with a 10 mm by 8 cm Athletis balloon was performed. Completion central and portal manometry was then performed which demonstrated further reduction of mean portosystemic gradient to 8 mmHg (right atrium 12 mm Hg, portal vein 20 mm Hg). Completion portal venogram demonstrated widely patent TIPS endograft without evidence of competing varices. A Glidewire Advantage was inserted to straighten the pigtail catheter which was removed. The right IJ sheath was removed and hemostasis was achieved with brief manual compression. The patient tolerated procedure well was transferred to the recovery area in good condition. IMPRESSION: 1. Patent TIPS with persistent portal hypertension (mean portosystemic gradient of 18 mmHg). 2. Technically successful balloon molding to 10 mm with reduction of mean portosystemic gradient from 18 to 8 mmHg. 3. Technically successful ultrasound-guided paracentesis yielding 9.6 L ascites. PLAN: PLAN Follow-up in Portal Hypertension Clinic in 1 month. Ruthann Cancer, MD [facility] Electronically Signed   By: Ruthann Cancer M.D.   On: 05/09/2022 16:02   IR Paracentesis  Result Date: 05/09/2022 CLINICAL DATA:  67 year old male with history of NASH cirrhosis and recurrent ascites status post TIPS creation on 02/14/2022 presenting with persistent refractory ascites despite prior intervention. EXAM: 1. Ultrasound-guided paracentesis 2. Portal venogram 3. Central  and portal manometry 4. Balloon angioplasty of TIPS endograft MEDICATIONS: None. ANESTHESIA/SEDATION: Moderate (conscious) sedation was employed during this procedure. A total of Versed 1 mg and Fentanyl 50 mcg was administered intravenously. Moderate Sedation Time: 40 minutes. The patient's level of consciousness and vital signs were monitored continuously by radiology nursing throughout the procedure under my direct supervision. CONTRAST:  40 mL Omnipaque 300, intravenous FLUOROSCOPY TIME:  One hundred eighty-six mGy COMPLICATIONS: None immediate. PROCEDURE: Informed written consent was obtained from the patient after a thorough discussion of the procedural risks, benefits and alternatives. All questions were addressed. Maximal Sterile Barrier Technique was utilized including caps, mask, sterile gowns, sterile gloves, sterile drape, hand hygiene and skin antiseptic. A timeout was performed prior to the initiation of the procedure. Preprocedure ultrasound evaluation of the right lower quadrant demonstrate large volume ascites. Paracentesis was planned. Subdermal Local anesthesia was provided at the planned needle entry site. Deeper local anesthetic was administered under ultrasound guidance to the level of the peritoneum. Under direct ultrasound visualization, a 6 French Safe-T-Centesis catheter was directed into the peritoneum with immediate efflux of translucent, straw-colored fluid. A total of 9.6 L was drained throughout the procedure. Intravenous albumin was administered per protocol. Upon completion, the Safe-T-Centesis catheter was removed and a sterile bandage was applied. Preprocedure ultrasound evaluation demonstrated patency of the right internal jugular vein. Subdermal Local anesthesia was administered with 1% lidocaine at the planned needle entry site. A small skin nick was made. Under direct ultrasound visualization, a 21 gauge micropuncture needle was directed into the right internal jugular vein. A  permanent image was captured and stored the record. A micropuncture sheath was then introduced and exchanged over a Rosen wire for a 6 [place], 45 cm angled destination sheath. With the sheath tip positioned in the right atrium, central manometry was performed (mean 11 mm Hg). A 5 [place] MPA catheter was then inserted and directed into the indwelling TIPS endograft over a Rosen wire. The catheter was exchanged for a marking pigtail catheter. Portal manometry was performed (mean 29 mmHg, mean portosystemic gradient  18 mm Hg). Portal venogram was then performed which demonstrated patency of the indwelling stent. Given persistent poor hypertension, balloon molding was performed. Initially, an 8 mm x 8 cm Athletis balloon was inflated throughout the length of the stent. Central and portal manometry was then repeated after insertion of the marking pigtail catheter which demonstrated reduction in mean portosystemic gradient from 18 to 16 mm Hg (right atrium 13 mm Hg, portal vein 29 mm Hg). Therefore, additional balloon dilation with a 10 mm by 8 cm Athletis balloon was performed. Completion central and portal manometry was then performed which demonstrated further reduction of mean portosystemic gradient to 8 mmHg (right atrium 12 mm Hg, portal vein 20 mm Hg). Completion portal venogram demonstrated widely patent TIPS endograft without evidence of competing varices. A Glidewire Advantage was inserted to straighten the pigtail catheter which was removed. The right IJ sheath was removed and hemostasis was achieved with brief manual compression. The patient tolerated procedure well was transferred to the recovery area in good condition. IMPRESSION: 1. Patent TIPS with persistent portal hypertension (mean portosystemic gradient of 18 mmHg). 2. Technically successful balloon molding to 10 mm with reduction of mean portosystemic gradient from 18 to 8 mmHg. 3. Technically successful ultrasound-guided paracentesis yielding 9.6 L  ascites. PLAN: PLAN Follow-up in Portal Hypertension Clinic in 1 month. Ruthann Cancer, MD [facility] Radiology Electronically Signed   By: Ruthann Cancer M.D.   On: 05/09/2022 16:02   IR US Guide Vasc Access Right  Result Date: 05/09/2022 CLINICAL DATA:  67 year old male with history of NASH cirrhosis and recurrent ascites status post TIPS creation on 02/14/2022 presenting with persistent refractory ascites despite prior intervention. EXAM: 1. Ultrasound-guided paracentesis 2. Portal venogram 3. Central and portal manometry 4. Balloon angioplasty of TIPS endograft MEDICATIONS: None. ANESTHESIA/SEDATION: Moderate (conscious) sedation was employed during this procedure. A total of Versed 1 mg and Fentanyl 50 mcg was administered intravenously. Moderate Sedation Time: 40 minutes. The patient's level of consciousness and vital signs were monitored continuously by radiology nursing throughout the procedure under my direct supervision. CONTRAST:  40 mL Omnipaque 300, intravenous FLUOROSCOPY TIME:  One hundred eighty-six mGy COMPLICATIONS: None immediate. PROCEDURE: Informed written consent was obtained from the patient after a thorough discussion of the procedural risks, benefits and alternatives. All questions were addressed. Maximal Sterile Barrier Technique was utilized including caps, mask, sterile gowns, sterile gloves, sterile drape, hand hygiene and skin antiseptic. A timeout was performed prior to the initiation of the procedure. Preprocedure ultrasound evaluation of the right lower quadrant demonstrate large volume ascites. Paracentesis was planned. Subdermal Local anesthesia was provided at the planned needle entry site. Deeper local anesthetic was administered under ultrasound guidance to the level of the peritoneum. Under direct ultrasound visualization, a 6 French Safe-T-Centesis catheter was directed into the peritoneum with immediate efflux of translucent,  straw-colored fluid. A total of 9.6 L was drained throughout the procedure. Intravenous albumin was administered per protocol. Upon completion, the Safe-T-Centesis catheter was removed and a sterile bandage was applied. Preprocedure ultrasound evaluation demonstrated patency of the right internal jugular vein. Subdermal Local anesthesia was administered with 1% lidocaine at the planned needle entry site. A small skin nick was made. Under direct ultrasound visualization, a 21 gauge micropuncture needle was directed into the right internal jugular vein. A permanent image was captured and stored the record. A micropuncture sheath was then introduced and exchanged over a Rosen wire for a 6 [place], 45 cm angled destination sheath.  With the sheath tip positioned in the right atrium, central manometry was performed (mean 11 mm Hg). A 5 [place] MPA catheter was then inserted and directed into the indwelling TIPS endograft over a Rosen wire. The catheter was exchanged for a marking pigtail catheter. Portal manometry was performed (mean 29 mmHg, mean portosystemic gradient 18 mm Hg). Portal venogram was then performed which demonstrated patency of the indwelling stent. Given persistent poor hypertension, balloon molding was performed. Initially, an 8 mm x 8 cm Athletis balloon was inflated throughout the length of the stent. Central and portal manometry was then repeated after insertion of the marking pigtail catheter which demonstrated reduction in mean portosystemic gradient from 18 to 16 mm Hg (right atrium 13 mm Hg, portal vein 29 mm Hg). Therefore, additional balloon dilation with a 10 mm by 8 cm Athletis balloon was performed. Completion central and portal manometry was then performed which demonstrated further reduction of mean portosystemic gradient to 8 mmHg (right atrium 12 mm Hg, portal vein 20 mm Hg). Completion portal venogram demonstrated widely patent TIPS endograft without evidence of competing varices. A  Glidewire Advantage was inserted to straighten the pigtail catheter which was removed. The right IJ sheath was removed and hemostasis was achieved with brief manual compression. The patient tolerated procedure well was transferred to the recovery area in good condition. IMPRESSION: 1. Patent TIPS with persistent portal hypertension (mean portosystemic gradient of 18 mmHg). 2. Technically successful balloon molding to 10 mm with reduction of mean portosystemic gradient from 18 to 8 mmHg. 3. Technically successful ultrasound-guided paracentesis yielding 9.6 L ascites. PLAN: PLAN Follow-up in Portal Hypertension Clinic in 1 month. Ruthann Cancer, MD [facility] Radiology Electronically Signed   By: Ruthann Cancer M.D.   On: 05/09/2022 16:02   US Paracentesis  Result Date: 05/04/2022 INDICATION: Recurrent ascites; cirrhosis EXAM: ULTRASOUND GUIDED RLQ PARACENTESIS MEDICATIONS: 10 cc 1% lidocaine COMPLICATIONS: None immediate. PROCEDURE: Informed written consent was obtained from the patient after a discussion of the risks, benefits and alternatives to treatment. A timeout was performed prior to the initiation of the procedure. Initial ultrasound scanning demonstrates a large amount of ascites within the right lower abdominal quadrant. The right lower abdomen was prepped and draped in the usual sterile fashion. 1% lidocaine was used for local anesthesia. Following this, a 7 cm Yueh catheter was introduced. An ultrasound image was saved for documentation purposes. The paracentesis was performed. The catheter was removed and a dressing was applied. The patient tolerated the procedure well without immediate post procedural complication. Patient received post-procedure intravenous albumin; see nursing notes for details. FINDINGS: A total of approximately 4 liters (max) of yellow fluid was removed. Samples were sent to the laboratory as requested by the clinical team.  IMPRESSION: Successful ultrasound-guided paracentesis yielding 4 liters of peritoneal fluid. TIPs procedure performed in IR 02/15/23 Read by Lavonia Drafts [facility] Electronically Signed   By: Lavonia Dana M.D.   On: 05/04/2022 14:20   US ABDOMINAL PELVIC ART/VENT FLOW DOPPLER  Result Date: 04/29/2022 CLINICAL DATA:  67 year old male with history of NASH cirrhosis and refractory ascites status post TIPS placement. EXAM: DUPLEX ULTRASOUND OF LIVER AND TIPS SHUNT TECHNIQUE: Color and duplex Doppler ultrasound was performed to evaluate the hepatic in-flow and out-flow vessels. COMPARISON:  02/14/2022, 10/15/2021 FINDINGS: Portal Vein Velocities Main:  65 cm/sec, antegrade Right:  114 cm/sec, antegrade Left:  18 cm/sec, retrograde TIPS Stent Velocities Portal: 114 cm/sec Mid:  127 Central: 149 cm/sec IVC:  Present and patent with normal respiratory phasicity. Hepatic Vein Velocities Right:  55 cm/sec Mid:  149 cm/sec Left:  42 cm/sec Splenic Vein: Patent. Superior Mesenteric Vein: Not visualized. Hepatic Artery: 81 centimeters/second Ascities: Present Varices: Absent Other findings: Similar cirrhotic appearance of the liver. IMPRESSION: Patent indwelling TIPS endograft. Ruthann Cancer, MD [facility] Radiology Electronically Signed   By: Ruthann Cancer M.D.   On: 04/29/2022 12:41   US Paracentesis  Result Date: 04/20/2022 INDICATION: Ascites/ Cirrhosis EXAM: ULTRASOUND GUIDED RLQ PARACENTESIS MEDICATIONS: 10 cc 1% lidocaine. COMPLICATIONS: None immediate. PROCEDURE: Informed written consent was obtained from the patient after a discussion of the risks, benefits and alternatives to treatment. A timeout was performed prior to the initiation of the procedure. Initial ultrasound scanning demonstrates a large amount of ascites within the right lower abdominal quadrant. The right lower abdomen was prepped and draped in the usual sterile fashion. 1% lidocaine was used for local  anesthesia. Following this, a 7 cm Yueh catheter was introduced. An ultrasound image was saved for documentation purposes. The paracentesis was performed. The catheter was removed and a dressing was applied. The patient tolerated the procedure well without immediate post procedural complication. Patient received post-procedure intravenous albumin; see nursing notes for details. FINDINGS: A total of approximately 4 liters (max per MD) of yellow fluid was removed. Samples were sent to the laboratory as requested by the clinical team. IMPRESSION: Successful ultrasound-guided paracentesis yielding 4 liters of peritoneal fluid. Read by Lavonia Drafts [facility] Electronically Signed   By: Titus Dubin M.D.   On: 04/20/2022 10:55   IR Radiologist Eval & Mgmt  Result Date: 04/20/2022 EXAM: ESTABLISHED PATIENT OFFICE VISIT CHIEF COMPLAINT: See Epic note. HISTORY OF PRESENT ILLNESS: See Epic note. REVIEW OF SYSTEMS: See Epic note. PHYSICAL EXAMINATION: See Epic note. ASSESSMENT AND PLAN: See Epic note. Ruthann Cancer, MD [facility] Radiology Electronically Signed   By: Ruthann Cancer M.D.   On: 04/20/2022 08:50

## 2022-05-19 ENCOUNTER — Encounter (HOSPITAL_COMMUNITY): Payer: Self-pay

## 2022-05-19 ENCOUNTER — Ambulatory Visit (HOSPITAL_COMMUNITY)
Admission: RE | Admit: 2022-05-19 | Discharge: 2022-05-19 | Disposition: A | Discharge status: Home / Self Care | Payer: Medicare Other | Source: Ambulatory Visit | Attending: Gastroenterology | Admitting: Gastroenterology

## 2022-05-19 ENCOUNTER — Other Ambulatory Visit (HOSPITAL_COMMUNITY)
Admission: RE | Admit: 2022-05-19 | Discharge: 2022-05-19 | Disposition: A | Discharge status: Home / Self Care | Payer: Medicare Other | Source: Ambulatory Visit | Attending: Hematology | Admitting: Hematology

## 2022-05-19 DIAGNOSIS — D696 Thrombocytopenia, unspecified: Secondary | ICD-10-CM | POA: Diagnosis not present

## 2022-05-19 DIAGNOSIS — I5032 Chronic diastolic (congestive) heart failure: Secondary | ICD-10-CM | POA: Diagnosis not present

## 2022-05-19 DIAGNOSIS — R188 Other ascites: Secondary | ICD-10-CM

## 2022-05-19 DIAGNOSIS — K7460 Unspecified cirrhosis of liver: Secondary | ICD-10-CM | POA: Diagnosis not present

## 2022-05-19 DIAGNOSIS — E1122 Type 2 diabetes mellitus with diabetic chronic kidney disease: Secondary | ICD-10-CM | POA: Diagnosis not present

## 2022-05-19 DIAGNOSIS — D649 Anemia, unspecified: Secondary | ICD-10-CM | POA: Diagnosis not present

## 2022-05-19 DIAGNOSIS — K7581 Nonalcoholic steatohepatitis (NASH): Secondary | ICD-10-CM | POA: Diagnosis not present

## 2022-05-19 LAB — BODY FLUID CELL COUNT WITH DIFFERENTIAL
Eos, Fluid: 0 %
Lymphs, Fluid: 33 %
Monocyte-Macrophage-Serous Fluid: 47 % — ABNORMAL LOW (ref 50–90)
Neutrophil Count, Fluid: 20 % (ref 0–25)
Total Nucleated Cell Count, Fluid: 187 cu mm (ref 0–1000)

## 2022-05-19 LAB — KAPPA/LAMBDA LIGHT CHAINS
Kappa free light chain: 101.3 mg/L — ABNORMAL HIGH (ref 3.3–19.4)
Kappa, lambda light chain ratio: 1.2 (ref 0.26–1.65)
Lambda free light chains: 84.5 mg/L — ABNORMAL HIGH (ref 5.7–26.3)

## 2022-05-19 LAB — OCCULT BLOOD X 1 CARD TO LAB, STOOL
Fecal Occult Bld: NEGATIVE
Fecal Occult Bld: NEGATIVE
Fecal Occult Bld: NEGATIVE

## 2022-05-19 LAB — GRAM STAIN: Gram Stain: NONE SEEN

## 2022-05-19 LAB — PROTEIN / CREATININE RATIO, URINE
Creatinine, Urine: 340 mg/dL
Protein Creatinine Ratio: 0.09 mg/mg{Cre} (ref 0.00–0.15)
Total Protein, Urine: 32 mg/dL

## 2022-05-19 LAB — ANTINUCLEAR ANTIBODIES, IFA: ANA Ab, IFA: NEGATIVE

## 2022-05-19 MED ORDER — ALBUMIN HUMAN 25 % IV SOLN: 25.0000 g | Freq: Once | INTRAVENOUS | Status: AC

## 2022-05-19 MED ORDER — ALBUMIN HUMAN 25 % IV SOLN
INTRAVENOUS | Status: AC
  Administered 2022-05-19: 25 g via INTRAVENOUS
  Filled 2022-05-19: qty 100

## 2022-05-19 NOTE — Telephone Encounter (Signed)
Mandy: Looks like patient cancelled his follow-up on 4/1. Not sure why. Can you reach out to him to see if he would like to reschedule in early April?

## 2022-05-19 NOTE — Progress Notes (Signed)
Patient tolerated right sided paracentesis procedure well today and 25G of IV albumin and 4 Liters of clear yellow ascites removed with labs collected and taken to lab for processing. Patient verbalized understanding of discharge instructions and ambulatory at departure with no acute distress noted.

## 2022-05-19 NOTE — Procedures (Signed)
   NASH Cirrhosis; recurrent ascites TIPS 02/14/22 Revision on 05/09/22 in IR  US guided RLQ paracentesis 4 liters max Dark yellow fluid Labs sent per MD  Tolerated well  EBL: less than 1 cc

## 2022-05-20 ENCOUNTER — Inpatient Hospital Stay (HOSPITAL_COMMUNITY): Admission: RE | Admit: 2022-05-20 | Payer: BLUE CROSS/BLUE SHIELD | Source: Ambulatory Visit

## 2022-05-20 LAB — PROTEIN ELECTROPHORESIS, SERUM
A/G Ratio: 0.8 (ref 0.7–1.7)
Albumin ELP: 2.2 g/dL — ABNORMAL LOW (ref 2.9–4.4)
Alpha-1-Globulin: 0.2 g/dL (ref 0.0–0.4)
Alpha-2-Globulin: 0.2 g/dL — ABNORMAL LOW (ref 0.4–1.0)
Beta Globulin: 1.1 g/dL (ref 0.7–1.3)
Gamma Globulin: 1.1 g/dL (ref 0.4–1.8)
Globulin, Total: 2.6 g/dL (ref 2.2–3.9)
Total Protein ELP: 4.8 g/dL — ABNORMAL LOW (ref 6.0–8.5)

## 2022-05-20 LAB — RHEUMATOID FACTOR: Rheumatoid fact SerPl-aCnc: <10 IU/mL (ref <14.0)

## 2022-05-20 LAB — PATHOLOGIST SMEAR REVIEW

## 2022-05-20 NOTE — Telephone Encounter (Signed)
OK, can we reach out about scheduling his follow-up?

## 2022-05-21 LAB — COPPER, SERUM: Copper: 40 ug/dL — ABNORMAL LOW (ref 69–132)

## 2022-05-23 ENCOUNTER — Telehealth: Payer: Self-pay | Admitting: Internal Medicine

## 2022-05-23 ENCOUNTER — Encounter (HOSPITAL_COMMUNITY): Payer: Self-pay

## 2022-05-23 ENCOUNTER — Ambulatory Visit (HOSPITAL_COMMUNITY)
Admission: RE | Admit: 2022-05-23 | Discharge: 2022-05-23 | Disposition: A | Discharge status: Home / Self Care | Payer: Medicare Other | Source: Ambulatory Visit | Attending: Gastroenterology | Admitting: Gastroenterology

## 2022-05-23 ENCOUNTER — Other Ambulatory Visit: Payer: Self-pay | Admitting: Gastroenterology

## 2022-05-23 DIAGNOSIS — I130 Hypertensive heart and chronic kidney disease with heart failure and stage 1 through stage 4 chronic kidney disease, or unspecified chronic kidney disease: Secondary | ICD-10-CM | POA: Diagnosis not present

## 2022-05-23 DIAGNOSIS — K652 Spontaneous bacterial peritonitis: Secondary | ICD-10-CM | POA: Diagnosis not present

## 2022-05-23 DIAGNOSIS — N189 Chronic kidney disease, unspecified: Secondary | ICD-10-CM | POA: Diagnosis not present

## 2022-05-23 DIAGNOSIS — R7881 Bacteremia: Secondary | ICD-10-CM | POA: Diagnosis not present

## 2022-05-23 DIAGNOSIS — K7682 Hepatic encephalopathy: Secondary | ICD-10-CM

## 2022-05-23 DIAGNOSIS — I129 Hypertensive chronic kidney disease with stage 1 through stage 4 chronic kidney disease, or unspecified chronic kidney disease: Secondary | ICD-10-CM | POA: Diagnosis not present

## 2022-05-23 DIAGNOSIS — I5032 Chronic diastolic (congestive) heart failure: Secondary | ICD-10-CM | POA: Diagnosis not present

## 2022-05-23 DIAGNOSIS — K7469 Other cirrhosis of liver: Secondary | ICD-10-CM | POA: Diagnosis not present

## 2022-05-23 DIAGNOSIS — R601 Generalized edema: Secondary | ICD-10-CM | POA: Diagnosis not present

## 2022-05-23 DIAGNOSIS — D731 Hypersplenism: Secondary | ICD-10-CM | POA: Diagnosis not present

## 2022-05-23 DIAGNOSIS — D631 Anemia in chronic kidney disease: Secondary | ICD-10-CM | POA: Diagnosis not present

## 2022-05-23 DIAGNOSIS — K7460 Unspecified cirrhosis of liver: Secondary | ICD-10-CM | POA: Insufficient documentation

## 2022-05-23 DIAGNOSIS — R188 Other ascites: Secondary | ICD-10-CM | POA: Insufficient documentation

## 2022-05-23 DIAGNOSIS — E1122 Type 2 diabetes mellitus with diabetic chronic kidney disease: Secondary | ICD-10-CM | POA: Diagnosis not present

## 2022-05-23 DIAGNOSIS — D6959 Other secondary thrombocytopenia: Secondary | ICD-10-CM | POA: Diagnosis not present

## 2022-05-23 DIAGNOSIS — E876 Hypokalemia: Secondary | ICD-10-CM | POA: Diagnosis not present

## 2022-05-23 DIAGNOSIS — K766 Portal hypertension: Secondary | ICD-10-CM | POA: Diagnosis not present

## 2022-05-23 LAB — BODY FLUID CELL COUNT WITH DIFFERENTIAL
Eos, Fluid: 0 %
Lymphs, Fluid: 46 %
Monocyte-Macrophage-Serous Fluid: 35 % — ABNORMAL LOW (ref 50–90)
Neutrophil Count, Fluid: 19 % (ref 0–25)
Total Nucleated Cell Count, Fluid: 151 cu mm (ref 0–1000)

## 2022-05-23 LAB — GRAM STAIN

## 2022-05-23 MED ORDER — LACTULOSE 10 GM/15ML PO SOLN: 20.0000 g | Freq: Three times a day (TID) | ORAL | 5 refills | Status: DC

## 2022-05-23 MED ORDER — ALBUMIN HUMAN 25 % IV SOLN
INTRAVENOUS | Status: AC
  Administered 2022-05-23: 25 g via INTRAVENOUS
  Filled 2022-05-23: qty 100

## 2022-05-23 MED ORDER — ALBUMIN HUMAN 25 % IV SOLN: 25.0000 g | Freq: Once | INTRAVENOUS | Status: AC

## 2022-05-23 NOTE — Procedures (Signed)
PreOperative Dx: Cirrhosis, ascites °Postoperative Dx: Cirrhosis, ascites °Procedure:   US guided paracentesis °Radiologist:  Micky Overturf °Anesthesia:  10 ml of1% lidocaine °Specimen:  4 L of yellow ascitic fluid °EBL:   < 1 ml °Complications: None   °

## 2022-05-23 NOTE — Progress Notes (Signed)
Patient tolerated right sided paracentesis procedure and 25G of IV albumin well today and 4 Liters of ascites removed with labs collected and sent to lab for processing. Patient verbalized understanding of discharge instructions and ambulatory at departure with no acute distress noted.

## 2022-05-23 NOTE — Telephone Encounter (Signed)
Rx sent to [facility] on [address]

## 2022-05-23 NOTE — Telephone Encounter (Signed)
Spoke to pt, he informed me that he is almost out of Lactulose. Please, send to pharmacy

## 2022-05-23 NOTE — Telephone Encounter (Signed)
Shawn [name] brought in this note from patient ....  For a prescription for Lactulose 20 .... They didn't fill at drug store said take and double the 10 but are going to run out (need 20)

## 2022-05-24 ENCOUNTER — Inpatient Hospital Stay (HOSPITAL_COMMUNITY)
Admission: EM | Admit: 2022-05-24 | Discharge: 2022-05-26 | DRG: 433 | Disposition: A | Discharge status: Home / Self Care | Payer: Medicare Other | Attending: Internal Medicine | Admitting: Internal Medicine

## 2022-05-24 ENCOUNTER — Encounter (HOSPITAL_COMMUNITY): Payer: Self-pay | Admitting: Family Medicine

## 2022-05-24 ENCOUNTER — Other Ambulatory Visit: Payer: Self-pay

## 2022-05-24 DIAGNOSIS — I130 Hypertensive heart and chronic kidney disease with heart failure and stage 1 through stage 4 chronic kidney disease, or unspecified chronic kidney disease: Secondary | ICD-10-CM | POA: Diagnosis present

## 2022-05-24 DIAGNOSIS — Z6834 Body mass index (BMI) 34.0-34.9, adult: Secondary | ICD-10-CM | POA: Diagnosis not present

## 2022-05-24 DIAGNOSIS — K7581 Nonalcoholic steatohepatitis (NASH): Secondary | ICD-10-CM | POA: Diagnosis present

## 2022-05-24 DIAGNOSIS — K7460 Unspecified cirrhosis of liver: Secondary | ICD-10-CM

## 2022-05-24 DIAGNOSIS — Z7982 Long term (current) use of aspirin: Secondary | ICD-10-CM

## 2022-05-24 DIAGNOSIS — Z79899 Other long term (current) drug therapy: Secondary | ICD-10-CM | POA: Diagnosis not present

## 2022-05-24 DIAGNOSIS — Z8673 Personal history of transient ischemic attack (TIA), and cerebral infarction without residual deficits: Secondary | ICD-10-CM

## 2022-05-24 DIAGNOSIS — K7469 Other cirrhosis of liver: Secondary | ICD-10-CM | POA: Diagnosis present

## 2022-05-24 DIAGNOSIS — Z720 Tobacco use: Secondary | ICD-10-CM

## 2022-05-24 DIAGNOSIS — R188 Other ascites: Secondary | ICD-10-CM

## 2022-05-24 DIAGNOSIS — D696 Thrombocytopenia, unspecified: Secondary | ICD-10-CM

## 2022-05-24 DIAGNOSIS — I5032 Chronic diastolic (congestive) heart failure: Secondary | ICD-10-CM | POA: Diagnosis present

## 2022-05-24 DIAGNOSIS — R7989 Other specified abnormal findings of blood chemistry: Secondary | ICD-10-CM

## 2022-05-24 DIAGNOSIS — K219 Gastro-esophageal reflux disease without esophagitis: Secondary | ICD-10-CM | POA: Diagnosis present

## 2022-05-24 DIAGNOSIS — Z8249 Family history of ischemic heart disease and other diseases of the circulatory system: Secondary | ICD-10-CM

## 2022-05-24 DIAGNOSIS — R895 Abnormal microbiological findings in specimens from other organs, systems and tissues: Secondary | ICD-10-CM | POA: Diagnosis not present

## 2022-05-24 DIAGNOSIS — E1169 Type 2 diabetes mellitus with other specified complication: Secondary | ICD-10-CM | POA: Diagnosis present

## 2022-05-24 DIAGNOSIS — G4733 Obstructive sleep apnea (adult) (pediatric): Secondary | ICD-10-CM | POA: Diagnosis present

## 2022-05-24 DIAGNOSIS — K652 Spontaneous bacterial peritonitis: Secondary | ICD-10-CM | POA: Diagnosis not present

## 2022-05-24 DIAGNOSIS — F32A Depression, unspecified: Secondary | ICD-10-CM | POA: Diagnosis present

## 2022-05-24 DIAGNOSIS — Z981 Arthrodesis status: Secondary | ICD-10-CM

## 2022-05-24 DIAGNOSIS — N1832 Chronic kidney disease, stage 3b: Secondary | ICD-10-CM | POA: Diagnosis present

## 2022-05-24 DIAGNOSIS — I878 Other specified disorders of veins: Secondary | ICD-10-CM | POA: Diagnosis present

## 2022-05-24 DIAGNOSIS — F1091 Alcohol use, unspecified, in remission: Secondary | ICD-10-CM | POA: Diagnosis present

## 2022-05-24 DIAGNOSIS — I10 Essential (primary) hypertension: Secondary | ICD-10-CM | POA: Diagnosis present

## 2022-05-24 DIAGNOSIS — Z95828 Presence of other vascular implants and grafts: Secondary | ICD-10-CM

## 2022-05-24 DIAGNOSIS — R7881 Bacteremia: Secondary | ICD-10-CM | POA: Diagnosis not present

## 2022-05-24 DIAGNOSIS — E1140 Type 2 diabetes mellitus with diabetic neuropathy, unspecified: Secondary | ICD-10-CM | POA: Diagnosis present

## 2022-05-24 DIAGNOSIS — D649 Anemia, unspecified: Secondary | ICD-10-CM | POA: Diagnosis present

## 2022-05-24 DIAGNOSIS — K760 Fatty (change of) liver, not elsewhere classified: Secondary | ICD-10-CM | POA: Diagnosis present

## 2022-05-24 DIAGNOSIS — D631 Anemia in chronic kidney disease: Secondary | ICD-10-CM | POA: Diagnosis present

## 2022-05-24 DIAGNOSIS — Z91138 Patient's unintentional underdosing of medication regimen for other reason: Secondary | ICD-10-CM

## 2022-05-24 DIAGNOSIS — K766 Portal hypertension: Secondary | ICD-10-CM | POA: Diagnosis present

## 2022-05-24 DIAGNOSIS — D6959 Other secondary thrombocytopenia: Secondary | ICD-10-CM | POA: Diagnosis present

## 2022-05-24 DIAGNOSIS — Z7984 Long term (current) use of oral hypoglycemic drugs: Secondary | ICD-10-CM

## 2022-05-24 DIAGNOSIS — T500X6A Underdosing of mineralocorticoids and their antagonists, initial encounter: Secondary | ICD-10-CM | POA: Diagnosis present

## 2022-05-24 DIAGNOSIS — E876 Hypokalemia: Secondary | ICD-10-CM | POA: Diagnosis not present

## 2022-05-24 DIAGNOSIS — F411 Generalized anxiety disorder: Secondary | ICD-10-CM

## 2022-05-24 DIAGNOSIS — E1122 Type 2 diabetes mellitus with diabetic chronic kidney disease: Secondary | ICD-10-CM | POA: Diagnosis present

## 2022-05-24 DIAGNOSIS — F09 Unspecified mental disorder due to known physiological condition: Secondary | ICD-10-CM

## 2022-05-24 DIAGNOSIS — Z800 Family history of malignant neoplasm of digestive organs: Secondary | ICD-10-CM

## 2022-05-24 DIAGNOSIS — E785 Hyperlipidemia, unspecified: Secondary | ICD-10-CM | POA: Diagnosis present

## 2022-05-24 DIAGNOSIS — Z803 Family history of malignant neoplasm of breast: Secondary | ICD-10-CM

## 2022-05-24 LAB — COMPREHENSIVE METABOLIC PANEL
ALT: 31 U/L (ref 0–44)
AST: 57 U/L — ABNORMAL HIGH (ref 15–41)
Albumin: 2.3 g/dL — ABNORMAL LOW (ref 3.5–5.0)
Alkaline Phosphatase: 144 U/L — ABNORMAL HIGH (ref 38–126)
Anion gap: 6 (ref 5–15)
BUN: 19 mg/dL (ref 8–23)
CO2: 25 mmol/L (ref 22–32)
Calcium: 8.4 mg/dL — ABNORMAL LOW (ref 8.9–10.3)
Chloride: 108 mmol/L (ref 98–111)
Creatinine, Ser: 1.38 mg/dL — ABNORMAL HIGH (ref 0.61–1.24)
GFR, Estimated: 57 mL/min — ABNORMAL LOW (ref >60)
Glucose, Bld: 114 mg/dL — ABNORMAL HIGH (ref 70–99)
Potassium: 3.6 mmol/L (ref 3.5–5.1)
Sodium: 139 mmol/L (ref 135–145)
Total Bilirubin: 1.7 mg/dL — ABNORMAL HIGH (ref 0.3–1.2)
Total Protein: 5.3 g/dL — ABNORMAL LOW (ref 6.5–8.1)

## 2022-05-24 LAB — CBC WITH DIFFERENTIAL/PLATELET
Abs Immature Granulocytes: 0.02 10*3/uL (ref 0.00–0.07)
Basophils Absolute: 0.1 10*3/uL (ref 0.0–0.1)
Basophils Relative: 1 %
Eosinophils Absolute: 0.6 10*3/uL — ABNORMAL HIGH (ref 0.0–0.5)
Eosinophils Relative: 11 %
HCT: 28.8 % — ABNORMAL LOW (ref 39.0–52.0)
Hemoglobin: 10 g/dL — ABNORMAL LOW (ref 13.0–17.0)
Immature Granulocytes: 0 %
Lymphocytes Relative: 13 %
Lymphs Abs: 0.8 10*3/uL (ref 0.7–4.0)
MCH: 33.3 pg (ref 26.0–34.0)
MCHC: 34.7 g/dL (ref 30.0–36.0)
MCV: 96 fL (ref 80.0–100.0)
Monocytes Absolute: 0.6 10*3/uL (ref 0.1–1.0)
Monocytes Relative: 11 %
Neutro Abs: 3.7 10*3/uL (ref 1.7–7.7)
Neutrophils Relative %: 64 %
Platelets: 121 10*3/uL — ABNORMAL LOW (ref 150–400)
RBC: 3 MIL/uL — ABNORMAL LOW (ref 4.22–5.81)
RDW: 15.8 % — ABNORMAL HIGH (ref 11.5–15.5)
WBC: 5.8 10*3/uL (ref 4.0–10.5)
nRBC: 0 % (ref 0.0–0.2)

## 2022-05-24 LAB — PROTIME-INR
INR: 1.5 — ABNORMAL HIGH (ref 0.8–1.2)
Prothrombin Time: 18.3 seconds — ABNORMAL HIGH (ref 11.4–15.2)

## 2022-05-24 LAB — GLUCOSE, CAPILLARY
Glucose-Capillary: 165 mg/dL — ABNORMAL HIGH (ref 70–99)
Glucose-Capillary: 80 mg/dL (ref 70–99)

## 2022-05-24 LAB — CULTURE, BODY FLUID W GRAM STAIN -BOTTLE: Culture: NO GROWTH

## 2022-05-24 LAB — HIV ANTIBODY (ROUTINE TESTING W REFLEX): HIV Screen 4th Generation wRfx: NONREACTIVE

## 2022-05-24 LAB — CYTOLOGY - NON PAP

## 2022-05-24 LAB — METHYLMALONIC ACID, SERUM: Methylmalonic Acid, Quantitative: 364 nmol/L (ref 0–378)

## 2022-05-24 MED ORDER — INSULIN ASPART 100 UNIT/ML IJ SOLN
0.0000 [IU] | Freq: Three times a day (TID) | INTRAMUSCULAR | Status: DC
  Administered 2022-05-24: 1 [IU] via SUBCUTANEOUS

## 2022-05-24 MED ORDER — ACETAMINOPHEN 650 MG RE SUPP: 650.0000 mg | Freq: Four times a day (QID) | RECTAL | Status: DC | PRN

## 2022-05-24 MED ORDER — SPIRONOLACTONE 25 MG PO TABS
25.0000 mg | ORAL_TABLET | Freq: Every day | ORAL | Status: DC
  Administered 2022-05-25 – 2022-05-26 (×2): 25 mg via ORAL
  Filled 2022-05-24 (×2): qty 1

## 2022-05-24 MED ORDER — FLUTICASONE PROPIONATE 50 MCG/ACT NA SUSP: 2.0000 | Freq: Every day | NASAL | Status: DC

## 2022-05-24 MED ORDER — TRAZODONE HCL 50 MG PO TABS: 25.0000 mg | ORAL_TABLET | Freq: Every evening | ORAL | Status: DC | PRN

## 2022-05-24 MED ORDER — ACETAMINOPHEN 325 MG PO TABS: 650.0000 mg | ORAL_TABLET | Freq: Four times a day (QID) | ORAL | Status: DC | PRN

## 2022-05-24 MED ORDER — ALBUMIN HUMAN 25 % IV SOLN
50.0000 g | Freq: Three times a day (TID) | INTRAVENOUS | Status: AC
  Administered 2022-05-24 – 2022-05-25 (×3): 50 g via INTRAVENOUS
  Filled 2022-05-24 (×6): qty 200

## 2022-05-24 MED ORDER — ONDANSETRON HCL 4 MG PO TABS: 4.0000 mg | ORAL_TABLET | Freq: Four times a day (QID) | ORAL | Status: DC | PRN

## 2022-05-24 MED ORDER — SODIUM CHLORIDE 0.9 % IV SOLN
2.0000 g | Freq: Once | INTRAVENOUS | Status: DC
  Administered 2022-05-24: 2 g via INTRAVENOUS
  Filled 2022-05-24: qty 20

## 2022-05-24 MED ORDER — SODIUM CHLORIDE 0.9 % IV SOLN
2.0000 g | INTRAVENOUS | Status: DC
  Administered 2022-05-25 – 2022-05-26 (×2): 2 g via INTRAVENOUS
  Filled 2022-05-24 (×2): qty 20

## 2022-05-24 MED ORDER — VENLAFAXINE HCL ER 75 MG PO CP24
150.0000 mg | ORAL_CAPSULE | Freq: Two times a day (BID) | ORAL | Status: DC
  Administered 2022-05-24 – 2022-05-26 (×4): 150 mg via ORAL
  Filled 2022-05-24 (×4): qty 2

## 2022-05-24 MED ORDER — LACTULOSE 10 GM/15ML PO SOLN
20.0000 g | Freq: Three times a day (TID) | ORAL | Status: DC
  Administered 2022-05-24 – 2022-05-26 (×6): 20 g via ORAL
  Filled 2022-05-24 (×6): qty 30

## 2022-05-24 MED ORDER — HEPARIN SODIUM (PORCINE) 5000 UNIT/ML IJ SOLN
5000.0000 [IU] | Freq: Three times a day (TID) | INTRAMUSCULAR | Status: DC
  Administered 2022-05-24: 5000 [IU] via SUBCUTANEOUS
  Filled 2022-05-24: qty 1

## 2022-05-24 MED ORDER — SODIUM BICARBONATE 650 MG PO TABS
650.0000 mg | ORAL_TABLET | Freq: Three times a day (TID) | ORAL | Status: DC
  Administered 2022-05-24 – 2022-05-26 (×6): 650 mg via ORAL
  Filled 2022-05-24 (×6): qty 1

## 2022-05-24 MED ORDER — PANTOPRAZOLE SODIUM 40 MG PO TBEC
40.0000 mg | DELAYED_RELEASE_TABLET | Freq: Every day | ORAL | Status: DC
  Administered 2022-05-25 – 2022-05-26 (×2): 40 mg via ORAL
  Filled 2022-05-24 (×2): qty 1

## 2022-05-24 MED ORDER — NICOTINE 14 MG/24HR TD PT24: 14.0000 mg | MEDICATED_PATCH | Freq: Every day | TRANSDERMAL | Status: DC | PRN

## 2022-05-24 MED ORDER — ONDANSETRON HCL 4 MG/2ML IJ SOLN: 4.0000 mg | Freq: Four times a day (QID) | INTRAMUSCULAR | Status: DC | PRN

## 2022-05-24 MED ORDER — ADULT MULTIVITAMIN W/MINERALS CH
1.0000 | ORAL_TABLET | Freq: Every day | ORAL | Status: DC
  Administered 2022-05-24 – 2022-05-26 (×3): 1 via ORAL
  Filled 2022-05-24 (×3): qty 1

## 2022-05-24 MED ORDER — DORZOLAMIDE HCL 2 % OP SOLN
1.0000 [drp] | Freq: Two times a day (BID) | OPHTHALMIC | Status: DC
  Administered 2022-05-24 – 2022-05-26 (×5): 1 [drp] via OPHTHALMIC
  Filled 2022-05-24: qty 10

## 2022-05-24 MED ORDER — TORSEMIDE 20 MG PO TABS
10.0000 mg | ORAL_TABLET | ORAL | Status: DC
  Administered 2022-05-26: 10 mg via ORAL
  Filled 2022-05-24: qty 1

## 2022-05-24 MED ORDER — ASPIRIN 81 MG PO TBEC
81.0000 mg | DELAYED_RELEASE_TABLET | Freq: Every day | ORAL | Status: DC
  Administered 2022-05-25 – 2022-05-26 (×2): 81 mg via ORAL
  Filled 2022-05-24 (×2): qty 1

## 2022-05-24 NOTE — Progress Notes (Signed)
Went in to ask patient about using CPAP for tonight.  Patient was sitting up in chair, and patient stated that he does not always use his at home.  He stated that he did not want to use one here.  Locked his chair for him and left room.

## 2022-05-24 NOTE — Progress Notes (Signed)
Patient alert and verbal, ambulates independent. Tolerated medications whole, per patient request would like medications whole in applesauce. Patient verbalized no complaints of pain/discomfort.

## 2022-05-24 NOTE — ED Triage Notes (Signed)
Pt states he had fluid drawn from his abdomen yesterday & today he got a call telling him to come to the ER for possible fluids.  States they were told there is an infection in the fluid.

## 2022-05-24 NOTE — H&P (Addendum)
History and Physical    Shawn [name]. [id number] DOB: 04/08/1955 DOA: 05/24/2022  PCP: [name], MD  Patient coming from: Home  Chief Complaint: GI office found G+ cocci in ascitic fluid after paracentesis  HPI: Shawn [name]. is a 67 y.o. male with medical history significant for cirrhosis, ascites requiring weekly paracentesis, T2DM, HTN, OSA, and depression presenting to ED on 3/19 for admission per outpatient gastroenterologist office.  On 3/18, patient received R-sided paracentesis with 4 L ascites removed and ascitic fluid analysis showed G+ cocci.  Patient was called and instructed to present to ED urgently for IV antibiotics for possible SBP.  Patient has been feeling well without any significant abdominal pain or discomfort except immediately prior to paracentesis procedures.  He has been afebrile at home and does not have any systemic infection symptoms such as arthralgias.  Feels well overall.  ED Course: Patient presented to ED and was started on 2 g CTX daily following the drawing of blood cultures.  Labs significant for Cr 1.38, AST 57/ALT 31, Tbili 1.7, and anemia of 10.0 Hgb with RDW 15.8 as well as thrombocytopenia of 121.  PT elevated to 18.3 and INR 1.5.  Hospitalist service consulted for admission given prolonged IV treatment course for SBP.  Review of Systems: Reviewed as noted above, otherwise negative.  Past Medical History:  Diagnosis Date   ADD (attention deficit disorder)    ADHD    Anxiety 1987   mild (crowds, noises)   Anxiety    Attention deficit disorder    Cancer (HCC)    Cirrhosis of liver (HCC)    CVA (cerebral vascular accident) ([place])    Depression    Diabetic neuropathy ([place])    Essential hypertension, benign    Long-standing history, negative secondary workup   Headache    History of cardiac catheterization    Widely patent coronary and renal arteries November 2013   Hyperlipidemia    Hypertension    whitecoat   Obstructive  sleep apnea    cpap   Prediabetes    Sleep apnea    TIA (transient ischemic attack)    Possible, 2007   Type 2 diabetes mellitus (HCC)    Venous stasis     Past Surgical History:  Procedure Laterality Date   ANTERIOR CERVICAL DECOMP/DISCECTOMY FUSION N/A 12/30/2015   Procedure: ANTERIOR CERVICAL DECOMPRESSION/DISCECTOMY FUSION C6 - C7 1 LEVEL;  Surgeon: Melina Schools, MD;  Location: [facility];  Service: Orthopedics;  Laterality: N/A;   CARDIAC CATHETERIZATION  01/2012   negative   CARPAL TUNNEL RELEASE     CERVICAL SPINE NERVE BLOCK  05/17/2017   IR INTRAVASCULAR ULTRASOUND NON CORONARY  02/14/2022   IR PARACENTESIS  02/14/2022   IR PARACENTESIS  05/09/2022   IR RADIOLOGIST EVAL & MGMT  12/03/2021   IR RADIOLOGIST EVAL & MGMT  01/13/2022   IR RADIOLOGIST EVAL & MGMT  03/24/2022   IR RADIOLOGIST EVAL & MGMT  04/20/2022   IR TIPS  02/14/2022   IR TIPS REVISION MOD SED  05/09/2022   IR US GUIDE VASC ACCESS RIGHT  02/14/2022   IR US GUIDE VASC ACCESS RIGHT  02/14/2022   IR US GUIDE VASC ACCESS RIGHT  05/09/2022   KNEE SURGERY Right    NECK SURGERY     RADIOLOGY WITH ANESTHESIA N/A 02/14/2022   Procedure: TIPS;  Surgeon: Suzette Battiest, MD;  Location: [facility];  Service: Radiology;  Laterality: N/A;   VASECTOMY  reports that he has never smoked. His smokeless tobacco use includes snuff. He reports that he does not drink alcohol and does not use drugs.  No Known Allergies  Family History  Problem Relation Age of Onset   Colon cancer Mother    Hypertension Mother    CAD Mother    Breast cancer Mother    Cirrhosis Brother     Prior to Admission medications   Medication Sig Start Date End Date Taking? Authorizing Provider  ascorbic Acid (VITAMIN C) 500 MG CPCR Take 500 mg by mouth daily.   Yes [provider]  aspirin EC 81 MG tablet Take 81 mg by mouth daily. Swallow whole.   Yes [provider]  Cholecalciferol (D3 VITAMIN PO) Take by mouth daily. 5000 IU daily    Yes [provider]  dorzolamide (TRUSOPT) 2 % ophthalmic solution 1 drop 2 (two) times daily. 04/04/22  Yes [provider]  fluticasone (FLONASE) 50 MCG/ACT nasal spray Place 2 sprays into both nostrils daily. 11/29/21  Yes Luking, Elayne Snare, MD  lactulose (CHRONULAC) 10 GM/15ML solution Take 30 mLs (20 g total) by mouth 3 (three) times daily. 05/23/22 05/23/23 Yes Erenest Rasher, PA-C  metFORMIN (GLUCOPHAGE) 500 MG tablet 1 qd Patient taking differently: Take 500 mg by mouth daily with breakfast. 11/01/21  Yes Luking, Elayne Snare, MD  Milk Thistle 1000 MG CAPS Take by mouth daily.   Yes [provider]  Omega-3 Fatty Acids (FISH OIL) 1000 MG CAPS Take by mouth daily.   Yes [provider]  pantoprazole (PROTONIX) 40 MG tablet Take 1 tablet (40 mg total) by mouth daily. 11/01/21  Yes [name], MD  sodium bicarbonate 650 MG tablet Take 650 mg by mouth 3 (three) times daily. Morning, evening, bedtime   Yes [provider]  spironolactone (ALDACTONE) 25 MG tablet Take 25 mg by mouth daily. 05/23/22 05/23/23 Yes [provider]  torsemide (DEMADEX) 10 MG tablet Take 10 mg by mouth every other day. For two weeks 05/23/22 05/23/23 Yes [provider]  venlafaxine XR (EFFEXOR-XR) 150 MG 24 hr capsule Take 1 capsule (150 mg total) by mouth 2 (two) times daily. 11/01/21  Yes [name], MD  vitamin E 180 MG (400 UNITS) capsule Take 400 Units by mouth daily.   Yes [provider]  zinc gluconate 50 MG tablet Take 50 mg by mouth daily.   Yes [provider]  Blood Glucose Monitoring Suppl DEVI 1 each by Does not apply route in the morning, at noon, and at bedtime. May substitute to any manufacturer covered by patient's insurance Dx E11.22 05/12/22   [name], MD  Glucose Blood (BLOOD GLUCOSE TEST STRIPS) STRP Use to check blood sugar once daily before meals. May substitute to any manufacturer covered by patient's insurance  E11.22 05/12/22   [name], MD  Lancet Device MISC Use to check blood sugar once daily before meals. May substitute to any manufacturer covered by patient's insurance E11.22 05/12/22   [name], MD  Lancets Misc. MISC Use to check blood sugar once daily before meals. May substitute to any manufacturer covered by patient's insurance 05/12/22   Luking, Elayne Snare, MD  rifaximin (XIFAXAN) 550 MG TABS tablet Take 1 tablet (550 mg total) by mouth 2 (two) times daily. Patient not taking: Reported on 05/18/2022 04/13/22   Daneil Dolin, MD    Physical Exam: Vitals:   05/24/22 1016 05/24/22 1230 05/24/22 1337  05/24/22 1359  BP:  118/81 119/81 136/78  Pulse:  86 86 80  Resp:   18   Temp:   98.1 F (36.7 C) 98.2 F (36.8 C)  TempSrc:   Oral Oral  SpO2:  100% 100% 100%  Weight: 94.3 kg     Height: 5\' 5"  (1.651 m)       Examination: General: Elderly overweight man, tired appearing resting in bed in no acute distress. HEENT: Mild scleral icterus.  Dry mucous membranes.  NCAT. Lungs: CTAB.  Normal work of breathing on room air. Cardiovascular: RRR.  Normal S1/S2.  No murmurs/rubs/gallops.  No JVD. Abdomen: Distended abdomen with shifting dullness.  Nontender.  No rebound or guarding. Extremities: 1+ peripheral edema bilaterally.  Capillary refill less than 2 seconds.  No cyanosis or clubbing. Skin: Mildly jaundiced, no rashes or lesions grossly. Neuro: Alert and oriented x4.  Asterixis.  Labs on Admission: I have personally reviewed following labs and imaging studies:  CBC: Recent Labs  Lab 05/18/22 1409 05/24/22 1127  WBC 7.2 5.8  NEUTROABS  --  3.7  HGB 10.2* 10.0*  HCT 29.8* 28.8*  MCV 94.9 96.0  PLT 133* 123XX123*   Basic Metabolic Panel: Recent Labs  Lab 05/18/22 1410 05/24/22 1127  NA 139 139  K 3.3* 3.6  CL 110 108  CO2 23 25  GLUCOSE 85 114*  BUN 19 19  CREATININE 1.30* 1.38*  CALCIUM 8.4* 8.4*  PHOS 2.8  --    GFR: Estimated Creatinine Clearance: 56.3 mL/min  (A) (by C-G formula based on SCr of 1.38 mg/dL (H)). Liver Function Tests: Recent Labs  Lab 05/18/22 1410 05/24/22 1127  AST  --  57*  ALT  --  31  ALKPHOS  --  144*  BILITOT  --  1.7*  PROT  --  5.3*  ALBUMIN 2.1* 2.3*   No results for input(s): "LIPASE", "AMYLASE" in the last 168 hours. No results for input(s): "AMMONIA" in the last 168 hours. Coagulation Profile: Recent Labs  Lab 05/24/22 1127  INR 1.5*   Cardiac Enzymes: No results for input(s): "CKTOTAL", "CKMB", "CKMBINDEX", "TROPONINI" in the last 168 hours. BNP (last 3 results) No results for input(s): "PROBNP" in the last 8760 hours. HbA1C: No results for input(s): "HGBA1C" in the last 72 hours. CBG: No results for input(s): "GLUCAP" in the last 168 hours. Lipid Profile: No results for input(s): "CHOL", "HDL", "LDLCALC", "TRIG", "CHOLHDL", "LDLDIRECT" in the last 72 hours. Thyroid Function Tests: No results for input(s): "TSH", "T4TOTAL", "FREET4", "T3FREE", "THYROIDAB" in the last 72 hours. Anemia Panel: No results for input(s): "VITAMINB12", "FOLATE", "FERRITIN", "TIBC", "IRON", "RETICCTPCT" in the last 72 hours. Urine analysis:    Component Value Date/Time   KETONESUR trace (5) (A) 04/19/2022 1453    Radiological Exams on Admission: US Paracentesis  Result Date: 05/23/2022 INDICATION: Cirrhosis, ascites EXAM: ULTRASOUND GUIDED DIAGNOSTIC AND THERAPEUTIC PARACENTESIS MEDICATIONS: None. COMPLICATIONS: None immediate. PROCEDURE: Informed written consent was obtained from the patient after a discussion of the risks, benefits and alternatives to treatment. A timeout was performed prior to the initiation of the procedure. Initial ultrasound scanning demonstrates a large amount of ascites within the right lower abdominal quadrant. The right lower abdomen was prepped and draped in the usual sterile fashion. 1% lidocaine was used for local anesthesia. Following this, a 5 [place] Yueh catheter was introduced. An  ultrasound image was saved for documentation purposes. The paracentesis was performed. The catheter was removed and a dressing was applied. The patient tolerated  the procedure well without immediate post procedural complication. Patient received post-procedure intravenous albumin; see nursing notes for details. FINDINGS: A total of approximately 4 L of yellow ascitic fluid was removed. Samples were sent to the laboratory as requested by the clinical team. IMPRESSION: Successful ultrasound-guided paracentesis yielding 4 liters of peritoneal fluid. Electronically Signed   By: Lavonia Dana M.D.   On: 05/23/2022 11:03    Assessment/Plan Principal Problem:   SBP (spontaneous bacterial peritonitis) ([place]) Active Problems:   Essential hypertension, benign   Type 2 diabetes mellitus with stage 3b chronic kidney disease, without long-term current use of insulin (HCC)   Hyperlipidemia associated with type 2 diabetes mellitus ([place])   Obstructive sleep apnea   Fatty liver   Elevated LFTs   Cirrhosis, non-alcoholic (HCC)   Cognitive dysfunction   Morbid obesity (HCC)   GERD (gastroesophageal reflux disease)   Ascites   GAD (generalized anxiety disorder)   Cirrhosis of liver with ascites (HCC)   S/P TIPS (transjugular intrahepatic portosystemic shunt)   Thrombocytopenia (HCC)   Anemia  SBP; gram positive cocci seen in ascitic fluid - CTX 1 g daily, (1 of 5)  Cirrhosis, fatty liver Portal hypertension s/p TIPS Ascites Does not currently appear encephalopathic and is likely near baseline mental status.  Last paracentesis 3/18 drew off 4 L. - Continue home lactulose 20 g TID - Monitor volume status, mental status closely - GI consult, follow up recs  Thrombocytopenia Elevated PT/INR Likely secondary to hepatic dysfunction versus splenic sequestration.  Will use caution with anticoagulation and monitor closely.  Has had extensive workup by Heme/Onc on 3/13, negative ANA, RF, hep B/C serology.  Low  platelet production. - Recheck PT/INR tomorrow morning - Follow up outpatient with Hematologist  Chronic anemia As noted above, patient seen by Heme/Onc for workup.  No signs of hemolysis or malignancy.  Folate, MMA normal.  Negative FOBT.  Likely CKD or anemia of chronic disease.  Does have low serum copper, which could be contributing. - Follow up outpatient with Hematologist  T2DM - Held metformin in setting of hospitalization  HLD - Lipid panel ordered  GERD - Continue pantoprazole 40 mg daily  Depression - Continue home venlafaxine 150 mg BID  HFpEF TTE showed G1DD on 01/2022. - Continue home torsemide 10 mg every other day - Monitor volume status, strict I/Os  OSA Reports inconsistent compliance with CPAP at home. - CPAP ordered inpatient  Hx CVA - Continue aspirin 81 mg  Tobacco use disorder Patient noted to intermittently use snuff. - Nicotine patches PRN ordered  Obesity BMI 34.81 on admission.  DVT prophylaxis: SCDs, medical prophylaxis held in setting of elevated INR (HAS-BLED score of 6) Code Status: Full Family Communication: Wife at bedside 3/19 Disposition Plan: Home when cleared by GI Consults called: Gastroenterology Admission status: Inpatient, med-surg  Severity of Illness: The appropriate patient status for this patient is INPATIENT. Inpatient status is judged to be reasonable and necessary in order to provide the required intensity of service to ensure the patient's safety. The patient's presenting symptoms, physical exam findings, and initial radiographic and laboratory data in the context of their chronic comorbidities is felt to place them at high risk for further clinical deterioration. Furthermore, it is not anticipated that the patient will be medically stable for discharge from the hospital within 2 midnights of admission.   * I certify that at the point of admission it is my clinical judgment that the patient will require inpatient  hospital care spanning  beyond 2 midnights from the point of admission due to high intensity of service, high risk for further deterioration and high frequency of surveillance required.*  Dimitry Shitarev, MS4 [facility]  If 7PM-7AM, please contact night-coverage [email or web address]  05/24/2022, 2:11 PM   ATTENDING NOTE   Patient seen and examined with D. Mining engineer, Careers information officer. In addition to supervising the encounter, I played a key role in the decision making process as well as reviewed key findings.  Pt sent to ED by GI office after discovering lab findings concerning for SBP after recent paracentesis (3/18), he has been started on IV ceftriaxone.  He has been cultured.  Otherwise he is stable; he has been resumed on all his home medications.  Please see orders and above notes and plan of care recommendations.    Gerlene Fee, MD DACD How to contact the [facility] Attending or Consulting provider [place] or covering provider during after hours [place], for this patient?  Check the care team in [facility] and look for a) attending/consulting [facility] provider listed and b) the [facility] team listed Log into [email or web address] and use [place] universal password to access. If you do not have the password, please contact the hospital operator. Locate the [facility] provider you are looking for under [facility] and page to a number that you can be directly reached. If you still have difficulty reaching the provider, please page the [facility] (Director on Call) for the Hospitalists listed on amion for assistance.

## 2022-05-24 NOTE — Progress Notes (Signed)
Referring Provider: [facility] hospitalist Primary Care Physician:  [name], MD Primary Gastroenterologist:  Dr. Gala Romney  Date of Admission: 05/24/22 Date of Consultation: 05/24/22  Reason for Consultation:  Possible SBP  HPI:  Shawn Sposito. is a 67 y.o. year old male  with a history of GERD, cirrhosis felt to be related to NASH +/- alcohol with history of intermittent heavy alcohol drinking in the past, abstinent since December 2013. He also has a history of elevated AFP, no HCC on MRI.   In 2021 he had decompensation with ascites and lower extremity edema. First paracentesis at that time with no evidence of SBP, cytology negative. Started requiring recurrent paras in August 2023. TIPS placement 02/16/22, but continued with frequent paras as he developed renal insufficiency and has been intolerant to diuretics.  Also with hepatic encephalopathy, on Lactulose. Has is established with with [facility] at [facility] in [place].   On 3/4, patient had follow-up with IR and was found to have patent TIPS and underwent TIPS balloon expansion.  Initially found to have mean portosystemic gradient of 18 mmHg s/p balloon expansion to 10 mm with decreasing gradient 8 mmHg.  He also had 9.6 L of fluid removed from his abdomen.  He underwent paracentesis 3/14 yielding 4 L of peritoneal fluid and negative for SBP.  Repeat paracentesis 3/18 yielding 4 L. PMNs 28.  Gram stain with gram-positive cocci in anaerobic bottle only.  Culture pending.  Due to positive Gram stain, recommended proceeding to the emergency room for admission for IV antibiotics and IV albumin as it was unclear if this was a contaminant or true SBP.  Labs today:  WBC normal, Hgb stable at 10.0, INR 1.5, LFTs and bilirubin elevated, but stable.  Blood culture in process.  Consult:  Reports he is feeling well overall.  He actually feels better than he has in a while.  Reports TIPS revision has helped decrease how rapid  his ascites reaccumulates.  He can actually button his pants now.  He saw his nephrologist yesterday who started him on spironolactone 25 mg daily and torsemide 10 mg on Monday, Wednesday, Friday.   Denies abdominal pain, fever, chills, nausea, vomiting.  Bowels are moving well.  Having 3+ bowel movements daily.  No BRBPR or melena.  No confusion.  Has not completed paperwork for Xifaxan patient assistance yet.  Plans to do this soon.     Past Medical History:  Diagnosis Date   ADD (attention deficit disorder)    ADHD    Anxiety 1987   mild (crowds, noises)   Anxiety    Attention deficit disorder    Cancer (HCC)    Cirrhosis of liver (HCC)    CVA (cerebral vascular accident) ([place])    Depression    Diabetic neuropathy ([place])    Essential hypertension, benign    Long-standing history, negative secondary workup   Headache    History of cardiac catheterization    Widely patent coronary and renal arteries November 2013   Hyperlipidemia    Hypertension    whitecoat   Obstructive sleep apnea    cpap   Prediabetes    Sleep apnea    TIA (transient ischemic attack)    Possible, 2007   Type 2 diabetes mellitus (HCC)    Venous stasis     Past Surgical History:  Procedure Laterality Date   ANTERIOR CERVICAL DECOMP/DISCECTOMY FUSION N/A 12/30/2015   Procedure: ANTERIOR CERVICAL DECOMPRESSION/DISCECTOMY FUSION C6 - C7  1 LEVEL;  Surgeon: Melina Schools, MD;  Location: [facility];  Service: Orthopedics;  Laterality: N/A;   CARDIAC CATHETERIZATION  01/2012   negative   CARPAL TUNNEL RELEASE     CERVICAL SPINE NERVE BLOCK  05/17/2017   IR INTRAVASCULAR ULTRASOUND NON CORONARY  02/14/2022   IR PARACENTESIS  02/14/2022   IR PARACENTESIS  05/09/2022   IR RADIOLOGIST EVAL & MGMT  12/03/2021   IR RADIOLOGIST EVAL & MGMT  01/13/2022   IR RADIOLOGIST EVAL & MGMT  03/24/2022   IR RADIOLOGIST EVAL & MGMT  04/20/2022   IR TIPS  02/14/2022   IR TIPS REVISION MOD SED  05/09/2022   IR US GUIDE VASC ACCESS  RIGHT  02/14/2022   IR US GUIDE VASC ACCESS RIGHT  02/14/2022   IR US GUIDE VASC ACCESS RIGHT  05/09/2022   KNEE SURGERY Right    NECK SURGERY     RADIOLOGY WITH ANESTHESIA N/A 02/14/2022   Procedure: TIPS;  Surgeon: Suzette Battiest, MD;  Location: [facility];  Service: Radiology;  Laterality: N/A;   VASECTOMY      Prior to Admission medications   Medication Sig Start Date End Date Taking? Authorizing Provider  ascorbic Acid (VITAMIN C) 500 MG CPCR Take 500 mg by mouth daily.   Yes [provider]  aspirin EC 81 MG tablet Take 81 mg by mouth daily. Swallow whole.   Yes [provider]  Cholecalciferol (D3 VITAMIN PO) Take by mouth daily. 5000 IU daily   Yes [provider]  dorzolamide (TRUSOPT) 2 % ophthalmic solution 1 drop 2 (two) times daily. 04/04/22  Yes [provider]  fluticasone (FLONASE) 50 MCG/ACT nasal spray Place 2 sprays into both nostrils daily. 11/29/21  Yes Luking, Elayne Snare, MD  lactulose (CHRONULAC) 10 GM/15ML solution Take 30 mLs (20 g total) by mouth 3 (three) times daily. 05/23/22 05/23/23 Yes Erenest Rasher, PA-C  metFORMIN (GLUCOPHAGE) 500 MG tablet 1 qd Patient taking differently: Take 500 mg by mouth daily with breakfast. 11/01/21  Yes Luking, Elayne Snare, MD  Milk Thistle 1000 MG CAPS Take by mouth daily.   Yes [provider]  Omega-3 Fatty Acids (FISH OIL) 1000 MG CAPS Take by mouth daily.   Yes [provider]  pantoprazole (PROTONIX) 40 MG tablet Take 1 tablet (40 mg total) by mouth daily. 11/01/21  Yes [name], MD  sodium bicarbonate 650 MG tablet Take 650 mg by mouth 3 (three) times daily. Morning, evening, bedtime   Yes [provider]  spironolactone (ALDACTONE) 25 MG tablet Take 25 mg by mouth daily. 05/23/22 05/23/23 Yes [provider]  torsemide (DEMADEX) 10 MG tablet Take 10 mg by mouth every other day. For two weeks 05/23/22 05/23/23 Yes [provider]  venlafaxine XR (EFFEXOR-XR)  150 MG 24 hr capsule Take 1 capsule (150 mg total) by mouth 2 (two) times daily. 11/01/21  Yes [name], MD  vitamin E 180 MG (400 UNITS) capsule Take 400 Units by mouth daily.   Yes [provider]  zinc gluconate 50 MG tablet Take 50 mg by mouth daily.   Yes [provider]  Blood Glucose Monitoring Suppl DEVI 1 each by Does not apply route in the morning, at noon, and at bedtime. May substitute to any manufacturer covered by patient's insurance Dx E11.22 05/12/22   [name], MD  Glucose Blood (BLOOD GLUCOSE TEST STRIPS) STRP Use to check blood sugar once daily before meals.  May substitute to any manufacturer covered by patient's insurance E11.22 05/12/22   [name], MD  Lancet Device MISC Use to check blood sugar once daily before meals. May substitute to any manufacturer covered by patient's insurance E11.22 05/12/22   [name], MD  Lancets Misc. MISC Use to check blood sugar once daily before meals. May substitute to any manufacturer covered by patient's insurance 05/12/22   Luking, Elayne Snare, MD  rifaximin (XIFAXAN) 550 MG TABS tablet Take 1 tablet (550 mg total) by mouth 2 (two) times daily. Patient not taking: Reported on 05/18/2022 04/13/22   Daneil Dolin, MD    Current Facility-Administered Medications  Medication Dose Route Frequency Provider Last Rate Last Admin   acetaminophen (TYLENOL) tablet 650 mg  650 mg Oral Q6H PRN Johnson, Clanford L, MD       Or   acetaminophen (TYLENOL) suppository 650 mg  650 mg Rectal Q6H PRN Johnson, Clanford L, MD       albumin human 25 % solution 50 g  50 g Intravenous Q8H Johnson, Clanford L, MD 60 mL/hr at 05/24/22 1449 50 g at 05/24/22 1449   [START ON 05/25/2022] aspirin EC tablet 81 mg  81 mg Oral Daily Johnson, Clanford L, MD       [START ON 05/25/2022] cefTRIAXone (ROCEPHIN) 2 g in sodium chloride 0.9 % 100 mL IVPB  2 g Intravenous Q24H Johnson, Clanford L, MD       dorzolamide (TRUSOPT) 2 % ophthalmic solution 1  drop  1 drop Both Eyes BID Johnson, Clanford L, MD   1 drop at 05/24/22 1432   [START ON 05/25/2022] fluticasone (FLONASE) 50 MCG/ACT nasal spray 2 spray  2 spray Each Nare Daily Johnson, Clanford L, MD       insulin aspart (novoLOG) injection 0-6 Units  0-6 Units Subcutaneous TID WC Johnson, Clanford L, MD       lactulose (CHRONULAC) 10 GM/15ML solution 20 g  20 g Oral TID Wynetta Emery, Clanford L, MD       multivitamin with minerals tablet 1 tablet  1 tablet Oral Daily Johnson, Clanford L, MD   1 tablet at 05/24/22 1433   nicotine (NICODERM CQ - dosed in mg/24 hours) patch 14 mg  14 mg Transdermal Daily PRN Johnson, Clanford L, MD       ondansetron (ZOFRAN) tablet 4 mg  4 mg Oral Q6H PRN Johnson, Clanford L, MD       Or   ondansetron (ZOFRAN) injection 4 mg  4 mg Intravenous Q6H PRN Johnson, Clanford L, MD       [START ON 05/25/2022] pantoprazole (PROTONIX) EC tablet 40 mg  40 mg Oral Daily Johnson, Clanford L, MD       sodium bicarbonate tablet 650 mg  650 mg Oral TID Wynetta Emery, Clanford L, MD       [START ON 05/25/2022] spironolactone (ALDACTONE) tablet 25 mg  25 mg Oral Daily Johnson, Clanford L, MD       [START ON 05/26/2022] torsemide (DEMADEX) tablet 10 mg  10 mg Oral QODAY Johnson, Clanford L, MD       traZODone (DESYREL) tablet 25 mg  25 mg Oral QHS PRN Johnson, Clanford L, MD       venlafaxine XR (EFFEXOR-XR) 24 hr capsule 150 mg  150 mg Oral BID Wynetta Emery, Clanford L, MD        Allergies as of 05/24/2022   (No Known Allergies)    Family History  Problem Relation Age  of Onset   Colon cancer Mother    Hypertension Mother    CAD Mother    Breast cancer Mother    Cirrhosis Brother     Social History   Socioeconomic History   Marital status: Married    Spouse name: Not on file   Number of children: 4   Years of education: Not on file   Highest education level: Not on file  Occupational History   Occupation: Barrister's clerk   Tobacco Use   Smoking status: Never   Smokeless tobacco:  Current    Types: Snuff   Tobacco comments:    Uses dip pouches occasionally  Vaping Use   Vaping Use: Never used  Substance and Sexual Activity   Alcohol use: No    Alcohol/week: 0.0 standard drinks of alcohol    Comment: Previously 1-2 cases of beer/week. patient states never on regular basis though. last etoh in 2013.    Drug use: No   Sexual activity: Not Currently  Other Topics Concern   Not on file  Social History Narrative   ** Merged History Encounter **       Social Determinants of Health   Financial Resource Strain: Low Risk  (01/04/2022)   Overall Financial Resource Strain (CARDIA)    Difficulty of Paying Living Expenses: Not hard at all  Food Insecurity: No Food Insecurity (05/24/2022)   Hunger Vital Sign    Worried About Running Out of Food in the Last Year: Never true    Ran Out of Food in the Last Year: Never true  Transportation Needs: No Transportation Needs (05/24/2022)   PRAPARE - Hydrologist (Medical): No    Lack of Transportation (Non-Medical): No  Physical Activity: Inactive (01/04/2022)   Exercise Vital Sign    Days of Exercise per Week: 0 days    Minutes of Exercise per Session: 0 min  Stress: No Stress Concern Present (01/04/2022)   [place]    Feeling of Stress : Only a little  Social Connections: Moderately Integrated (01/04/2022)   Social Connection and Isolation Panel [NHANES]    Frequency of Communication with Friends and Family: More than three times a week    Frequency of Social Gatherings with Friends and Family: Once a week    Attends Religious Services: More than 4 times per year    Active Member of [employer] or Organizations: No    Attends Archivist Meetings: Never    Marital Status: Married  Human resources officer Violence: Not At Risk (05/24/2022)   Humiliation, Afraid, Rape, and Kick questionnaire    Fear of Current or Ex-Partner: No     Emotionally Abused: No    Physically Abused: No    Sexually Abused: No    Review of Systems: Gen: Denies fever, chills, cold or flulike symptoms, presyncope, syncope. CV: Denies chest pain, heart palpitations. Resp: Denies shortness of breath, cough. GI: See HPI GU : Denies urinary burning, urinary frequency, urinary incontinence.  Psych: See HPI Heme: See HPI  Physical Exam: Vital signs in last 24 hours: Temp:  [98.1 F (36.7 C)-98.2 F (36.8 C)] 98.2 F (36.8 C) (03/19 1359) Pulse Rate:  [80-87] 80 (03/19 1359) Resp:  [18] 18 (03/19 1337) BP: (118-136)/(78-86) 136/78 (03/19 1359) SpO2:  [100 %] 100 % (03/19 1359) Weight:  [94.3 kg-96.9 kg] 96.9 kg (03/19 1359) Last BM Date : 05/24/22 General:   Alert,  Well-developed, well-nourished, pleasant and  cooperative in NAD Head:  Normocephalic and atraumatic. Eyes:  Sclera clear, no icterus.   Conjunctiva pink. Ears:  Normal auditory acuity. Lungs:  Clear throughout to auscultation.   No wheezes, crackles, or rhonchi. No acute distress. Heart:  Regular rate and rhythm; no murmurs, clicks, rubs,  or gallops. Abdomen:  Distended but soft with ascites and pitting edema. No masses. Normal bowel sounds. Non-tender. No guarding or rebound.   Rectal:  Deferred Msk:  Symmetrical without gross deformities. Normal posture. Extremities:  With 2-3+ pitting edema below the knees. Unable to assess thighs as patient has jeans on.  Neurologic:  Alert and  oriented x4;  grossly normal neurologically. Skin:  Intact without significant lesions or rashes. Cervical Nodes:  No significant cervical adenopathy. Psych:   Normal mood and affect.  Intake/Output from previous day: No intake/output data recorded. Intake/Output this shift: Total I/O In: 100 [IV Piggyback:100] Out: -   Lab Results: Recent Labs    05/24/22 1127  WBC 5.8  HGB 10.0*  HCT 28.8*  PLT 121*   BMET Recent Labs    05/24/22 1127  NA 139  K 3.6  CL 108  CO2 25   GLUCOSE 114*  BUN 19  CREATININE 1.38*  CALCIUM 8.4*   LFT Recent Labs    05/24/22 1127  PROT 5.3*  ALBUMIN 2.3*  AST 57*  ALT 31  ALKPHOS 144*  BILITOT 1.7*   PT/INR Recent Labs    05/24/22 1127  LABPROT 18.3*  INR 1.5*    Studies/Results: US Paracentesis  Result Date: 05/23/2022 INDICATION: Cirrhosis, ascites EXAM: ULTRASOUND GUIDED DIAGNOSTIC AND THERAPEUTIC PARACENTESIS MEDICATIONS: None. COMPLICATIONS: None immediate. PROCEDURE: Informed written consent was obtained from the patient after a discussion of the risks, benefits and alternatives to treatment. A timeout was performed prior to the initiation of the procedure. Initial ultrasound scanning demonstrates a large amount of ascites within the right lower abdominal quadrant. The right lower abdomen was prepped and draped in the usual sterile fashion. 1% lidocaine was used for local anesthesia. Following this, a 5 [place] Yueh catheter was introduced. An ultrasound image was saved for documentation purposes. The paracentesis was performed. The catheter was removed and a dressing was applied. The patient tolerated the procedure well without immediate post procedural complication. Patient received post-procedure intravenous albumin; see nursing notes for details. FINDINGS: A total of approximately 4 L of yellow ascitic fluid was removed. Samples were sent to the laboratory as requested by the clinical team. IMPRESSION: Successful ultrasound-guided paracentesis yielding 4 liters of peritoneal fluid. Electronically Signed   By: Lavonia Dana M.D.   On: 05/23/2022 11:03    Impression: 67 year old male with history of anxiety/depression, CVA, diabetes, HTN, HLD, GERD, decompensated cirrhosis felt to be related to NASH +/- alcohol use previously, presenting to the hospital today with concern for SBP due to paracentesis yesterday with Gram stain showing gram-positive cocci in the anaerobic bottle only.  Possible SBP:  Paracentesis  completed yesterday yielding 4 L.  PMN count 28.  Gram stain with gram-positive cocci in anaerobic bottle only.  Culture is pending.  Clinically, does not appear patient has SBP, but due to positive Gram stain and history of kidney dysfunction, erred on the side of caution and had patient present to the emergency room to initiate treatment.  He is currently on ceftriaxone and receiving IV albumin.  We will continue IV antibiotics while waiting for culture results.  He will also need IV albumin on day 3. If culture is  negative or with staph/strep, would suspect contaminant.   Cirrhosis: Secondary to NASH +/- alcohol.  Abstinent since 2013. MELD 3.0, 17 today. Cirrhosis has been complicated by recurrent ascites/peripheral edema and hepatic encephalopathy. First paracentesis required in 2021, but began requiring recurrent paras in August 2023 s/p TIPS in December 2023, but continued requiring frequent paras, up to twice weekly, as he developed renal insufficiency and diuretics were discontinued. On 3/4 he was found to have patent TIPS and underwent TIPS balloon expansion to 10 mm decreasing portosystemic pressure from 18 mmHg to 8 mmHg.  Patient reports this has helped decrease how rapidly ascites reaccumulates; however, he has had 2 paras since TIPs revision, last yesterday yielding 4 L with PMN less than 250, but Gram stain positive for gram-positive cocci in anaerobic bottle only as discussed above, now on ceftriaxone and albumin.  He saw nephrology yesterday who started spironolactone 25 mg daily and torsemide 10 mg Monday, Wednesday, Friday as his kidney function had improved with creatinine of 1.3 from a peak of 2.77.  Today, creatinine 1.38.  Will continue diuretic started by nephrology and monitor kidney functions closely.    Plan: IV ceftriaxone 2 g daily IV albumin 50 g every 8 hours x 3 doses. Will need IV albumin on day 3. Await paracentesis culture. Continue spironolactone 25 mg  daily. Continue torsemide 10 mg Monday, Wednesday, Friday. Monitor kidney function and electrolytes closely. Daily MELD labs.   LOS: 0 days    05/24/2022, 3:55 PM   Aliene Altes, [facility]

## 2022-05-24 NOTE — ED Provider Notes (Signed)
[facility] Provider Note  CSN: [id number] Arrival date & time: 05/24/22 [id number]  Chief Complaint(s) abnormal labs  HPI Shawn [name]. is a 67 y.o. male history of cirrhosis, diabetes, hypertension continue for abnormal lab.  Patient reports that he had a paracentesis yesterday, was called today to come in as there was concern for infection on his paracentesis.  He reports that he feels normal.  He specifically denies any abdominal pain, chest pain, shortness of breath, fevers or chills, nausea, vomiting cough, runny nose, sore throat, urinary symptoms.  He reports chronic unchanged leg swelling.  He does not know exactly what the lab was.   Past Medical History Past Medical History:  Diagnosis Date   ADD (attention deficit disorder)    ADHD    Anxiety 1987   mild (crowds, noises)   Anxiety    Attention deficit disorder    Cancer ([place])    Cirrhosis of liver (HCC)    CVA (cerebral vascular accident) ([place])    Depression    Diabetic neuropathy ([place])    Essential hypertension, benign    Long-standing history, negative secondary workup   Headache    History of cardiac catheterization    Widely patent coronary and renal arteries November 2013   Hyperlipidemia    Hypertension    whitecoat   Obstructive sleep apnea    cpap   Prediabetes    Sleep apnea    TIA (transient ischemic attack)    Possible, 2007   Type 2 diabetes mellitus ([place])    Venous stasis    Patient Active Problem List   Diagnosis Date Noted   Anemia 05/04/2022   Depression, major, single episode, moderate ([place]) 04/14/2022   Thrombocytopenia ([place]) 04/14/2022   S/P TIPS (transjugular intrahepatic portosystemic shunt) 02/14/2022   Depression, major, single episode, in partial remission ([place]) 11/01/2021   Cirrhosis of liver with ascites ([place])    FH: colon cancer    GAD (generalized anxiety disorder) 12/02/2019   Ascites 05/29/2019   GERD (gastroesophageal reflux  disease) 06/15/2017   Neck pain 12/30/2015   Morbid obesity ([place]) 08/13/2015   Cognitive dysfunction 03/19/2015   Cirrhosis, non-alcoholic ([place]) [id number]   Elevated LFTs 12/11/2014   Abnormal abdominal ultrasound 12/11/2014   ADD (attention deficit disorder) 05/02/2014   Fatty liver 12/19/2013   Type II diabetes mellitus, uncontrolled 12/19/2013   Fatigue 11/19/2013   History of cardiac catheterization 11/19/2013   Hyperlipidemia associated with type 2 diabetes mellitus ([place]) 09/12/2012   Obstructive sleep apnea 09/12/2012   Essential hypertension, benign 01/13/2012   Type 2 diabetes mellitus with stage 3b chronic kidney disease, without long-term current use of insulin ([place]) 01/13/2012   Home Medication(s) Prior to Admission medications   Medication Sig Start Date End Date Taking? Authorizing Provider  ascorbic Acid (VITAMIN C) 500 MG CPCR Take 500 mg by mouth daily.   Yes [provider]  aspirin EC 81 MG tablet Take 81 mg by mouth daily. Swallow whole.   Yes [provider]  Cholecalciferol (D3 VITAMIN PO) Take by mouth daily. 5000 IU daily   Yes [provider]  dorzolamide (TRUSOPT) 2 % ophthalmic solution 1 drop 2 (two) times daily. 04/04/22  Yes [provider]  fluticasone (FLONASE) 50 MCG/ACT nasal spray Place 2 sprays into both nostrils daily. 11/29/21  Yes Luking, Elayne Snare, MD  lactulose (CHRONULAC) 10 GM/15ML solution Take 30 mLs (20 g total) by mouth 3 (three) times daily. 05/23/22  05/23/23 Yes Erenest Rasher, PA-C  metFORMIN (GLUCOPHAGE) 500 MG tablet 1 qd Patient taking differently: Take 500 mg by mouth daily with breakfast. 11/01/21  Yes Luking, Elayne Snare, MD  Milk Thistle 1000 MG CAPS Take by mouth daily.   Yes [provider]  Omega-3 Fatty Acids (FISH OIL) 1000 MG CAPS Take by mouth daily.   Yes [provider]  pantoprazole (PROTONIX) 40 MG tablet Take 1 tablet (40 mg total) by mouth daily. 11/01/21  Yes [name], MD  sodium bicarbonate 650 MG tablet Take 650 mg by mouth 3 (three) times daily. Morning, evening, bedtime   Yes [provider]  spironolactone (ALDACTONE) 25 MG tablet Take 25 mg by mouth daily. 05/23/22 05/23/23 Yes [provider]  torsemide (DEMADEX) 10 MG tablet Take 10 mg by mouth every other day. For two weeks 05/23/22 05/23/23 Yes [provider]  venlafaxine XR (EFFEXOR-XR) 150 MG 24 hr capsule Take 1 capsule (150 mg total) by mouth 2 (two) times daily. 11/01/21  Yes [name], MD  vitamin E 180 MG (400 UNITS) capsule Take 400 Units by mouth daily.   Yes [provider]  zinc gluconate 50 MG tablet Take 50 mg by mouth daily.   Yes [provider]  Blood Glucose Monitoring Suppl DEVI 1 each by Does not apply route in the morning, at noon, and at bedtime. May substitute to any manufacturer covered by patient's insurance Dx E11.22 05/12/22   [name], MD  Glucose Blood (BLOOD GLUCOSE TEST STRIPS) STRP Use to check blood sugar once daily before meals. May substitute to any manufacturer covered by patient's insurance E11.22 05/12/22   [name], MD  Lancet Device MISC Use to check blood sugar once daily before meals. May substitute to any manufacturer covered by patient's insurance E11.22 05/12/22   [name], MD  Lancets Misc. MISC Use to check blood sugar once daily before meals. May substitute to any manufacturer covered by patient's insurance 05/12/22   Luking, Elayne Snare, MD  rifaximin (XIFAXAN) 550 MG TABS tablet Take 1 tablet (550 mg total) by mouth 2 (two) times daily. Patient not taking: Reported on 05/18/2022 04/13/22   Daneil Dolin, MD                                                                                                                                    Past Surgical History Past Surgical History:  Procedure Laterality Date   ANTERIOR CERVICAL DECOMP/DISCECTOMY FUSION N/A 12/30/2015   Procedure: ANTERIOR CERVICAL  DECOMPRESSION/DISCECTOMY FUSION C6 - C7 1 LEVEL;  Surgeon: Melina Schools, MD;  Location: [facility];  Service: Orthopedics;  Laterality: N/A;   CARDIAC CATHETERIZATION  01/2012   negative   CARPAL TUNNEL RELEASE     CERVICAL SPINE NERVE BLOCK  05/17/2017   IR INTRAVASCULAR ULTRASOUND NON CORONARY  02/14/2022   IR PARACENTESIS  02/14/2022   IR PARACENTESIS  05/09/2022   IR RADIOLOGIST EVAL & MGMT  12/03/2021   IR RADIOLOGIST EVAL & MGMT  01/13/2022   IR RADIOLOGIST EVAL & MGMT  03/24/2022   IR RADIOLOGIST EVAL & MGMT  04/20/2022   IR TIPS  02/14/2022   IR TIPS REVISION MOD SED  05/09/2022   IR US GUIDE VASC ACCESS RIGHT  02/14/2022   IR US GUIDE VASC ACCESS RIGHT  02/14/2022   IR US GUIDE VASC ACCESS RIGHT  05/09/2022   KNEE SURGERY Right    NECK SURGERY     RADIOLOGY WITH ANESTHESIA N/A 02/14/2022   Procedure: TIPS;  Surgeon: Suzette Battiest, MD;  Location: [facility];  Service: Radiology;  Laterality: N/A;   VASECTOMY     Family History Family History  Problem Relation Age of Onset   Colon cancer Mother    Hypertension Mother    CAD Mother    Breast cancer Mother    Cirrhosis Brother     Social History Social History   Tobacco Use   Smoking status: Never   Smokeless tobacco: Current    Types: Snuff   Tobacco comments:    Uses dip pouches occasionally  Vaping Use   Vaping Use: Never used  Substance Use Topics   Alcohol use: No    Alcohol/week: 0.0 standard drinks of alcohol    Comment: Previously 1-2 cases of beer/week. patient states never on regular basis though. last etoh in 2013.    Drug use: No   Allergies Patient has no known allergies.  Review of Systems Review of Systems  All other systems reviewed and are negative.   Physical Exam Vital Signs  I have reviewed the triage vital signs BP 132/86 (BP Location: Left Arm)   Pulse 87   Temp 98.2 F (36.8 C) (Oral)   Resp 18   Ht 5\' 5"  (1.651 m)   Wt 94.3 kg   SpO2 100%   BMI 34.61 kg/m  Physical Exam Vitals and  nursing note reviewed.  Constitutional:      General: He is not in acute distress.    Appearance: Normal appearance.  HENT:     Mouth/Throat:     Mouth: Mucous membranes are moist.  Eyes:     Conjunctiva/sclera: Conjunctivae normal.  Cardiovascular:     Rate and Rhythm: Normal rate and regular rhythm.  Pulmonary:     Effort: Pulmonary effort is normal. No respiratory distress.     Breath sounds: Normal breath sounds.  Abdominal:     General: Abdomen is flat. There is distension.     Palpations: Abdomen is soft.     Tenderness: There is no abdominal tenderness.  Musculoskeletal:     Right lower leg: Edema present.     Left lower leg: Edema present.  Skin:    General: Skin is warm and dry.     Capillary Refill: Capillary refill takes less than 2 seconds.  Neurological:     Mental Status: He is alert and oriented to person, place, and time. Mental status is at baseline.  Psychiatric:        Mood and Affect: Mood normal.        Behavior: Behavior normal.     ED Results and Treatments Labs (all labs ordered are listed, but only abnormal results are displayed) Labs Reviewed  COMPREHENSIVE METABOLIC PANEL - Abnormal; Notable for the following components:      Result Value   Glucose, Bld 114 (*)  Creatinine, Ser 1.38 (*)    Calcium 8.4 (*)    Total Protein 5.3 (*)    Albumin 2.3 (*)    AST 57 (*)    Alkaline Phosphatase 144 (*)    Total Bilirubin 1.7 (*)    GFR, Estimated 57 (*)    All other components within normal limits  CBC WITH DIFFERENTIAL/PLATELET - Abnormal; Notable for the following components:   RBC 3.00 (*)    Hemoglobin 10.0 (*)    HCT 28.8 (*)    RDW 15.8 (*)    Platelets 121 (*)    Eosinophils Absolute 0.6 (*)    All other components within normal limits  PROTIME-INR - Abnormal; Notable for the following components:   Prothrombin Time 18.3 (*)    INR 1.5 (*)    All other components within normal limits  CULTURE, BLOOD (ROUTINE X 2)  CULTURE, BLOOD  (ROUTINE X 2)                                                                                                                          Radiology No results found.  Pertinent labs & imaging results that were available during my care of the patient were reviewed by me and considered in my medical decision making (see MDM for details).  Medications Ordered in ED Medications  cefTRIAXone (ROCEPHIN) 2 g in sodium chloride 0.9 % 100 mL IVPB (has no administration in time range)  albumin human 25 % solution 50 g (has no administration in time range)                                                                                                                                     Procedures Procedures  (including critical care time)  Medical Decision Making / ED Course   MDM:  67 year old male presenting to the emergency department for reported abnormal lab.  Patient overall very well-appearing, does have some chronic abdominal distention and lower extremity edema.  No peritonitic signs or tenderness.  Reviewed paracentesis labs from yesterday, cultures negative and not concerning for SBP.  No blood work in chart.  Will obtain blood work.  Will discuss with GI as somewhat unclear why patient was sent to the emergency department.  Clinical Course as of 05/24/22 1245  Tue May 24, 2022  1237 Discussed with Dr. Jenetta Downer, recommends admission for ascitic fluid culture result [WS]  1241 Will page medicine.  [WS]  1243 Discussed with Dr. Wynetta Emery who will admit patient [WS]    Clinical Course User Index [WS] Truett Mainland Livingston Diones, MD     Additional history obtained: -Additional history obtained from spouse -External records from outside source obtained and reviewed including: Chart review including previous notes, labs, imaging, consultation notes including GI notes from recent visits   Lab Tests: -I ordered, reviewed, and interpreted labs.   The pertinent results include:   Labs  Reviewed  COMPREHENSIVE METABOLIC PANEL - Abnormal; Notable for the following components:      Result Value   Glucose, Bld 114 (*)    Creatinine, Ser 1.38 (*)    Calcium 8.4 (*)    Total Protein 5.3 (*)    Albumin 2.3 (*)    AST 57 (*)    Alkaline Phosphatase 144 (*)    Total Bilirubin 1.7 (*)    GFR, Estimated 57 (*)    All other components within normal limits  CBC WITH DIFFERENTIAL/PLATELET - Abnormal; Notable for the following components:   RBC 3.00 (*)    Hemoglobin 10.0 (*)    HCT 28.8 (*)    RDW 15.8 (*)    Platelets 121 (*)    Eosinophils Absolute 0.6 (*)    All other components within normal limits  PROTIME-INR - Abnormal; Notable for the following components:   Prothrombin Time 18.3 (*)    INR 1.5 (*)    All other components within normal limits  CULTURE, BLOOD (ROUTINE X 2)  CULTURE, BLOOD (ROUTINE X 2)    Notable for no leukocytosis, stable CKD, mild anemia   Medicines ordered and prescription drug management: Meds ordered this encounter  Medications   cefTRIAXone (ROCEPHIN) 2 g in sodium chloride 0.9 % 100 mL IVPB    Order Specific Question:   Antibiotic Indication:    Answer:   Bacteremia   albumin human 25 % solution 50 g    -I have reviewed the patients home medicines and have made adjustments as needed   Consultations Obtained: I requested consultation with the gastroenterologist,  and discussed lab and imaging findings as well as pertinent plan - they recommend: admit for monitoring   Cardiac Monitoring: The patient was maintained on a cardiac monitor.  I personally viewed and interpreted the cardiac monitored which showed an underlying rhythm of: NSR  Social Determinants of Health:  Diagnosis or treatment significantly limited by social determinants of health: obesity     Co morbidities that complicate the patient evaluation  Past Medical History:  Diagnosis Date   ADD (attention deficit disorder)    ADHD    Anxiety 1987   mild (crowds,  noises)   Anxiety    Attention deficit disorder    Cancer ([place])    Cirrhosis of liver ([place])    CVA (cerebral vascular accident) ([place])    Depression    Diabetic neuropathy ([place])    Essential hypertension, benign    Long-standing history, negative secondary workup   Headache    History of cardiac catheterization    Widely patent coronary and renal arteries November 2013   Hyperlipidemia    Hypertension    whitecoat   Obstructive sleep apnea    cpap   Prediabetes    Sleep apnea    TIA (transient ischemic attack)    Possible, 2007   Type 2 diabetes mellitus (HCC)    Venous stasis       Dispostion: Disposition decision including need  for hospitalization was considered, and patient admitted to the hospital.    Final Clinical Impression(s) / ED Diagnoses Final diagnoses:  Positive culture finding     This chart was dictated using voice recognition software.  Despite best efforts to proofread,  errors can occur which can change the documentation meaning.    Cristie Hem, MD 05/24/22 1245

## 2022-05-25 DIAGNOSIS — R188 Other ascites: Secondary | ICD-10-CM | POA: Diagnosis not present

## 2022-05-25 DIAGNOSIS — K7460 Unspecified cirrhosis of liver: Secondary | ICD-10-CM | POA: Diagnosis not present

## 2022-05-25 DIAGNOSIS — R7989 Other specified abnormal findings of blood chemistry: Secondary | ICD-10-CM | POA: Diagnosis not present

## 2022-05-25 DIAGNOSIS — D696 Thrombocytopenia, unspecified: Secondary | ICD-10-CM | POA: Diagnosis not present

## 2022-05-25 DIAGNOSIS — K652 Spontaneous bacterial peritonitis: Secondary | ICD-10-CM | POA: Diagnosis not present

## 2022-05-25 LAB — CBC
HCT: 25 % — ABNORMAL LOW (ref 39.0–52.0)
Hemoglobin: 8.4 g/dL — ABNORMAL LOW (ref 13.0–17.0)
MCH: 32.3 pg (ref 26.0–34.0)
MCHC: 33.6 g/dL (ref 30.0–36.0)
MCV: 96.2 fL (ref 80.0–100.0)
Platelets: 110 10*3/uL — ABNORMAL LOW (ref 150–400)
RBC: 2.6 MIL/uL — ABNORMAL LOW (ref 4.22–5.81)
RDW: 15.9 % — ABNORMAL HIGH (ref 11.5–15.5)
WBC: 4.4 10*3/uL (ref 4.0–10.5)
nRBC: 0 % (ref 0.0–0.2)

## 2022-05-25 LAB — GLUCOSE, CAPILLARY
Glucose-Capillary: 103 mg/dL — ABNORMAL HIGH (ref 70–99)
Glucose-Capillary: 125 mg/dL — ABNORMAL HIGH (ref 70–99)
Glucose-Capillary: 99 mg/dL (ref 70–99)
Glucose-Capillary: 101 mg/dL — ABNORMAL HIGH (ref 70–99)

## 2022-05-25 LAB — LIPID PANEL
Cholesterol: 133 mg/dL (ref 0–200)
HDL: 33 mg/dL — ABNORMAL LOW (ref >40)
LDL Cholesterol: 78 mg/dL (ref 0–99)
Total CHOL/HDL Ratio: 4 RATIO
Triglycerides: 111 mg/dL (ref <150)
VLDL: 22 mg/dL (ref 0–40)

## 2022-05-25 LAB — COMPREHENSIVE METABOLIC PANEL
ALT: 24 U/L (ref 0–44)
AST: 44 U/L — ABNORMAL HIGH (ref 15–41)
Albumin: 3 g/dL — ABNORMAL LOW (ref 3.5–5.0)
Alkaline Phosphatase: 112 U/L (ref 38–126)
Anion gap: 8 (ref 5–15)
BUN: 20 mg/dL (ref 8–23)
CO2: 24 mmol/L (ref 22–32)
Calcium: 8.4 mg/dL — ABNORMAL LOW (ref 8.9–10.3)
Chloride: 109 mmol/L (ref 98–111)
Creatinine, Ser: 1.24 mg/dL (ref 0.61–1.24)
GFR, Estimated: >60 mL/min (ref >60)
Glucose, Bld: 121 mg/dL — ABNORMAL HIGH (ref 70–99)
Potassium: 2.9 mmol/L — ABNORMAL LOW (ref 3.5–5.1)
Sodium: 141 mmol/L (ref 135–145)
Total Bilirubin: 1.9 mg/dL — ABNORMAL HIGH (ref 0.3–1.2)
Total Protein: 5.3 g/dL — ABNORMAL LOW (ref 6.5–8.1)

## 2022-05-25 LAB — MAGNESIUM: Magnesium: 2.1 mg/dL (ref 1.7–2.4)

## 2022-05-25 LAB — PROTIME-INR
INR: 1.8 — ABNORMAL HIGH (ref 0.8–1.2)
Prothrombin Time: 20.8 seconds — ABNORMAL HIGH (ref 11.4–15.2)

## 2022-05-25 LAB — HEMOGLOBIN AND HEMATOCRIT, BLOOD
HCT: 26.4 % — ABNORMAL LOW (ref 39.0–52.0)
Hemoglobin: 8.7 g/dL — ABNORMAL LOW (ref 13.0–17.0)

## 2022-05-25 MED ORDER — POTASSIUM CHLORIDE CRYS ER 20 MEQ PO TBCR
40.0000 meq | EXTENDED_RELEASE_TABLET | Freq: Two times a day (BID) | ORAL | Status: AC
  Administered 2022-05-25 (×2): 40 meq via ORAL
  Filled 2022-05-25 (×2): qty 2

## 2022-05-25 NOTE — Progress Notes (Signed)
Pt refused CPAP machine for tonight will call if he changes his mind

## 2022-05-25 NOTE — Progress Notes (Signed)
  Transition of Care [facility]) Screening Note   Patient Details  Name: Shawn [name]. Date of Birth: 23-May-1955   [facility]) CM/SW Contact:    Ihor Gully, LCSW Phone Number: 05/25/2022, 10:31 AM    Transition of Care Department [facility]) has reviewed patient and no TOC needs have been identified at this time. We will continue to monitor patient advancement through interdisciplinary progression rounds. If new patient transition needs arise, please place a TOC consult.

## 2022-05-25 NOTE — Progress Notes (Signed)
Subjective: Patient sitting up in chair upon assessment. Feeling pretty good, no abdominal pain, nausea, or vomiting. Last BM was around 4pm yesterday. Feels that swelling to abdomen is stable, no worse today.   Objective: Vital signs in last 24 hours: Temp:  [97.9 F (36.6 C)-98.9 F (37.2 C)] 98.9 F (37.2 C) (03/20 0746) Pulse Rate:  [80-89] 86 (03/20 0746) Resp:  [14-20] 14 (03/20 0746) BP: (112-136)/(71-86) 112/86 (03/20 0746) SpO2:  [99 %-100 %] 99 % (03/20 0746) Weight:  [94.3 kg-96.9 kg] 96.9 kg (03/19 1359) Last BM Date : 05/25/22 General:   Alert and oriented, pleasant Head:  Normocephalic and atraumatic. Eyes:  No icterus, sclera clear. Conjuctiva pink.  Mouth:  Without lesions, mucosa pink and moist.  Heart:  S1, S2 present, no murmurs noted.  Lungs: Clear to auscultation bilaterally, without wheezing, rales, or rhonchi.  Abdomen:  Bowel sounds present, soft, non-tender, abdomen is full but soft. No HSM or hernias noted. No rebound or guarding. No masses appreciated  Msk:  Symmetrical without gross deformities. Normal posture. Pulses:  Normal pulses noted. Extremities:  3+ pitting edema  Neurologic:  Alert and  oriented x4;  grossly normal neurologically. Skin:  Warm and dry, intact without significant lesions.  Psych:  Alert and cooperative. Normal mood and affect.  Intake/Output from previous day: 03/19 0701 - 03/20 0700 In: 982.8 [P.O.:720; IV Piggyback:262.8] Out: 200 [Urine:200] Intake/Output this shift: No intake/output data recorded.  Lab Results: Recent Labs    05/24/22 1127 05/25/22 0447  WBC 5.8 4.4  HGB 10.0* 8.4*  HCT 28.8* 25.0*  PLT 121* 110*   BMET Recent Labs    05/24/22 1127 05/25/22 0447  NA 139 141  K 3.6 2.9*  CL 108 109  CO2 25 24  GLUCOSE 114* 121*  BUN 19 20  CREATININE 1.38* 1.24  CALCIUM 8.4* 8.4*   LFT Recent Labs    05/24/22 1127 05/25/22 0447  PROT 5.3* 5.3*  ALBUMIN 2.3* 3.0*  AST 57* 44*  ALT 31 24   ALKPHOS 144* 112  BILITOT 1.7* 1.9*   PT/INR Recent Labs    05/24/22 1127 05/25/22 0447  LABPROT 18.3* 20.8*  INR 1.5* 1.8*    Assessment: Shawn [name]. Is a 67 year old male with history of anxiety/depression, CVA, diabetes, HTN, HLD, GERD, decompensated cirrhosis felt to be related to NASH +/- alcohol use previously, presenting to the hospital tuesday with concern for SBP due to paracentesis yesterday with Gram stain showing gram-positive cocci in the anaerobic bottle only.   Possible SBP: Para yielding 4L done Monday. PMN 28 Gram stain w/gram-positive cocci in anaerobic bottle only.  Culture is pending.  Clinically, does not appear patient has SBP, but due to positive Gram stain patient recommended to proceed to ED for treatment. He is currently on ceftriaxone and receiving IV albumin. WBC remains stable. He denies abdominal pain. will continue IV antibiotics while waiting for culture results.  He will also need IV albumin on day 3. If culture is negative or with staph/strep, would suspect contaminant and will not need long term prophylaxis coverage.   Cirrhosis: secondary to NASH +/- ETOH. No ETOH since 2013. MELD 3.0 17. Cirrhosis has been complicated by recurrent ascites/peripheral edema and HE. began requiring recurrent paras up to twice weekly in August 2023 s/p TIPS in December 2023, developed renal insufficiency and diuretics were discontinued. On 3/4 he was found to have patent TIPS and underwent TIPS balloon expansion to 10 mm decreasing portosystemic  pressure from 18 mmHg to 8 mmHg.  Patient reports this has helped decrease how rapidly ascites reaccumulates; however, he has had 2 paras since TIPS on 3/4. On Monday, Nephrology started spironolactone 25 mg daily and torsemide 10 mg Monday, Wednesday, Friday as his kidney function had improved with creatinine of 1.3 from a peak of 2.77.  creatnine 1.24 today, will continue to monitor closely.    Plan: Continue IV rocephin IV  albumin 50g Q8h x3 IV albumin on day 3 Follow for para culture results  Continue spironolactone 25mg  daily Continue torsemide 10mg  MWF Monitor renal function and elctrolytes closely Daily MELD labs   LOS: 1 day    05/25/2022, 9:19 AM   Kaisyn Reinhold L. Alver Sorrow, MSN, APRN, AGNP-C Adult-Gerontology Nurse Practitioner [facility] at [facility]

## 2022-05-25 NOTE — Progress Notes (Addendum)
PROGRESS NOTE    Shawn [name].  [id number] DOB: 05/21/1955 DOA: 05/24/2022 PCP: [name], MD  Brief Narrative:  Shawn [name]. is a 67 y.o. male with medical history significant for cirrhosis, ascites requiring weekly paracentesis, T2DM, HTN, OSA, and depression presenting to ED on 3/19 for admission per outpatient gastroenterologist office.  On 3/18, patient received R-sided paracentesis with 4 L ascites removed and ascitic fluid analysis showed G+ cocci.  Patient was called and instructed to present to ED urgently for IV antibiotics for possible SBP.  Patient was started on 2 g CTX daily and blood cultures were drawn in ED.  Labs significant for Cr 1.38, AST 57/ALT 31, Tbili 1.7, and anemia of 10.0 Hgb as well as thrombocytopenia of 121.  PT elevated to 18.3 and INR 1.5.  Hospitalist service consulted for admission and IV antibiotics for SBP.  Currently, there is a question whether G+ cocci are a contaminant, but antibiotics continuing pending result of ascites cultures.  Problem List:   Principal Problem:   SBP (spontaneous bacterial peritonitis) ([place]) Active Problems:   Essential hypertension, benign   Type 2 diabetes mellitus with stage 3b chronic kidney disease, without long-term current use of insulin (HCC)   Hyperlipidemia associated with type 2 diabetes mellitus ([place])   Obstructive sleep apnea   Fatty liver   Elevated LFTs   Cirrhosis, non-alcoholic (HCC)   Cognitive dysfunction   Morbid obesity (HCC)   GERD (gastroesophageal reflux disease)   Ascites   GAD (generalized anxiety disorder)   Cirrhosis of liver with ascites (HCC)   S/P TIPS (transjugular intrahepatic portosystemic shunt)   Thrombocytopenia (HCC)   Anemia   Positive culture finding  Assessment and Plan:  SBP; gram positive cocci seen in ascitic fluid The G+ cocci may be contaminant as they grew in only one anaerobic bottle, but SBP has high mortality and will treat pending further  speciation of ascitic fluid cultures. - CTX 1 g daily, day 2 of 5 - f/u ascites cultures, currently pending   Cirrhosis, fatty liver Portal hypertension s/p TIPS Ascites Does not currently appear encephalopathic and is likely near baseline mental status.  Last paracentesis 3/18 drew off 4 L.  S/p 1 dose of albumin 1.5 g/kg on admission. - Continue home lactulose 20 g TID - Monitor volume status, mental status closely - GI consulted, appreciate recs   Thrombocytopenia, worsening Elevated PT/INR, increasing Likely secondary to hepatic dysfunction.  Will use caution with anticoagulation and monitor closely.  Has had extensive workup by Heme/Onc on 3/13, negative ANA, RF, hep B/C serology.  Low platelet production. - Recheck PT/INR tomorrow morning - Follow up outpatient with Hematologist   Chronic anemia, worse from admission Hgb trend 10.4 > 8.4 today, but no signs of active bleeding and stable on recheck at 11 am.  As noted above, patient seen by Heme/Onc for workup.  No signs of hemolysis or malignancy.  Folate, MMA normal.  Negative FOBT.  Likely CKD or anemia of chronic disease.  Does have low serum copper, which could be contributing. - Follow up outpatient with Hematologist - Trend H&H daily  Hypokalemia K 2.9 this morning, likely in setting of diuresis and missing a day of spironolactone yesterday, now resumed. - Goal K>3.5, monitor daily and replete as necessary - Continue spironolactone/torsemide combo  HFpEF TTE showed G1DD on 01/2022, LVEF 70-75%.  Does appear to be volume overloaded, will consider IV furosemide when hypokalemia is improved. - Continue home torsemide 10  mg every other day - Monitor volume status, strict I/Os  T2DM CBGs ranging 80-130, not requiring insulin at this time. - Held metformin in setting of hospitalization - vsSSI and CBGs 5 times daily  OSA Reports inconsistent compliance with CPAP at home. - CPAP nightly  HLD - Lipid panel ordered    GERD - Continue pantoprazole 40 mg daily   Depression - Continue home venlafaxine 150 mg BID  Hx CVA - Continue aspirin 81 mg   Tobacco use disorder Patient noted to intermittently use snuff. - Nicotine patches PRN ordered   Obesity BMI 34.81 on admission.  DVT prophylaxis: SCDs, medical prophylaxis held in setting of elevated INR (HAS-BLED score of 6)  Code Status: Full Family Communication: Wife updated at bedside 3/19 Disposition Plan: Patient is from: Home Anticipated d/c is to: Home Anticipated d/c date is: 3/23, earlier if ascites fluid cultures negative Patient currently: Pending ascites fluid cultures and SBP treatment Admission Status is: Inpatient Remains inpatient appropriate because: IV antibiotics required for possible SBP treatment  Consultants:  Gastroenterology  Procedures:  None  Antimicrobials:  CTX 2g  3/19>> (5 days planned)  Subjective: Patient seen and evaluated today with no new acute complaints or concerns. No acute concerns or events noted overnight.  He is feeling well and hoping to get out of the hospital as soon as possible, expresses some frustration with hospitalization in setting of "fine" health leading up to admission.  Denies chills, rigors, arthralgias, nausea/vomiting/diarrhea, or any localized pain.  States that he can feel when he needs paracentesis and ascites accumulates, feeling well at this time.  Objective: Vitals:   05/24/22 2029 05/24/22 2230 05/25/22 0502 05/25/22 0746  BP: 134/74 114/76 122/71 112/86  Pulse: 88 86 89 86  Resp: 20 20 16 14   Temp: 98.6 F (37 C) 98.9 F (37.2 C) 97.9 F (36.6 C) 98.9 F (37.2 C)  TempSrc: Oral Oral Oral Oral  SpO2: 100% 100% 100% 99%  Weight:      Height:        Intake/Output Summary (Last 24 hours) at 05/25/2022 0815 Last data filed at 05/25/2022 0300 Gross per 24 hour  Intake 742.81 ml  Output 200 ml  Net 542.81 ml   Filed Weights   05/24/22 1016 05/24/22 1359  Weight:  94.3 kg 96.9 kg    Examination: General: Elderly man with distended abdomen, tired appearing resting in chair in no acute distress. HEENT: No scleral icterus.  Moist mucous membranes.  NCAT. Lungs: CTAB.  Normal work of breathing on room air. Cardiovascular: RRR.  Normal S1/S2.  No murmurs/rubs/gallops.  No JVD. Abdomen: Moderately distended abdomen with shifting dullness.  Nontender.  No rebound or guarding. Extremities: 1+ peripheral edema bilaterally.  Capillary refill less than 2 seconds.  No cyanosis or clubbing. Skin: Mildly jaundiced, no rashes or lesions grossly.  Scattered purpura and scabbed scratches on bilateral arms, patient unsure of etiology. Neuro: Alert and oriented x4.  No asterixis.  No focal neurological deficit.  Some slowing of speech.  Data Reviewed: I have personally reviewed following labs and imaging studies.  CBC: Recent Labs  Lab 05/18/22 1409 05/24/22 1127 05/25/22 0447  WBC 7.2 5.8 4.4  NEUTROABS  --  3.7  --   HGB 10.2* 10.0* 8.4*  HCT 29.8* 28.8* 25.0*  MCV 94.9 96.0 96.2  PLT 133* 121* [id number]*   Basic Metabolic Panel: Recent Labs  Lab 05/18/22 1410 05/24/22 1127 05/25/22 0447  NA 139 139 141  K 3.3* 3.6  2.9*  CL 110 108 109  CO2 23 25 24   GLUCOSE 85 114* 121*  BUN 19 19 20   CREATININE 1.30* 1.38* 1.24  CALCIUM 8.4* 8.4* 8.4*  MG  --   --  2.1  PHOS 2.8  --   --    GFR: Estimated Creatinine Clearance: 63.6 mL/min (by C-G formula based on SCr of 1.24 mg/dL). Liver Function Tests: Recent Labs  Lab 05/18/22 1410 05/24/22 1127 05/25/22 0447  AST  --  57* 44*  ALT  --  31 24  ALKPHOS  --  144* 112  BILITOT  --  1.7* 1.9*  PROT  --  5.3* 5.3*  ALBUMIN 2.1* 2.3* 3.0*   No results for input(s): "LIPASE", "AMYLASE" in the last 168 hours. No results for input(s): "AMMONIA" in the last 168 hours. Coagulation Profile: Recent Labs  Lab 05/24/22 1127 05/25/22 0447  INR 1.5* 1.8*   Cardiac Enzymes: No results for input(s): "CKTOTAL",  "CKMB", "CKMBINDEX", "TROPONINI" in the last 168 hours. BNP (last 3 results) No results for input(s): "PROBNP" in the last 8760 hours. HbA1C: No results for input(s): "HGBA1C" in the last 72 hours. CBG: Recent Labs  Lab 05/24/22 1608 05/24/22 2059 05/25/22 0722  GLUCAP 165* 80 103*   Lipid Profile: Recent Labs    05/25/22 0447  CHOL 133  HDL 33*  LDLCALC 78  TRIG 111  CHOLHDL 4.0   Thyroid Function Tests: No results for input(s): "TSH", "T4TOTAL", "FREET4", "T3FREE", "THYROIDAB" in the last 72 hours. Anemia Panel: No results for input(s): "VITAMINB12", "FOLATE", "FERRITIN", "TIBC", "IRON", "RETICCTPCT" in the last 72 hours. Sepsis Labs: No results for input(s): "PROCALCITON", "LATICACIDVEN" in the last 168 hours.  Recent Results (from the past 240 hour(s))  Gram stain     Status: None   Collection Time: 05/19/22  8:15 AM   Specimen: Ascitic  Result Value Ref Range Status   Specimen Description ASCITIC  Final   Special Requests NONE  Final   Gram Stain   Final    NO ORGANISMS SEEN WBC PRESENT,BOTH PMN AND MONONUCLEAR CYTOSPIN SMEAR Performed at [facility], [address] [place], [place] [address]    Report Status 05/19/2022 FINAL  Final  Culture, body fluid w Gram Stain-bottle     Status: None   Collection Time: 05/19/22  8:15 AM   Specimen: Ascitic  Result Value Ref Range Status   Specimen Description ASCITIC  Final   Special Requests BAA 10CC  Final   Culture   Final    NO GROWTH 5 DAYS Performed at [facility], [address], [place], [place] [address]    Report Status 05/24/2022 FINAL  Final  Culture, body fluid w Gram Stain-bottle     Status: None (Preliminary result)   Collection Time: 05/23/22  8:20 AM   Specimen: Ascitic  Result Value Ref Range Status   Specimen Description   Final    ASCITIC Performed at [facility], [address], [place], [place] [address]    Special Requests   Final    BOTTLES DRAWN AEROBIC AND ANAEROBIC Blood Culture  adequate volume Performed at [facility], [address] [place], [place] [address]    Gram Stain   Final    GRAM POSITIVE COCCI ANAEROBIC BOTTLE ONLY Gram Stain Report Called to,Read Back By and Verified With: DR. Cherlynn June ON [id number] AT [place]. Performed at [facility], [address] [place], [place] [address]    Culture   Final  CULTURE REINCUBATED FOR BETTER GROWTH Performed at [facility], [place] [address] [place], [place] [address]    Report Status PENDING  Incomplete  Gram stain     Status: None   Collection Time: 05/23/22  8:20 AM   Specimen: Ascitic  Result Value Ref Range Status   Specimen Description ASCITIC  Final   Special Requests NONE  Final   Gram Stain   Final    WBC PRESENT,BOTH PMN AND MONONUCLEAR NO ORGANISMS SEEN CYTOSPIN SMEAR Performed at [facility], [address], [place], [place] [address]    Report Status 05/23/2022 FINAL  Final  Blood culture (routine x 2)     Status: None (Preliminary result)   Collection Time: 05/24/22 12:58 PM   Specimen: BLOOD  Result Value Ref Range Status   Specimen Description BLOOD BLOOD LEFT ARM  Final   Special Requests   Final    BOTTLES DRAWN AEROBIC ONLY Blood Culture adequate volume   Culture   Final    NO GROWTH < 24 HOURS Performed at [facility], [address], [place], [place] [address]    Report Status PENDING  Incomplete  Blood culture (routine x 2)     Status: None (Preliminary result)   Collection Time: 05/24/22  1:10 PM   Specimen: BLOOD  Result Value Ref Range Status   Specimen Description BLOOD BLOOD RIGHT ARM  Final   Special Requests   Final    BOTTLES DRAWN AEROBIC AND ANAEROBIC Blood Culture results may not be optimal due to an excessive volume of blood received in culture bottles   Culture   Final    NO GROWTH < 24 HOURS Performed at [facility], [address] [place], [place] [address]    Report Status PENDING  Incomplete     Radiology Studies: US  Paracentesis  Result Date: 05/23/2022 INDICATION: Cirrhosis, ascites EXAM: ULTRASOUND GUIDED DIAGNOSTIC AND THERAPEUTIC PARACENTESIS MEDICATIONS: None. COMPLICATIONS: None immediate. PROCEDURE: Informed written consent was obtained from the patient after a discussion of the risks, benefits and alternatives to treatment. A timeout was performed prior to the initiation of the procedure. Initial ultrasound scanning demonstrates a large amount of ascites within the right lower abdominal quadrant. The right lower abdomen was prepped and draped in the usual sterile fashion. 1% lidocaine was used for local anesthesia. Following this, a 5 [place] Yueh catheter was introduced. An ultrasound image was saved for documentation purposes. The paracentesis was performed. The catheter was removed and a dressing was applied. The patient tolerated the procedure well without immediate post procedural complication. Patient received post-procedure intravenous albumin; see nursing notes for details. FINDINGS: A total of approximately 4 L of yellow ascitic fluid was removed. Samples were sent to the laboratory as requested by the clinical team. IMPRESSION: Successful ultrasound-guided paracentesis yielding 4 liters of peritoneal fluid. Electronically Signed   By: Lavonia Dana M.D.   On: 05/23/2022 11:03    Scheduled Meds:  aspirin EC  81 mg Oral Daily   dorzolamide  1 drop Both Eyes BID   fluticasone  2 spray Each Nare Daily   insulin aspart  0-6 Units Subcutaneous TID WC   lactulose  20 g Oral TID   multivitamin with minerals  1 tablet Oral Daily   pantoprazole  40 mg Oral Daily   potassium chloride  40 mEq Oral BID   sodium bicarbonate  650 mg Oral TID   spironolactone  25 mg Oral Daily   [START ON 05/26/2022] torsemide  10 mg Oral QODAY   venlafaxine XR  150 mg Oral BID   Continuous Infusions:  cefTRIAXone (ROCEPHIN)  IV       LOS: 1 day   Time spent: 35 minutes  Rosaland Shiffman, MS4 [facility]  If 7PM-7AM, please contact night-coverage [email or web address] 05/25/2022, 8:15 AM

## 2022-05-26 ENCOUNTER — Inpatient Hospital Stay (HOSPITAL_COMMUNITY): Payer: Medicare Other

## 2022-05-26 ENCOUNTER — Telehealth: Payer: Self-pay | Admitting: Gastroenterology

## 2022-05-26 DIAGNOSIS — R895 Abnormal microbiological findings in specimens from other organs, systems and tissues: Secondary | ICD-10-CM

## 2022-05-26 DIAGNOSIS — D696 Thrombocytopenia, unspecified: Secondary | ICD-10-CM | POA: Diagnosis not present

## 2022-05-26 DIAGNOSIS — K652 Spontaneous bacterial peritonitis: Secondary | ICD-10-CM | POA: Diagnosis not present

## 2022-05-26 DIAGNOSIS — R7989 Other specified abnormal findings of blood chemistry: Secondary | ICD-10-CM | POA: Diagnosis not present

## 2022-05-26 DIAGNOSIS — K7460 Unspecified cirrhosis of liver: Secondary | ICD-10-CM | POA: Diagnosis not present

## 2022-05-26 LAB — COMPREHENSIVE METABOLIC PANEL
ALT: 24 U/L (ref 0–44)
AST: 47 U/L — ABNORMAL HIGH (ref 15–41)
Albumin: 3.1 g/dL — ABNORMAL LOW (ref 3.5–5.0)
Alkaline Phosphatase: 105 U/L (ref 38–126)
Anion gap: 6 (ref 5–15)
BUN: 21 mg/dL (ref 8–23)
CO2: 26 mmol/L (ref 22–32)
Calcium: 8.7 mg/dL — ABNORMAL LOW (ref 8.9–10.3)
Chloride: 110 mmol/L (ref 98–111)
Creatinine, Ser: 1.29 mg/dL — ABNORMAL HIGH (ref 0.61–1.24)
GFR, Estimated: >60 mL/min (ref >60)
Glucose, Bld: 121 mg/dL — ABNORMAL HIGH (ref 70–99)
Potassium: 3.6 mmol/L (ref 3.5–5.1)
Sodium: 142 mmol/L (ref 135–145)
Total Bilirubin: 1.9 mg/dL — ABNORMAL HIGH (ref 0.3–1.2)
Total Protein: 5.5 g/dL — ABNORMAL LOW (ref 6.5–8.1)

## 2022-05-26 LAB — CBC
HCT: 25.9 % — ABNORMAL LOW (ref 39.0–52.0)
Hemoglobin: 8.7 g/dL — ABNORMAL LOW (ref 13.0–17.0)
MCH: 32.8 pg (ref 26.0–34.0)
MCHC: 33.6 g/dL (ref 30.0–36.0)
MCV: 97.7 fL (ref 80.0–100.0)
Platelets: 128 10*3/uL — ABNORMAL LOW (ref 150–400)
RBC: 2.65 MIL/uL — ABNORMAL LOW (ref 4.22–5.81)
RDW: 15.9 % — ABNORMAL HIGH (ref 11.5–15.5)
WBC: 5.2 10*3/uL (ref 4.0–10.5)
nRBC: 0 % (ref 0.0–0.2)

## 2022-05-26 LAB — GLUCOSE, CAPILLARY
Glucose-Capillary: 122 mg/dL — ABNORMAL HIGH (ref 70–99)
Glucose-Capillary: 92 mg/dL (ref 70–99)
Glucose-Capillary: 118 mg/dL — ABNORMAL HIGH (ref 70–99)

## 2022-05-26 LAB — BODY FLUID CELL COUNT WITH DIFFERENTIAL
Eos, Fluid: 0 %
Lymphs, Fluid: 56 %
Monocyte-Macrophage-Serous Fluid: 41 % — ABNORMAL LOW (ref 50–90)
Neutrophil Count, Fluid: 3 % (ref 0–25)
Total Nucleated Cell Count, Fluid: 233 cu mm (ref 0–1000)

## 2022-05-26 LAB — GRAM STAIN: Gram Stain: NONE SEEN

## 2022-05-26 LAB — PROTIME-INR
INR: 1.7 — ABNORMAL HIGH (ref 0.8–1.2)
Prothrombin Time: 19.5 seconds — ABNORMAL HIGH (ref 11.4–15.2)

## 2022-05-26 MED ORDER — ALBUMIN HUMAN 25 % IV SOLN
50.0000 g | Freq: Once | INTRAVENOUS | Status: AC
  Administered 2022-05-26: 50 g via INTRAVENOUS
  Filled 2022-05-26: qty 200

## 2022-05-26 NOTE — Procedures (Signed)
PreOperative Dx: Cirrhosis, ascites °Postoperative Dx: Cirrhosis, ascites °Procedure:   US guided paracentesis °Radiologist:  Echo [place]sbrook °Anesthesia:  10 ml of1% lidocaine °Specimen:  4 L of yellow ascitic fluid °EBL:   < 1 ml °Complications: None   °

## 2022-05-26 NOTE — Discharge Summary (Signed)
Physician Discharge Summary  Shawn [name]. [id number] DOB: 31-Jul-1955 DOA: 05/24/2022  PCP: [name], MD  Admit date: 05/24/2022  Discharge date: 05/26/2022  Admitted From: Home  Disposition: Home  Recommendations for Outpatient Follow-up:  Follow up with PCP in 1-2 weeks Please obtain BMP/CBC in one week GI follow up to be scheduled in 1-2 weeks, will require regularly scheduled paracentesis Patient expecting call from Heme/Onc to discuss lab results from 3/13 thrombocytopenia and anemia workup  Home Health: None  Equipment/Devices: None  Discharge Condition: Stable  CODE STATUS: Full  Diet recommendation: Heart Healthy/Carb Modified  Brief/Interim Summary: Shawn [name]. is a 67 y.o. male with medical history significant for cirrhosis, ascites requiring weekly paracentesis, T2DM, HTN, OSA, and depression presenting to ED on 3/19 for admission per outpatient gastroenterologist office.  On 3/18, patient received R-sided paracentesis with 4 L ascites removed and ascitic fluid analysis showed G+ cocci.  Patient was called and instructed to present to ED urgently for IV antibiotics for possible SBP.  Patient was started on 2 g CTX daily and blood cultures were drawn in ED.  Labs significant for Cr 1.38, AST 57/ALT 31, Tbili 1.7, and anemia of 10.0 Hgb as well as thrombocytopenia of 121.  PT elevated to 18.3 and INR 1.5.  Hospitalist service consulted for admission and IV antibiotics for SBP, continued on therapy until Day 3 of hospitalization when ascites cultures grew Strep salivarus, a likely contaminant.  Antibiotics discontinued and patient discharged.  Discharge Diagnoses:  Principal Problem:   SBP (spontaneous bacterial peritonitis) ([place]) Active Problems:   Essential hypertension, benign   Type 2 diabetes mellitus with stage 3b chronic kidney disease, without long-term current use of insulin (HCC)   Hyperlipidemia associated with type 2 diabetes mellitus  ([place])   Obstructive sleep apnea   Fatty liver   Elevated LFTs   Cirrhosis, non-alcoholic (HCC)   Cognitive dysfunction   Morbid obesity (HCC)   GERD (gastroesophageal reflux disease)   Ascites   GAD (generalized anxiety disorder)   Cirrhosis of liver with ascites (HCC)   S/P TIPS (transjugular intrahepatic portosystemic shunt)   Thrombocytopenia (HCC)   Anemia   Positive culture finding  Hospitalized with concern for SBP in the setting of ascites cultures positive for G+ cocci following regularly scheduled paracentesis.  SBP; gram positive cocci seen in ascitic fluid, ruled out Presented per GI outpatient office request after ascitic fluid Gram stain showed G+ cocci in a single anaerobic tube and patient was started on IV antibiotics for presumed SBP.  Ascitic fluid cultures grew Strep salivarus on Day 3 of hospitalization, a likely contaminant, and patient was discharged.  No antibiotics prescribed at discharge. - Received 2 days of CTX 1 g daily   Cirrhosis, fatty liver Portal hypertension s/p TIPS Ascites Did not appear encephalopathic and was at baseline mental status this admission.  Last paracentesis 3/18 drew off 4 L.  IV albumin administered intermittently per GI. - Continued home lactulose 20 g TID - Paracentesis schedule per GI   Thrombocytopenia Elevated PT/INR Plt 128 on discharge, likely secondary to hepatic dysfunction.  Used caution with anticoagulation.  Has had extensive workup by Heme/Onc on 3/13, negative ANA, RF, hep B/C serology.  Low platelet production. - Follow up outpatient with Hematologist   Chronic anemia Hgb trend 10.4 > 8.7, but no signs of active bleeding and stable prior to discharge.  As noted above, patient seen by Heme/Onc for workup.  No signs of hemolysis or  malignancy.  Folate, MMA normal.  Negative FOBT.  Likely CKD or anemia of chronic disease.  Does have low serum copper, which could be contributing. - Follow up outpatient with  Hematologist   Hypokalemia Briefly hypokalemic with K 2.9, likely in setting of diuresis and missing a day of spironolactone on admission.  Normalized with repletion. - Continued spironolactone/torsemide combo   HFpEF TTE showed G1DD on 01/2022, LVEF 70-75%.  Does appear to be volume overloaded, will consider IV furosemide when hypokalemia is improved. - Continue home torsemide 10 mg every other day - Monitor volume status, strict I/Os   T2DM Last A1c 4.6 on 2/5.  CBGs ranging 80-130 this hospitalization, did not require insulin. - Restarted metformin at discharge   OSA Reports inconsistent compliance with CPAP at home. - CPAP ordered inpatient, refused nightly - Follow up on compliance outpatient   HLD Lipid panel WNL with LDL of 78.   GERD - Continued pantoprazole 40 mg daily   Depression - Continued venlafaxine 150 mg BID   Hx CVA - Continued aspirin 81 mg   Tobacco use disorder Patient noted to intermittently use snuff. - Nicotine patches PRN provided inpatient   Obesity BMI 34.81 on admission.  Discharge Instructions  Discharge Instructions     Diet - low sodium heart healthy   Complete by: As directed    Increase activity slowly   Complete by: As directed       Allergies as of 05/26/2022   No Known Allergies      Medication List     TAKE these medications    ascorbic Acid 500 MG Cpcr Commonly known as: VITAMIN C Take 500 mg by mouth daily.   aspirin EC 81 MG tablet Take 81 mg by mouth daily. Swallow whole.   Blood Glucose Monitoring Suppl Devi 1 each by Does not apply route in the morning, at noon, and at bedtime. May substitute to any manufacturer covered by patient's insurance Dx E11.22   BLOOD GLUCOSE TEST STRIPS Strp Use to check blood sugar once daily before meals. May substitute to any manufacturer covered by patient's insurance E11.22   D3 VITAMIN PO Take by mouth daily. 5000 IU daily   dorzolamide 2 % ophthalmic  solution Commonly known as: TRUSOPT 1 drop 2 (two) times daily.   Fish Oil 1000 MG Caps Take by mouth daily.   fluticasone 50 MCG/ACT nasal spray Commonly known as: FLONASE Place 2 sprays into both nostrils daily.   lactulose 10 GM/15ML solution Commonly known as: CHRONULAC Take 30 mLs (20 g total) by mouth 3 (three) times daily.   Lancet Device Misc Use to check blood sugar once daily before meals. May substitute to any manufacturer covered by patient's insurance E11.22   Lancets Misc. Misc Use to check blood sugar once daily before meals. May substitute to any manufacturer covered by patient's insurance   metFORMIN 500 MG tablet Commonly known as: GLUCOPHAGE 1 qd What changed:  how much to take how to take this when to take this additional instructions   Milk Thistle 1000 MG Caps Take by mouth daily.   pantoprazole 40 MG tablet Commonly known as: PROTONIX Take 1 tablet (40 mg total) by mouth daily.   rifaximin 550 MG Tabs tablet Commonly known as: XIFAXAN Take 1 tablet (550 mg total) by mouth 2 (two) times daily.   sodium bicarbonate 650 MG tablet Take 650 mg by mouth 3 (three) times daily. Morning, evening, bedtime   spironolactone 25 MG tablet Commonly  known as: ALDACTONE Take 25 mg by mouth daily.   torsemide 10 MG tablet Commonly known as: DEMADEX Take 10 mg by mouth every other day. For two weeks   venlafaxine XR 150 MG 24 hr capsule Commonly known as: EFFEXOR-XR Take 1 capsule (150 mg total) by mouth 2 (two) times daily.   vitamin E 180 MG (400 UNITS) capsule Generic drug: vitamin E Take 400 Units by mouth daily.   zinc gluconate 50 MG tablet Take 50 mg by mouth daily.        Follow-up Information     Luking, Elayne Snare, MD. Schedule an appointment as soon as possible for a visit in 1 week(s).   Specialty: Family Medicine Contact information: [address] [place] [address] [phone]                No Known  Allergies  Consultations: Gastroenterology  Procedures/Studies: US Paracentesis  Result Date: 05/23/2022 INDICATION: Cirrhosis, ascites EXAM: ULTRASOUND GUIDED DIAGNOSTIC AND THERAPEUTIC PARACENTESIS MEDICATIONS: None. COMPLICATIONS: None immediate. PROCEDURE: Informed written consent was obtained from the patient after a discussion of the risks, benefits and alternatives to treatment. A timeout was performed prior to the initiation of the procedure. Initial ultrasound scanning demonstrates a large amount of ascites within the right lower abdominal quadrant. The right lower abdomen was prepped and draped in the usual sterile fashion. 1% lidocaine was used for local anesthesia. Following this, a 5 [place] Yueh catheter was introduced. An ultrasound image was saved for documentation purposes. The paracentesis was performed. The catheter was removed and a dressing was applied. The patient tolerated the procedure well without immediate post procedural complication. Patient received post-procedure intravenous albumin; see nursing notes for details. FINDINGS: A total of approximately 4 L of yellow ascitic fluid was removed. Samples were sent to the laboratory as requested by the clinical team. IMPRESSION: Successful ultrasound-guided paracentesis yielding 4 liters of peritoneal fluid. Electronically Signed   By: Lavonia Dana M.D.   On: 05/23/2022 11:03   US Paracentesis  Result Date: 05/19/2022 INDICATION: NASH Cirrhosis; recurrent ascites TIPS 11/23; Revision 05/09/22 EXAM: ULTRASOUND GUIDED RLQ PARACENTESIS MEDICATIONS: 10 cc 1% lidocaine. COMPLICATIONS: None immediate. PROCEDURE: Informed written consent was obtained from the patient after a discussion of the risks, benefits and alternatives to treatment. A timeout was performed prior to the initiation of the procedure. Initial ultrasound scanning demonstrates a large amount of ascites within the right lower abdominal quadrant. The right lower abdomen was  prepped and draped in the usual sterile fashion. 1% lidocaine was used for local anesthesia. Following this, a 19 gauge, 7-cm, Yueh catheter was introduced. An ultrasound image was saved for documentation purposes. The paracentesis was performed. The catheter was removed and a dressing was applied. The patient tolerated the procedure well without immediate post procedural complication. Patient received post-procedure intravenous albumin; see nursing notes for details. FINDINGS: A total of approximately 4 liters (max) of dark yellow fluid was removed. Samples were sent to the laboratory as requested by the clinical team. IMPRESSION: Successful ultrasound-guided paracentesis yielding 4 liters of peritoneal fluid. PLAN: IR following pt with GI; Paracentesis as needed Read by Lavonia Drafts [facility] Electronically Signed   By: Lavonia Dana M.D.   On: 05/19/2022 11:10   US Abdomen Limited RUQ (LIVER/GB)  Result Date: 05/17/2022 CLINICAL DATA:  Cirrhosis.  [place] screening. EXAM: ULTRASOUND ABDOMEN LIMITED RIGHT UPPER QUADRANT COMPARISON:  Ultrasound paracentesis 05/04/2022 FINDINGS: Gallbladder: Note is made of a 6 mm gallstone. No gallbladder  wall thickening or pericholecystic fluid. Negative sonographic Murphy's sign. Common bile duct: Diameter: 3.7 mm Liver: Cirrhotic morphology of the liver. No focal lesion. Portal vein is patent on color Doppler imaging with normal direction of blood flow towards the liver. Other: Tips stent is visualized. Moderate volume ascites. IMPRESSION: 1. Cirrhotic morphology of the liver. No focal lesion. 2. Cholelithiasis without secondary signs of acute cholecystitis. 3. Moderate volume ascites. Electronically Signed   By: Lovey Newcomer M.D.   On: 05/17/2022 10:01   IR TIPS REVISION MOD SED  Result Date: 05/09/2022 CLINICAL DATA:  67 year old male with history of NASH cirrhosis and recurrent ascites status post TIPS creation on 02/14/2022 presenting with persistent refractory ascites despite  prior intervention. EXAM: 1. Ultrasound-guided paracentesis 2. Portal venogram 3. Central and portal manometry 4. Balloon angioplasty of TIPS endograft MEDICATIONS: None. ANESTHESIA/SEDATION: Moderate (conscious) sedation was employed during this procedure. A total of Versed 1 mg and Fentanyl 50 mcg was administered intravenously. Moderate Sedation Time: 40 minutes. The patient's level of consciousness and vital signs were monitored continuously by radiology nursing throughout the procedure under my direct supervision. CONTRAST:  40 mL Omnipaque 300, intravenous FLUOROSCOPY TIME:  One hundred eighty-six mGy COMPLICATIONS: None immediate. PROCEDURE: Informed written consent was obtained from the patient after a thorough discussion of the procedural risks, benefits and alternatives. All questions were addressed. Maximal Sterile Barrier Technique was utilized including caps, mask, sterile gowns, sterile gloves, sterile drape, hand hygiene and skin antiseptic. A timeout was performed prior to the initiation of the procedure. Preprocedure ultrasound evaluation of the right lower quadrant demonstrate large volume ascites. Paracentesis was planned. Subdermal Local anesthesia was provided at the planned needle entry site. Deeper local anesthetic was administered under ultrasound guidance to the level of the peritoneum. Under direct ultrasound visualization, a 6 French Safe-T-Centesis catheter was directed into the peritoneum with immediate efflux of translucent, straw-colored fluid. A total of 9.6 L was drained throughout the procedure. Intravenous albumin was administered per protocol. Upon completion, the Safe-T-Centesis catheter was removed and a sterile bandage was applied. Preprocedure ultrasound evaluation demonstrated patency of the right internal jugular vein. Subdermal Local anesthesia was administered with 1% lidocaine at the planned needle entry site. A small skin nick was made. Under direct ultrasound  visualization, a 21 gauge micropuncture needle was directed into the right internal jugular vein. A permanent image was captured and stored the record. A micropuncture sheath was then introduced and exchanged over a Rosen wire for a 6 [place], 45 cm angled destination sheath. With the sheath tip positioned in the right atrium, central manometry was performed (mean 11 mm Hg). A 5 [place] MPA catheter was then inserted and directed into the indwelling TIPS endograft over a Rosen wire. The catheter was exchanged for a marking pigtail catheter. Portal manometry was performed (mean 29 mmHg, mean portosystemic gradient 18 mm Hg). Portal venogram was then performed which demonstrated patency of the indwelling stent. Given persistent poor hypertension, balloon molding was performed. Initially, an 8 mm x 8 cm Athletis balloon was inflated throughout the length of the stent. Central and portal manometry was then repeated after insertion of the marking pigtail catheter which demonstrated reduction in mean portosystemic gradient from 18 to 16 mm Hg (right atrium 13 mm Hg, portal vein 29 mm Hg). Therefore, additional balloon dilation with a 10 mm by 8 cm Athletis balloon was performed. Completion central and portal manometry was then performed which demonstrated further reduction of mean portosystemic gradient to 8 mmHg (right  atrium 12 mm Hg, portal vein 20 mm Hg). Completion portal venogram demonstrated widely patent TIPS endograft without evidence of competing varices. A Glidewire Advantage was inserted to straighten the pigtail catheter which was removed. The right IJ sheath was removed and hemostasis was achieved with brief manual compression. The patient tolerated procedure well was transferred to the recovery area in good condition. IMPRESSION: 1. Patent TIPS with persistent portal hypertension (mean portosystemic gradient of 18 mmHg). 2. Technically successful balloon molding to 10 mm with reduction of mean portosystemic  gradient from 18 to 8 mmHg. 3. Technically successful ultrasound-guided paracentesis yielding 9.6 L ascites. PLAN: PLAN Follow-up in Portal Hypertension Clinic in 1 month. Ruthann Cancer, MD [facility] Radiology Electronically Signed   By: Ruthann Cancer M.D.   On: 05/09/2022 16:02   IR Paracentesis  Result Date: 05/09/2022 CLINICAL DATA:  67 year old male with history of NASH cirrhosis and recurrent ascites status post TIPS creation on 02/14/2022 presenting with persistent refractory ascites despite prior intervention. EXAM: 1. Ultrasound-guided paracentesis 2. Portal venogram 3. Central and portal manometry 4. Balloon angioplasty of TIPS endograft MEDICATIONS: None. ANESTHESIA/SEDATION: Moderate (conscious) sedation was employed during this procedure. A total of Versed 1 mg and Fentanyl 50 mcg was administered intravenously. Moderate Sedation Time: 40 minutes. The patient's level of consciousness and vital signs were monitored continuously by radiology nursing throughout the procedure under my direct supervision. CONTRAST:  40 mL Omnipaque 300, intravenous FLUOROSCOPY TIME:  One hundred eighty-six mGy COMPLICATIONS: None immediate. PROCEDURE: Informed written consent was obtained from the patient after a thorough discussion of the procedural risks, benefits and alternatives. All questions were addressed. Maximal Sterile Barrier Technique was utilized including caps, mask, sterile gowns, sterile gloves, sterile drape, hand hygiene and skin antiseptic. A timeout was performed prior to the initiation of the procedure. Preprocedure ultrasound evaluation of the right lower quadrant demonstrate large volume ascites. Paracentesis was planned. Subdermal Local anesthesia was provided at the planned needle entry site. Deeper local anesthetic was administered under ultrasound guidance to the level of the peritoneum. Under direct ultrasound visualization, a 6 French  Safe-T-Centesis catheter was directed into the peritoneum with immediate efflux of translucent, straw-colored fluid. A total of 9.6 L was drained throughout the procedure. Intravenous albumin was administered per protocol. Upon completion, the Safe-T-Centesis catheter was removed and a sterile bandage was applied. Preprocedure ultrasound evaluation demonstrated patency of the right internal jugular vein. Subdermal Local anesthesia was administered with 1% lidocaine at the planned needle entry site. A small skin nick was made. Under direct ultrasound visualization, a 21 gauge micropuncture needle was directed into the right internal jugular vein. A permanent image was captured and stored the record. A micropuncture sheath was then introduced and exchanged over a Rosen wire for a 6 [place], 45 cm angled destination sheath. With the sheath tip positioned in the right atrium, central manometry was performed (mean 11 mm Hg). A 5 [place] MPA catheter was then inserted and directed into the indwelling TIPS endograft over a Rosen wire. The catheter was exchanged for a marking pigtail catheter. Portal manometry was performed (mean 29 mmHg, mean portosystemic gradient 18 mm Hg). Portal venogram was then performed which demonstrated patency of the indwelling stent. Given persistent poor hypertension, balloon molding was performed. Initially, an 8 mm x 8 cm Athletis balloon was inflated throughout the length of the stent. Central and portal manometry was then repeated after insertion of the marking pigtail catheter which demonstrated reduction in mean portosystemic gradient  from 18 to 16 mm Hg (right atrium 13 mm Hg, portal vein 29 mm Hg). Therefore, additional balloon dilation with a 10 mm by 8 cm Athletis balloon was performed. Completion central and portal manometry was then performed which demonstrated further reduction of mean portosystemic gradient to 8 mmHg (right atrium 12 mm Hg, portal vein 20 mm Hg). Completion portal  venogram demonstrated widely patent TIPS endograft without evidence of competing varices. A Glidewire Advantage was inserted to straighten the pigtail catheter which was removed. The right IJ sheath was removed and hemostasis was achieved with brief manual compression. The patient tolerated procedure well was transferred to the recovery area in good condition. IMPRESSION: 1. Patent TIPS with persistent portal hypertension (mean portosystemic gradient of 18 mmHg). 2. Technically successful balloon molding to 10 mm with reduction of mean portosystemic gradient from 18 to 8 mmHg. 3. Technically successful ultrasound-guided paracentesis yielding 9.6 L ascites. PLAN: PLAN Follow-up in Portal Hypertension Clinic in 1 month. Ruthann Cancer, MD [facility] Radiology Electronically Signed   By: Ruthann Cancer M.D.   On: 05/09/2022 16:02   IR US Guide Vasc Access Right  Result Date: 05/09/2022 CLINICAL DATA:  67 year old male with history of NASH cirrhosis and recurrent ascites status post TIPS creation on 02/14/2022 presenting with persistent refractory ascites despite prior intervention. EXAM: 1. Ultrasound-guided paracentesis 2. Portal venogram 3. Central and portal manometry 4. Balloon angioplasty of TIPS endograft MEDICATIONS: None. ANESTHESIA/SEDATION: Moderate (conscious) sedation was employed during this procedure. A total of Versed 1 mg and Fentanyl 50 mcg was administered intravenously. Moderate Sedation Time: 40 minutes. The patient's level of consciousness and vital signs were monitored continuously by radiology nursing throughout the procedure under my direct supervision. CONTRAST:  40 mL Omnipaque 300, intravenous FLUOROSCOPY TIME:  One hundred eighty-six mGy COMPLICATIONS: None immediate. PROCEDURE: Informed written consent was obtained from the patient after a thorough discussion of the procedural risks, benefits and alternatives. All questions were addressed.  Maximal Sterile Barrier Technique was utilized including caps, mask, sterile gowns, sterile gloves, sterile drape, hand hygiene and skin antiseptic. A timeout was performed prior to the initiation of the procedure. Preprocedure ultrasound evaluation of the right lower quadrant demonstrate large volume ascites. Paracentesis was planned. Subdermal Local anesthesia was provided at the planned needle entry site. Deeper local anesthetic was administered under ultrasound guidance to the level of the peritoneum. Under direct ultrasound visualization, a 6 French Safe-T-Centesis catheter was directed into the peritoneum with immediate efflux of translucent, straw-colored fluid. A total of 9.6 L was drained throughout the procedure. Intravenous albumin was administered per protocol. Upon completion, the Safe-T-Centesis catheter was removed and a sterile bandage was applied. Preprocedure ultrasound evaluation demonstrated patency of the right internal jugular vein. Subdermal Local anesthesia was administered with 1% lidocaine at the planned needle entry site. A small skin nick was made. Under direct ultrasound visualization, a 21 gauge micropuncture needle was directed into the right internal jugular vein. A permanent image was captured and stored the record. A micropuncture sheath was then introduced and exchanged over a Rosen wire for a 6 [place], 45 cm angled destination sheath. With the sheath tip positioned in the right atrium, central manometry was performed (mean 11 mm Hg). A 5 [place] MPA catheter was then inserted and directed into the indwelling TIPS endograft over a Rosen wire. The catheter was exchanged for a marking pigtail catheter. Portal manometry was performed (mean 29 mmHg, mean portosystemic gradient 18 mm Hg). Portal venogram was  then performed which demonstrated patency of the indwelling stent. Given persistent poor hypertension, balloon molding was performed. Initially, an 8 mm x 8 cm Athletis balloon was  inflated throughout the length of the stent. Central and portal manometry was then repeated after insertion of the marking pigtail catheter which demonstrated reduction in mean portosystemic gradient from 18 to 16 mm Hg (right atrium 13 mm Hg, portal vein 29 mm Hg). Therefore, additional balloon dilation with a 10 mm by 8 cm Athletis balloon was performed. Completion central and portal manometry was then performed which demonstrated further reduction of mean portosystemic gradient to 8 mmHg (right atrium 12 mm Hg, portal vein 20 mm Hg). Completion portal venogram demonstrated widely patent TIPS endograft without evidence of competing varices. A Glidewire Advantage was inserted to straighten the pigtail catheter which was removed. The right IJ sheath was removed and hemostasis was achieved with brief manual compression. The patient tolerated procedure well was transferred to the recovery area in good condition. IMPRESSION: 1. Patent TIPS with persistent portal hypertension (mean portosystemic gradient of 18 mmHg). 2. Technically successful balloon molding to 10 mm with reduction of mean portosystemic gradient from 18 to 8 mmHg. 3. Technically successful ultrasound-guided paracentesis yielding 9.6 L ascites. PLAN: PLAN Follow-up in Portal Hypertension Clinic in 1 month. Ruthann Cancer, MD [facility] Radiology Electronically Signed   By: Ruthann Cancer M.D.   On: 05/09/2022 16:02   US Paracentesis  Result Date: 05/04/2022 INDICATION: Recurrent ascites; cirrhosis EXAM: ULTRASOUND GUIDED RLQ PARACENTESIS MEDICATIONS: 10 cc 1% lidocaine COMPLICATIONS: None immediate. PROCEDURE: Informed written consent was obtained from the patient after a discussion of the risks, benefits and alternatives to treatment. A timeout was performed prior to the initiation of the procedure. Initial ultrasound scanning demonstrates a large amount of ascites within the right lower abdominal  quadrant. The right lower abdomen was prepped and draped in the usual sterile fashion. 1% lidocaine was used for local anesthesia. Following this, a 7 cm Yueh catheter was introduced. An ultrasound image was saved for documentation purposes. The paracentesis was performed. The catheter was removed and a dressing was applied. The patient tolerated the procedure well without immediate post procedural complication. Patient received post-procedure intravenous albumin; see nursing notes for details. FINDINGS: A total of approximately 4 liters (max) of yellow fluid was removed. Samples were sent to the laboratory as requested by the clinical team. IMPRESSION: Successful ultrasound-guided paracentesis yielding 4 liters of peritoneal fluid. TIPs procedure performed in IR 02/15/23 Read by Lavonia Drafts [facility] Electronically Signed   By: Lavonia Dana M.D.   On: 05/04/2022 14:20   US ABDOMINAL PELVIC ART/VENT FLOW DOPPLER  Result Date: 04/29/2022 CLINICAL DATA:  67 year old male with history of NASH cirrhosis and refractory ascites status post TIPS placement. EXAM: DUPLEX ULTRASOUND OF LIVER AND TIPS SHUNT TECHNIQUE: Color and duplex Doppler ultrasound was performed to evaluate the hepatic in-flow and out-flow vessels. COMPARISON:  02/14/2022, 10/15/2021 FINDINGS: Portal Vein Velocities Main:  65 cm/sec, antegrade Right:  114 cm/sec, antegrade Left:  18 cm/sec, retrograde TIPS Stent Velocities Portal: 114 cm/sec Mid:  127 Central: 149 cm/sec IVC: Present and patent with normal respiratory phasicity. Hepatic Vein Velocities Right:  55 cm/sec Mid:  149 cm/sec Left:  42 cm/sec Splenic Vein: Patent. Superior Mesenteric Vein: Not visualized. Hepatic Artery: 81 centimeters/second Ascities: Present Varices: Absent Other findings: Similar cirrhotic appearance of the liver. IMPRESSION: Patent indwelling TIPS endograft. Ruthann Cancer, MD [facility] Radiology  Electronically Signed   By:  Ruthann Cancer M.D.   On: 04/29/2022 12:41     Discharge Exam: Vitals:   05/25/22 2104 05/26/22 0302  BP: 125/81 118/76  Pulse: 95 84  Resp: 19 16  Temp: 99.5 F (37.5 C) 98.2 F (36.8 C)  SpO2: 99% 100%   Vitals:   05/25/22 0746 05/25/22 1300 05/25/22 2104 05/26/22 0302  BP: 112/86 131/78 125/81 118/76  Pulse: 86 94 95 84  Resp: 14 17 19 16   Temp: 98.9 F (37.2 C) 98.6 F (37 C) 99.5 F (37.5 C) 98.2 F (36.8 C)  TempSrc: Oral Oral Oral   SpO2: 99% 100% 99% 100%  Weight:      Height:        General: Elderly man with distended abdomen, tired appearing resting in chair in no acute distress. HEENT: No scleral icterus.  Moist mucous membranes.  NCAT. Lungs: CTAB.  Normal work of breathing on room air. Cardiovascular: RRR.  Normal S1/S2.  No murmurs/rubs/gallops.  No JVD. Abdomen: Moderately distended abdomen with shifting dullness.  Nontender.  No rebound or guarding. Extremities: 2+ peripheral edema bilaterally.  Capillary refill less than 2 seconds.  No cyanosis or clubbing. Skin: Mildly jaundiced, no rashes or lesions grossly.  Scattered purpura on bilateral arms. Neuro: Alert and oriented x4.  No asterixis.  No focal neurological deficit.  Some psychomotor slowing.   The results of significant diagnostics from this hospitalization (including imaging, microbiology, ancillary and laboratory) are listed below for reference.    Microbiology: Recent Results (from the past 240 hour(s))  Gram stain     Status: None   Collection Time: 05/19/22  8:15 AM   Specimen: Ascitic  Result Value Ref Range Status   Specimen Description ASCITIC  Final   Special Requests NONE  Final   Gram Stain   Final    NO ORGANISMS SEEN WBC PRESENT,BOTH PMN AND MONONUCLEAR CYTOSPIN SMEAR Performed at [facility], [address] [place], [place] [address]    Report Status 05/19/2022 FINAL  Final  Culture, body fluid w Gram Stain-bottle     Status: None   Collection Time: 05/19/22  8:15 AM    Specimen: Ascitic  Result Value Ref Range Status   Specimen Description ASCITIC  Final   Special Requests BAA 10CC  Final   Culture   Final    NO GROWTH 5 DAYS Performed at [facility], [address] [place], [place] [address]    Report Status 05/24/2022 FINAL  Final  Culture, body fluid w Gram Stain-bottle     Status: Abnormal (Preliminary result)   Collection Time: 05/23/22  8:20 AM   Specimen: Ascitic  Result Value Ref Range Status   Specimen Description   Final    ASCITIC Performed at [facility], [address], [place], [place] [address]    Special Requests   Final    BOTTLES DRAWN AEROBIC AND ANAEROBIC Blood Culture adequate volume Performed at [facility], [address] [place], [place] [address]    Gram Stain   Final    GRAM POSITIVE COCCI ANAEROBIC BOTTLE ONLY Gram Stain Report Called to,Read Back By and Verified With: DR. Cherlynn June ON [id number] AT [place]. Performed at [facility], [address], [place], [place] [address]    Culture (A)  Final    STREPTOCOCCUS SALIVARIUS CULTURE REINCUBATED FOR BETTER GROWTH Performed at [place] [facility] [address] [place], [place] [address]    Report Status PENDING  Incomplete  Gram stain     Status: None   Collection Time: 05/23/22  8:20 AM   Specimen: Ascitic  Result Value Ref Range Status   Specimen Description ASCITIC  Final   Special Requests NONE  Final   Gram Stain   Final    WBC PRESENT,BOTH PMN AND MONONUCLEAR NO ORGANISMS SEEN CYTOSPIN SMEAR Performed at [facility], [address] [place], [place] [address]    Report Status 05/23/2022 FINAL  Final  Blood culture (routine x 2)     Status: None (Preliminary result)   Collection Time: 05/24/22 12:58 PM   Specimen: BLOOD  Result Value Ref Range Status   Specimen Description BLOOD BLOOD LEFT ARM  Final   Special Requests   Final    BOTTLES DRAWN AEROBIC ONLY Blood Culture adequate volume   Culture   Final    NO GROWTH 2 DAYS Performed at  [facility], [address] [place], [place] [address]    Report Status PENDING  Incomplete  Blood culture (routine x 2)     Status: None (Preliminary result)   Collection Time: 05/24/22  1:10 PM   Specimen: BLOOD  Result Value Ref Range Status   Specimen Description BLOOD BLOOD RIGHT ARM  Final   Special Requests   Final    BOTTLES DRAWN AEROBIC AND ANAEROBIC Blood Culture results may not be optimal due to an excessive volume of blood received in culture bottles   Culture   Final    NO GROWTH 2 DAYS Performed at [facility], [address] [place], [place], [place] [address]    Report Status PENDING  Incomplete     Labs: BNP (last 3 results) No results for input(s): "BNP" in the last 8760 hours. Basic Metabolic Panel: Recent Labs  Lab 05/24/22 1127 05/25/22 0447 05/26/22 0438  NA 139 141 142  K 3.6 2.9* 3.6  CL 108 109 110  CO2 25 24 26   GLUCOSE 114* 121* 121*  BUN 19 20 21   CREATININE 1.38* 1.24 1.29*  CALCIUM 8.4* 8.4* 8.7*  MG  --  2.1  --    Liver Function Tests: Recent Labs  Lab 05/24/22 1127 05/25/22 0447 05/26/22 0438  AST 57* 44* 47*  ALT 31 24 24   ALKPHOS 144* 112 105  BILITOT 1.7* 1.9* 1.9*  PROT 5.3* 5.3* 5.5*  ALBUMIN 2.3* 3.0* 3.1*   No results for input(s): "LIPASE", "AMYLASE" in the last 168 hours. No results for input(s): "AMMONIA" in the last 168 hours. CBC: Recent Labs  Lab 05/24/22 1127 05/25/22 0447 05/25/22 1042 05/26/22 0438  WBC 5.8 4.4  --  5.2  NEUTROABS 3.7  --   --   --   HGB 10.0* 8.4* 8.7* 8.7*  HCT 28.8* 25.0* 26.4* 25.9*  MCV 96.0 96.2  --  97.7  PLT 121* 110*  --  128*   Cardiac Enzymes: No results for input(s): "CKTOTAL", "CKMB", "CKMBINDEX", "TROPONINI" in the last 168 hours. BNP: Invalid input(s): "POCBNP" CBG: Recent Labs  Lab 05/25/22 1634 05/25/22 2107 05/26/22 0303 05/26/22 0734 05/26/22 1152  GLUCAP 99 101* 122* 92 118*   D-Dimer No results for input(s): "DDIMER" in the last 72 hours. Hgb A1c No  results for input(s): "HGBA1C" in the last 72 hours. Lipid Profile Recent Labs    05/25/22 0447  CHOL 133  HDL 33*  LDLCALC 78  TRIG 111  CHOLHDL 4.0   Thyroid function studies No results for input(s): "TSH", "T4TOTAL", "T3FREE", "THYROIDAB" in the last  72 hours.  Invalid input(s): "FREET3" Anemia work up No results for input(s): "VITAMINB12", "FOLATE", "FERRITIN", "TIBC", "IRON", "RETICCTPCT" in the last 72 hours. Urinalysis    Component Value Date/Time   Benjamin Stain trace (5) (A) 04/19/2022 1453   Sepsis Labs Recent Labs  Lab 05/24/22 1127 05/25/22 0447 05/26/22 0438  WBC 5.8 4.4 5.2   Microbiology Recent Results (from the past 240 hour(s))  Gram stain     Status: None   Collection Time: 05/19/22  8:15 AM   Specimen: Ascitic  Result Value Ref Range Status   Specimen Description ASCITIC  Final   Special Requests NONE  Final   Gram Stain   Final    NO ORGANISMS SEEN WBC PRESENT,BOTH PMN AND MONONUCLEAR CYTOSPIN SMEAR Performed at [facility], [address] [place], [place] [address]    Report Status 05/19/2022 FINAL  Final  Culture, body fluid w Gram Stain-bottle     Status: None   Collection Time: 05/19/22  8:15 AM   Specimen: Ascitic  Result Value Ref Range Status   Specimen Description ASCITIC  Final   Special Requests BAA 10CC  Final   Culture   Final    NO GROWTH 5 DAYS Performed at [facility], [address], [place], [place] [address]    Report Status 05/24/2022 FINAL  Final  Culture, body fluid w Gram Stain-bottle     Status: Abnormal (Preliminary result)   Collection Time: 05/23/22  8:20 AM   Specimen: Ascitic  Result Value Ref Range Status   Specimen Description   Final    ASCITIC Performed at [facility], [address], [place], [place] [address]    Special Requests   Final    BOTTLES DRAWN AEROBIC AND ANAEROBIC Blood Culture adequate volume Performed at [facility], [address] [place], [place] [address]    Gram Stain   Final     GRAM POSITIVE COCCI ANAEROBIC BOTTLE ONLY Gram Stain Report Called to,Read Back By and Verified With: DR. Cherlynn June ON [id number] AT [place]. Performed at [facility], [address] [place], [place] [address]    Culture (A)  Final    STREPTOCOCCUS SALIVARIUS CULTURE REINCUBATED FOR BETTER GROWTH Performed at [facility] [address] [place], [place] [address]    Report Status PENDING  Incomplete  Gram stain     Status: None   Collection Time: 05/23/22  8:20 AM   Specimen: Ascitic  Result Value Ref Range Status   Specimen Description ASCITIC  Final   Special Requests NONE  Final   Gram Stain   Final    WBC PRESENT,BOTH PMN AND MONONUCLEAR NO ORGANISMS SEEN CYTOSPIN SMEAR Performed at [facility], [address] [place] [address] [place], [place] [address]    Report Status 05/23/2022 FINAL  Final  Blood culture (routine x 2)     Status: None (Preliminary result)   Collection Time: 05/24/22 12:58 PM   Specimen: BLOOD  Result Value Ref Range Status   Specimen Description BLOOD BLOOD LEFT ARM  Final   Special Requests   Final    BOTTLES DRAWN AEROBIC ONLY Blood Culture adequate volume   Culture   Final    NO GROWTH 2 DAYS Performed at [facility], [address] [place], [place] [address]    Report Status PENDING  Incomplete  Blood culture (routine x 2)     Status: None (Preliminary result)   Collection Time: 05/24/22  1:10 PM   Specimen: BLOOD  Result  Value Ref Range Status   Specimen Description BLOOD BLOOD RIGHT ARM  Final   Special Requests   Final    BOTTLES DRAWN AEROBIC AND ANAEROBIC Blood Culture results may not be optimal due to an excessive volume of blood received in culture bottles   Culture   Final    NO GROWTH 2 DAYS Performed at [facility], [address] [place], [place] [address]    Report Status PENDING  Incomplete     Time coordinating discharge: 35 minutes  SIGNED:   Selyna Klahn Katy Fitch, MS4 [facility] 05/26/2022, 12:57  PM  If 7PM-7AM, please contact night-coverage [email or web address]

## 2022-05-26 NOTE — Progress Notes (Signed)
Patient up in recliner during this shift. No complaints during this shift.

## 2022-05-26 NOTE — Progress Notes (Signed)
Patient tolerated right sided paracentesis procedure well today and 4 Liters of ascites removed and fluids sent to lab for processing. Patient verbalized understanding of post procedure instructions and transported back to inpatient bed assignment at this time with no acute distress noted. Report given to [place], Therapist, sports.

## 2022-05-26 NOTE — Progress Notes (Signed)
Ascitic fluid cell count today with 7PMNs, negative gram stain. No signs of SBP. Will follow up pending cultures. [place] for discharge from GI standpoint. No need for SBP prophylaxis.  Laureen Ochs. [facility] [phone] 3/21/20244:21 PM

## 2022-05-26 NOTE — Telephone Encounter (Signed)
Please arrange hospital follow up with Aliene Altes, PA-C in 2-3 weeks.

## 2022-05-26 NOTE — Progress Notes (Deleted)
PROGRESS NOTE    Shawn [name].  [id number] DOB: [birth year] DOA: 05/24/2022 PCP: [name], MD  Brief Narrative:  Shawn [name]. is a 67 y.o. male with medical history significant for cirrhosis, ascites requiring weekly paracentesis, T2DM, HTN, OSA, and depression presenting to ED on 3/19 for admission per outpatient gastroenterologist office.  On 3/18, patient received R-sided paracentesis with 4 L ascites removed and ascitic fluid analysis showed G+ cocci.  Patient was called and instructed to present to ED urgently for IV antibiotics for possible SBP.  Patient was started on 2 g CTX daily and blood cultures were drawn in ED.  Labs significant for Cr 1.38, AST 57/ALT 31, Tbili 1.7, and anemia of 10.0 Hgb as well as thrombocytopenia of 121.  PT elevated to 18.3 and INR 1.5.  Hospitalist service consulted for admission and IV antibiotics for SBP.  Currently, there is a question whether G+ cocci are a contaminant, but antibiotics continuing pending result of ascites cultures.  Problem List:   Principal Problem:   SBP (spontaneous bacterial peritonitis) ([place]) Active Problems:   Essential hypertension, benign   Type 2 diabetes mellitus with stage 3b chronic kidney disease, without long-term current use of insulin (HCC)   Hyperlipidemia associated with type 2 diabetes mellitus ([place])   Obstructive sleep apnea   Fatty liver   Elevated LFTs   Cirrhosis, non-alcoholic (HCC)   Cognitive dysfunction   Morbid obesity (HCC)   GERD (gastroesophageal reflux disease)   Ascites   GAD (generalized anxiety disorder)   Cirrhosis of liver with ascites (HCC)   S/P TIPS (transjugular intrahepatic portosystemic shunt)   Thrombocytopenia (HCC)   Anemia   Positive culture finding  Assessment and Plan:  SBP; gram positive cocci seen in ascitic fluid The G+ cocci may be contaminant as they grew in only one anaerobic bottle, but SBP has high mortality and will treat pending further  speciation of ascitic fluid cultures. - CTX 1 g daily, day 3 of 5 - f/u ascites cultures, currently pending   Cirrhosis, fatty liver Portal hypertension s/p TIPS Ascites Does not currently appear encephalopathic and is likely near baseline mental status.  Last paracentesis 3/18 drew off 4 L. - Continue home lactulose 20 g TID - Monitor volume status, mental status closely - GI consulted, appreciate recs  - IV albumin today   Thrombocytopenia, worsening Elevated PT/INR, increasing Likely secondary to hepatic dysfunction.  Will use caution with anticoagulation and monitor closely.  Has had extensive workup by Heme/Onc on 3/13, negative ANA, RF, hep B/C serology.  Low platelet production. - Recheck PT/INR tomorrow morning - Follow up outpatient with Hematologist   Chronic anemia, worse from admission Hgb trend 10.4 > 8.4 today, but no signs of active bleeding and stable on recheck at 11 am.  As noted above, patient seen by Heme/Onc for workup.  No signs of hemolysis or malignancy.  Folate, MMA normal.  Negative FOBT.  Likely CKD or anemia of chronic disease.  Does have low serum copper, which could be contributing. - Follow up outpatient with Hematologist - Trend H&H daily   Hypokalemia K 2.9 this morning, likely in setting of diuresis and missing a day of spironolactone yesterday, now resumed. - Goal K>3.5, monitor daily and replete as necessary - Continue spironolactone/torsemide combo   HFpEF TTE showed G1DD on 01/2022, LVEF 70-75%.  Does appear to be volume overloaded, will consider IV furosemide when hypokalemia is improved. - Continue home torsemide 10 mg every other  day - Monitor volume status, strict I/Os   T2DM CBGs ranging 80-130, not requiring insulin at this time. - Held metformin in setting of hospitalization - vsSSI and CBGs 5 times daily   OSA Reports inconsistent compliance with CPAP at home. - CPAP nightly, has been refusing   HLD - Lipid panel ordered    GERD - Continue pantoprazole 40 mg daily   Depression - Continue home venlafaxine 150 mg BID   Hx CVA - Continue aspirin 81 mg   Tobacco use disorder Patient noted to intermittently use snuff. - Nicotine patches PRN ordered   Obesity BMI 34.81 on admission.   DVT prophylaxis: SCDs, medical prophylaxis held in setting of elevated INR (HAS-BLED score of 6)  Code Status: Full Family Communication: Wife updated at bedside 3/19 Disposition Plan: Patient is from: Home Anticipated d/c is to: Home Anticipated d/c date is: 3/23, earlier if ascites fluid cultures negative Patient currently: Pending ascites fluid cultures and SBP treatment Admission Status is: Inpatient Remains inpatient appropriate because: IV antibiotics required for possible SBP treatment   Consultants:  Gastroenterology   Procedures:  None   Antimicrobials:  CTX 2g  3/19>> (5 days planned)  Subjective: Patient seen and evaluated today with no new acute complaints or concerns. No acute concerns or events noted overnight.  Feels well and is ready to go home once medically cleared.  Objective: Vitals:   05/25/22 0746 05/25/22 1300 05/25/22 2104 05/26/22 0302  BP: 112/86 131/78 125/81 118/76  Pulse: 86 94 95 84  Resp: 14 17 19 16   Temp: 98.9 F (37.2 C) 98.6 F (37 C) 99.5 F (37.5 C) 98.2 F (36.8 C)  TempSrc: Oral Oral Oral   SpO2: 99% 100% 99% 100%  Weight:      Height:        Intake/Output Summary (Last 24 hours) at 05/26/2022 0846 Last data filed at 05/26/2022 [id number] Gross per 24 hour  Intake 680 ml  Output 500 ml  Net 180 ml   Filed Weights   05/24/22 1016 05/24/22 1359  Weight: 94.3 kg 96.9 kg    Examination: General: Elderly man with distended abdomen, tired appearing resting in chair in no acute distress. HEENT: No scleral icterus.  Moist mucous membranes.  NCAT. Lungs: CTAB.  Normal work of breathing on room air. Cardiovascular: RRR.  Normal S1/S2.  No murmurs/rubs/gallops.  No  JVD. Abdomen: Moderately distended abdomen with shifting dullness.  Nontender.  No rebound or guarding. Extremities: 2+ peripheral edema bilaterally.  Capillary refill less than 2 seconds.  No cyanosis or clubbing. Skin: Mildly jaundiced, no rashes or lesions grossly.  Scattered purpura on bilateral arms. Neuro: Alert and oriented x4.  No asterixis.  No focal neurological deficit.  Some slowing of speech.  Data Reviewed: I have personally reviewed following labs and imaging studies.  CBC: Recent Labs  Lab 05/24/22 1127 05/25/22 0447 05/25/22 1042 05/26/22 0438  WBC 5.8 4.4  --  5.2  NEUTROABS 3.7  --   --   --   HGB 10.0* 8.4* 8.7* 8.7*  HCT 28.8* 25.0* 26.4* 25.9*  MCV 96.0 96.2  --  97.7  PLT 121* 110*  --  0000000*   Basic Metabolic Panel: Recent Labs  Lab 05/24/22 1127 05/25/22 0447 05/26/22 0438  NA 139 141 142  K 3.6 2.9* 3.6  CL 108 109 110  CO2 25 24 26   GLUCOSE 114* 121* 121*  BUN 19 20 21   CREATININE 1.38* 1.24 1.29*  CALCIUM 8.4* 8.4*  8.7*  MG  --  2.1  --    GFR: Estimated Creatinine Clearance: 61.1 mL/min (A) (by C-G formula based on SCr of 1.29 mg/dL (H)). Liver Function Tests: Recent Labs  Lab 05/24/22 1127 05/25/22 0447 05/26/22 0438  AST 57* 44* 47*  ALT 31 24 24   ALKPHOS 144* 112 105  BILITOT 1.7* 1.9* 1.9*  PROT 5.3* 5.3* 5.5*  ALBUMIN 2.3* 3.0* 3.1*   No results for input(s): "LIPASE", "AMYLASE" in the last 168 hours. No results for input(s): "AMMONIA" in the last 168 hours. Coagulation Profile: Recent Labs  Lab 05/24/22 1127 05/25/22 0447 05/26/22 0438  INR 1.5* 1.8* 1.7*   Cardiac Enzymes: No results for input(s): "CKTOTAL", "CKMB", "CKMBINDEX", "TROPONINI" in the last 168 hours. BNP (last 3 results) No results for input(s): "PROBNP" in the last 8760 hours. HbA1C: No results for input(s): "HGBA1C" in the last 72 hours. CBG: Recent Labs  Lab 05/25/22 1109 05/25/22 1634 05/25/22 2107 05/26/22 0303 05/26/22 0734  GLUCAP 125*  99 101* 122* 92   Lipid Profile: Recent Labs    05/25/22 0447  CHOL 133  HDL 33*  LDLCALC 78  TRIG 111  CHOLHDL 4.0   Thyroid Function Tests: No results for input(s): "TSH", "T4TOTAL", "FREET4", "T3FREE", "THYROIDAB" in the last 72 hours. Anemia Panel: No results for input(s): "VITAMINB12", "FOLATE", "FERRITIN", "TIBC", "IRON", "RETICCTPCT" in the last 72 hours. Sepsis Labs: No results for input(s): "PROCALCITON", "LATICACIDVEN" in the last 168 hours.  Recent Results (from the past 240 hour(s))  Gram stain     Status: None   Collection Time: 05/19/22  8:15 AM   Specimen: Ascitic  Result Value Ref Range Status   Specimen Description ASCITIC  Final   Special Requests NONE  Final   Gram Stain   Final    NO ORGANISMS SEEN WBC PRESENT,BOTH PMN AND MONONUCLEAR CYTOSPIN SMEAR Performed at [facility], [address] [place], [place] [address]    Report Status 05/19/2022 FINAL  Final  Culture, body fluid w Gram Stain-bottle     Status: None   Collection Time: 05/19/22  8:15 AM   Specimen: Ascitic  Result Value Ref Range Status   Specimen Description ASCITIC  Final   Special Requests BAA 10CC  Final   Culture   Final    NO GROWTH 5 DAYS Performed at [facility], [address] [place], [place] [address]    Report Status 05/24/2022 FINAL  Final  Culture, body fluid w Gram Stain-bottle     Status: None (Preliminary result)   Collection Time: 05/23/22  8:20 AM   Specimen: Ascitic  Result Value Ref Range Status   Specimen Description   Final    ASCITIC Performed at [facility], [address] [place], [place] [address]    Special Requests   Final    BOTTLES DRAWN AEROBIC AND ANAEROBIC Blood Culture adequate volume Performed at [facility], [address] [place], [place] [address]    Gram Stain   Final    GRAM POSITIVE COCCI ANAEROBIC BOTTLE ONLY Gram Stain Report Called to,Read Back By and Verified With: DR. Cherlynn June ON [id number] AT [place]. Performed at  [facility], [address] [place], [place] [address]    Culture   Final    CULTURE REINCUBATED FOR BETTER GROWTH Performed at [facility], [place] [address], [place], [place] [address]    Report Status PENDING  Incomplete  Gram stain     Status: None  Collection Time: 05/23/22  8:20 AM   Specimen: Ascitic  Result Value Ref Range Status   Specimen Description ASCITIC  Final   Special Requests NONE  Final   Gram Stain   Final    WBC PRESENT,BOTH PMN AND MONONUCLEAR NO ORGANISMS SEEN CYTOSPIN SMEAR Performed at [facility], [address] [place], [place] [address]    Report Status 05/23/2022 FINAL  Final  Blood culture (routine x 2)     Status: None (Preliminary result)   Collection Time: 05/24/22 12:58 PM   Specimen: BLOOD  Result Value Ref Range Status   Specimen Description BLOOD BLOOD LEFT ARM  Final   Special Requests   Final    BOTTLES DRAWN AEROBIC ONLY Blood Culture adequate volume   Culture   Final    NO GROWTH 2 DAYS Performed at [facility], [address] [place], [place] [address]    Report Status PENDING  Incomplete  Blood culture (routine x 2)     Status: None (Preliminary result)   Collection Time: 05/24/22  1:10 PM   Specimen: BLOOD  Result Value Ref Range Status   Specimen Description BLOOD BLOOD RIGHT ARM  Final   Special Requests   Final    BOTTLES DRAWN AEROBIC AND ANAEROBIC Blood Culture results may not be optimal due to an excessive volume of blood received in culture bottles   Culture   Final    NO GROWTH 2 DAYS Performed at [facility], [address], [place], [place] [address]    Report Status PENDING  Incomplete     Radiology Studies: No results found.  Scheduled Meds:  aspirin EC  81 mg Oral Daily   dorzolamide  1 drop Both Eyes BID   fluticasone  2 spray Each Nare Daily   insulin aspart  0-6 Units Subcutaneous TID WC   lactulose  20 g Oral TID   multivitamin with minerals  1 tablet Oral Daily   pantoprazole  40 mg Oral  Daily   sodium bicarbonate  650 mg Oral TID   spironolactone  25 mg Oral Daily   torsemide  10 mg Oral QODAY   venlafaxine XR  150 mg Oral BID   Continuous Infusions:  cefTRIAXone (ROCEPHIN)  IV 2 g (05/25/22 1449)     LOS: 2 days   Time spent: 35 minutes  Shanyn Preisler, MS4 [facility]  If 7PM-7AM, please contact night-coverage [email or web address] 05/26/2022, 8:46 AM

## 2022-05-26 NOTE — Progress Notes (Signed)
Gastroenterology Progress Note   Referring Provider: No ref. provider found Primary Care Physician:  [name], MD Primary Gastroenterologist:  Garfield Cornea, MD   Patient ID: Hogan Funaro.; [id number]; Jun 14, 1955   Subjective:    Feels fine. No abdominal pain. He really wants to go home. He also requests another paracentesis prior to discharge.   Objective:   Vital signs in last 24 hours: Temp:  [98.2 F (36.8 C)-99.5 F (37.5 C)] 98.2 F (36.8 C) (03/21 0302) Pulse Rate:  [84-95] 84 (03/21 0302) Resp:  [16-19] 16 (03/21 0302) BP: (118-131)/(76-81) 118/76 (03/21 0302) SpO2:  [99 %-100 %] 100 % (03/21 0302) Last BM Date : 05/25/22 General:   Alert,  Well-developed, well-nourished, pleasant and cooperative in NAD Head:  Normocephalic and atraumatic. Eyes:  Sclera clear, no icterus.   Abdomen:  Soft, nontender and nondistended. Normal bowel sounds, without guarding, and without rebound.   Extremities:  Without clubbing, deformity. 1-2+ pitting edema. Neurologic:  Alert and  oriented x4;  grossly normal neurologically. Skin:  Intact without significant lesions or rashes. Psych:  Alert and cooperative. Normal mood and affect.  Intake/Output from previous day: 03/20 0701 - 03/21 0700 In: 680 [P.O.:580; IV Piggyback:100] Out: 500 [Urine:500] Intake/Output this shift: No intake/output data recorded.  Lab Results: CBC Recent Labs    05/24/22 1127 05/25/22 0447 05/25/22 1042 05/26/22 0438  WBC 5.8 4.4  --  5.2  HGB 10.0* 8.4* 8.7* 8.7*  HCT 28.8* 25.0* 26.4* 25.9*  MCV 96.0 96.2  --  97.7  PLT 121* 110*  --  128*   BMET Recent Labs    05/24/22 1127 05/25/22 0447 05/26/22 0438  NA 139 141 142  K 3.6 2.9* 3.6  CL 108 109 110  CO2 25 24 26   GLUCOSE 114* 121* 121*  BUN 19 20 21   CREATININE 1.38* 1.24 1.29*  CALCIUM 8.4* 8.4* 8.7*   LFTs Recent Labs    05/24/22 1127 05/25/22 0447 05/26/22 0438  BILITOT 1.7* 1.9* 1.9*  ALKPHOS 144* 112 105   AST 57* 44* 47*  ALT 31 24 24   PROT 5.3* 5.3* 5.5*  ALBUMIN 2.3* 3.0* 3.1*   No results for input(s): "LIPASE" in the last 72 hours. PT/INR Recent Labs    05/24/22 1127 05/25/22 0447 05/26/22 0438  LABPROT 18.3* 20.8* 19.5*  INR 1.5* 1.8* 1.7*         Imaging Studies: US Paracentesis  Result Date: 05/23/2022 INDICATION: Cirrhosis, ascites EXAM: ULTRASOUND GUIDED DIAGNOSTIC AND THERAPEUTIC PARACENTESIS MEDICATIONS: None. COMPLICATIONS: None immediate. PROCEDURE: Informed written consent was obtained from the patient after a discussion of the risks, benefits and alternatives to treatment. A timeout was performed prior to the initiation of the procedure. Initial ultrasound scanning demonstrates a large amount of ascites within the right lower abdominal quadrant. The right lower abdomen was prepped and draped in the usual sterile fashion. 1% lidocaine was used for local anesthesia. Following this, a 5 [place] Yueh catheter was introduced. An ultrasound image was saved for documentation purposes. The paracentesis was performed. The catheter was removed and a dressing was applied. The patient tolerated the procedure well without immediate post procedural complication. Patient received post-procedure intravenous albumin; see nursing notes for details. FINDINGS: A total of approximately 4 L of yellow ascitic fluid was removed. Samples were sent to the laboratory as requested by the clinical team. IMPRESSION: Successful ultrasound-guided paracentesis yielding 4 liters of peritoneal fluid. Electronically Signed   By: Crist Infante.D.  On: 05/23/2022 11:03   US Paracentesis  Result Date: 05/19/2022 INDICATION: NASH Cirrhosis; recurrent ascites TIPS 11/23; Revision 05/09/22 EXAM: ULTRASOUND GUIDED RLQ PARACENTESIS MEDICATIONS: 10 cc 1% lidocaine. COMPLICATIONS: None immediate. PROCEDURE: Informed written consent was obtained from the patient after a discussion of the risks, benefits and alternatives to  treatment. A timeout was performed prior to the initiation of the procedure. Initial ultrasound scanning demonstrates a large amount of ascites within the right lower abdominal quadrant. The right lower abdomen was prepped and draped in the usual sterile fashion. 1% lidocaine was used for local anesthesia. Following this, a 19 gauge, 7-cm, Yueh catheter was introduced. An ultrasound image was saved for documentation purposes. The paracentesis was performed. The catheter was removed and a dressing was applied. The patient tolerated the procedure well without immediate post procedural complication. Patient received post-procedure intravenous albumin; see nursing notes for details. FINDINGS: A total of approximately 4 liters (max) of dark yellow fluid was removed. Samples were sent to the laboratory as requested by the clinical team. IMPRESSION: Successful ultrasound-guided paracentesis yielding 4 liters of peritoneal fluid. PLAN: IR following pt with GI; Paracentesis as needed Read by Lavonia Drafts [facility] Electronically Signed   By: Lavonia Dana M.D.   On: 05/19/2022 11:10   US Abdomen Limited RUQ (LIVER/GB)  Result Date: 05/17/2022 CLINICAL DATA:  Cirrhosis.  [place] screening. EXAM: ULTRASOUND ABDOMEN LIMITED RIGHT UPPER QUADRANT COMPARISON:  Ultrasound paracentesis 05/04/2022 FINDINGS: Gallbladder: Note is made of a 6 mm gallstone. No gallbladder wall thickening or pericholecystic fluid. Negative sonographic Murphy's sign. Common bile duct: Diameter: 3.7 mm Liver: Cirrhotic morphology of the liver. No focal lesion. Portal vein is patent on color Doppler imaging with normal direction of blood flow towards the liver. Other: Tips stent is visualized. Moderate volume ascites. IMPRESSION: 1. Cirrhotic morphology of the liver. No focal lesion. 2. Cholelithiasis without secondary signs of acute cholecystitis. 3. Moderate volume ascites. Electronically Signed   By: Lovey Newcomer M.D.   On: 05/17/2022 10:01   IR TIPS REVISION  MOD SED  Result Date: 05/09/2022 CLINICAL DATA:  67 year old male with history of NASH cirrhosis and recurrent ascites status post TIPS creation on 02/14/2022 presenting with persistent refractory ascites despite prior intervention. EXAM: 1. Ultrasound-guided paracentesis 2. Portal venogram 3. Central and portal manometry 4. Balloon angioplasty of TIPS endograft MEDICATIONS: None. ANESTHESIA/SEDATION: Moderate (conscious) sedation was employed during this procedure. A total of Versed 1 mg and Fentanyl 50 mcg was administered intravenously. Moderate Sedation Time: 40 minutes. The patient's level of consciousness and vital signs were monitored continuously by radiology nursing throughout the procedure under my direct supervision. CONTRAST:  40 mL Omnipaque 300, intravenous FLUOROSCOPY TIME:  One hundred eighty-six mGy COMPLICATIONS: None immediate. PROCEDURE: Informed written consent was obtained from the patient after a thorough discussion of the procedural risks, benefits and alternatives. All questions were addressed. Maximal Sterile Barrier Technique was utilized including caps, mask, sterile gowns, sterile gloves, sterile drape, hand hygiene and skin antiseptic. A timeout was performed prior to the initiation of the procedure. Preprocedure ultrasound evaluation of the right lower quadrant demonstrate large volume ascites. Paracentesis was planned. Subdermal Local anesthesia was provided at the planned needle entry site. Deeper local anesthetic was administered under ultrasound guidance to the level of the peritoneum. Under direct ultrasound visualization, a 6 French Safe-T-Centesis catheter was directed into the peritoneum with immediate efflux of translucent, straw-colored fluid. A total of 9.6 L was drained throughout the procedure. Intravenous albumin was administered per protocol.  Upon completion, the Safe-T-Centesis catheter was removed and a sterile bandage was applied. Preprocedure ultrasound evaluation  demonstrated patency of the right internal jugular vein. Subdermal Local anesthesia was administered with 1% lidocaine at the planned needle entry site. A small skin nick was made. Under direct ultrasound visualization, a 21 gauge micropuncture needle was directed into the right internal jugular vein. A permanent image was captured and stored the record. A micropuncture sheath was then introduced and exchanged over a Rosen wire for a 6 [place], 45 cm angled destination sheath. With the sheath tip positioned in the right atrium, central manometry was performed (mean 11 mm Hg). A 5 [place] MPA catheter was then inserted and directed into the indwelling TIPS endograft over a Rosen wire. The catheter was exchanged for a marking pigtail catheter. Portal manometry was performed (mean 29 mmHg, mean portosystemic gradient 18 mm Hg). Portal venogram was then performed which demonstrated patency of the indwelling stent. Given persistent poor hypertension, balloon molding was performed. Initially, an 8 mm x 8 cm Athletis balloon was inflated throughout the length of the stent. Central and portal manometry was then repeated after insertion of the marking pigtail catheter which demonstrated reduction in mean portosystemic gradient from 18 to 16 mm Hg (right atrium 13 mm Hg, portal vein 29 mm Hg). Therefore, additional balloon dilation with a 10 mm by 8 cm Athletis balloon was performed. Completion central and portal manometry was then performed which demonstrated further reduction of mean portosystemic gradient to 8 mmHg (right atrium 12 mm Hg, portal vein 20 mm Hg). Completion portal venogram demonstrated widely patent TIPS endograft without evidence of competing varices. A Glidewire Advantage was inserted to straighten the pigtail catheter which was removed. The right IJ sheath was removed and hemostasis was achieved with brief manual compression. The patient tolerated procedure well was transferred to the recovery area in good  condition. IMPRESSION: 1. Patent TIPS with persistent portal hypertension (mean portosystemic gradient of 18 mmHg). 2. Technically successful balloon molding to 10 mm with reduction of mean portosystemic gradient from 18 to 8 mmHg. 3. Technically successful ultrasound-guided paracentesis yielding 9.6 L ascites. PLAN: PLAN Follow-up in Portal Hypertension Clinic in 1 month. Ruthann Cancer, MD [facility] Radiology Electronically Signed   By: Ruthann Cancer M.D.   On: 05/09/2022 16:02   IR Paracentesis  Result Date: 05/09/2022 CLINICAL DATA:  67 year old male with history of NASH cirrhosis and recurrent ascites status post TIPS creation on 02/14/2022 presenting with persistent refractory ascites despite prior intervention. EXAM: 1. Ultrasound-guided paracentesis 2. Portal venogram 3. Central and portal manometry 4. Balloon angioplasty of TIPS endograft MEDICATIONS: None. ANESTHESIA/SEDATION: Moderate (conscious) sedation was employed during this procedure. A total of Versed 1 mg and Fentanyl 50 mcg was administered intravenously. Moderate Sedation Time: 40 minutes. The patient's level of consciousness and vital signs were monitored continuously by radiology nursing throughout the procedure under my direct supervision. CONTRAST:  40 mL Omnipaque 300, intravenous FLUOROSCOPY TIME:  One hundred eighty-six mGy COMPLICATIONS: None immediate. PROCEDURE: Informed written consent was obtained from the patient after a thorough discussion of the procedural risks, benefits and alternatives. All questions were addressed. Maximal Sterile Barrier Technique was utilized including caps, mask, sterile gowns, sterile gloves, sterile drape, hand hygiene and skin antiseptic. A timeout was performed prior to the initiation of the procedure. Preprocedure ultrasound evaluation of the right lower quadrant demonstrate large volume ascites. Paracentesis was planned. Subdermal Local anesthesia  was provided at the planned needle entry  site. Deeper local anesthetic was administered under ultrasound guidance to the level of the peritoneum. Under direct ultrasound visualization, a 6 French Safe-T-Centesis catheter was directed into the peritoneum with immediate efflux of translucent, straw-colored fluid. A total of 9.6 L was drained throughout the procedure. Intravenous albumin was administered per protocol. Upon completion, the Safe-T-Centesis catheter was removed and a sterile bandage was applied. Preprocedure ultrasound evaluation demonstrated patency of the right internal jugular vein. Subdermal Local anesthesia was administered with 1% lidocaine at the planned needle entry site. A small skin nick was made. Under direct ultrasound visualization, a 21 gauge micropuncture needle was directed into the right internal jugular vein. A permanent image was captured and stored the record. A micropuncture sheath was then introduced and exchanged over a Rosen wire for a 6 [place], 45 cm angled destination sheath. With the sheath tip positioned in the right atrium, central manometry was performed (mean 11 mm Hg). A 5 [place] MPA catheter was then inserted and directed into the indwelling TIPS endograft over a Rosen wire. The catheter was exchanged for a marking pigtail catheter. Portal manometry was performed (mean 29 mmHg, mean portosystemic gradient 18 mm Hg). Portal venogram was then performed which demonstrated patency of the indwelling stent. Given persistent poor hypertension, balloon molding was performed. Initially, an 8 mm x 8 cm Athletis balloon was inflated throughout the length of the stent. Central and portal manometry was then repeated after insertion of the marking pigtail catheter which demonstrated reduction in mean portosystemic gradient from 18 to 16 mm Hg (right atrium 13 mm Hg, portal vein 29 mm Hg). Therefore, additional balloon dilation with a 10 mm by 8 cm Athletis balloon was performed.  Completion central and portal manometry was then performed which demonstrated further reduction of mean portosystemic gradient to 8 mmHg (right atrium 12 mm Hg, portal vein 20 mm Hg). Completion portal venogram demonstrated widely patent TIPS endograft without evidence of competing varices. A Glidewire Advantage was inserted to straighten the pigtail catheter which was removed. The right IJ sheath was removed and hemostasis was achieved with brief manual compression. The patient tolerated procedure well was transferred to the recovery area in good condition. IMPRESSION: 1. Patent TIPS with persistent portal hypertension (mean portosystemic gradient of 18 mmHg). 2. Technically successful balloon molding to 10 mm with reduction of mean portosystemic gradient from 18 to 8 mmHg. 3. Technically successful ultrasound-guided paracentesis yielding 9.6 L ascites. PLAN: PLAN Follow-up in Portal Hypertension Clinic in 1 month. Ruthann Cancer, MD [facility] Radiology Electronically Signed   By: Ruthann Cancer M.D.   On: 05/09/2022 16:02   IR US Guide Vasc Access Right  Result Date: 05/09/2022 CLINICAL DATA:  67 year old male with history of NASH cirrhosis and recurrent ascites status post TIPS creation on 02/14/2022 presenting with persistent refractory ascites despite prior intervention. EXAM: 1. Ultrasound-guided paracentesis 2. Portal venogram 3. Central and portal manometry 4. Balloon angioplasty of TIPS endograft MEDICATIONS: None. ANESTHESIA/SEDATION: Moderate (conscious) sedation was employed during this procedure. A total of Versed 1 mg and Fentanyl 50 mcg was administered intravenously. Moderate Sedation Time: 40 minutes. The patient's level of consciousness and vital signs were monitored continuously by radiology nursing throughout the procedure under my direct supervision. CONTRAST:  40 mL Omnipaque 300, intravenous FLUOROSCOPY TIME:  One hundred eighty-six mGy  COMPLICATIONS: None immediate. PROCEDURE: Informed written consent was obtained from the patient after a thorough discussion of the procedural risks, benefits and alternatives. All questions were addressed. Maximal  Sterile Barrier Technique was utilized including caps, mask, sterile gowns, sterile gloves, sterile drape, hand hygiene and skin antiseptic. A timeout was performed prior to the initiation of the procedure. Preprocedure ultrasound evaluation of the right lower quadrant demonstrate large volume ascites. Paracentesis was planned. Subdermal Local anesthesia was provided at the planned needle entry site. Deeper local anesthetic was administered under ultrasound guidance to the level of the peritoneum. Under direct ultrasound visualization, a 6 French Safe-T-Centesis catheter was directed into the peritoneum with immediate efflux of translucent, straw-colored fluid. A total of 9.6 L was drained throughout the procedure. Intravenous albumin was administered per protocol. Upon completion, the Safe-T-Centesis catheter was removed and a sterile bandage was applied. Preprocedure ultrasound evaluation demonstrated patency of the right internal jugular vein. Subdermal Local anesthesia was administered with 1% lidocaine at the planned needle entry site. A small skin nick was made. Under direct ultrasound visualization, a 21 gauge micropuncture needle was directed into the right internal jugular vein. A permanent image was captured and stored the record. A micropuncture sheath was then introduced and exchanged over a Rosen wire for a 6 [place], 45 cm angled destination sheath. With the sheath tip positioned in the right atrium, central manometry was performed (mean 11 mm Hg). A 5 [place] MPA catheter was then inserted and directed into the indwelling TIPS endograft over a Rosen wire. The catheter was exchanged for a marking pigtail catheter. Portal manometry was performed (mean 29 mmHg, mean portosystemic gradient 18 mm  Hg). Portal venogram was then performed which demonstrated patency of the indwelling stent. Given persistent poor hypertension, balloon molding was performed. Initially, an 8 mm x 8 cm Athletis balloon was inflated throughout the length of the stent. Central and portal manometry was then repeated after insertion of the marking pigtail catheter which demonstrated reduction in mean portosystemic gradient from 18 to 16 mm Hg (right atrium 13 mm Hg, portal vein 29 mm Hg). Therefore, additional balloon dilation with a 10 mm by 8 cm Athletis balloon was performed. Completion central and portal manometry was then performed which demonstrated further reduction of mean portosystemic gradient to 8 mmHg (right atrium 12 mm Hg, portal vein 20 mm Hg). Completion portal venogram demonstrated widely patent TIPS endograft without evidence of competing varices. A Glidewire Advantage was inserted to straighten the pigtail catheter which was removed. The right IJ sheath was removed and hemostasis was achieved with brief manual compression. The patient tolerated procedure well was transferred to the recovery area in good condition. IMPRESSION: 1. Patent TIPS with persistent portal hypertension (mean portosystemic gradient of 18 mmHg). 2. Technically successful balloon molding to 10 mm with reduction of mean portosystemic gradient from 18 to 8 mmHg. 3. Technically successful ultrasound-guided paracentesis yielding 9.6 L ascites. PLAN: PLAN Follow-up in Portal Hypertension Clinic in 1 month. Ruthann Cancer, MD [facility] Radiology Electronically Signed   By: Ruthann Cancer M.D.   On: 05/09/2022 16:02   US Paracentesis  Result Date: 05/04/2022 INDICATION: Recurrent ascites; cirrhosis EXAM: ULTRASOUND GUIDED RLQ PARACENTESIS MEDICATIONS: 10 cc 1% lidocaine COMPLICATIONS: None immediate. PROCEDURE: Informed written consent was obtained from the patient after a discussion of the risks,  benefits and alternatives to treatment. A timeout was performed prior to the initiation of the procedure. Initial ultrasound scanning demonstrates a large amount of ascites within the right lower abdominal quadrant. The right lower abdomen was prepped and draped in the usual sterile fashion. 1% lidocaine was used for local anesthesia. Following this, a  7 cm Yueh catheter was introduced. An ultrasound image was saved for documentation purposes. The paracentesis was performed. The catheter was removed and a dressing was applied. The patient tolerated the procedure well without immediate post procedural complication. Patient received post-procedure intravenous albumin; see nursing notes for details. FINDINGS: A total of approximately 4 liters (max) of yellow fluid was removed. Samples were sent to the laboratory as requested by the clinical team. IMPRESSION: Successful ultrasound-guided paracentesis yielding 4 liters of peritoneal fluid. TIPs procedure performed in IR 02/15/23 Read by Lavonia Drafts [facility] Electronically Signed   By: Lavonia Dana M.D.   On: 05/04/2022 14:20   US ABDOMINAL PELVIC ART/VENT FLOW DOPPLER  Result Date: 04/29/2022 CLINICAL DATA:  67 year old male with history of NASH cirrhosis and refractory ascites status post TIPS placement. EXAM: DUPLEX ULTRASOUND OF LIVER AND TIPS SHUNT TECHNIQUE: Color and duplex Doppler ultrasound was performed to evaluate the hepatic in-flow and out-flow vessels. COMPARISON:  02/14/2022, 10/15/2021 FINDINGS: Portal Vein Velocities Main:  65 cm/sec, antegrade Right:  114 cm/sec, antegrade Left:  18 cm/sec, retrograde TIPS Stent Velocities Portal: 114 cm/sec Mid:  127 Central: 149 cm/sec IVC: Present and patent with normal respiratory phasicity. Hepatic Vein Velocities Right:  55 cm/sec Mid:  149 cm/sec Left:  42 cm/sec Splenic Vein: Patent. Superior Mesenteric Vein: Not visualized. Hepatic Artery: 81 centimeters/second Ascities: Present Varices: Absent Other findings:  Similar cirrhotic appearance of the liver. IMPRESSION: Patent indwelling TIPS endograft. Ruthann Cancer, MD [facility] Radiology Electronically Signed   By: Ruthann Cancer M.D.   On: 04/29/2022 12:41  [2 weeks]    Assessment:   67 year old male with history of anxiety/depression, stroke, diabetes, hypertension, GERD, decompensated cirrhosis presented to the hospital Tuesday with concern for SBP due to paracentesis Monday with Gram stain showing gram-positive cocci in the anaerobic bottle only.  Possible SBP: clinically does not appear to have SBP.  PMN 28.  Anaerobic bottle grew Strep salivirus.  He received IV ceftriaxone and IV albumin.  Denies abdominal pain. Plans to discharge home due to likely contaminant. No need for SBP prophylaxis. He is requesting therapeutic paracentesis prior to discharge.  Cirrhosis: Secondary to NASH plus or minus previous EtOH: Course complicated by ascites/peripheral edema and hepatic encephalopathy.  Frequent paracenteses started August 2023 status post TIPS in December due to requirement for frequent p.o. in the setting of renal insufficiency and diuretic discontinuation.  March 4 TIPS revision.  Seen by nephrology earlier this week and started on spironolactone 25 mg daily and torsemide 10 mg Monday, Wednesday, Friday.    Plan:   OK for D/C LVAP prior to D/C. Continue spironolactone 25mg  daily. Continue torsedmide 10mg  MWF.  Follow up OV to be arranged.    LOS: 2 days   Laureen Ochs. Bernarda Caffey [facility] [phone] 3/21/202412:42 PM

## 2022-05-27 ENCOUNTER — Telehealth: Payer: Self-pay

## 2022-05-27 NOTE — Transitions of Care (Post Inpatient/ED Visit) (Signed)
   05/27/2022  Name: Shawn [name]. MRN: [id number] DOB: 1955-05-30  Today's TOC FU Call Status: Today's TOC FU Call Status:: Successful TOC FU Call Competed TOC FU Call Complete Date: 05/27/22  Transition Care Management Follow-up Telephone Call Date of Discharge: 05/26/22 Discharge [facility]: [facility] Type of Discharge: Inpatient Admission Primary Inpatient Discharge Diagnosis:: spontaneous bacterial peritonitis How have you been since you were released from the hospital?: Better Any questions or concerns?: No  Items Reviewed: Did you receive and understand the discharge instructions provided?: Yes Medications obtained and verified?: Yes (Medications Reviewed) Any new allergies since your discharge?: No Dietary orders reviewed?: Yes Do you have support at home?: Yes People in Home: spouse  Home Care and Equipment/Supplies: [place] Ordered?: NA Any new equipment or medical supplies ordered?: NA  Functional Questionnaire: Do you need assistance with bathing/showering or dressing?: No Do you need assistance with meal preparation?: No Do you need assistance with eating?: No Do you have difficulty maintaining continence: No Do you need assistance with getting out of bed/getting out of a chair/moving?: No Do you have difficulty managing or taking your medications?: No  Follow up appointments reviewed: PCP Follow-up appointment confirmed?: No (declined at this time) [facility] Follow-up appointment confirmed?: Yes Date of Specialist follow-up appointment?: 05/30/22 Follow-Up Specialty Provider:: Dr Royetta Asal Do you need transportation to your follow-up appointment?: No Do you understand care options if your condition(s) worsen?: Yes-patient verbalized understanding   [place], [place] Nurse Health Advisor Direct Dial [phone]

## 2022-05-27 NOTE — Transitions of Care (Post Inpatient/ED Visit) (Signed)
   05/27/2022  Name: Shawn [name]. MRN: [id number] DOB: [birth year]  Today's TOC FU Call Status: Today's TOC FU Call Status:: Successful TOC FU Call Competed  Transition Care Management Follow-up Telephone Call Date of Discharge: 05/26/22 Discharge Facility: [facility] ([facility] Type of Discharge: Inpatient Admission Primary Inpatient Discharge Diagnosis:: Spontaneous Bacterial Peritonitis How have you been since you were released from the hospital?: Better    Patient requested a call back as he was eating at the time of call.  Returned call and left message, in the meantime TOC call completed by [facility].  Johnney Killian, RN, BSN, CCM Care Management Coordinator [facility] Phone: [phone]: [phone]

## 2022-05-28 LAB — CULTURE, BODY FLUID W GRAM STAIN -BOTTLE: Special Requests: ADEQUATE

## 2022-05-29 LAB — CULTURE, BLOOD (ROUTINE X 2)
Culture: NO GROWTH
Culture: NO GROWTH
Special Requests: ADEQUATE

## 2022-05-30 ENCOUNTER — Ambulatory Visit: Payer: Self-pay | Admitting: *Deleted

## 2022-05-30 ENCOUNTER — Telehealth: Payer: Self-pay | Admitting: *Deleted

## 2022-05-30 DIAGNOSIS — I129 Hypertensive chronic kidney disease with stage 1 through stage 4 chronic kidney disease, or unspecified chronic kidney disease: Secondary | ICD-10-CM | POA: Diagnosis not present

## 2022-05-30 DIAGNOSIS — I5032 Chronic diastolic (congestive) heart failure: Secondary | ICD-10-CM | POA: Diagnosis not present

## 2022-05-30 DIAGNOSIS — E876 Hypokalemia: Secondary | ICD-10-CM | POA: Diagnosis not present

## 2022-05-30 DIAGNOSIS — N189 Chronic kidney disease, unspecified: Secondary | ICD-10-CM | POA: Diagnosis not present

## 2022-05-30 DIAGNOSIS — E1122 Type 2 diabetes mellitus with diabetic chronic kidney disease: Secondary | ICD-10-CM | POA: Diagnosis not present

## 2022-05-30 DIAGNOSIS — D6959 Other secondary thrombocytopenia: Secondary | ICD-10-CM | POA: Diagnosis not present

## 2022-05-30 DIAGNOSIS — R601 Generalized edema: Secondary | ICD-10-CM | POA: Diagnosis not present

## 2022-05-30 DIAGNOSIS — K7460 Unspecified cirrhosis of liver: Secondary | ICD-10-CM | POA: Diagnosis not present

## 2022-05-30 DIAGNOSIS — D731 Hypersplenism: Secondary | ICD-10-CM | POA: Diagnosis not present

## 2022-05-30 NOTE — Telephone Encounter (Signed)
Spoke to pt, informed him of recommendations. Pt voiced understanding.  

## 2022-05-30 NOTE — Chronic Care Management (AMB) (Signed)
   05/30/2022  [name] 1955/08/19 [id number]   Enrollment status changed to previously enrolled.  Jacqlyn Larsen [facility], BSN RN Case Manager [phone]

## 2022-05-30 NOTE — Telephone Encounter (Signed)
Due to history of hepatic encephalopathy, I recommend that he avoid driving.

## 2022-05-30 NOTE — Telephone Encounter (Signed)
Pt called and states is it ok for him to drive. Pt states she fell a while ago and was told not to drive. Please advise.

## 2022-05-31 ENCOUNTER — Ambulatory Visit (INDEPENDENT_AMBULATORY_CARE_PROVIDER_SITE_OTHER): Payer: Medicare Other | Admitting: Family Medicine

## 2022-05-31 VITALS — BP 115/70 | HR 70 | Ht 65.0 in | Wt 218.2 lb

## 2022-05-31 DIAGNOSIS — K652 Spontaneous bacterial peritonitis: Secondary | ICD-10-CM | POA: Diagnosis not present

## 2022-05-31 DIAGNOSIS — D696 Thrombocytopenia, unspecified: Secondary | ICD-10-CM | POA: Diagnosis not present

## 2022-05-31 DIAGNOSIS — N1832 Chronic kidney disease, stage 3b: Secondary | ICD-10-CM | POA: Diagnosis not present

## 2022-05-31 DIAGNOSIS — E1122 Type 2 diabetes mellitus with diabetic chronic kidney disease: Secondary | ICD-10-CM

## 2022-05-31 DIAGNOSIS — K7460 Unspecified cirrhosis of liver: Secondary | ICD-10-CM

## 2022-05-31 LAB — CULTURE, BODY FLUID W GRAM STAIN -BOTTLE: Culture: NO GROWTH

## 2022-05-31 NOTE — Progress Notes (Unsigned)
   Subjective:    Patient ID: Shawn [name]., male    DOB: Jun 06, 1955, 67 y.o.   MRN: [id number]  HPI Patient arrives to for hospital follow up.  Recently in the hospital Hospital notes reviewed Hospital circumstance reviewed with patient and his wife Medications were reviewed Recent labs and testing reviewed as well Patient denies any fever chills sweats wheezing difficulty breathing States energy acceptable    Review of Systems     Objective:   Physical Exam General-in no acute distress Eyes-no discharge Lungs-respiratory rate normal, CTA CV-no murmurs,RRR Abdomen with ascites Extremities skin warm dry moderate lower leg edema Neuro grossly normal Behavior normal, alert        Assessment & Plan:  Hospitalization follow-up No longer on antibiotics The bacteria was more than likely a contaminant He will undergo the abdominal paracentesis twice per week He recently had blood drawn yesterday by Dr. Theador Hawthorne we will get a copy of this sent to [place] Patient will be monitored closely May continue current medications Has follow-up visit in approximately 2 months would be a good idea to keep this Blood pressure sitting and standing are good no sign of orthostasis Warning signs regarding infection was discussed in detail

## 2022-06-01 ENCOUNTER — Ambulatory Visit (HOSPITAL_COMMUNITY)
Admission: RE | Admit: 2022-06-01 | Discharge: 2022-06-01 | Disposition: A | Discharge status: Home / Self Care | Payer: Medicare Other | Source: Ambulatory Visit | Attending: Gastroenterology | Admitting: Gastroenterology

## 2022-06-01 ENCOUNTER — Encounter (HOSPITAL_COMMUNITY): Payer: Self-pay

## 2022-06-01 ENCOUNTER — Other Ambulatory Visit: Payer: Self-pay | Admitting: Gastroenterology

## 2022-06-01 DIAGNOSIS — K7682 Hepatic encephalopathy: Secondary | ICD-10-CM

## 2022-06-01 DIAGNOSIS — R188 Other ascites: Secondary | ICD-10-CM | POA: Diagnosis not present

## 2022-06-01 DIAGNOSIS — K7460 Unspecified cirrhosis of liver: Secondary | ICD-10-CM | POA: Diagnosis not present

## 2022-06-01 LAB — GRAM STAIN: Gram Stain: NONE SEEN

## 2022-06-01 LAB — BODY FLUID CELL COUNT WITH DIFFERENTIAL
Eos, Fluid: 0 %
Lymphs, Fluid: 12 %
Monocyte-Macrophage-Serous Fluid: 62 % (ref 50–90)
Neutrophil Count, Fluid: 26 % — ABNORMAL HIGH (ref 0–25)
Total Nucleated Cell Count, Fluid: 246 cu mm (ref 0–1000)

## 2022-06-01 MED ORDER — LACTULOSE 10 GM/15ML PO SOLN: 20.0000 g | Freq: Three times a day (TID) | ORAL | 5 refills | Status: DC

## 2022-06-01 MED ORDER — ALBUMIN HUMAN 25 % IV SOLN: 25.0000 g | Freq: Once | INTRAVENOUS | Status: AC

## 2022-06-01 MED ORDER — ALBUMIN HUMAN 25 % IV SOLN
INTRAVENOUS | Status: AC
  Administered 2022-06-01: 25 g via INTRAVENOUS
  Filled 2022-06-01: qty 100

## 2022-06-01 NOTE — Procedures (Signed)
   NASH Cirrhosis; recurrent ascites  US guided RLQ paracentesis 4 liters (max per MD)-- yellow fluid Sent for labs  Tolerated well  EBL: scant

## 2022-06-01 NOTE — Progress Notes (Signed)
Paracentesis complete 25G IV Albumin given and 4L ascites removed. No signs of distress.

## 2022-06-02 LAB — CYTOLOGY - NON PAP

## 2022-06-05 ENCOUNTER — Other Ambulatory Visit: Payer: Self-pay | Admitting: Family Medicine

## 2022-06-06 ENCOUNTER — Ambulatory Visit (HOSPITAL_COMMUNITY)
Admission: RE | Admit: 2022-06-06 | Discharge: 2022-06-06 | Disposition: A | Discharge status: Home / Self Care | Payer: Medicare Other | Source: Ambulatory Visit | Attending: Gastroenterology | Admitting: Gastroenterology

## 2022-06-06 ENCOUNTER — Ambulatory Visit: Payer: BLUE CROSS/BLUE SHIELD | Admitting: Gastroenterology

## 2022-06-06 ENCOUNTER — Encounter (HOSPITAL_COMMUNITY): Payer: Self-pay

## 2022-06-06 DIAGNOSIS — K7460 Unspecified cirrhosis of liver: Secondary | ICD-10-CM | POA: Diagnosis not present

## 2022-06-06 DIAGNOSIS — R188 Other ascites: Secondary | ICD-10-CM | POA: Insufficient documentation

## 2022-06-06 LAB — BODY FLUID CELL COUNT WITH DIFFERENTIAL
Eos, Fluid: 1 %
Lymphs, Fluid: 50 %
Monocyte-Macrophage-Serous Fluid: 37 % — ABNORMAL LOW (ref 50–90)
Neutrophil Count, Fluid: 12 % (ref 0–25)
Total Nucleated Cell Count, Fluid: 212 cu mm (ref 0–1000)

## 2022-06-06 LAB — CULTURE, BODY FLUID W GRAM STAIN -BOTTLE: Culture: NO GROWTH

## 2022-06-06 LAB — GRAM STAIN: Gram Stain: NONE SEEN

## 2022-06-06 NOTE — Progress Notes (Signed)
Patient tolerated right sided paracentesis procedure well today and 3.8 liters of clear yellow ascites removed with labs collected and sent to lab for processing. Patient ambulatory at departure, verbalized understanding of discharge instructions with no acute distress noted.

## 2022-06-06 NOTE — Procedures (Signed)
PreOperative Dx: Cirrhosis, ascites Postoperative Dx: Cirrhosis, ascites Procedure:   US guided paracentesis Radiologist:  Fama Muenchow Anesthesia:  10 ml of1% lidocaine Specimen:  3.8 L of yellow ascitic fluid EBL:   < 1 ml Complications: None   

## 2022-06-07 ENCOUNTER — Other Ambulatory Visit: Payer: Self-pay | Admitting: Family Medicine

## 2022-06-07 ENCOUNTER — Inpatient Hospital Stay: Payer: Medicare Other | Attending: Hematology | Admitting: Physician Assistant

## 2022-06-07 VITALS — BP 111/71 | HR 84 | Temp 98.6°F | Resp 18 | Ht 65.0 in | Wt 203.0 lb

## 2022-06-07 DIAGNOSIS — K7460 Unspecified cirrhosis of liver: Secondary | ICD-10-CM | POA: Diagnosis not present

## 2022-06-07 DIAGNOSIS — D696 Thrombocytopenia, unspecified: Secondary | ICD-10-CM

## 2022-06-07 DIAGNOSIS — K7581 Nonalcoholic steatohepatitis (NASH): Secondary | ICD-10-CM | POA: Insufficient documentation

## 2022-06-07 DIAGNOSIS — E610 Copper deficiency: Secondary | ICD-10-CM | POA: Insufficient documentation

## 2022-06-07 DIAGNOSIS — Z800 Family history of malignant neoplasm of digestive organs: Secondary | ICD-10-CM | POA: Insufficient documentation

## 2022-06-07 DIAGNOSIS — D631 Anemia in chronic kidney disease: Secondary | ICD-10-CM | POA: Diagnosis not present

## 2022-06-07 DIAGNOSIS — I129 Hypertensive chronic kidney disease with stage 1 through stage 4 chronic kidney disease, or unspecified chronic kidney disease: Secondary | ICD-10-CM | POA: Insufficient documentation

## 2022-06-07 DIAGNOSIS — N182 Chronic kidney disease, stage 2 (mild): Secondary | ICD-10-CM | POA: Diagnosis not present

## 2022-06-07 DIAGNOSIS — D649 Anemia, unspecified: Secondary | ICD-10-CM | POA: Diagnosis not present

## 2022-06-07 DIAGNOSIS — E1122 Type 2 diabetes mellitus with diabetic chronic kidney disease: Secondary | ICD-10-CM | POA: Diagnosis not present

## 2022-06-07 DIAGNOSIS — Z803 Family history of malignant neoplasm of breast: Secondary | ICD-10-CM | POA: Insufficient documentation

## 2022-06-07 DIAGNOSIS — Z79899 Other long term (current) drug therapy: Secondary | ICD-10-CM | POA: Diagnosis not present

## 2022-06-07 DIAGNOSIS — Z7984 Long term (current) use of oral hypoglycemic drugs: Secondary | ICD-10-CM | POA: Diagnosis not present

## 2022-06-07 DIAGNOSIS — Z7982 Long term (current) use of aspirin: Secondary | ICD-10-CM | POA: Insufficient documentation

## 2022-06-07 MED ORDER — COPPER 5 MG PO TABS: ORAL_TABLET | ORAL | 0 refills | Status: DC

## 2022-06-07 NOTE — Progress Notes (Signed)
[place], [place] [address]   CLINIC:  Medical Oncology/Hematology  PCP:  [name], MD [address] [place] [address] [phone]   REASON FOR VISIT:  Follow-up for normocytic anemia and thrombocytopenia  CURRENT THERAPY: Under workup  INTERVAL HISTORY:   Shawn [name] 67 y.o. male returns for routine follow-up of normocytic anemia and thrombocytopenia.  He was seen for initial consultation by Dr. Delton Coombes on 05/18/2022.  At today's visit, he reports feeling fair.  In the interim since his last visit, he was hospitalized from 05/24/2022 through 05/26/2022 for spontaneous bacterial peritonitis.  He feels that he is recovering from this fairly well.  He denies any major bleeding events such as rectal bleeding, melena, or epistaxis.  He reports easy bruising and notes that he is also on aspirin 81 mg daily.  He reports baseline fatigue with energy about 60%.  He denies any pagophagia but reports that he frequently eats popsicles.  He reports 85% appetite and states that he is maintaining a stable weight   ASSESSMENT & PLAN:  1.  Normocytic anemia - PMH: NASH cirrhosis +/- EtOH, TIPS placement, CKD - No prior transfusion history.  No parenteral iron therapy.  He is not on oral iron therapy.  No ice pica. - Multifactorial etiology of anemia with differential including: Anemia from CKD stage II/IIIa (creatinine 1.29/GFR 60, although this may underestimate degree of anemia in patient with comorbid cirrhosis) Copper deficiency (copper level at 40 as of 05/18/2022).  Other labs showed normal B12, folate, MMA. At risk for GI bleeding due to liver cirrhosis: Denies any rectal bleeding or melena, but has never had previous EGD or colonoscopy.  Hemoccult stool negative x 3 (05/2022), but reticulocytes elevated at 3.3%.  Labs from 05/06/2022 show normal iron (ferritin 191, 64% sat) Plasma cell dyscrasia remains within differential, as he has  elevated kappa light chains one 1.3, elevated lambda light chains 84.5, and normal FLC ratio 1.20.  SPEP negative for M spike. Hemolysis unlikely, but reticulocytes elevated at 3.3%, elevated bilirubin (cirrhosis).  Normal LDH and DAT. - Most recent CBC (05/26/2022): Hgb 8.7/MCV 97.7 - PLAN: Recommend copper supplementation 5 mg twice daily x 1 week, then daily. - Check CBC + BB sample every 2 weeks due to Hgb trending downward - Additional labs to be checked in 4 weeks: Cystatin C (evaluate for CKD in the setting of cirrhosis) Haptoglobin, LDH, fractionated bilirubin, reticulocytes (evaluate for hemolysis due to elevated reticulocytes/bilirubin) 24-hour urine/UPEP/UIFE and serum immunofixation (complete workup of plasma cell dyscrasia) Recheck copper, ferritin, iron/TIBC - Patient reports that he is seeing GI to consider upcoming EGD/colonoscopy - If no improvement after copper supplementation, consider starting Retacrit and/or obtaining bone marrow biopsy.  2.  Mild thrombocytopenia - PMH: NASH cirrhosis +/- EtOH, TIPS placement, CKD - Ultrasound spleen (10/15/2021): Splenomegaly measuring 12 x 14 x 6 cm, volume 550 cm - Admits to easy bruising.  Denies any bleeding per rectum or melena. - Hematology workup (05/18/2022): No evidence of acute hepatitis B or hepatitis C.  Normal RF and ANA. - Copper significantly low at 40 (05/18/2022).  Normal B12, folate, MMA. - Most recent CBC (05/26/2022): Platelets 128  - DIFFERENTIAL DIAGNOSIS: Most likely from splenic sequestration and copper deficiency.  However, with low immature platelet fraction, bone marrow disorder remains within the differential. - PLAN: Copper supplementation 5 mg twice daily x 1 week, then 5 mg daily. - Recheck copper, CBC, and immature platelet  fraction in 4 weeks. - If immature platelet fraction remains low despite copper supplementation, consider bone marrow biopsy.  3.  (Questionable) heart murmur - Questionable faint  systolic murmur at RUSB - Patient denies any previous diagnosis of heart murmur - Most recent echo (01/05/2022) showed aortic valve sclerosis/calcification without any evidence of aortic stenosis or regurgitation - PLAN: Attention to cardiac auscultation at next visit, would consider cardiology referral if indicated.  4.  Social/family history: - Lives at home with his wife.  He is a retired Architectural technologist, worked at a [employer].  Non-smoker.  Exposure to some cleaning chemicals. - No family history of transfusion dependent anemia.  Mother had breast cancer and colon cancer.  Father had cancer.  Half sister had breast cancer.  Brother and sister had cirrhosis.   PLAN SUMMARY: >> Prescription sent to pharmacy for copper 5 mg twice daily x 1 week, then copper 5 mg daily >> 24-hour urine with UPEP/UIFE >> Labs in 2 weeks = CBC + BB sample >> Labs in 4 weeks = CBC/D + BB sample, immunofixation, reticulocytes, LDH, bilirubin fractionated, copper, iron/TIBC, ferritin, haptoglobin, immature platelet fraction, Cystatin C (Miscellaneous [facility] test) >> Labs in 6 weeks = CBC/D + BB sample >> OFFICE visit in 6 weeks (same day as CBC)     REVIEW OF SYSTEMS:   Review of Systems  Constitutional:  Positive for fatigue. Negative for appetite change, chills, diaphoresis, fever and unexpected weight change.  HENT:   Positive for trouble swallowing (difficulty chewing). Negative for lump/mass and nosebleeds.   Eyes:  Negative for eye problems.  Respiratory:  Negative for cough, hemoptysis and shortness of breath.   Cardiovascular:  Negative for chest pain, leg swelling and palpitations.  Gastrointestinal:  Positive for diarrhea. Negative for abdominal pain, blood in stool, constipation, nausea and vomiting.  Genitourinary:  Positive for frequency. Negative for hematuria.   Skin: Negative.   Neurological:  Negative for dizziness, headaches and light-headedness.  Hematological:  Does  not bruise/bleed easily.  Psychiatric/Behavioral:  Positive for sleep disturbance.      PHYSICAL EXAM:  ECOG PERFORMANCE STATUS: 1 - Symptomatic but completely ambulatory  There were no vitals filed for this visit. There were no vitals filed for this visit. Physical Exam Constitutional:      Appearance: Normal appearance. He is obese.  Cardiovascular:     Heart sounds: Murmur (Questionable faint systolic murmur at RUSB) heard.  Pulmonary:     Breath sounds: Normal breath sounds.  Neurological:     General: No focal deficit present.     Mental Status: Mental status is at baseline.  Psychiatric:        Behavior: Behavior normal. Behavior is cooperative.     PAST MEDICAL/SURGICAL HISTORY:  Past Medical History:  Diagnosis Date   ADD (attention deficit disorder)    ADHD    Anxiety 1987   mild (crowds, noises)   Anxiety    Attention deficit disorder    Cancer    Cirrhosis of liver    CVA (cerebral vascular accident)    Depression    Diabetic neuropathy    Essential hypertension, benign    Long-standing history, negative secondary workup   Headache    History of cardiac catheterization    Widely patent coronary and renal arteries November 2013   Hyperlipidemia    Hypertension    whitecoat   Obstructive sleep apnea    cpap   Prediabetes    Sleep apnea  TIA (transient ischemic attack)    Possible, 2007   Type 2 diabetes mellitus    Venous stasis    Past Surgical History:  Procedure Laterality Date   ANTERIOR CERVICAL DECOMP/DISCECTOMY FUSION N/A 12/30/2015   Procedure: ANTERIOR CERVICAL DECOMPRESSION/DISCECTOMY FUSION C6 - C7 1 LEVEL;  Surgeon: Melina Schools, MD;  Location: [facility];  Service: Orthopedics;  Laterality: N/A;   CARDIAC CATHETERIZATION  01/2012   negative   CARPAL TUNNEL RELEASE     CERVICAL SPINE NERVE BLOCK  05/17/2017   IR INTRAVASCULAR ULTRASOUND NON CORONARY  02/14/2022   IR PARACENTESIS  02/14/2022   IR PARACENTESIS  05/09/2022   IR  RADIOLOGIST EVAL & MGMT  12/03/2021   IR RADIOLOGIST EVAL & MGMT  01/13/2022   IR RADIOLOGIST EVAL & MGMT  03/24/2022   IR RADIOLOGIST EVAL & MGMT  04/20/2022   IR TIPS  02/14/2022   IR TIPS REVISION MOD SED  05/09/2022   IR US GUIDE VASC ACCESS RIGHT  02/14/2022   IR US GUIDE VASC ACCESS RIGHT  02/14/2022   IR US GUIDE VASC ACCESS RIGHT  05/09/2022   KNEE SURGERY Right    NECK SURGERY     RADIOLOGY WITH ANESTHESIA N/A 02/14/2022   Procedure: TIPS;  Surgeon: Suzette Battiest, MD;  Location: [facility];  Service: Radiology;  Laterality: N/A;   VASECTOMY      SOCIAL HISTORY:  Social History   Socioeconomic History   Marital status: Married    Spouse name: Not on file   Number of children: 4   Years of education: Not on file   Highest education level: Not on file  Occupational History   Occupation: maintainer   Tobacco Use   Smoking status: Never   Smokeless tobacco: Current    Types: Snuff   Tobacco comments:    Uses dip pouches occasionally  Vaping Use   Vaping Use: Never used  Substance and Sexual Activity   Alcohol use: No    Alcohol/week: 0.0 standard drinks of alcohol    Comment: Previously 1-2 cases of beer/week. patient states never on regular basis though. last etoh in 2013.    Drug use: No   Sexual activity: Not Currently  Other Topics Concern   Not on file  Social History Narrative   ** Merged History Encounter **       Social Determinants of Health   Financial Resource Strain: Low Risk  (01/04/2022)   Overall Financial Resource Strain (CARDIA)    Difficulty of Paying Living Expenses: Not hard at all  Food Insecurity: No Food Insecurity (05/24/2022)   Hunger Vital Sign    Worried About Running Out of Food in the Last Year: Never true    Ran Out of Food in the Last Year: Never true  Transportation Needs: No Transportation Needs (05/24/2022)   PRAPARE - Hydrologist (Medical): No    Lack of Transportation (Non-Medical): No  Physical  Activity: Inactive (01/04/2022)   Exercise Vital Sign    Days of Exercise per Week: 0 days    Minutes of Exercise per Session: 0 min  Stress: No Stress Concern Present (01/04/2022)   [place]    Feeling of Stress : Only a little  Social Connections: Moderately Integrated (01/04/2022)   Social Connection and Isolation Panel [NHANES]    Frequency of Communication with Friends and Family: More than three times a week    Frequency of  Social Gatherings with Friends and Family: Once a week    Attends Religious Services: More than 4 times per year    Active Member of [employer] or Organizations: No    Attends Archivist Meetings: Never    Marital Status: Married  Human resources officer Violence: Not At Risk (05/24/2022)   Humiliation, Afraid, Rape, and Kick questionnaire    Fear of Current or Ex-Partner: No    Emotionally Abused: No    Physically Abused: No    Sexually Abused: No    FAMILY HISTORY:  Family History  Problem Relation Age of Onset   Colon cancer Mother    Hypertension Mother    CAD Mother    Breast cancer Mother    Cirrhosis Brother     CURRENT MEDICATIONS:  Outpatient Encounter Medications as of 06/07/2022  Medication Sig   ascorbic Acid (VITAMIN C) 500 MG CPCR Take 500 mg by mouth daily.   aspirin EC 81 MG tablet Take 81 mg by mouth daily. Swallow whole.   Blood Glucose Monitoring Suppl DEVI 1 each by Does not apply route in the morning, at noon, and at bedtime. May substitute to any manufacturer covered by patient's insurance Dx E11.22   Cholecalciferol (D3 VITAMIN PO) Take by mouth daily. 5000 IU daily   dorzolamide (TRUSOPT) 2 % ophthalmic solution 1 drop 2 (two) times daily.   fluticasone (FLONASE) 50 MCG/ACT nasal spray Place 2 sprays into both nostrils daily.   Glucose Blood (BLOOD GLUCOSE TEST STRIPS) STRP Use to check blood sugar once daily before meals. May substitute to any manufacturer  covered by patient's insurance E11.22   lactulose (CHRONULAC) 10 GM/15ML solution Take 30 mLs (20 g total) by mouth 3 (three) times daily.   Lancet Device MISC Use to check blood sugar once daily before meals. May substitute to any manufacturer covered by patient's insurance E11.22   Lancets Misc. MISC Use to check blood sugar once daily before meals. May substitute to any manufacturer covered by patient's insurance   metFORMIN (GLUCOPHAGE) 500 MG tablet 1 qd (Patient taking differently: Take 500 mg by mouth daily with breakfast.)   Milk Thistle 1000 MG CAPS Take by mouth daily.   Omega-3 Fatty Acids (FISH OIL) 1000 MG CAPS Take by mouth daily.   pantoprazole (PROTONIX) 40 MG tablet Take 1 tablet (40 mg total) by mouth daily.   rifaximin (XIFAXAN) 550 MG TABS tablet Take 1 tablet (550 mg total) by mouth 2 (two) times daily.   sodium bicarbonate 650 MG tablet Take 650 mg by mouth 3 (three) times daily. Morning, evening, bedtime   spironolactone (ALDACTONE) 25 MG tablet Take 25 mg by mouth daily.   torsemide (DEMADEX) 10 MG tablet Take 10 mg by mouth every other day. For two weeks   venlafaxine XR (EFFEXOR-XR) 150 MG 24 hr capsule Take 1 capsule (150 mg total) by mouth 2 (two) times daily.   vitamin E 180 MG (400 UNITS) capsule Take 400 Units by mouth daily.   zinc gluconate 50 MG tablet Take 50 mg by mouth daily.   No facility-administered encounter medications on file as of 06/07/2022.    ALLERGIES:  No Known Allergies  LABORATORY DATA:  I have reviewed the labs as listed.  CBC    Component Value Date/Time   WBC 5.2 05/26/2022 0438   RBC 2.65 (L) 05/26/2022 0438   HGB 8.7 (L) 05/26/2022 0438   HGB 11.2 (L) 05/03/2022 0903   HCT 25.9 (L) 05/26/2022 [id number]  HCT 32.7 (L) 05/03/2022 0903   PLT 128 (L) 05/26/2022 0438   PLT 164 05/03/2022 0903   MCV 97.7 05/26/2022 0438   MCV 92 05/03/2022 0903   MCH 32.8 05/26/2022 0438   MCHC 33.6 05/26/2022 0438   RDW 15.9 (H) 05/26/2022 0438   RDW  13.7 05/03/2022 0903   LYMPHSABS 0.8 05/24/2022 1127   LYMPHSABS 1.0 05/03/2022 0903   MONOABS 0.6 05/24/2022 1127   EOSABS 0.6 (H) 05/24/2022 1127   EOSABS 0.6 (H) 05/03/2022 0903   BASOSABS 0.1 05/24/2022 1127   BASOSABS 0.1 05/03/2022 0903      Latest Ref Rng & Units 05/26/2022    4:38 AM 05/25/2022    4:47 AM 05/24/2022   11:27 AM  CMP  Glucose 70 - 99 mg/dL 121  121  114   BUN 8 - 23 mg/dL 21  20  19    Creatinine 0.61 - 1.24 mg/dL 1.29  1.24  1.38   Sodium 135 - 145 mmol/L 142  141  139   Potassium 3.5 - 5.1 mmol/L 3.6  2.9  3.6   Chloride 98 - 111 mmol/L 110  109  108   CO2 22 - 32 mmol/L 26  24  25    Calcium 8.9 - 10.3 mg/dL 8.7  8.4  8.4   Total Protein 6.5 - 8.1 g/dL 5.5  5.3  5.3   Total Bilirubin 0.3 - 1.2 mg/dL 1.9  1.9  1.7   Alkaline Phos 38 - 126 U/L 105  112  144   AST 15 - 41 U/L 47  44  57   ALT 0 - 44 U/L 24  24  31      DIAGNOSTIC IMAGING:  I have independently reviewed the relevant imaging and discussed with the patient.   WRAP UP:  All questions were answered. The patient knows to call the clinic with any problems, questions or concerns.  Medical decision making: Moderate  Time spent on visit: I spent 20 minutes counseling the patient face to face. The total time spent in the appointment was 30 minutes and more than 50% was on counseling.  Harriett Rush, PA-C  06/07/22 11:32 PM

## 2022-06-07 NOTE — Patient Instructions (Signed)
[facility] **   You were seen today by Tarri Abernethy PA-C for your anemia and low platelets (thrombocytopenia).    LOW PLATELETS This may be related to your enlarged spleen from your liver cirrhosis. This may also be related to your copper deficiency. If you continue to have significant platelet abnormalities after your copper levels have improved, we would consider additional testing such as a bone marrow biopsy.  ANEMIA Your anemia may be related to copper deficiency, chronic kidney disease, or possible blood loss. We will keep a close watch on your blood counts to see if they improve appropriately after starting you on copper. We will also check additional tests related to the kappa/lambda light chains to ensure that you do not have any bone marrow protein disorder that is causing anemia.  LABS: - We will check blood count (CBC) every 2 weeks - Will check full lab panel in 4 weeks  OTHER TESTS: Please complete 24-hour urine study and bring it back to the [facility] once completed.  MEDICATIONS: Start taking copper 5 mg twice daily x 1 week, and then cut back to copper 5 mg once daily.  FOLLOW-UP APPOINTMENT: Office visit in 6 weeks (2 weeks after full lab panel)  ** Thank you for trusting me with your healthcare!  I strive to provide all of my patients with quality care at each visit.  If you receive a survey for this visit, I would be so grateful to you for taking the time to provide feedback.  Thank you in advance!  ~ Aldeen Riga                   Dr. [name]   &   Tarri Abernethy, PA-C   - - - - - - - - - - - - - - - - - -    Thank you for choosing [facility] to provide your oncology and hematology care.  To afford each patient quality time with our provider, please arrive at least 15 minutes before your scheduled appointment time.   If you  have a lab appointment with the [facility] please come in thru the Main Entrance and check in at the main information desk.  You need to re-schedule your appointment should you arrive 10 or more minutes late.  We strive to give you quality time with our providers, and arriving late affects you and other patients whose appointments are after yours.  Also, if you no show three or more times for appointments you may be dismissed from the clinic at the providers discretion.     Again, thank you for choosing [facility].  Our hope is that these requests will decrease the amount of time that you wait before being seen by our physicians.       _____________________________________________________________  Should you have questions after your visit to [facility], please contact our office at [phone] and follow the prompts.  Our office hours are 8:00 a.m. and 4:30 p.m. Monday - Friday.  Please note that voicemails left after 4:00 p.m. may not be returned until the following business day.  We are closed weekends and major holidays.  You do have access to a nurse 24-7, just call the main number to the clinic [phone] and do not press any options, hold on the line and a nurse will answer the  phone.    For prescription refill requests, have your pharmacy contact our office and allow 72 hours.

## 2022-06-08 ENCOUNTER — Other Ambulatory Visit: Payer: Self-pay

## 2022-06-08 ENCOUNTER — Ambulatory Visit
Admission: RE | Admit: 2022-06-08 | Discharge: 2022-06-08 | Disposition: A | Discharge status: Home / Self Care | Payer: Medicare Other | Source: Ambulatory Visit | Attending: Interventional Radiology | Admitting: Interventional Radiology

## 2022-06-08 DIAGNOSIS — D696 Thrombocytopenia, unspecified: Secondary | ICD-10-CM

## 2022-06-08 DIAGNOSIS — R188 Other ascites: Secondary | ICD-10-CM | POA: Diagnosis not present

## 2022-06-08 DIAGNOSIS — E610 Copper deficiency: Secondary | ICD-10-CM

## 2022-06-08 DIAGNOSIS — N289 Disorder of kidney and ureter, unspecified: Secondary | ICD-10-CM | POA: Diagnosis not present

## 2022-06-08 DIAGNOSIS — K7581 Nonalcoholic steatohepatitis (NASH): Secondary | ICD-10-CM

## 2022-06-08 DIAGNOSIS — D649 Anemia, unspecified: Secondary | ICD-10-CM

## 2022-06-08 HISTORY — PX: IR RADIOLOGIST EVAL & MGMT: IMG5224

## 2022-06-08 LAB — CYTOLOGY - NON PAP

## 2022-06-08 NOTE — Progress Notes (Signed)
Referring Physician(s): Manus Rudd, MD   Reason for follow up:  Virtual telephone visit status post TIPS creation on 02/14/22 and TIPS revision on 05/09/22   History of present illness: Monterio Tellechea. is a 67 y.o. male with history of NASH cirrhosis (Child Pugh B, MELD 15) with recurrent ascites and renal insufficiency preventing use of diuretics status post TIPS creation on 02/14/22.  He presents today via virtual telephone visit accompanied by his wife.   He was hospitalized at [facility] from 3/19 to 3/21 with concern for SPB which was negative thankfully.  He was started on torsemide 10 mg for 2 weeks, now off, and continues to take spironolactone 25 mg QD.  Since discharge, he reports significant improvement.  He has had 5 paracenteses since TIPS revision, however his last paracentesis on 4/1 yielded 3.8 L.  He typically is restricted to 4 L, with additional fluid remaining at the end of the paracentesis.  He has lost weight, and reports being much more active.  He recently met with Hematology/Oncology and was started on iron supplementation to help manage his thrombocytopenia which is mild.  He is remaining compliant with low sodium diet.  He is also compliant with lactulose, reporting no hepatic encephalopathy.  He is still waiting to get financial assistance with rifaximin.      Past Medical History:  Diagnosis Date   ADD (attention deficit disorder)    ADHD    Anxiety 1987   mild (crowds, noises)   Anxiety    Attention deficit disorder    Cancer    Cirrhosis of liver    CVA (cerebral vascular accident)    Depression    Diabetic neuropathy    Essential hypertension, benign    Long-standing history, negative secondary workup   Headache    History of cardiac catheterization    Widely patent coronary and renal arteries November 2013   Hyperlipidemia    Hypertension    whitecoat   Obstructive sleep apnea    cpap   Prediabetes    Sleep apnea    TIA (transient ischemic attack)     Possible, 2007   Type 2 diabetes mellitus    Venous stasis     Past Surgical History:  Procedure Laterality Date   ANTERIOR CERVICAL DECOMP/DISCECTOMY FUSION N/A 12/30/2015   Procedure: ANTERIOR CERVICAL DECOMPRESSION/DISCECTOMY FUSION C6 - C7 1 LEVEL;  Surgeon: Melina Schools, MD;  Location: [facility];  Service: Orthopedics;  Laterality: N/A;   CARDIAC CATHETERIZATION  01/2012   negative   CARPAL TUNNEL RELEASE     CERVICAL SPINE NERVE BLOCK  05/17/2017   IR INTRAVASCULAR ULTRASOUND NON CORONARY  02/14/2022   IR PARACENTESIS  02/14/2022   IR PARACENTESIS  05/09/2022   IR RADIOLOGIST EVAL & MGMT  12/03/2021   IR RADIOLOGIST EVAL & MGMT  01/13/2022   IR RADIOLOGIST EVAL & MGMT  03/24/2022   IR RADIOLOGIST EVAL & MGMT  04/20/2022   IR TIPS  02/14/2022   IR TIPS REVISION MOD SED  05/09/2022   IR US GUIDE VASC ACCESS RIGHT  02/14/2022   IR US GUIDE VASC ACCESS RIGHT  02/14/2022   IR US GUIDE VASC ACCESS RIGHT  05/09/2022   KNEE SURGERY Right    NECK SURGERY     RADIOLOGY WITH ANESTHESIA N/A 02/14/2022   Procedure: TIPS;  Surgeon: Suzette Battiest, MD;  Location: [facility];  Service: Radiology;  Laterality: N/A;   VASECTOMY      Allergies: Patient has  no known allergies.  Medications: Prior to Admission medications   Medication Sig Start Date End Date Taking? Authorizing Provider  ascorbic Acid (VITAMIN C) 500 MG CPCR Take 500 mg by mouth daily.    [provider]  aspirin EC 81 MG tablet Take 81 mg by mouth daily. Swallow whole.    [provider]  Blood Glucose Monitoring Suppl DEVI 1 each by Does not apply route in the morning, at noon, and at bedtime. May substitute to any manufacturer covered by patient's insurance Dx E11.22 05/12/22   [name], MD  Cholecalciferol (D3 VITAMIN PO) Take by mouth daily. 5000 IU daily    [provider]  Copper 5 MG TABS Take 1 tablet (5 mg total) by mouth 2 (two) times daily for 7 days, THEN 1 tablet (5 mg total) daily. 06/07/22  07/14/22  Harriett Rush, PA-C  dorzolamide (TRUSOPT) 2 % ophthalmic solution 1 drop 2 (two) times daily. 04/04/22   [provider]  fluticasone (FLONASE) 50 MCG/ACT nasal spray Place 2 sprays into both nostrils daily. 11/29/21   [name], MD  Glucose Blood (BLOOD GLUCOSE TEST STRIPS) STRP Use to check blood sugar once daily before meals. May substitute to any manufacturer covered by patient's insurance E11.22 05/12/22   [name], MD  lactulose (CHRONULAC) 10 GM/15ML solution Take 30 mLs (20 g total) by mouth 3 (three) times daily. 06/01/22 06/01/23  Erenest Rasher, PA-C  Lancet Device MISC Use to check blood sugar once daily before meals. May substitute to any manufacturer covered by patient's insurance E11.22 05/12/22   [name], MD  Lancets Misc. MISC Use to check blood sugar once daily before meals. May substitute to any manufacturer covered by patient's insurance 05/12/22   [name], MD  metFORMIN (GLUCOPHAGE) 500 MG tablet 1 qd Patient taking differently: Take 500 mg by mouth daily with breakfast. 11/01/21   [name], MD  Milk Thistle 1000 MG CAPS Take by mouth daily.    [provider]  Omega-3 Fatty Acids (FISH OIL) 1000 MG CAPS Take by mouth daily.    [provider]  pantoprazole (PROTONIX) 40 MG tablet Take 1 tablet (40 mg total) by mouth daily. 11/01/21   [name], MD  rifaximin (XIFAXAN) 550 MG TABS tablet Take 1 tablet (550 mg total) by mouth 2 (two) times daily. Patient not taking: Reported on 06/07/2022 04/13/22   Daneil Dolin, MD  sodium bicarbonate 650 MG tablet Take 650 mg by mouth 3 (three) times daily. Morning, evening, bedtime    [provider]  spironolactone (ALDACTONE) 25 MG tablet Take 25 mg by mouth daily. 05/23/22 05/23/23  [provider]  torsemide (DEMADEX) 10 MG tablet Take 10 mg by mouth every other day. For two weeks 05/23/22 05/23/23  [provider]  venlafaxine XR  (EFFEXOR-XR) 150 MG 24 hr capsule Take 1 capsule (150 mg total) by mouth 2 (two) times daily. 11/01/21   [name], MD  vitamin E 180 MG (400 UNITS) capsule Take 400 Units by mouth daily.    [provider]  zinc gluconate 50 MG tablet Take 50 mg by mouth daily.    [provider]     Family History  Problem Relation Age of Onset   Colon cancer Mother    Hypertension Mother    CAD Mother    Breast cancer Mother    Cirrhosis Brother     Social  History   Socioeconomic History   Marital status: Married    Spouse name: Not on file   Number of children: 4   Years of education: Not on file   Highest education level: Not on file  Occupational History   Occupation: maintainer   Tobacco Use   Smoking status: Never   Smokeless tobacco: Current    Types: Snuff   Tobacco comments:    Uses dip pouches occasionally  Vaping Use   Vaping Use: Never used  Substance and Sexual Activity   Alcohol use: No    Alcohol/week: 0.0 standard drinks of alcohol    Comment: Previously 1-2 cases of beer/week. patient states never on regular basis though. last etoh in 2013.    Drug use: No   Sexual activity: Not Currently  Other Topics Concern   Not on file  Social History Narrative   ** Merged History Encounter **       Social Determinants of Health   Financial Resource Strain: Low Risk  (01/04/2022)   Overall Financial Resource Strain (CARDIA)    Difficulty of Paying Living Expenses: Not hard at all  Food Insecurity: No Food Insecurity (05/24/2022)   Hunger Vital Sign    Worried About Running Out of Food in the Last Year: Never true    Ran Out of Food in the Last Year: Never true  Transportation Needs: No Transportation Needs (05/24/2022)   PRAPARE - Hydrologist (Medical): No    Lack of Transportation (Non-Medical): No  Physical Activity: Inactive (01/04/2022)   Exercise Vital Sign    Days of Exercise per Week: 0 days    Minutes of  Exercise per Session: 0 min  Stress: No Stress Concern Present (01/04/2022)   [place]    Feeling of Stress : Only a little  Social Connections: Moderately Integrated (01/04/2022)   Social Connection and Isolation Panel [NHANES]    Frequency of Communication with Friends and Family: More than three times a week    Frequency of Social Gatherings with Friends and Family: Once a week    Attends Religious Services: More than 4 times per year    Active Member of [employer] or Organizations: No    Attends Archivist Meetings: Never    Marital Status: Married     Vital Signs: There were no vitals taken for this visit.  No physical examination was performed in lieu of virtual telephone clinic visit.   Imaging: TIPS 02/14/22  7+2 Viatorr, no post deployment balloon molding. Gradient from 22-->9  TIPS revision 05/09/22  Balloon expansion to 10 mm Gradient from 18 --> 8  Labs:  CBC: Recent Labs    05/18/22 1409 05/24/22 1127 05/25/22 0447 05/25/22 1042 05/26/22 0438  WBC 7.2 5.8 4.4  --  5.2  HGB 10.2* 10.0* 8.4* 8.7* 8.7*  HCT 29.8* 28.8* 25.0* 26.4* 25.9*  PLT 133* 121* 110*  --  128*    COAGS: Recent Labs    05/09/22 1102 05/24/22 1127 05/25/22 0447 05/26/22 0438  INR 1.4* 1.5* 1.8* 1.7*    BMP: Recent Labs    05/18/22 1410 05/24/22 1127 05/25/22 0447 05/26/22 0438  NA 139 139 141 142  K 3.3* 3.6 2.9* 3.6  CL 110 108 109 110  CO2 23 25 24 26   GLUCOSE 85 114* 121* 121*  BUN 19 19 20 21   CALCIUM 8.4* 8.4* 8.4* 8.7*  CREATININE 1.30* 1.38* 1.24 1.29*  GFRNONAA >60 57* >60 >60    LIVER FUNCTION TESTS: Recent Labs    05/09/22 1102 05/18/22 1410 05/24/22 1127 05/25/22 0447 05/26/22 0438  BILITOT 2.1*  --  1.7* 1.9* 1.9*  AST 56*  --  57* 44* 47*  ALT 28  --  31 24 24   ALKPHOS 138*  --  144* 112 105  PROT 5.6*  --  5.3* 5.3* 5.5*  ALBUMIN 2.1* 2.1* 2.3* 3.0* 3.1*     Assessment and Plan: Taquan Stavropoulos. is a 67 y.o. male with history of NASH cirrhosis (Child Pugh B9, MELD 15 pre TIPS -->1 7 currently) with recurrent ascites and renal insufficiency preventing use of diuretics status post TIPS creation on 02/14/22 and TIPS revision on 05/09/22 due to persistent ascites.  Despite 5 paracenteses since TIPS revision, he reports feeling much better with weight loss and less ascites production.  I am hopeful this will continue over the coming months.  Agree with continuing diuretics as tolerated by renal function.    If he continues to have significant ascites production, partial splenic embolization could be considered to aid in decreased portal hypertension as well as improve his thrombocytopenia.   We will plan to follow up in IR clinic in 2 months to assess progress.  Ruthann Cancer, MD Pager: [phone] Clinic: [phone]    I spent a total of 25 Minutes in virtual telephone clinical consultation, greater than 50% of which was counseling/coordinating care for portal hypertension.

## 2022-06-09 ENCOUNTER — Inpatient Hospital Stay (HOSPITAL_COMMUNITY): Admission: RE | Admit: 2022-06-09 | Payer: BLUE CROSS/BLUE SHIELD | Source: Ambulatory Visit

## 2022-06-11 LAB — CULTURE, BODY FLUID W GRAM STAIN -BOTTLE
Culture: NO GROWTH
Special Requests: ADEQUATE

## 2022-06-12 ENCOUNTER — Other Ambulatory Visit: Payer: Self-pay | Admitting: Physician Assistant

## 2022-06-12 DIAGNOSIS — E610 Copper deficiency: Secondary | ICD-10-CM

## 2022-06-13 ENCOUNTER — Ambulatory Visit (HOSPITAL_COMMUNITY): Payer: Medicare Other

## 2022-06-16 ENCOUNTER — Other Ambulatory Visit: Payer: Self-pay

## 2022-06-16 DIAGNOSIS — E610 Copper deficiency: Secondary | ICD-10-CM | POA: Diagnosis not present

## 2022-06-16 DIAGNOSIS — N182 Chronic kidney disease, stage 2 (mild): Secondary | ICD-10-CM | POA: Diagnosis not present

## 2022-06-16 DIAGNOSIS — D696 Thrombocytopenia, unspecified: Secondary | ICD-10-CM | POA: Diagnosis not present

## 2022-06-16 DIAGNOSIS — I129 Hypertensive chronic kidney disease with stage 1 through stage 4 chronic kidney disease, or unspecified chronic kidney disease: Secondary | ICD-10-CM | POA: Diagnosis not present

## 2022-06-16 DIAGNOSIS — D631 Anemia in chronic kidney disease: Secondary | ICD-10-CM | POA: Diagnosis not present

## 2022-06-16 DIAGNOSIS — E1122 Type 2 diabetes mellitus with diabetic chronic kidney disease: Secondary | ICD-10-CM | POA: Diagnosis not present

## 2022-06-17 ENCOUNTER — Ambulatory Visit (HOSPITAL_COMMUNITY)
Admission: RE | Admit: 2022-06-17 | Discharge: 2022-06-17 | Disposition: A | Discharge status: Home / Self Care | Payer: Medicare Other | Source: Ambulatory Visit | Attending: Gastroenterology | Admitting: Gastroenterology

## 2022-06-17 ENCOUNTER — Encounter (HOSPITAL_COMMUNITY): Payer: Self-pay

## 2022-06-17 DIAGNOSIS — K7460 Unspecified cirrhosis of liver: Secondary | ICD-10-CM | POA: Diagnosis not present

## 2022-06-17 DIAGNOSIS — R188 Other ascites: Secondary | ICD-10-CM | POA: Diagnosis not present

## 2022-06-17 LAB — BODY FLUID CELL COUNT WITH DIFFERENTIAL
Eos, Fluid: 1 %
Lymphs, Fluid: 25 %
Monocyte-Macrophage-Serous Fluid: 66 % (ref 50–90)
Neutrophil Count, Fluid: 8 % (ref 0–25)
Total Nucleated Cell Count, Fluid: 199 cu mm (ref 0–1000)

## 2022-06-17 LAB — GRAM STAIN

## 2022-06-17 LAB — CULTURE, BODY FLUID W GRAM STAIN -BOTTLE

## 2022-06-17 NOTE — Progress Notes (Signed)
Patient tolerated right sided paracentesis procedure well today and 4 Liters of yellow ascites removed with labs collected and sent for processing. Patient verbalized understanding of discharge instructions and ambulatory at departure with no acute distress noted.

## 2022-06-19 NOTE — Progress Notes (Unsigned)
Referring Provider: Babs Sciara, MD Primary Care Physician:  Babs Sciara, MD Primary GI Physician: Dr. Jena Gauss  Chief Complaint  Patient presents with   Follow-up    Follow up. No problems.     HPI:   Shawn [name]. is a 67 y.o. male  with a history of GERD, cirrhosis felt to be related to NASH +/- alcohol with history of intermittent heavy alcohol drinking in the past, abstinent since December 2013. He also has a history of elevated AFP, no HCC on MRI.   In 2021 he had decompensation with ascites and lower extremity edema. First paracentesis at that time with no evidence of SBP, cytology negative. Started requiring recurrent paras in August 2023. TIPS placement 02/16/22, but continued with frequent paras as he developed renal insufficiency and has been intolerant to diuretics.  He underwent TIPS balloon expansion 05/09/2022.  Ultimately admitted to the hospital in March 2024 with concern for SBP in the setting of positive Gram stain, culture with Streptococcus salivarius, but PMNs less than 250.  It was felt that this was a contaminant, thus no recommendations for SBP prophylaxis.   Also with hepatic encephalopathy, on Lactulose. Has is established with with Annamarie Major at [facility] in [place].   He is presenting today for follow-up.   Today:  Cirrhosis: MELD: MELD 3.0, 17 on 05/26/22.  [place]: 05/17/22- no focal liver lesion.  AFP: Normal November 2023.  Hep A/B vaccination: Immune   EGD: Never. This has been on hold in the setting of significant ascites undergoing TIPS/balloon expansion. Ready to schedule.  Ascites/peripheral edema:  Continues to have ascites and peripheral edema into his thighs, but states he can handle it now, not as bad as it was. Not having to have paracentesis quite as frequent. Down to having paracentesis every 1.5 weeks or so rather than weekly or less. Last paracentesis 4/12.  Diuretics:  He has established with nephrology to help with  diuretic management. He was started on spironolactone and torsemide in March.  He has not sure of the current dose that he is taking.  States he takes 1 pill every other day and 1 pill daily.  He plans to discuss possibly taking his medications every day as he continues to have quite a bit of excess fluid.  Looks like he has follow-up with nephrology tomorrow. Following a low sodium diet.  Encephalopathy:   None.  Taking lactulose 30 mL 3 times daily.  Having 4-5 bowel movements daily.  No BRBPR or melena.  Has been approved for Xifaxan patient assistance.  Received notification that he should be getting Xifaxan in the mail in the next couple of days.   Appt with Annamarie Major, NP 07/04/22.    GERD: Well controlled. No dysphagia.   Anemia:  New onset anemia with hemoglobin 10.8 on 04/11/2022. This is down from 14.2 in December 2023.  Most recent hemoglobin 10.0 on 05/26/2022.  No evidence of iron deficiency, B12 deficiency, folate deficiency. He established with hematology for normocytic anemia and thrombocytopenia on 06/07/2022. He was found to have low copper and has been started on supplementation.  No brbpr or melena.  Hemoccult negative x 3 in March 2024. Takes 81 mg aspirin. No other NSAIDs.      Past Medical History:  Diagnosis Date   ADD (attention deficit disorder)    ADHD    Anxiety 1987   mild (crowds, noises)   Anxiety    Attention deficit disorder  Cancer    Cirrhosis of liver    CVA (cerebral vascular accident)    Depression    Diabetic neuropathy    Essential hypertension, benign    Long-standing history, negative secondary workup   Headache    History of cardiac catheterization    Widely patent coronary and renal arteries November 2013   Hyperlipidemia    Hypertension    whitecoat   Obstructive sleep apnea    cpap   Prediabetes    Sleep apnea    TIA (transient ischemic attack)    Possible, 2007   Type 2 diabetes mellitus    Venous stasis     Past Surgical  History:  Procedure Laterality Date   ANTERIOR CERVICAL DECOMP/DISCECTOMY FUSION N/A 12/30/2015   Procedure: ANTERIOR CERVICAL DECOMPRESSION/DISCECTOMY FUSION C6 - C7 1 LEVEL;  Surgeon: Venita Lick, MD;  Location: [facility] OR;  Service: Orthopedics;  Laterality: N/A;   CARDIAC CATHETERIZATION  01/2012   negative   CARPAL TUNNEL RELEASE     CERVICAL SPINE NERVE BLOCK  05/17/2017   IR INTRAVASCULAR ULTRASOUND NON CORONARY  02/14/2022   IR PARACENTESIS  02/14/2022   IR PARACENTESIS  05/09/2022   IR RADIOLOGIST EVAL & MGMT  12/03/2021   IR RADIOLOGIST EVAL & MGMT  01/13/2022   IR RADIOLOGIST EVAL & MGMT  03/24/2022   IR RADIOLOGIST EVAL & MGMT  04/20/2022   IR RADIOLOGIST EVAL & MGMT  06/08/2022   IR TIPS  02/14/2022   IR TIPS REVISION MOD SED  05/09/2022   IR US GUIDE VASC ACCESS RIGHT  02/14/2022   IR US GUIDE VASC ACCESS RIGHT  02/14/2022   IR US GUIDE VASC ACCESS RIGHT  05/09/2022   KNEE SURGERY Right    NECK SURGERY     RADIOLOGY WITH ANESTHESIA N/A 02/14/2022   Procedure: TIPS;  Surgeon: Bennie Dallas, MD;  Location: [facility] OR;  Service: Radiology;  Laterality: N/A;   VASECTOMY      Current Outpatient Medications  Medication Sig Dispense Refill   ascorbic Acid (VITAMIN C) 500 MG CPCR Take 500 mg by mouth daily.     aspirin EC 81 MG tablet Take 81 mg by mouth daily. Swallow whole.     Blood Glucose Monitoring Suppl DEVI 1 each by Does not apply route in the morning, at noon, and at bedtime. May substitute to any manufacturer covered by patient's insurance Dx E11.22 1 each 0   Cholecalciferol (D3 VITAMIN PO) Take by mouth daily. 5000 IU daily     Copper Gluconate 2 MG TABS TAKE 2 AND 1/2 TABLETS (5 MG TOTAL) BY MOUTH 2 (TWO) TIMES DAILY FOR 7 DAYS, THEN 2 AND 1/2 TABLETS (5 MG TOTAL) DAILY. 70 tablet 0   dorzolamide (TRUSOPT) 2 % ophthalmic solution 1 drop 2 (two) times daily.     fluticasone (FLONASE) 50 MCG/ACT nasal spray SPRAY 2 SPRAYS INTO EACH NOSTRIL EVERY DAY 48 mL 0   Glucose Blood (BLOOD  GLUCOSE TEST STRIPS) STRP Use to check blood sugar once daily before meals. May substitute to any manufacturer covered by patient's insurance E11.22 100 strip 3   Lancet Device MISC Use to check blood sugar once daily before meals. May substitute to any manufacturer covered by patient's insurance E11.22 100 each 3   Lancets Misc. MISC Use to check blood sugar once daily before meals. May substitute to any manufacturer covered by patient's insurance 100 each 1   metFORMIN (GLUCOPHAGE) 500 MG tablet TAKE 1 TABLET BY MOUTH EVERY  DAY 90 tablet 0   Milk Thistle 1000 MG CAPS Take by mouth daily.     Omega-3 Fatty Acids (FISH OIL) 1000 MG CAPS Take by mouth daily.     OVER THE COUNTER MEDICATION Copper supplement     pantoprazole (PROTONIX) 40 MG tablet Take 1 tablet (40 mg total) by mouth daily. 90 tablet 1   sodium bicarbonate 650 MG tablet Take 650 mg by mouth 3 (three) times daily. Morning, evening, bedtime     spironolactone (ALDACTONE) 25 MG tablet Take 25 mg by mouth daily.     torsemide (DEMADEX) 10 MG tablet Take 10 mg by mouth every other day. For two weeks     venlafaxine XR (EFFEXOR-XR) 150 MG 24 hr capsule Take 1 capsule (150 mg total) by mouth 2 (two) times daily. 180 capsule 1   vitamin E 180 MG (400 UNITS) capsule Take 400 Units by mouth daily.     zinc gluconate 50 MG tablet Take 50 mg by mouth daily.     lactulose (CHRONULAC) 10 GM/15ML solution Take 30 mLs (20 g total) by mouth 3 (three) times daily. 2700 mL 5   rifaximin (XIFAXAN) 550 MG TABS tablet Take 1 tablet (550 mg total) by mouth 2 (two) times daily. (Patient not taking: Reported on 06/22/2022) 60 tablet 11   No current facility-administered medications for this visit.    Allergies as of 06/22/2022   (No Known Allergies)    Family History  Problem Relation Age of Onset   Colon cancer Mother    Hypertension Mother    CAD Mother    Breast cancer Mother    Cirrhosis Brother     Social History   Socioeconomic History    Marital status: Married    Spouse name: Not on file   Number of children: 4   Years of education: Not on file   Highest education level: Not on file  Occupational History   Occupation: Systems developer   Tobacco Use   Smoking status: Never   Smokeless tobacco: Current    Types: Snuff   Tobacco comments:    Uses dip pouches occasionally  Vaping Use   Vaping Use: Never used  Substance and Sexual Activity   Alcohol use: No    Alcohol/week: 0.0 standard drinks of alcohol    Comment: Previously 1-2 cases of beer/week. patient states never on regular basis though. last etoh in 2013.    Drug use: No   Sexual activity: Not Currently  Other Topics Concern   Not on file  Social History Narrative   ** Merged History Encounter **       Social Determinants of Health   Financial Resource Strain: Low Risk  (01/04/2022)   Overall Financial Resource Strain (CARDIA)    Difficulty of Paying Living Expenses: Not hard at all  Food Insecurity: No Food Insecurity (05/24/2022)   Hunger Vital Sign    Worried About Running Out of Food in the Last Year: Never true    Ran Out of Food in the Last Year: Never true  Transportation Needs: No Transportation Needs (05/24/2022)   PRAPARE - Administrator, Civil Service (Medical): No    Lack of Transportation (Non-Medical): No  Physical Activity: Inactive (01/04/2022)   Exercise Vital Sign    Days of Exercise per Week: 0 days    Minutes of Exercise per Session: 0 min  Stress: No Stress Concern Present (01/04/2022)   [employer] of Occupational Health - Occupational Stress  Questionnaire    Feeling of Stress : Only a little  Social Connections: Moderately Integrated (01/04/2022)   Social Connection and Isolation Panel [NHANES]    Frequency of Communication with Friends and Family: More than three times a week    Frequency of Social Gatherings with Friends and Family: Once a week    Attends Religious Services: More than 4 times per year     Active Member of [employer] or Organizations: No    Attends Banker Meetings: Never    Marital Status: Married    Review of Systems: Gen: Denies fever, chills, cold or flulike symptoms, syncope, syncope. CV: Denies chest pain, palpitations. Resp: Denies dyspnea, cough. GI: See HPI Heme: See HPI  Physical Exam: BP 121/74 (BP Location: Right Arm, Patient Position: Sitting, Cuff Size: Normal)   Pulse 89   Temp 98.4 F (36.9 C) (Temporal)   Ht 5\' 5"  (1.651 m)   Wt 214 lb 9.6 oz (97.3 kg)   SpO2 100%   BMI 35.71 kg/m  General:   Alert and oriented. No distress noted. Pleasant and cooperative.  Head:  Normocephalic and atraumatic. Eyes:  Conjuctiva clear without scleral icterus. Heart:  S1, S2 present without murmurs appreciated. Lungs:  Clear to auscultation bilaterally. No wheezes, rales, or rhonchi. No distress.  Abdomen:  +BS, distended with ascites, but soft and non-tender. No rebound or guarding.  Msk:  Symmetrical without gross deformities. Normal posture. Extremities:  With 2 pitting edema into the thighs. Neurologic:  Alert and  oriented x4 Psych:  Normal mood and affect.    Assessment:  67 year old male with history of GERD, decompensated cirrhosis felt to be related to NASH +/- alcohol use previously, presenting today for follow-up.   Cirrhosis: Secondary to NASH +/- alcohol.  Abstinent since December 2013. MELD 3.0, 17 in March. [place] up to date with no focal liver lesion. Immune to Hep A/B. No prior EGD.   Cirrhosis has been complicated by recurrent ascites/peripheral edema and hepatic encephalopathy. Also with thrombocytopenia. First paracentesis required in 2021, but began requiring recurrent paras in August 2023 s/p TIPS in December 2023, but continued requiring frequent paras, up to twice weekly, as he developed renal insufficiency and was intolerant to diuretics. He underwent TIPS balloon expansion 05/09/2022 with some improvement as he has decreased frequency  of paras to every 1.5 weeks or so.  Continues with 2+ pitting edema into his thighs.  He has established with nephrology and is currently taking torsemide and spironolactone though unclear what dose patient is currently taking.  Appears he has follow-up with nephrology tomorrow.   Regarding his ascites and recurrent paracentesis, he was admitted in March 2023 with concern for SBP in the setting of positive Gram stain, culture with Streptococcus salivarius, but PMNs less than 250. It was felt that this was a contaminant, thus no recommendations for SBP prophylaxis.   His encephalopathy has been well controlled on Lactulose 30 ml TID and has just been approved for patient assistance for Xifaxan and should be starting this in the next couple of days.   Regarding esophageal variceal screening, we will schedule him for an EGD with Dr. Jena Gauss.   He also has an upcoming appointment with Annamarie Major, NP for transplant evaluation.   GERD:  Well-controlled on pantoprazole 40 mg daily.  Colon cancer screening:  Needs first-ever screening colonoscopy.  Mother with history of colon cancer. He has no significant lower GI symptoms or alarm symptoms.  He was found to have new  onset anemia in February 2024, but no evidence of iron deficiency, no overt GI bleeding, and Hemoccult negative x 3.  He is following with hematology.   Plan:  Proceed with upper endoscopy + TCS with propofol by Dr. Jena Gauss in near future. The risks, benefits, and alternatives have been discussed with the patient in detail. The patient states understanding and desires to proceed.  ASA 3 Continue lactulose 30 mL 3 times daily. Xifaxan 550 mg twice daily.  Patient will be receiving this in the mail soon as he has been approved for patient assistance. Requested patient to call and let us know what dose of torsemide and spironolactone he is taking. Continue to follow closely with nephrology for diuretic management. Keep upcoming appointment  with Annamarie Major, NP for transplant evaluation. Nutritional recommendations: Continue pantoprazole 40 mg daily. Continue to follow closely with hematology for anemia. Follow-up in our office in 2 to 3 months (after procedures).      Ermalinda Memos, PA-C [facility] 06/22/2022

## 2022-06-20 ENCOUNTER — Other Ambulatory Visit: Payer: Self-pay | Admitting: Family Medicine

## 2022-06-20 LAB — UPEP/UIFE/LIGHT CHAINS/TP, 24-HR UR
% BETA, Urine: 39.1 %
ALPHA 1 URINE: 6.3 %
Albumin, U: 20.4 %
Alpha 2, Urine: 12.7 %
Free Kappa Lt Chains,Ur: 54.99 mg/L (ref 1.17–86.46)
Free Kappa/Lambda Ratio: 5.52 (ref 1.83–14.26)
Free Lambda Lt Chains,Ur: 9.96 mg/L (ref 0.27–15.21)
GAMMA GLOBULIN URINE: 21.4 %
Total Protein, Urine-Ur/day: 241 mg/24 hr — ABNORMAL HIGH (ref 30–150)
Total Protein, Urine: 14.6 mg/dL
Total Volume: 1650

## 2022-06-20 LAB — CULTURE, BODY FLUID W GRAM STAIN -BOTTLE: Culture: NO GROWTH

## 2022-06-20 LAB — CYTOLOGY - NON PAP

## 2022-06-21 ENCOUNTER — Inpatient Hospital Stay: Payer: Medicare Other

## 2022-06-21 ENCOUNTER — Telehealth: Payer: Self-pay | Admitting: *Deleted

## 2022-06-21 DIAGNOSIS — D696 Thrombocytopenia, unspecified: Secondary | ICD-10-CM

## 2022-06-21 DIAGNOSIS — I129 Hypertensive chronic kidney disease with stage 1 through stage 4 chronic kidney disease, or unspecified chronic kidney disease: Secondary | ICD-10-CM | POA: Diagnosis not present

## 2022-06-21 DIAGNOSIS — E610 Copper deficiency: Secondary | ICD-10-CM | POA: Diagnosis not present

## 2022-06-21 DIAGNOSIS — E1122 Type 2 diabetes mellitus with diabetic chronic kidney disease: Secondary | ICD-10-CM | POA: Diagnosis not present

## 2022-06-21 DIAGNOSIS — D631 Anemia in chronic kidney disease: Secondary | ICD-10-CM | POA: Diagnosis not present

## 2022-06-21 DIAGNOSIS — D649 Anemia, unspecified: Secondary | ICD-10-CM

## 2022-06-21 DIAGNOSIS — N182 Chronic kidney disease, stage 2 (mild): Secondary | ICD-10-CM | POA: Diagnosis not present

## 2022-06-21 LAB — CBC WITH DIFFERENTIAL/PLATELET
Abs Immature Granulocytes: 0.02 10*3/uL (ref 0.00–0.07)
Basophils Absolute: 0.1 10*3/uL (ref 0.0–0.1)
Basophils Relative: 1 %
Eosinophils Absolute: 1 10*3/uL — ABNORMAL HIGH (ref 0.0–0.5)
Eosinophils Relative: 16 %
HCT: 29.7 % — ABNORMAL LOW (ref 39.0–52.0)
Hemoglobin: 10 g/dL — ABNORMAL LOW (ref 13.0–17.0)
Immature Granulocytes: 0 %
Lymphocytes Relative: 12 %
Lymphs Abs: 0.8 10*3/uL (ref 0.7–4.0)
MCH: 32.9 pg (ref 26.0–34.0)
MCHC: 33.7 g/dL (ref 30.0–36.0)
MCV: 97.7 fL (ref 80.0–100.0)
Monocytes Absolute: 0.8 10*3/uL (ref 0.1–1.0)
Monocytes Relative: 12 %
Neutro Abs: 3.8 10*3/uL (ref 1.7–7.7)
Neutrophils Relative %: 59 %
Platelets: 135 10*3/uL — ABNORMAL LOW (ref 150–400)
RBC: 3.04 MIL/uL — ABNORMAL LOW (ref 4.22–5.81)
RDW: 15.5 % (ref 11.5–15.5)
WBC: 6.5 10*3/uL (ref 4.0–10.5)
nRBC: 0 % (ref 0.0–0.2)

## 2022-06-21 LAB — SAMPLE TO BLOOD BANK

## 2022-06-21 NOTE — Telephone Encounter (Signed)
Fantastic!  ?

## 2022-06-21 NOTE — Telephone Encounter (Signed)
Received approval letter for Xifaxan. Informed pt. Sent copy to scan center.   

## 2022-06-22 ENCOUNTER — Telehealth: Payer: Self-pay | Admitting: *Deleted

## 2022-06-22 ENCOUNTER — Encounter: Payer: Self-pay | Admitting: Gastroenterology

## 2022-06-22 ENCOUNTER — Ambulatory Visit (INDEPENDENT_AMBULATORY_CARE_PROVIDER_SITE_OTHER): Payer: Medicare Other | Admitting: Gastroenterology

## 2022-06-22 VITALS — BP 121/74 | HR 89 | Temp 98.4°F | Ht 65.0 in | Wt 214.6 lb

## 2022-06-22 DIAGNOSIS — R188 Other ascites: Secondary | ICD-10-CM

## 2022-06-22 DIAGNOSIS — K7460 Unspecified cirrhosis of liver: Secondary | ICD-10-CM | POA: Diagnosis not present

## 2022-06-22 DIAGNOSIS — K219 Gastro-esophageal reflux disease without esophagitis: Secondary | ICD-10-CM

## 2022-06-22 DIAGNOSIS — K7682 Hepatic encephalopathy: Secondary | ICD-10-CM | POA: Diagnosis not present

## 2022-06-22 DIAGNOSIS — Z1211 Encounter for screening for malignant neoplasm of colon: Secondary | ICD-10-CM | POA: Diagnosis not present

## 2022-06-22 LAB — CULTURE, BODY FLUID W GRAM STAIN -BOTTLE

## 2022-06-22 MED ORDER — LACTULOSE 10 GM/15ML PO SOLN
20.0000 g | Freq: Three times a day (TID) | ORAL | 5 refills | Status: DC
Start: 2022-06-22 — End: 2022-09-27

## 2022-06-22 NOTE — Telephone Encounter (Signed)
Called pt to schedule his TCS/EGD with Dr. Jena Gauss, ASA 3. He needs his wife to schedule it and will have her call to get this scheduled

## 2022-06-22 NOTE — Patient Instructions (Addendum)
We will arrange to have an upper endoscopy and colonoscopy in the near future with Dr. Jena Gauss.  It looks like you have an appointment with your nephrologist tomorrow.  Please reach out to them to verify this.  Please call when you get home to let me know what dose of torsemide and spironolactone you are taking.  Continue lactulose 30 mL 3 times daily.  Start Xifaxan 550 mg twice daily when you receive this in the mail.  Keep your upcoming appointment with Annamarie Major, NP.  Nutrition recommendations:  High-protein diet from a primarily plant-based diet. Avoid red meat.  No raw or undercooked meat, seafood, or shellfish. Low-fat/cholesterol/carbohydrate diet. Limit sodium to no more than 2000 mg/day including everything that you eat and drink. Recommend at least 30 minutes of aerobic and resistance exercise 3 days/week.  Continue pantoprazole 40 mg daily for reflux.  Continue to follow closely with hematology for anemia.  We will plan to see you back in about 2-3 months.  Do not hesitate to call sooner if you have questions or concerns.  Ermalinda Memos, PA-C [facility]

## 2022-06-23 ENCOUNTER — Ambulatory Visit (HOSPITAL_COMMUNITY): Admission: RE | Admit: 2022-06-23 | Payer: Medicare Other | Source: Ambulatory Visit

## 2022-06-27 ENCOUNTER — Ambulatory Visit (HOSPITAL_COMMUNITY)
Admission: RE | Admit: 2022-06-27 | Discharge: 2022-06-27 | Disposition: A | Discharge status: Home / Self Care | Payer: Medicare Other | Source: Ambulatory Visit | Attending: Gastroenterology | Admitting: Gastroenterology

## 2022-06-27 ENCOUNTER — Encounter (HOSPITAL_COMMUNITY): Payer: Self-pay

## 2022-06-27 DIAGNOSIS — R188 Other ascites: Secondary | ICD-10-CM | POA: Diagnosis not present

## 2022-06-27 DIAGNOSIS — K7469 Other cirrhosis of liver: Secondary | ICD-10-CM | POA: Diagnosis not present

## 2022-06-27 LAB — BODY FLUID CELL COUNT WITH DIFFERENTIAL
Eos, Fluid: 0 %
Lymphs, Fluid: 28 %
Monocyte-Macrophage-Serous Fluid: 59 % (ref 50–90)
Neutrophil Count, Fluid: 13 % (ref 0–25)
Total Nucleated Cell Count, Fluid: 210 cu mm (ref 0–1000)

## 2022-06-27 LAB — GRAM STAIN: Gram Stain: NONE SEEN

## 2022-06-27 NOTE — Progress Notes (Signed)
Patient tolerated right sided paracentesis well today and 4 Liters of yellow ascites removed with labs collected and sent for processing. Patient verbalized understanding of discharge instructions and ambulatory at departure with no acute distress noted.

## 2022-06-27 NOTE — Procedures (Signed)
PROCEDURE SUMMARY:  Successful image-guided paracentesis from the right lower abdomen.  Yielded 4.0 liters of hazy yellow fluid which was the requested maximum. Residual fluid remains on post procedure [place]. No immediate complications.  EBL < 1 mL Patient tolerated well.   Specimen was sent for labs.  Please see imaging section of Epic for full dictation.  Patient s/p TIPS revision 05/09/22 and is followed by portal HTN clinic.  Villa Herb PA-C 06/27/2022 9:14 AM

## 2022-06-30 ENCOUNTER — Ambulatory Visit (HOSPITAL_COMMUNITY)
Admission: RE | Admit: 2022-06-30 | Discharge: 2022-06-30 | Disposition: A | Discharge status: Home / Self Care | Payer: Medicare Other | Source: Ambulatory Visit | Attending: Gastroenterology | Admitting: Gastroenterology

## 2022-06-30 ENCOUNTER — Encounter (HOSPITAL_COMMUNITY): Payer: Self-pay

## 2022-06-30 DIAGNOSIS — R188 Other ascites: Secondary | ICD-10-CM | POA: Diagnosis not present

## 2022-06-30 LAB — BODY FLUID CELL COUNT WITH DIFFERENTIAL
Eos, Fluid: 0 %
Lymphs, Fluid: 34 %
Monocyte-Macrophage-Serous Fluid: 55 % (ref 50–90)
Neutrophil Count, Fluid: 11 % (ref 0–25)
Total Nucleated Cell Count, Fluid: 213 cu mm (ref 0–1000)

## 2022-06-30 LAB — GRAM STAIN

## 2022-06-30 LAB — CULTURE, BODY FLUID W GRAM STAIN -BOTTLE: Culture: NO GROWTH

## 2022-06-30 NOTE — Progress Notes (Signed)
Patient tolerated right sided paracentesis procedure well today and 4 Liters of ascites removed with labs collected and sent for processing. Patient verbalized understanding of discharge instructions and ambulatory at departure with no acute distress noted.

## 2022-06-30 NOTE — Procedures (Signed)
PROCEDURE SUMMARY:  Successful US guided paracentesis from right lateral abdomen.  Yielded 4.0 liters of clear, yellow fluid.  No immediate complications.  Pt tolerated well.   Specimen was sent for labs.  EBL < 5mL  Hoyt Koch PA-C 06/30/2022 10:09 AM

## 2022-07-01 LAB — CULTURE, BODY FLUID W GRAM STAIN -BOTTLE

## 2022-07-02 LAB — CULTURE, BODY FLUID W GRAM STAIN -BOTTLE

## 2022-07-03 LAB — CULTURE, BODY FLUID W GRAM STAIN -BOTTLE: Culture: NO GROWTH

## 2022-07-04 DIAGNOSIS — K7460 Unspecified cirrhosis of liver: Secondary | ICD-10-CM | POA: Diagnosis not present

## 2022-07-04 DIAGNOSIS — K7682 Hepatic encephalopathy: Secondary | ICD-10-CM | POA: Diagnosis not present

## 2022-07-04 DIAGNOSIS — R188 Other ascites: Secondary | ICD-10-CM | POA: Diagnosis not present

## 2022-07-04 LAB — CULTURE, BODY FLUID W GRAM STAIN -BOTTLE

## 2022-07-05 ENCOUNTER — Inpatient Hospital Stay: Payer: Medicare Other

## 2022-07-05 ENCOUNTER — Ambulatory Visit (HOSPITAL_COMMUNITY): Admission: RE | Admit: 2022-07-05 | Payer: Medicare Other | Source: Ambulatory Visit

## 2022-07-05 DIAGNOSIS — D696 Thrombocytopenia, unspecified: Secondary | ICD-10-CM | POA: Diagnosis not present

## 2022-07-05 DIAGNOSIS — D649 Anemia, unspecified: Secondary | ICD-10-CM

## 2022-07-05 DIAGNOSIS — E610 Copper deficiency: Secondary | ICD-10-CM

## 2022-07-05 DIAGNOSIS — D631 Anemia in chronic kidney disease: Secondary | ICD-10-CM | POA: Diagnosis not present

## 2022-07-05 DIAGNOSIS — E1122 Type 2 diabetes mellitus with diabetic chronic kidney disease: Secondary | ICD-10-CM | POA: Diagnosis not present

## 2022-07-05 DIAGNOSIS — N182 Chronic kidney disease, stage 2 (mild): Secondary | ICD-10-CM | POA: Diagnosis not present

## 2022-07-05 DIAGNOSIS — I129 Hypertensive chronic kidney disease with stage 1 through stage 4 chronic kidney disease, or unspecified chronic kidney disease: Secondary | ICD-10-CM | POA: Diagnosis not present

## 2022-07-05 LAB — CBC WITH DIFFERENTIAL/PLATELET
Abs Immature Granulocytes: 0.03 10*3/uL (ref 0.00–0.07)
Basophils Absolute: 0.1 10*3/uL (ref 0.0–0.1)
Basophils Relative: 1 %
Eosinophils Absolute: 0.7 10*3/uL — ABNORMAL HIGH (ref 0.0–0.5)
Eosinophils Relative: 11 %
HCT: 32 % — ABNORMAL LOW (ref 39.0–52.0)
Hemoglobin: 10.6 g/dL — ABNORMAL LOW (ref 13.0–17.0)
Immature Granulocytes: 1 %
Lymphocytes Relative: 8 %
Lymphs Abs: 0.6 10*3/uL — ABNORMAL LOW (ref 0.7–4.0)
MCH: 32.4 pg (ref 26.0–34.0)
MCHC: 33.1 g/dL (ref 30.0–36.0)
MCV: 97.9 fL (ref 80.0–100.0)
Monocytes Absolute: 0.7 10*3/uL (ref 0.1–1.0)
Monocytes Relative: 11 %
Neutro Abs: 4.5 10*3/uL (ref 1.7–7.7)
Neutrophils Relative %: 68 %
Platelets: 177 10*3/uL (ref 150–400)
RBC: 3.27 MIL/uL — ABNORMAL LOW (ref 4.22–5.81)
RDW: 15.1 % (ref 11.5–15.5)
WBC: 6.6 10*3/uL (ref 4.0–10.5)
nRBC: 0 % (ref 0.0–0.2)

## 2022-07-05 LAB — IRON AND TIBC
Iron: 45 ug/dL (ref 45–182)
Saturation Ratios: 21 % (ref 17.9–39.5)
TIBC: 210 ug/dL — ABNORMAL LOW (ref 250–450)
UIBC: 165 ug/dL

## 2022-07-05 LAB — SAMPLE TO BLOOD BANK

## 2022-07-05 LAB — RETICULOCYTES
Immature Retic Fract: 24.1 % — ABNORMAL HIGH (ref 2.3–15.9)
RBC.: 3.28 MIL/uL — ABNORMAL LOW (ref 4.22–5.81)
Retic Count, Absolute: 110.2 10*3/uL (ref 19.0–186.0)
Retic Ct Pct: 3.4 % — ABNORMAL HIGH (ref 0.4–3.1)

## 2022-07-05 LAB — IMMATURE PLATELET FRACTION: Immature Platelet Fraction: 0.7 % — ABNORMAL LOW (ref 1.2–8.6)

## 2022-07-05 LAB — LACTATE DEHYDROGENASE: LDH: 177 U/L (ref 98–192)

## 2022-07-05 LAB — FERRITIN: Ferritin: 52 ng/mL (ref 24–336)

## 2022-07-05 LAB — BILIRUBIN, FRACTIONATED(TOT/DIR/INDIR)
Bilirubin, Direct: 0.3 mg/dL — ABNORMAL HIGH (ref 0.0–0.2)
Indirect Bilirubin: 0.9 mg/dL (ref 0.3–0.9)
Total Bilirubin: 1.2 mg/dL (ref 0.3–1.2)

## 2022-07-05 NOTE — Telephone Encounter (Signed)
Never heard back from pt or spouse. Called [facility] to call back.

## 2022-07-06 LAB — MISC LABCORP TEST (SEND OUT): Labcorp test code: 121251

## 2022-07-06 LAB — HAPTOGLOBIN: Haptoglobin: <10 mg/dL — ABNORMAL LOW (ref 32–363)

## 2022-07-07 ENCOUNTER — Ambulatory Visit (HOSPITAL_COMMUNITY)
Admission: RE | Admit: 2022-07-07 | Discharge: 2022-07-07 | Disposition: A | Discharge status: Home / Self Care | Payer: Medicare Other | Source: Ambulatory Visit | Attending: Gastroenterology | Admitting: Gastroenterology

## 2022-07-07 ENCOUNTER — Encounter (HOSPITAL_COMMUNITY): Payer: Self-pay

## 2022-07-07 DIAGNOSIS — R188 Other ascites: Secondary | ICD-10-CM | POA: Diagnosis not present

## 2022-07-07 DIAGNOSIS — K7460 Unspecified cirrhosis of liver: Secondary | ICD-10-CM | POA: Diagnosis not present

## 2022-07-07 LAB — BODY FLUID CELL COUNT WITH DIFFERENTIAL
Eos, Fluid: 0 %
Lymphs, Fluid: 44 %
Monocyte-Macrophage-Serous Fluid: 26 % — ABNORMAL LOW (ref 50–90)
Neutrophil Count, Fluid: 30 % — ABNORMAL HIGH (ref 0–25)
Total Nucleated Cell Count, Fluid: 167 cu mm (ref 0–1000)

## 2022-07-07 LAB — GRAM STAIN

## 2022-07-07 LAB — COPPER, SERUM: Copper: 62 ug/dL — ABNORMAL LOW (ref 69–132)

## 2022-07-07 NOTE — Progress Notes (Signed)
Patient tolerated right sided paracentesis procedure well today and 3.4 Liters of ascites removed with labs collected and sent for processing. Patient verbalized understanding of discharge instructions and ambulatory at departure with no acute distress noted.

## 2022-07-07 NOTE — Procedures (Signed)
PROCEDURE SUMMARY:  Successful ultrasound guided paracentesis from the right lower quadrant.  Yielded 3.4 L of clear yellow fluid.  No immediate complications.  The patient tolerated the procedure well.   Specimen sent for labs.  EBL < 5mL  ** Patient is s/p TIPS 02/14/22 and TIPS revision 05/09/22.   Alwyn Ren, [place] [phone] 07/07/2022, 10:12 AM

## 2022-07-08 LAB — CULTURE, BODY FLUID W GRAM STAIN -BOTTLE

## 2022-07-10 LAB — CULTURE, BODY FLUID W GRAM STAIN -BOTTLE

## 2022-07-11 LAB — IMMUNOFIXATION ELECTROPHORESIS
IgA: 1038 mg/dL — ABNORMAL HIGH (ref 61–437)
IgG (Immunoglobin G), Serum: 1314 mg/dL (ref 603–1613)
IgM (Immunoglobulin M), Srm: 51 mg/dL (ref 20–172)
Total Protein ELP: 5 g/dL — ABNORMAL LOW (ref 6.0–8.5)

## 2022-07-12 ENCOUNTER — Other Ambulatory Visit: Payer: Self-pay | Admitting: Interventional Radiology

## 2022-07-12 DIAGNOSIS — K7460 Unspecified cirrhosis of liver: Secondary | ICD-10-CM

## 2022-07-12 LAB — CULTURE, BODY FLUID W GRAM STAIN -BOTTLE: Culture: NO GROWTH

## 2022-07-13 ENCOUNTER — Ambulatory Visit (INDEPENDENT_AMBULATORY_CARE_PROVIDER_SITE_OTHER): Payer: Medicare Other | Admitting: Family Medicine

## 2022-07-13 DIAGNOSIS — N1831 Chronic kidney disease, stage 3a: Secondary | ICD-10-CM

## 2022-07-13 DIAGNOSIS — D696 Thrombocytopenia, unspecified: Secondary | ICD-10-CM

## 2022-07-13 DIAGNOSIS — K7460 Unspecified cirrhosis of liver: Secondary | ICD-10-CM

## 2022-07-13 NOTE — Progress Notes (Signed)
Subjective:    Patient ID: Shawn [name]., male    DOB: Jul 28, 1955, 67 y.o.   MRN: [id number]  HPI Patient arrives today for 3 month follow up.  Patient presents for follow-up Overall doing well in a relative weight He has significant troubles with cirrhosis as well as ascites needing paracentesis once a week He has seen [facility] for evaluation regarding the possibility of transplant he is low on the list but they are doing some preliminary testing He does have CKD 3 he is followed by Dr. Wolfgang Phoenix Outpatient Encounter Medications as of 07/13/2022  Medication Sig   ascorbic Acid (VITAMIN C) 500 MG CPCR Take 500 mg by mouth daily.   aspirin EC 81 MG tablet Take 81 mg by mouth daily. Swallow whole.   Blood Glucose Monitoring Suppl DEVI 1 each by Does not apply route in the morning, at noon, and at bedtime. May substitute to any manufacturer covered by patient's insurance Dx E11.22   Cholecalciferol (D3 VITAMIN PO) Take by mouth daily. 5000 IU daily   Copper Gluconate 2 MG TABS TAKE 2 AND 1/2 TABLETS (5 MG TOTAL) BY MOUTH 2 (TWO) TIMES DAILY FOR 7 DAYS, THEN 2 AND 1/2 TABLETS (5 MG TOTAL) DAILY.   dorzolamide (TRUSOPT) 2 % ophthalmic solution 1 drop 2 (two) times daily.   fluticasone (FLONASE) 50 MCG/ACT nasal spray SPRAY 2 SPRAYS INTO EACH NOSTRIL EVERY DAY   Glucose Blood (BLOOD GLUCOSE TEST STRIPS) STRP Use to check blood sugar once daily before meals. May substitute to any manufacturer covered by patient's insurance E11.22   lactulose (CHRONULAC) 10 GM/15ML solution Take 30 mLs (20 g total) by mouth 3 (three) times daily.   Lancet Device MISC Use to check blood sugar once daily before meals. May substitute to any manufacturer covered by patient's insurance E11.22   Lancets Misc. MISC Use to check blood sugar once daily before meals. May substitute to any manufacturer covered by patient's insurance   metFORMIN (GLUCOPHAGE) 500 MG tablet TAKE 1 TABLET BY MOUTH EVERY DAY   Omega-3  Fatty Acids (FISH OIL) 1000 MG CAPS Take by mouth daily.   OVER THE COUNTER MEDICATION Copper supplement   pantoprazole (PROTONIX) 40 MG tablet TAKE 1 TABLET BY MOUTH EVERY DAY   rifaximin (XIFAXAN) 550 MG TABS tablet Take 1 tablet (550 mg total) by mouth 2 (two) times daily.   sodium bicarbonate 650 MG tablet Take 650 mg by mouth 3 (three) times daily. Morning, evening, bedtime   spironolactone (ALDACTONE) 25 MG tablet Take 25 mg by mouth daily.   torsemide (DEMADEX) 10 MG tablet Take 10 mg by mouth every other day. For two weeks   venlafaxine XR (EFFEXOR-XR) 150 MG 24 hr capsule Take 1 capsule (150 mg total) by mouth 2 (two) times daily.   zinc gluconate 50 MG tablet Take 50 mg by mouth daily.   Milk Thistle 1000 MG CAPS Take by mouth daily.   vitamin E 180 MG (400 UNITS) capsule Take 400 Units by mouth daily.   No facility-administered encounter medications on file as of 07/13/2022.    [facility] Dr[facility] Wolfgang [facility] was reviewed  Review of Systems     Objective:   Physical Exam  General-in no acute distress Eyes-no discharge Lungs-respiratory rate normal, CTA CV-no murmurs,RRR Extremities skin warm dry significant lower leg edema Neuro grossly normal Behavior normal, alert Patient with ascites noted      Assessment & Plan:  1. Morbid  obesity (HCC) Portion control regular physical activity we did discuss proper protein intake also discussed fitting in some strength exercises such as walking with sitting and standing etc.  2. Cirrhosis, non-alcoholic (HCC) Patient is being evaluated for transplant Follows with gastroenterology We reinforced healthy eating Also reinforced what warning signs to watch for regarding infections when to be seen here when to go to ER We also talked about protein intake  3. Thrombocytopenia (HCC) No bleeding issues currently  4. Stage 3a chronic kidney disease (HCC) Follows with Dr. Wolfgang Phoenix at least every few  months  Patient will follow-up here in 4 months

## 2022-07-14 ENCOUNTER — Ambulatory Visit (HOSPITAL_COMMUNITY)
Admission: RE | Admit: 2022-07-14 | Discharge: 2022-07-14 | Disposition: A | Discharge status: Home / Self Care | Payer: Medicare Other | Source: Ambulatory Visit | Attending: Gastroenterology | Admitting: Gastroenterology

## 2022-07-14 ENCOUNTER — Encounter (HOSPITAL_COMMUNITY): Payer: Self-pay

## 2022-07-14 DIAGNOSIS — R188 Other ascites: Secondary | ICD-10-CM | POA: Diagnosis not present

## 2022-07-14 LAB — BODY FLUID CELL COUNT WITH DIFFERENTIAL
Eos, Fluid: 0 %
Lymphs, Fluid: 45 %
Monocyte-Macrophage-Serous Fluid: 25 % — ABNORMAL LOW (ref 50–90)
Neutrophil Count, Fluid: 30 % — ABNORMAL HIGH (ref 0–25)
Total Nucleated Cell Count, Fluid: 204 cu mm (ref 0–1000)

## 2022-07-14 LAB — GRAM STAIN: Gram Stain: NONE SEEN

## 2022-07-14 NOTE — Progress Notes (Signed)
Patient tolerated right sided paracentesis well today and 4 Liters of ascites removed with labs collected and sent for processing. Patient verbalized understanding of discharge instructions and ambulatory at departure with no acute distress noted.

## 2022-07-14 NOTE — Procedures (Signed)
PROCEDURE SUMMARY:  Successful image-guided paracentesis from the right lower abdomen.  Yielded 4 liters of yellow fluid.  No immediate complications.  EBL = trace. Patient tolerated well.   Specimen was sent for labs.  Please see imaging section of Epic for full dictation.   Kennieth Francois PA-C 07/14/2022 9:56 AM

## 2022-07-15 ENCOUNTER — Ambulatory Visit (HOSPITAL_COMMUNITY): Payer: Medicare Other

## 2022-07-15 LAB — PATHOLOGIST SMEAR REVIEW

## 2022-07-15 LAB — CULTURE, BODY FLUID W GRAM STAIN -BOTTLE

## 2022-07-16 LAB — CULTURE, BODY FLUID W GRAM STAIN -BOTTLE

## 2022-07-17 LAB — CULTURE, BODY FLUID W GRAM STAIN -BOTTLE: Culture: NO GROWTH

## 2022-07-18 ENCOUNTER — Other Ambulatory Visit: Payer: Self-pay | Admitting: Family Medicine

## 2022-07-18 ENCOUNTER — Other Ambulatory Visit: Payer: Self-pay

## 2022-07-18 DIAGNOSIS — D649 Anemia, unspecified: Secondary | ICD-10-CM

## 2022-07-18 NOTE — Progress Notes (Unsigned)
[facility] [address], [place] [address]   CLINIC:  Medical Oncology/Hematology  PCP:  Babs Sciara, MD [address] [place] [address] [phone]   REASON FOR VISIT:  Follow-up for normocytic anemia and thrombocytopenia  CURRENT THERAPY: Copper supplementation  INTERVAL HISTORY:   Shawn [name] 66 y.o. male returns for routine follow-up of normocytic anemia and thrombocytopenia.  He was last seen by Rojelio Brenner PA-C on 06/07/2022.  At today's visit, he reports feeling fair.  He feels that his energy has improved since his last visit, which he attributes to copper supplementation and fluid pills.  He reports decreased swelling and decreased need for paracentesis (once a week instead of 2-3 times weekly).  He reports decreased brain fog.  He is working on getting scheduled for EGD/colonoscopy with local GI, and is being evaluated by NP Annamarie Major ([facility] team) for possible transplant.  He denies any major bleeding events such as rectal bleeding, melena, or epistaxis.  He reports easy bruising and notes that he is also on aspirin 81 mg daily.  He denies any pagophagia but reports that he frequently eats popsicles.  He reports 75% energy and 100% appetite.  ASSESSMENT & PLAN:  1.  Normocytic anemia - PMH: NASH cirrhosis +/- EtOH, TIPS placement, CKD - CBC/differential from 05/25/2022 showed severe anemia with Hgb 8.4/MCV 96.2 - No prior transfusion history.  No prior parenteral iron therapy.  He is not on oral iron therapy.  No ice pica. - Multifactorial etiology of anemia with differential including: Anemia from CKD stage IIIb (GFR 43 based on 2021 CKD-EPI Creatinine-Cystatin C), with creatinine 1.29 and Cystatin C 1.95 Copper deficiency (copper level at 40 as of 05/18/2022).  Other labs showed normal B12, folate, MMA. At risk for GI bleeding due to liver cirrhosis: Denies any rectal bleeding or melena, but has never had previous EGD  or colonoscopy.  Hemoccult stool negative x 3 (05/2022), but reticulocytes elevated at 3.3%.  Hemolysis possible - haptoglobin < 10, reticulocytes elevated at 3.3%, elevated direct bilirubin (cirrhosis).  Normal LDH and DAT. Difficult to assess hemolysis.  Haptoglobin may be falsely low from liver cirrhosis, especially considering that LDH and indirect bilirubin were normal.  Reticulocytosis could be compensation for possible occult GI bleeding. No evidence of plasma cell dyscrasia: Immunofixation shows polyclonal increase in immunoglobulins.  SPEP negative for M spike.  24-hour urine immunofixation was unremarkable.  Kappa light chains 101.3, elevated lambda light chains 84.5, and normal FLC ratio 1.20.   - He has been taking copper supplement since April 2024 - Most recent labs (07/05/2022):  Ferritin 52, iron saturation 21% Copper remains low at 62 (improved from 40) Normal LDH 177.  Reticulocytes elevated 3.4%. - No rectal bleeding or melena  - Most recent CBC (07/19/2022): Hgb improved at 10.1/MCV 97.7.   - PLAN: Continue copper supplementation 6 mg daily (2 mg tablet x 3) - Start ferrous sulfate 325 mg every other day. - Consider starting Retacrit and/or obtaining bone marrow biopsy if hemoglobin does not continue to improve as expected  - Recommend GI follow-up for EGD/colonoscopy  - CBC/D with BB sample in 1 month - Labs in 2 months = CBC/D, ferritin, iron/TIBC, copper, LDH, fractionated bili, haptoglobin, reticulocytes - OFFICE visit 1-2 weeks after labs   2.  Mild thrombocytopenia - PMH: NASH cirrhosis +/- EtOH, TIPS placement, CKD - Ultrasound spleen (10/15/2021): Splenomegaly measuring 12 x 14 x 6 cm, volume 550 cm -  Admits to easy bruising.  Denies any bleeding per rectum or melena. - Hematology workup (05/18/2022): No evidence of acute hepatitis B or hepatitis C.  Normal RF and ANA. - Copper significantly low at 40 (05/18/2022).  Normal B12, folate, MMA. - Most recent CBC  (05/26/2022): Platelets 128 - Immature platelet fraction is LOW at 0.7 - however, this could be hypoproduction of platelets due to copper deficiency - Most recent CBC/differential (07/19/2022) with platelets 133.  Normal WBC 6.2, with mild lymphopenia (ALC 0.6) and mildly elevated eosinophils (0.7)  - DIFFERENTIAL DIAGNOSIS: Most likely from splenic sequestration and copper deficiency.  However, with low immature platelet fraction, bone marrow disorder remains within the differential. - PLAN: Continue copper supplementation 6 mg daily (2 mg tablet x 3) We will monitor copper levels carefully due to caution needed in copper supplementation of cirrhotic patients. Since copper is primarily eliminated in bile, patients with hepatic disease may retain copper, and excessive intake of copper may lead to hepatotoxicity. We will adjust supplementation based on serum copper concentrations. - Recheck copper, CBC, and immature platelet fraction in 2 months. - If platelet count and immature platelet fraction remain low despite copper supplementation, consider bone marrow biopsy.  3.  Social/family history: - Lives at home with his wife.  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  Non-smoker.  Exposure to some cleaning chemicals. - No family history of transfusion dependent anemia.  Mother had breast cancer and colon cancer.  Father had cancer.  Half sister had breast cancer.  Brother and sister had cirrhosis.   PLAN SUMMARY: >> Labs only (CBC/D + BB sample) in 1 month >> Full lab panel in 2 months = CBC/D, LDH, reticulocytes, fractionated bilirubin, haptoglobin, copper, ferritin, iron/TIBC, immature platelet fraction >> OFFICE visit 1 to 2 weeks after full lab panel     REVIEW OF SYSTEMS:  Review of Systems  Constitutional:  Positive for fatigue. Negative for appetite change, chills, diaphoresis, fever and unexpected weight change.  HENT:   Negative for lump/mass,  nosebleeds and trouble swallowing.   Eyes:  Negative for eye problems.  Respiratory:  Negative for cough, hemoptysis and shortness of breath.   Cardiovascular:  Positive for leg swelling. Negative for chest pain and palpitations.  Gastrointestinal:  Positive for abdominal distention and diarrhea. Negative for abdominal pain, blood in stool, constipation, nausea and vomiting.  Genitourinary:  Positive for frequency. Negative for hematuria.   Skin: Negative.   Neurological:  Negative for dizziness, headaches and light-headedness.  Hematological:  Does not bruise/bleed easily.  Psychiatric/Behavioral:  Positive for sleep disturbance.      PHYSICAL EXAM:  ECOG PERFORMANCE STATUS: 1 - Symptomatic but completely ambulatory  There were no vitals filed for this visit. There were no vitals filed for this visit. Physical Exam Constitutional:      Appearance: Normal appearance. He is obese.  Cardiovascular:     Heart sounds: No murmur (no evidence of previously noted questionable faint systolic murmur at RUSB) heard. Pulmonary:     Breath sounds: Normal breath sounds.  Neurological:     General: No focal deficit present.     Mental Status: Mental status is at baseline.  Psychiatric:        Behavior: Behavior normal. Behavior is cooperative.     PAST MEDICAL/SURGICAL HISTORY:  Past Medical History:  Diagnosis Date   ADD (attention deficit disorder)    ADHD    Anxiety 1987   mild (crowds, noises)  Anxiety    Attention deficit disorder    Cancer (HCC)    Cirrhosis of liver (HCC)    CVA (cerebral vascular accident) (HCC)    Depression    Diabetic neuropathy (HCC)    Essential hypertension, benign    Long-standing history, negative secondary workup   Headache    History of cardiac catheterization    Widely patent coronary and renal arteries November 2013   Hyperlipidemia    Hypertension    whitecoat   Obstructive sleep apnea    cpap   Prediabetes    Sleep apnea    TIA  (transient ischemic attack)    Possible, 2007   Type 2 diabetes mellitus (HCC)    Venous stasis    Past Surgical History:  Procedure Laterality Date   ANTERIOR CERVICAL DECOMP/DISCECTOMY FUSION N/A 12/30/2015   Procedure: ANTERIOR CERVICAL DECOMPRESSION/DISCECTOMY FUSION C6 - C7 1 LEVEL;  Surgeon: Venita Lick, MD;  Location: [facility] OR;  Service: Orthopedics;  Laterality: N/A;   CARDIAC CATHETERIZATION  01/2012   negative   CARPAL TUNNEL RELEASE     CERVICAL SPINE NERVE BLOCK  05/17/2017   IR INTRAVASCULAR ULTRASOUND NON CORONARY  02/14/2022   IR PARACENTESIS  02/14/2022   IR PARACENTESIS  05/09/2022   IR RADIOLOGIST EVAL & MGMT  12/03/2021   IR RADIOLOGIST EVAL & MGMT  01/13/2022   IR RADIOLOGIST EVAL & MGMT  03/24/2022   IR RADIOLOGIST EVAL & MGMT  04/20/2022   IR RADIOLOGIST EVAL & MGMT  06/08/2022   IR TIPS  02/14/2022   IR TIPS REVISION MOD SED  05/09/2022   IR US GUIDE VASC ACCESS RIGHT  02/14/2022   IR US GUIDE VASC ACCESS RIGHT  02/14/2022   IR US GUIDE VASC ACCESS RIGHT  05/09/2022   KNEE SURGERY Right    NECK SURGERY     RADIOLOGY WITH ANESTHESIA N/A 02/14/2022   Procedure: TIPS;  Surgeon: Bennie Dallas, MD;  Location: [facility] OR;  Service: Radiology;  Laterality: N/A;   VASECTOMY      SOCIAL HISTORY:  Social History   Socioeconomic History   Marital status: Married    Spouse name: Not on file   Number of children: 4   Years of education: Not on file   Highest education level: 9th grade  Occupational History   Occupation: Systems developer   Tobacco Use   Smoking status: Never   Smokeless tobacco: Current    Types: Snuff   Tobacco comments:    Uses dip pouches occasionally  Vaping Use   Vaping Use: Never used  Substance and Sexual Activity   Alcohol use: No    Alcohol/week: 0.0 standard drinks of alcohol    Comment: Previously 1-2 cases of beer/week. patient states never on regular basis though. last etoh in 2013.    Drug use: No   Sexual activity: Not Currently  Other  Topics Concern   Not on file  Social History Narrative   ** Merged History Encounter **       Social Determinants of Health   Financial Resource Strain: Patient Declined (07/09/2022)   Overall Financial Resource Strain (CARDIA)    Difficulty of Paying Living Expenses: Patient declined  Food Insecurity: Patient Declined (07/09/2022)   Hunger Vital Sign    Worried About Running Out of Food in the Last Year: Patient declined    Ran Out of Food in the Last Year: Patient declined  Transportation Needs: Unmet Transportation Needs (07/09/2022)   PRAPARE - Transportation  Lack of Transportation (Medical): Yes    Lack of Transportation (Non-Medical): No  Physical Activity: Unknown (07/09/2022)   Exercise Vital Sign    Days of Exercise per Week: 1 day    Minutes of Exercise per Session: Patient declined  Stress: Stress Concern Present (07/09/2022)   [employer] of Occupational Health - Occupational Stress Questionnaire    Feeling of Stress : Very much  Social Connections: Unknown (07/09/2022)   Social Connection and Isolation Panel [NHANES]    Frequency of Communication with Friends and Family: More than three times a week    Frequency of Social Gatherings with Friends and Family: Once a week    Attends Religious Services: Patient declined    Database administrator or Organizations: No    Attends Banker Meetings: Never    Marital Status: Married  Catering manager Violence: Not At Risk (05/24/2022)   Humiliation, Afraid, Rape, and Kick questionnaire    Fear of Current or Ex-Partner: No    Emotionally Abused: No    Physically Abused: No    Sexually Abused: No    FAMILY HISTORY:  Family History  Problem Relation Age of Onset   Colon cancer Mother    Hypertension Mother    CAD Mother    Breast cancer Mother    Cirrhosis Brother     CURRENT MEDICATIONS:  Outpatient Encounter Medications as of 07/19/2022  Medication Sig   ascorbic Acid (VITAMIN C) 500 MG CPCR Take 500  mg by mouth daily.   aspirin EC 81 MG tablet Take 81 mg by mouth daily. Swallow whole.   Blood Glucose Monitoring Suppl DEVI 1 each by Does not apply route in the morning, at noon, and at bedtime. May substitute to any manufacturer covered by patient's insurance Dx E11.22   Cholecalciferol (D3 VITAMIN PO) Take by mouth daily. 5000 IU daily   Copper Gluconate 2 MG TABS TAKE 2 AND 1/2 TABLETS (5 MG TOTAL) BY MOUTH 2 (TWO) TIMES DAILY FOR 7 DAYS, THEN 2 AND 1/2 TABLETS (5 MG TOTAL) DAILY.   dorzolamide (TRUSOPT) 2 % ophthalmic solution 1 drop 2 (two) times daily.   fluticasone (FLONASE) 50 MCG/ACT nasal spray SPRAY 2 SPRAYS INTO EACH NOSTRIL EVERY DAY   Glucose Blood (BLOOD GLUCOSE TEST STRIPS) STRP Use to check blood sugar once daily before meals. May substitute to any manufacturer covered by patient's insurance E11.22   lactulose (CHRONULAC) 10 GM/15ML solution Take 30 mLs (20 g total) by mouth 3 (three) times daily.   Lancet Device MISC Use to check blood sugar once daily before meals. May substitute to any manufacturer covered by patient's insurance E11.22   Lancets Misc. MISC Use to check blood sugar once daily before meals. May substitute to any manufacturer covered by patient's insurance   metFORMIN (GLUCOPHAGE) 500 MG tablet TAKE 1 TABLET BY MOUTH EVERY DAY   Milk Thistle 1000 MG CAPS Take by mouth daily.   Omega-3 Fatty Acids (FISH OIL) 1000 MG CAPS Take by mouth daily.   OVER THE COUNTER MEDICATION Copper supplement   pantoprazole (PROTONIX) 40 MG tablet TAKE 1 TABLET BY MOUTH EVERY DAY   rifaximin (XIFAXAN) 550 MG TABS tablet Take 1 tablet (550 mg total) by mouth 2 (two) times daily.   sodium bicarbonate 650 MG tablet Take 650 mg by mouth 3 (three) times daily. Morning, evening, bedtime   spironolactone (ALDACTONE) 25 MG tablet Take 25 mg by mouth daily.   torsemide (DEMADEX) 10 MG tablet Take 10  mg by mouth every other day. For two weeks   venlafaxine XR (EFFEXOR-XR) 150 MG 24 hr capsule  Take 1 capsule (150 mg total) by mouth 2 (two) times daily.   vitamin E 180 MG (400 UNITS) capsule Take 400 Units by mouth daily.   zinc gluconate 50 MG tablet Take 50 mg by mouth daily.   No facility-administered encounter medications on file as of 07/19/2022.    ALLERGIES:  No Known Allergies  LABORATORY DATA:  I have reviewed the labs as listed.  CBC    Component Value Date/Time   WBC 6.6 07/05/2022 0812   RBC 3.28 (L) 07/05/2022 0812   RBC 3.27 (L) 07/05/2022 0812   HGB 10.6 (L) 07/05/2022 0812   HGB 11.2 (L) 05/03/2022 0903   HCT 32.0 (L) 07/05/2022 0812   HCT 32.7 (L) 05/03/2022 0903   PLT 177 07/05/2022 0812   PLT 164 05/03/2022 0903   MCV 97.9 07/05/2022 0812   MCV 92 05/03/2022 0903   MCH 32.4 07/05/2022 0812   MCHC 33.1 07/05/2022 0812   RDW 15.1 07/05/2022 0812   RDW 13.7 05/03/2022 0903   LYMPHSABS 0.6 (L) 07/05/2022 0812   LYMPHSABS 1.0 05/03/2022 0903   MONOABS 0.7 07/05/2022 0812   EOSABS 0.7 (H) 07/05/2022 0812   EOSABS 0.6 (H) 05/03/2022 0903   BASOSABS 0.1 07/05/2022 0812   BASOSABS 0.1 05/03/2022 0903      Latest Ref Rng & Units 07/05/2022    8:12 AM 05/26/2022    4:38 AM 05/25/2022    4:47 AM  CMP  Glucose 70 - 99 mg/dL  161  096   BUN 8 - 23 mg/dL  21  20   Creatinine 0.45 - 1.24 mg/dL  4.09  8.11   Sodium 914 - 145 mmol/L  142  141   Potassium 3.5 - 5.1 mmol/L  3.6  2.9   Chloride 98 - 111 mmol/L  110  109   CO2 22 - 32 mmol/L  26  24   Calcium 8.9 - 10.3 mg/dL  8.7  8.4   Total Protein 6.5 - 8.1 g/dL  5.5  5.3   Total Bilirubin 0.3 - 1.2 mg/dL 1.2  1.9  1.9   Alkaline Phos 38 - 126 U/L  105  112   AST 15 - 41 U/L  47  44   ALT 0 - 44 U/L  24  24     DIAGNOSTIC IMAGING:  I have independently reviewed the relevant imaging and discussed with the patient.   WRAP UP:  All questions were answered. The patient knows to call the clinic with any problems, questions or concerns.  Medical decision making: Moderate  Time spent on visit: I  spent 20 minutes counseling the patient face to face. The total time spent in the appointment was 30 minutes and more than 50% was on counseling.  Carnella Guadalajara, PA-C  07/19/22 9:47 AM

## 2022-07-19 ENCOUNTER — Other Ambulatory Visit: Payer: Self-pay

## 2022-07-19 ENCOUNTER — Inpatient Hospital Stay: Payer: Medicare Other

## 2022-07-19 ENCOUNTER — Inpatient Hospital Stay: Payer: Medicare Other | Attending: Hematology | Admitting: Physician Assistant

## 2022-07-19 VITALS — BP 108/66 | HR 75 | Temp 98.1°F | Resp 18 | Wt 211.2 lb

## 2022-07-19 DIAGNOSIS — Z800 Family history of malignant neoplasm of digestive organs: Secondary | ICD-10-CM | POA: Insufficient documentation

## 2022-07-19 DIAGNOSIS — D649 Anemia, unspecified: Secondary | ICD-10-CM | POA: Diagnosis not present

## 2022-07-19 DIAGNOSIS — D631 Anemia in chronic kidney disease: Secondary | ICD-10-CM | POA: Insufficient documentation

## 2022-07-19 DIAGNOSIS — D696 Thrombocytopenia, unspecified: Secondary | ICD-10-CM

## 2022-07-19 DIAGNOSIS — E610 Copper deficiency: Secondary | ICD-10-CM | POA: Diagnosis not present

## 2022-07-19 DIAGNOSIS — N1832 Chronic kidney disease, stage 3b: Secondary | ICD-10-CM | POA: Insufficient documentation

## 2022-07-19 DIAGNOSIS — Z803 Family history of malignant neoplasm of breast: Secondary | ICD-10-CM | POA: Diagnosis not present

## 2022-07-19 DIAGNOSIS — E1122 Type 2 diabetes mellitus with diabetic chronic kidney disease: Secondary | ICD-10-CM | POA: Insufficient documentation

## 2022-07-19 LAB — CBC WITH DIFFERENTIAL/PLATELET
Abs Immature Granulocytes: 0.02 10*3/uL (ref 0.00–0.07)
Basophils Absolute: 0.1 10*3/uL (ref 0.0–0.1)
Basophils Relative: 2 %
Eosinophils Absolute: 0.7 10*3/uL — ABNORMAL HIGH (ref 0.0–0.5)
Eosinophils Relative: 11 %
HCT: 30.4 % — ABNORMAL LOW (ref 39.0–52.0)
Hemoglobin: 10.1 g/dL — ABNORMAL LOW (ref 13.0–17.0)
Immature Granulocytes: 0 %
Lymphocytes Relative: 10 %
Lymphs Abs: 0.6 10*3/uL — ABNORMAL LOW (ref 0.7–4.0)
MCH: 32.5 pg (ref 26.0–34.0)
MCHC: 33.2 g/dL (ref 30.0–36.0)
MCV: 97.7 fL (ref 80.0–100.0)
Monocytes Absolute: 0.7 10*3/uL (ref 0.1–1.0)
Monocytes Relative: 12 %
Neutro Abs: 4 10*3/uL (ref 1.7–7.7)
Neutrophils Relative %: 65 %
Platelets: 133 10*3/uL — ABNORMAL LOW (ref 150–400)
RBC: 3.11 MIL/uL — ABNORMAL LOW (ref 4.22–5.81)
RDW: 15.1 % (ref 11.5–15.5)
WBC: 6.2 10*3/uL (ref 4.0–10.5)
nRBC: 0 % (ref 0.0–0.2)

## 2022-07-19 LAB — SAMPLE TO BLOOD BANK

## 2022-07-19 NOTE — Patient Instructions (Signed)
[facility] at [facility] **VISIT SUMMARY & IMPORTANT INSTRUCTIONS **   You were seen today by Rojelio Brenner PA-C for your anemia and low platelets (thrombocytopenia).    LOW PLATELETS This may be related to your enlarged spleen from your liver cirrhosis. This may also be related to your copper deficiency. If you continue to have significant platelet abnormalities after your copper levels have improved, we would consider additional testing such as a bone marrow biopsy.  ANEMIA Your anemia may be related to copper deficiency, chronic kidney disease, or possible blood loss. We will keep a close watch on your blood counts to see if they improve appropriately after starting you on copper. We will also start you on iron supplement (ferrous sulfate 325 mg every other day) due to mild iron deficiency.  It is important to continue follow-up with your GI specialist to see if you are losing any blood from your stomach or intestines. LABS: - We will check blood count (CBC) every month - Will check full lab panel in 2 months  MEDICATIONS: - Continue taking copper supplement daily (if the dose of your copper pill is 2 mg each, take 3 tablets for a total of 6 mg daily). - Start taking iron supplement (ferrous sulfate 325 mg) every other day.  FOLLOW-UP APPOINTMENT: Office visit in 2 months (2 weeks after full lab panel)  ** Thank you for trusting me with your healthcare!  I strive to provide all of my patients with quality care at each visit.  If you receive a survey for this visit, I would be so grateful to you for taking the time to provide feedback.  Thank you in advance!  ~ Coleman Kalas                   Dr. Doreatha Massed   &   Rojelio Brenner, PA-C   - - - - - - - - - - - - - - - - - -    Thank you for choosing [facility] at [facility] to provide your oncology and hematology care.  To afford each patient quality time with our provider,  please arrive at least 15 minutes before your scheduled appointment time.   If you have a lab appointment with the Cancer Center please come in thru the Main Entrance and check in at the main information desk.  You need to re-schedule your appointment should you arrive 10 or more minutes late.  We strive to give you quality time with our providers, and arriving late affects you and other patients whose appointments are after yours.  Also, if you no show three or more times for appointments you may be dismissed from the clinic at the providers discretion.     Again, thank you for choosing [facility].  Our hope is that these requests will decrease the amount of time that you wait before being seen by our physicians.       _____________________________________________________________  Should you have questions after your visit to [facility], please contact our office at [phone] and follow the prompts.  Our office hours are 8:00 a.m. and 4:30 p.m. Monday - Friday.  Please note that voicemails left after 4:00 p.m. may not be returned until the following business day.  We are closed weekends and major holidays.  You do have access to a nurse 24-7, just call the main number to the clinic [phone] and  do not press any options, hold on the line and a nurse will answer the phone.    For prescription refill requests, have your pharmacy contact our office and allow 72 hours.

## 2022-07-21 ENCOUNTER — Ambulatory Visit (HOSPITAL_COMMUNITY)
Admission: RE | Admit: 2022-07-21 | Discharge: 2022-07-21 | Disposition: A | Discharge status: Home / Self Care | Payer: Medicare Other | Source: Ambulatory Visit | Attending: Gastroenterology | Admitting: Gastroenterology

## 2022-07-21 ENCOUNTER — Encounter (HOSPITAL_COMMUNITY): Payer: Self-pay

## 2022-07-21 DIAGNOSIS — R188 Other ascites: Secondary | ICD-10-CM

## 2022-07-21 DIAGNOSIS — K7460 Unspecified cirrhosis of liver: Secondary | ICD-10-CM | POA: Diagnosis not present

## 2022-07-21 LAB — GRAM STAIN

## 2022-07-21 LAB — BODY FLUID CELL COUNT WITH DIFFERENTIAL
Eos, Fluid: 0 %
Lymphs, Fluid: 48 %
Monocyte-Macrophage-Serous Fluid: 33 % — ABNORMAL LOW (ref 50–90)
Neutrophil Count, Fluid: 19 % (ref 0–25)
Total Nucleated Cell Count, Fluid: 172 cu mm (ref 0–1000)

## 2022-07-21 NOTE — Procedures (Signed)
PROCEDURE SUMMARY:  Successful US guided paracentesis from right lower abdomen.  Yielded 4 L of clear yellow fluid.  No immediate complications.  Pt tolerated well.   Specimen sent for labs.  EBL < 2 mL  ** TIPS 02/14/22 with TIPS revision 05/09/22. Patient is scheduled for a follow up with Dr. Elby Showers 08/09/22 to discuss potential additional procedure to decrease ascites.   Mickie Kay, NP 07/21/2022 10:11 AM

## 2022-07-21 NOTE — Progress Notes (Signed)
Patient tolerated right sided paracentesis procedure well today and 4 liters of ascites removed with labs collected/sent for processing. Patient verbalized understanding of discharge instructions and ambulatory at departure with no acute distress noted.

## 2022-07-24 LAB — CULTURE, BODY FLUID W GRAM STAIN -BOTTLE

## 2022-07-26 LAB — CULTURE, BODY FLUID W GRAM STAIN -BOTTLE: Culture: NO GROWTH

## 2022-07-27 ENCOUNTER — Encounter (HOSPITAL_COMMUNITY): Payer: Self-pay

## 2022-07-27 ENCOUNTER — Ambulatory Visit (HOSPITAL_COMMUNITY)
Admission: RE | Admit: 2022-07-27 | Discharge: 2022-07-27 | Disposition: A | Discharge status: Home / Self Care | Payer: Medicare Other | Source: Ambulatory Visit | Attending: Gastroenterology | Admitting: Gastroenterology

## 2022-07-27 DIAGNOSIS — R188 Other ascites: Secondary | ICD-10-CM

## 2022-07-27 LAB — GRAM STAIN: Gram Stain: NONE SEEN

## 2022-07-27 LAB — BODY FLUID CELL COUNT WITH DIFFERENTIAL
Eos, Fluid: 0 %
Lymphs, Fluid: 31 %
Monocyte-Macrophage-Serous Fluid: 22 % — ABNORMAL LOW (ref 50–90)
Neutrophil Count, Fluid: 47 % — ABNORMAL HIGH (ref 0–25)
Total Nucleated Cell Count, Fluid: 276 cu mm (ref 0–1000)

## 2022-07-27 NOTE — Procedures (Signed)
PROCEDURE SUMMARY:  Successful ultrasound guided paracentesis from the right lower quadrant.  Yielded 3.5 L of clear yelloe fluid.  No immediate complications.  The patient tolerated the procedure well.   Specimen was sent for labs.  EBL < 5mL  Patient is s/p TIPS 02/14/22 with revision 05/09/22 and is followed by the [facility] clinic. Next appointment 08/09/22 with Dr. Elby Showers to discuss potential additional procedure to decrease ascites.  Lynnette Caffey, PA-C

## 2022-07-27 NOTE — Progress Notes (Signed)
Patient tolerated right sided paracentesis procedure well today and 3.5 Liters of ascites removed with labs collected and sent for processing. Patient verbalized understanding of discharge instructions and ambulatory at departure with no acute distress noted.

## 2022-07-29 ENCOUNTER — Other Ambulatory Visit: Payer: Self-pay | Admitting: *Deleted

## 2022-07-29 ENCOUNTER — Ambulatory Visit (HOSPITAL_COMMUNITY): Payer: Medicare Other

## 2022-07-29 ENCOUNTER — Telehealth: Payer: Self-pay | Admitting: *Deleted

## 2022-07-29 DIAGNOSIS — R188 Other ascites: Secondary | ICD-10-CM

## 2022-07-29 LAB — CULTURE, BODY FLUID W GRAM STAIN -BOTTLE: Culture: NO GROWTH

## 2022-07-29 NOTE — Telephone Encounter (Signed)
Standing orders for paracentesis placed.

## 2022-07-29 NOTE — Telephone Encounter (Signed)
Pt called and states he needs some more standing orders for paracentesis. He has his last one coming up this week and is still swelling.

## 2022-07-29 NOTE — Telephone Encounter (Signed)
Noted  

## 2022-07-29 NOTE — Telephone Encounter (Signed)
We will get this arranged.  Mindy/Tammy: Please place standing orders for weekly paracentesis x 3 months. Needs 25 g of 25% albumin at the start. Max 5 L. Needs body fluid cell count, culture, Gram stain.

## 2022-07-30 LAB — CULTURE, BODY FLUID W GRAM STAIN -BOTTLE

## 2022-08-02 LAB — CULTURE, BODY FLUID W GRAM STAIN -BOTTLE

## 2022-08-04 ENCOUNTER — Encounter (HOSPITAL_COMMUNITY): Payer: Self-pay

## 2022-08-04 ENCOUNTER — Ambulatory Visit (HOSPITAL_COMMUNITY)
Admission: RE | Admit: 2022-08-04 | Discharge: 2022-08-04 | Disposition: A | Discharge status: Home / Self Care | Payer: Medicare Other | Source: Ambulatory Visit | Attending: Gastroenterology | Admitting: Gastroenterology

## 2022-08-04 DIAGNOSIS — R188 Other ascites: Secondary | ICD-10-CM | POA: Diagnosis present

## 2022-08-04 LAB — BODY FLUID CELL COUNT WITH DIFFERENTIAL
Eos, Fluid: 0 %
Lymphs, Fluid: 30 %
Monocyte-Macrophage-Serous Fluid: 52 % (ref 50–90)
Neutrophil Count, Fluid: 18 % (ref 0–25)
Total Nucleated Cell Count, Fluid: 202 cu mm (ref 0–1000)

## 2022-08-04 LAB — GRAM STAIN: Gram Stain: NONE SEEN

## 2022-08-04 MED ORDER — ALBUMIN HUMAN 25 % IV SOLN
INTRAVENOUS | Status: AC
  Filled 2022-08-04: qty 100

## 2022-08-04 MED ORDER — ALBUMIN HUMAN 25 % IV SOLN
0.0000 g | Freq: Once | INTRAVENOUS | Status: AC
  Administered 2022-08-04: 25 g via INTRAVENOUS
  Filled 2022-08-04: qty 400

## 2022-08-04 NOTE — Procedures (Signed)
PROCEDURE SUMMARY:  Successful US guided diagnostic and therapeutic paracentesis from RLQ.  Yielded 4 L of clear, yellow fluid.  No immediate complications.  Pt tolerated well.   Specimen was sent for labs.  EBL < 1 mL  Shon Hough, AGNP 08/04/2022 11:27 AM

## 2022-08-04 NOTE — Progress Notes (Signed)
Patient tolerated right sided paracentesis procedure and 25G of IV albumin well today and 4 Liters of clear yellow ascites removed with labs collected and sent for processing. Patient verbalized understanding of discharge instructions and ambulatory at departure with no acute distress noted.

## 2022-08-06 LAB — CULTURE, BODY FLUID W GRAM STAIN -BOTTLE

## 2022-08-07 LAB — CULTURE, BODY FLUID W GRAM STAIN -BOTTLE: Culture: NO GROWTH

## 2022-08-09 ENCOUNTER — Ambulatory Visit
Admission: RE | Admit: 2022-08-09 | Discharge: 2022-08-09 | Disposition: A | Discharge status: Home / Self Care | Payer: Medicare Other | Source: Ambulatory Visit | Attending: Interventional Radiology | Admitting: Interventional Radiology

## 2022-08-09 DIAGNOSIS — K7460 Unspecified cirrhosis of liver: Secondary | ICD-10-CM

## 2022-08-09 DIAGNOSIS — R188 Other ascites: Secondary | ICD-10-CM | POA: Diagnosis not present

## 2022-08-09 DIAGNOSIS — K7581 Nonalcoholic steatohepatitis (NASH): Secondary | ICD-10-CM | POA: Diagnosis not present

## 2022-08-09 HISTORY — PX: IR RADIOLOGIST EVAL & MGMT: IMG5224

## 2022-08-09 LAB — CULTURE, BODY FLUID W GRAM STAIN -BOTTLE

## 2022-08-09 NOTE — Progress Notes (Signed)
Referring Physician(s): Eula Listen, MD   Reason for follow up:  Virtual telephone visit status post TIPS creation on 02/14/22 and TIPS revision on 05/09/22   History of present illness: Shawn Walde. is a 67 y.o. male with history of NASH cirrhosis (Child Pugh B, MELD 15) with recurrent ascites and renal insufficiency preventing use of diuretics status post TIPS creation on 02/14/22.  He presents today via virtual telephone visit accompanied by his wife.   He is still requiring 1/week paracentesis.  He recently met with Annamarie Major and transplant listing initiation has occurred.  Activity increased.  Feeling much better.  Stringent with low sodium diet, eliminating fast foods.     Past Medical History:  Diagnosis Date   ADD (attention deficit disorder)    ADHD    Anxiety 1987   mild (crowds, noises)   Anxiety    Attention deficit disorder    Cancer (HCC)    Cirrhosis of liver (HCC)    CVA (cerebral vascular accident) (HCC)    Depression    Diabetic neuropathy (HCC)    Essential hypertension, benign    Long-standing history, negative secondary workup   Headache    History of cardiac catheterization    Widely patent coronary and renal arteries November 2013   Hyperlipidemia    Hypertension    whitecoat   Obstructive sleep apnea    cpap   Prediabetes    Sleep apnea    TIA (transient ischemic attack)    Possible, 2007   Type 2 diabetes mellitus (HCC)    Venous stasis     Past Surgical History:  Procedure Laterality Date   ANTERIOR CERVICAL DECOMP/DISCECTOMY FUSION N/A 12/30/2015   Procedure: ANTERIOR CERVICAL DECOMPRESSION/DISCECTOMY FUSION C6 - C7 1 LEVEL;  Surgeon: Venita Lick, MD;  Location: [facility] OR;  Service: Orthopedics;  Laterality: N/A;   CARDIAC CATHETERIZATION  01/2012   negative   CARPAL TUNNEL RELEASE     CERVICAL SPINE NERVE BLOCK  05/17/2017   IR INTRAVASCULAR ULTRASOUND NON CORONARY  02/14/2022   IR PARACENTESIS  02/14/2022   IR PARACENTESIS   05/09/2022   IR RADIOLOGIST EVAL & MGMT  12/03/2021   IR RADIOLOGIST EVAL & MGMT  01/13/2022   IR RADIOLOGIST EVAL & MGMT  03/24/2022   IR RADIOLOGIST EVAL & MGMT  04/20/2022   IR RADIOLOGIST EVAL & MGMT  06/08/2022   IR TIPS  02/14/2022   IR TIPS REVISION MOD SED  05/09/2022   IR US GUIDE VASC ACCESS RIGHT  02/14/2022   IR US GUIDE VASC ACCESS RIGHT  02/14/2022   IR US GUIDE VASC ACCESS RIGHT  05/09/2022   KNEE SURGERY Right    NECK SURGERY     RADIOLOGY WITH ANESTHESIA N/A 02/14/2022   Procedure: TIPS;  Surgeon: Bennie Dallas, MD;  Location: [facility] OR;  Service: Radiology;  Laterality: N/A;   VASECTOMY      Allergies: Patient has no known allergies.  Medications: Prior to Admission medications   Medication Sig Start Date End Date Taking? Authorizing Provider  ascorbic Acid (VITAMIN C) 500 MG CPCR Take 500 mg by mouth daily.    [provider]  aspirin EC 81 MG tablet Take 81 mg by mouth daily. Swallow whole.    [provider]  Blood Glucose Monitoring Suppl DEVI 1 each by Does not apply route in the morning, at noon, and at bedtime. May substitute to any manufacturer covered by patient's insurance Dx E11.22 05/12/22  Babs Sciara, MD  Cholecalciferol (D3 VITAMIN PO) Take by mouth daily. 5000 IU daily    [provider]  Copper Gluconate 2 MG TABS TAKE 2 AND 1/2 TABLETS (5 MG TOTAL) BY MOUTH 2 (TWO) TIMES DAILY FOR 7 DAYS, THEN 2 AND 1/2 TABLETS (5 MG TOTAL) DAILY. 06/12/22   Carnella Guadalajara, PA-C  dorzolamide (TRUSOPT) 2 % ophthalmic solution 1 drop 2 (two) times daily. 04/04/22   [provider]  fluticasone (FLONASE) 50 MCG/ACT nasal spray SPRAY 2 SPRAYS INTO EACH NOSTRIL EVERY DAY 06/13/22   Babs Sciara, MD  Glucose Blood (BLOOD GLUCOSE TEST STRIPS) STRP Use to check blood sugar once daily before meals. May substitute to any manufacturer covered by patient's insurance E11.22 05/12/22   Babs Sciara, MD  lactulose (CHRONULAC) 10 GM/15ML solution Take  30 mLs (20 g total) by mouth 3 (three) times daily. 06/22/22 06/22/23  Letta Median, PA-C  Lancet Device MISC Use to check blood sugar once daily before meals. May substitute to any manufacturer covered by patient's insurance E11.22 05/12/22   Babs Sciara, MD  Lancets Misc. MISC Use to check blood sugar once daily before meals. May substitute to any manufacturer covered by patient's insurance 05/12/22   Babs Sciara, MD  metFORMIN (GLUCOPHAGE) 500 MG tablet TAKE 1 TABLET BY MOUTH EVERY DAY 06/13/22   Babs Sciara, MD  Omega-3 Fatty Acids (FISH OIL) 1000 MG CAPS Take by mouth daily.    [provider]  OVER THE COUNTER MEDICATION Copper supplement    [provider]  pantoprazole (PROTONIX) 40 MG tablet TAKE 1 TABLET BY MOUTH EVERY DAY 06/24/22   Babs Sciara, MD  rifaximin (XIFAXAN) 550 MG TABS tablet Take 1 tablet (550 mg total) by mouth 2 (two) times daily. 04/13/22   Rourk, Gerrit Friends, MD  sodium bicarbonate 650 MG tablet Take 650 mg by mouth 3 (three) times daily. Morning, evening, bedtime    [provider]  spironolactone (ALDACTONE) 25 MG tablet Take 25 mg by mouth daily. 05/23/22 05/23/23  [provider]  torsemide (DEMADEX) 10 MG tablet Take 10 mg by mouth every other day. For two weeks 05/23/22 05/23/23  [provider]  venlafaxine XR (EFFEXOR-XR) 150 MG 24 hr capsule TAKE 1 CAPSULE BY MOUTH TWICE A DAY 07/19/22   Babs Sciara, MD  zinc gluconate 50 MG tablet Take 50 mg by mouth daily.    [provider]     Family History  Problem Relation Age of Onset   Colon cancer Mother    Hypertension Mother    CAD Mother    Breast cancer Mother    Cirrhosis Brother     Social History   Socioeconomic History   Marital status: Married    Spouse name: Not on file   Number of children: 4   Years of education: Not on file   Highest education level: 9th grade  Occupational History   Occupation: Systems developer   Tobacco Use   Smoking  status: Never   Smokeless tobacco: Current    Types: Snuff   Tobacco comments:    Uses dip pouches occasionally  Vaping Use   Vaping Use: Never used  Substance and Sexual Activity   Alcohol use: No    Alcohol/week: 0.0 standard drinks of alcohol    Comment: Previously 1-2 cases of beer/week. patient states never on regular basis though. last etoh in 2013.    Drug use: No  Sexual activity: Not Currently  Other Topics Concern   Not on file  Social History Narrative   ** Merged History Encounter **       Social Determinants of Health   Financial Resource Strain: Patient Declined (07/09/2022)   Overall Financial Resource Strain (CARDIA)    Difficulty of Paying Living Expenses: Patient declined  Food Insecurity: Patient Declined (07/09/2022)   Hunger Vital Sign    Worried About Running Out of Food in the Last Year: Patient declined    Ran Out of Food in the Last Year: Patient declined  Transportation Needs: Unmet Transportation Needs (07/09/2022)   PRAPARE - Administrator, Civil Service (Medical): Yes    Lack of Transportation (Non-Medical): No  Physical Activity: Unknown (07/09/2022)   Exercise Vital Sign    Days of Exercise per Week: 1 day    Minutes of Exercise per Session: Patient declined  Stress: Stress Concern Present (07/09/2022)   [employer] of Occupational Health - Occupational Stress Questionnaire    Feeling of Stress : Very much  Social Connections: Unknown (07/09/2022)   Social Connection and Isolation Panel [NHANES]    Frequency of Communication with Friends and Family: More than three times a week    Frequency of Social Gatherings with Friends and Family: Once a week    Attends Religious Services: Patient declined    Database administrator or Organizations: No    Attends Banker Meetings: Never    Marital Status: Married     Vital Signs: There were no vitals taken for this visit.  No physical examination was performed in lieu of  virtual telephone clinic visit.  Imaging: TIPS 02/14/22  7+2 Viatorr, no post deployment balloon molding. Gradient from 22-->9   TIPS revision 05/09/22  Balloon expansion to 10 mm Gradient from 18 --> 8  Labs:  CBC: Recent Labs    05/26/22 0438 06/21/22 0818 07/05/22 0812 07/19/22 0818  WBC 5.2 6.5 6.6 6.2  HGB 8.7* 10.0* 10.6* 10.1*  HCT 25.9* 29.7* 32.0* 30.4*  PLT 128* 135* 177 133*    COAGS: Recent Labs    05/09/22 1102 05/24/22 1127 05/25/22 0447 05/26/22 0438  INR 1.4* 1.5* 1.8* 1.7*    BMP: Recent Labs    05/18/22 1410 05/24/22 1127 05/25/22 0447 05/26/22 0438  NA 139 139 141 142  K 3.3* 3.6 2.9* 3.6  CL 110 108 109 110  CO2 23 25 24 26   GLUCOSE 85 114* 121* 121*  BUN 19 19 20 21   CALCIUM 8.4* 8.4* 8.4* 8.7*  CREATININE 1.30* 1.38* 1.24 1.29*  GFRNONAA >60 57* >60 >60    LIVER FUNCTION TESTS: Recent Labs    05/09/22 1102 05/18/22 1410 05/24/22 1127 05/25/22 0447 05/26/22 0438 07/05/22 0812  BILITOT 2.1*  --  1.7* 1.9* 1.9* 1.2  AST 56*  --  57* 44* 47*  --   ALT 28  --  31 24 24   --   ALKPHOS 138*  --  144* 112 105  --   PROT 5.6*  --  5.3* 5.3* 5.5*  --   ALBUMIN 2.1* 2.1* 2.3* 3.0* 3.1*  --     Assessment and Plan: Shawn [name]. is a 67 y.o. male with history of NASH cirrhosis (Child Pugh B9, MELD 15 pre TIPS -->1 7 currently) with recurrent ascites and renal insufficiency preventing use of diuretics status post TIPS creation on 02/14/22 and TIPS revision on 05/09/22 due to persistent ascites.  Despite  weekly paracenteses since TIPS revision, he continues to report feeling much better with increased activity levels.  I am hopeful this will continue over the coming months.  Agree with continuing diuretics as tolerated by renal function.     If he continues to have significant ascites production, partial splenic embolization could be considered to aid in decreased portal hypertension as well as improve his thrombocytopenia, however  we would likely reserve this if he ultimately isn't a transplant candidate.   We will plan to follow up in IR clinic in 4-6 months to assess progress.  Marliss Coots, MD Pager: [phone] Clinic: [phone]    I spent a total of 25 Minutes in virtual telephone clinical consultation, greater than 50% of which was counseling/coordinating care for portal hypertension.

## 2022-08-10 ENCOUNTER — Encounter: Payer: Self-pay | Admitting: Gastroenterology

## 2022-08-11 ENCOUNTER — Ambulatory Visit (HOSPITAL_COMMUNITY)
Admission: RE | Admit: 2022-08-11 | Discharge: 2022-08-11 | Disposition: A | Discharge status: Home / Self Care | Payer: Medicare Other | Source: Ambulatory Visit | Attending: Gastroenterology | Admitting: Gastroenterology

## 2022-08-11 ENCOUNTER — Encounter (HOSPITAL_COMMUNITY): Payer: Self-pay

## 2022-08-11 DIAGNOSIS — D696 Thrombocytopenia, unspecified: Secondary | ICD-10-CM | POA: Diagnosis not present

## 2022-08-11 DIAGNOSIS — Z79899 Other long term (current) drug therapy: Secondary | ICD-10-CM | POA: Diagnosis not present

## 2022-08-11 DIAGNOSIS — R188 Other ascites: Secondary | ICD-10-CM | POA: Insufficient documentation

## 2022-08-11 DIAGNOSIS — E1122 Type 2 diabetes mellitus with diabetic chronic kidney disease: Secondary | ICD-10-CM | POA: Diagnosis not present

## 2022-08-11 DIAGNOSIS — I5032 Chronic diastolic (congestive) heart failure: Secondary | ICD-10-CM | POA: Diagnosis not present

## 2022-08-11 DIAGNOSIS — N189 Chronic kidney disease, unspecified: Secondary | ICD-10-CM | POA: Diagnosis not present

## 2022-08-11 DIAGNOSIS — I129 Hypertensive chronic kidney disease with stage 1 through stage 4 chronic kidney disease, or unspecified chronic kidney disease: Secondary | ICD-10-CM | POA: Diagnosis not present

## 2022-08-11 DIAGNOSIS — R809 Proteinuria, unspecified: Secondary | ICD-10-CM | POA: Diagnosis not present

## 2022-08-11 LAB — GRAM STAIN: Gram Stain: NONE SEEN

## 2022-08-11 LAB — BODY FLUID CELL COUNT WITH DIFFERENTIAL
Eos, Fluid: 0 %
Lymphs, Fluid: 30 %
Monocyte-Macrophage-Serous Fluid: 33 % — ABNORMAL LOW (ref 50–90)
Neutrophil Count, Fluid: 37 % — ABNORMAL HIGH (ref 0–25)
Total Nucleated Cell Count, Fluid: 202 cu mm (ref 0–1000)

## 2022-08-11 MED ORDER — ALBUMIN HUMAN 25 % IV SOLN
0.0000 g | Freq: Once | INTRAVENOUS | Status: AC
  Administered 2022-08-11: 50 g via INTRAVENOUS
  Filled 2022-08-11: qty 400

## 2022-08-11 MED ORDER — ALBUMIN HUMAN 25 % IV SOLN
INTRAVENOUS | Status: AC
  Filled 2022-08-11: qty 200

## 2022-08-11 NOTE — Procedures (Signed)
PROCEDURE SUMMARY:  Successful image-guided paracentesis from the right lower abdomen.  Yielded 4.9 liters of pale yellow fluid.  No immediate complications.  EBL = trace. Patient tolerated well.   Specimen was sent for labs.  Please see imaging section of Epic for full dictation.   Kennieth Francois PA-C 08/11/2022 9:10 AM

## 2022-08-11 NOTE — Progress Notes (Signed)
Patient tolerated right sided paracentesis procedure and 50G of IV albumin well today and 4.9 Liters of clear yellow ascites removed with labs collected and sent for processing. Patient verbalized understanding of discharge instructions and ambulatory at departure with no acute distress noted.

## 2022-08-13 LAB — CULTURE, BODY FLUID W GRAM STAIN -BOTTLE

## 2022-08-16 LAB — CULTURE, BODY FLUID W GRAM STAIN -BOTTLE: Culture: NO GROWTH

## 2022-08-17 DIAGNOSIS — D696 Thrombocytopenia, unspecified: Secondary | ICD-10-CM | POA: Diagnosis not present

## 2022-08-17 DIAGNOSIS — N1831 Chronic kidney disease, stage 3a: Secondary | ICD-10-CM | POA: Diagnosis not present

## 2022-08-17 DIAGNOSIS — E1122 Type 2 diabetes mellitus with diabetic chronic kidney disease: Secondary | ICD-10-CM | POA: Diagnosis not present

## 2022-08-17 DIAGNOSIS — D638 Anemia in other chronic diseases classified elsewhere: Secondary | ICD-10-CM | POA: Diagnosis not present

## 2022-08-18 ENCOUNTER — Encounter (HOSPITAL_COMMUNITY): Payer: Self-pay

## 2022-08-18 ENCOUNTER — Ambulatory Visit (HOSPITAL_COMMUNITY)
Admission: RE | Admit: 2022-08-18 | Discharge: 2022-08-18 | Disposition: A | Discharge status: Home / Self Care | Payer: Medicare Other | Source: Ambulatory Visit | Attending: Gastroenterology | Admitting: Gastroenterology

## 2022-08-18 DIAGNOSIS — R188 Other ascites: Secondary | ICD-10-CM | POA: Insufficient documentation

## 2022-08-18 DIAGNOSIS — K7469 Other cirrhosis of liver: Secondary | ICD-10-CM | POA: Diagnosis not present

## 2022-08-18 LAB — BODY FLUID CELL COUNT WITH DIFFERENTIAL
Eos, Fluid: 0 %
Lymphs, Fluid: 62 %
Monocyte-Macrophage-Serous Fluid: 6 % — ABNORMAL LOW (ref 50–90)
Neutrophil Count, Fluid: 32 % — ABNORMAL HIGH (ref 0–25)
Total Nucleated Cell Count, Fluid: 209 cu mm (ref 0–1000)

## 2022-08-18 LAB — GRAM STAIN: Gram Stain: NONE SEEN

## 2022-08-18 MED ORDER — ALBUMIN HUMAN 25 % IV SOLN
0.0000 g | Freq: Once | INTRAVENOUS | Status: AC
  Administered 2022-08-18: 25 g via INTRAVENOUS
  Filled 2022-08-18: qty 400

## 2022-08-18 MED ORDER — ALBUMIN HUMAN 25 % IV SOLN
INTRAVENOUS | Status: AC
  Filled 2022-08-18: qty 200

## 2022-08-18 NOTE — Progress Notes (Signed)
Patient tolerated right sided paracentesis and 25G of IV albumin well today and 2.5 Liters of ascites removed with labs collected and sent for processing. Patient verbalized understanding of discharge instructions and ambulatory at departure with no acute distress noted.

## 2022-08-18 NOTE — Procedures (Signed)
PROCEDURE SUMMARY:  Successful US guided diagnostic and therapeutic paracentesis from RLQ.  Yielded 2.5 L of clear, yellow fluid.  No immediate complications.  Pt tolerated well.   Specimen was sent for labs.  EBL < 1 mL  Shon Hough, AGNP 08/18/2022 10:02 AM

## 2022-08-19 ENCOUNTER — Inpatient Hospital Stay: Payer: Medicare Other | Attending: Hematology

## 2022-08-19 DIAGNOSIS — D696 Thrombocytopenia, unspecified: Secondary | ICD-10-CM | POA: Diagnosis not present

## 2022-08-19 DIAGNOSIS — E610 Copper deficiency: Secondary | ICD-10-CM

## 2022-08-19 DIAGNOSIS — D649 Anemia, unspecified: Secondary | ICD-10-CM

## 2022-08-19 LAB — SAMPLE TO BLOOD BANK

## 2022-08-19 LAB — CBC WITH DIFFERENTIAL/PLATELET
Abs Immature Granulocytes: 0.02 10*3/uL (ref 0.00–0.07)
Basophils Absolute: 0.1 10*3/uL (ref 0.0–0.1)
Basophils Relative: 1 %
Eosinophils Absolute: 0.4 10*3/uL (ref 0.0–0.5)
Eosinophils Relative: 7 %
HCT: 29.5 % — ABNORMAL LOW (ref 39.0–52.0)
Hemoglobin: 10 g/dL — ABNORMAL LOW (ref 13.0–17.0)
Immature Granulocytes: 0 %
Lymphocytes Relative: 12 %
Lymphs Abs: 0.7 10*3/uL (ref 0.7–4.0)
MCH: 33 pg (ref 26.0–34.0)
MCHC: 33.9 g/dL (ref 30.0–36.0)
MCV: 97.4 fL (ref 80.0–100.0)
Monocytes Absolute: 1 10*3/uL (ref 0.1–1.0)
Monocytes Relative: 16 %
Neutro Abs: 3.7 10*3/uL (ref 1.7–7.7)
Neutrophils Relative %: 64 %
Platelets: 116 10*3/uL — ABNORMAL LOW (ref 150–400)
RBC: 3.03 MIL/uL — ABNORMAL LOW (ref 4.22–5.81)
RDW: 15.6 % — ABNORMAL HIGH (ref 11.5–15.5)
WBC: 5.9 10*3/uL (ref 4.0–10.5)
nRBC: 0 % (ref 0.0–0.2)

## 2022-08-19 LAB — PATHOLOGIST SMEAR REVIEW

## 2022-08-19 LAB — CULTURE, BODY FLUID W GRAM STAIN -BOTTLE

## 2022-08-22 LAB — CULTURE, BODY FLUID W GRAM STAIN -BOTTLE

## 2022-08-23 LAB — CULTURE, BODY FLUID W GRAM STAIN -BOTTLE: Culture: NO GROWTH

## 2022-08-25 ENCOUNTER — Ambulatory Visit (HOSPITAL_COMMUNITY)
Admission: RE | Admit: 2022-08-25 | Discharge: 2022-08-25 | Disposition: A | Discharge status: Home / Self Care | Payer: Medicare Other | Source: Ambulatory Visit | Attending: Internal Medicine | Admitting: Internal Medicine

## 2022-08-25 ENCOUNTER — Encounter (HOSPITAL_COMMUNITY): Payer: Self-pay

## 2022-08-25 DIAGNOSIS — R188 Other ascites: Secondary | ICD-10-CM | POA: Diagnosis not present

## 2022-08-25 DIAGNOSIS — K7469 Other cirrhosis of liver: Secondary | ICD-10-CM | POA: Diagnosis not present

## 2022-08-25 LAB — BODY FLUID CELL COUNT WITH DIFFERENTIAL
Eos, Fluid: 0 %
Lymphs, Fluid: 35 %
Monocyte-Macrophage-Serous Fluid: 49 % — ABNORMAL LOW (ref 50–90)
Neutrophil Count, Fluid: 16 % (ref 0–25)
Total Nucleated Cell Count, Fluid: 350 cu mm (ref 0–1000)

## 2022-08-25 LAB — GRAM STAIN: Gram Stain: NONE SEEN

## 2022-08-25 MED ORDER — ALBUMIN HUMAN 25 % IV SOLN
INTRAVENOUS | Status: AC
  Filled 2022-08-25: qty 100

## 2022-08-25 MED ORDER — ALBUMIN HUMAN 25 % IV SOLN
0.0000 g | Freq: Once | INTRAVENOUS | Status: AC
  Administered 2022-08-25: 25 g via INTRAVENOUS

## 2022-08-25 NOTE — Progress Notes (Signed)
Patient tolerated left sided paracentesis and 25G of IV albumin well today and 2.5 Liters of clear yellow ascites removed with labs collected and sent for processing. Patient verbalized understanding of discharge instructions and ambulatory at departure with no acute distress noted.

## 2022-08-25 NOTE — Procedures (Signed)
PROCEDURE SUMMARY:  Successful US guided diagnostic and therapeutic paracentesis from LLQ.  Yielded 2.5 L of clear, yellow fluid.  No immediate complications.  Pt tolerated well.   Specimen was sent for labs.  EBL < 1 mL  Shon Hough, AGNP 08/25/2022 9:33 AM

## 2022-08-26 LAB — CULTURE, BODY FLUID W GRAM STAIN -BOTTLE

## 2022-08-29 LAB — CULTURE, BODY FLUID W GRAM STAIN -BOTTLE

## 2022-08-30 LAB — CULTURE, BODY FLUID W GRAM STAIN -BOTTLE: Culture: NO GROWTH

## 2022-08-31 ENCOUNTER — Ambulatory Visit (HOSPITAL_COMMUNITY): Payer: Medicare Other

## 2022-09-02 ENCOUNTER — Ambulatory Visit (HOSPITAL_COMMUNITY)
Admission: RE | Admit: 2022-09-02 | Discharge: 2022-09-02 | Disposition: A | Discharge status: Home / Self Care | Payer: Medicare Other | Source: Ambulatory Visit | Attending: Gastroenterology | Admitting: Gastroenterology

## 2022-09-02 DIAGNOSIS — K7460 Unspecified cirrhosis of liver: Secondary | ICD-10-CM | POA: Diagnosis not present

## 2022-09-02 DIAGNOSIS — R188 Other ascites: Secondary | ICD-10-CM | POA: Insufficient documentation

## 2022-09-02 LAB — BODY FLUID CELL COUNT WITH DIFFERENTIAL
Eos, Fluid: 1 %
Lymphs, Fluid: 41 %
Monocyte-Macrophage-Serous Fluid: 32 % — ABNORMAL LOW (ref 50–90)
Neutrophil Count, Fluid: 26 % — ABNORMAL HIGH (ref 0–25)
Total Nucleated Cell Count, Fluid: 280 cu mm (ref 0–1000)

## 2022-09-02 LAB — GRAM STAIN

## 2022-09-02 LAB — CULTURE, BODY FLUID W GRAM STAIN -BOTTLE

## 2022-09-02 NOTE — Progress Notes (Addendum)
Pt brought to [place] [facility] in no obvious distress. Procedure explained, consent obtained. Prepped and draped in sterile fashion. Local anesthetic admin without adverse reaction. Access obtained at [facility]. 2.5 L clear serous fluid removed under vacutainer suction. Pt refuses PIV access today. Access at [facility] removed, hemostasis achieved, dermabond in place with bandage. Pressure dressing applied. Escorted to radiology waiting area.

## 2022-09-03 LAB — CULTURE, BODY FLUID W GRAM STAIN -BOTTLE

## 2022-09-04 ENCOUNTER — Other Ambulatory Visit: Payer: Self-pay | Admitting: Family Medicine

## 2022-09-04 LAB — CULTURE, BODY FLUID W GRAM STAIN -BOTTLE

## 2022-09-05 ENCOUNTER — Other Ambulatory Visit: Payer: Self-pay | Admitting: Gastroenterology

## 2022-09-05 DIAGNOSIS — R188 Other ascites: Secondary | ICD-10-CM

## 2022-09-07 LAB — CULTURE, BODY FLUID W GRAM STAIN -BOTTLE: Culture: NO GROWTH

## 2022-09-09 ENCOUNTER — Other Ambulatory Visit: Payer: Self-pay | Admitting: Family Medicine

## 2022-09-09 ENCOUNTER — Encounter (HOSPITAL_COMMUNITY): Payer: Self-pay

## 2022-09-09 ENCOUNTER — Other Ambulatory Visit: Payer: Self-pay | Admitting: *Deleted

## 2022-09-09 ENCOUNTER — Ambulatory Visit (HOSPITAL_COMMUNITY): Admission: RE | Admit: 2022-09-09 | Payer: Medicare Other | Source: Ambulatory Visit

## 2022-09-09 DIAGNOSIS — R188 Other ascites: Secondary | ICD-10-CM

## 2022-09-09 LAB — BODY FLUID CELL COUNT WITH DIFFERENTIAL
Eos, Fluid: 0 %
Lymphs, Fluid: 21 %
Monocyte-Macrophage-Serous Fluid: 67 % (ref 50–90)
Neutrophil Count, Fluid: 12 % (ref 0–25)
Total Nucleated Cell Count, Fluid: 282 cu mm (ref 0–1000)

## 2022-09-09 LAB — GRAM STAIN: Gram Stain: NONE SEEN

## 2022-09-09 MED ORDER — ALBUMIN HUMAN 25 % IV SOLN
INTRAVENOUS | Status: AC
  Filled 2022-09-09: qty 100

## 2022-09-09 MED ORDER — ALBUMIN HUMAN 25 % IV SOLN
25.0000 g | Freq: Once | INTRAVENOUS | Status: AC
  Administered 2022-09-09: 25 g via INTRAVENOUS

## 2022-09-09 NOTE — Procedures (Signed)
INDICATION: Ascites EXAM: ULTRASOUND GUIDED LEFT PARACENTESIS GRIP-IR: Category: Fluids  Subcategory: Paracentesis  Follow-Up: None  MEDICATIONS: None. COMPLICATIONS: None immediate. PROCEDURE: Informed written consent was obtained from the patient after a discussion of the risks (including hemorrhage, infection, and bowel injury), benefits and alternatives to treatment. A standard timeout was performed prior to the initiation of the procedure.  Initial ultrasound scanning demonstrates a moderate amount of ascites within the left lower abdominal quadrant. The right lower abdomen was prepped and draped in the usual sterile fashion. 1% lidocaine  was used for local anesthesia.   Following this, a 19 gauge, 7-cm, Yueh catheter was introduced. An ultrasound image was saved for documentation purposes. The paracentesis was performed. The catheter was removed and a dressing was applied. The patient tolerated the procedure well without immediate post procedural complication.   FINDINGS: A total of approximately 1.8 L of clear yellow fluid was removed. Samples were sent to the laboratory as requested by the clinical team. IMPRESSION:  Successful ultrasound-guided paracentesis yielding 1.8 liters of peritoneal fluid.

## 2022-09-09 NOTE — Progress Notes (Signed)
error 

## 2022-09-09 NOTE — Progress Notes (Signed)
Patient tolerated left sided paracentesis and 25G of IV albumin well today and 1.8 Liters of clear yellow ascites removed with labs collected and sent for processing. Patient verbalized understanding of discharge instructions and ambulatory at departure with no acute distress noted.

## 2022-09-11 LAB — CULTURE, BODY FLUID W GRAM STAIN -BOTTLE: Culture: NO GROWTH

## 2022-09-12 LAB — CULTURE, BODY FLUID W GRAM STAIN -BOTTLE

## 2022-09-13 LAB — CULTURE, BODY FLUID W GRAM STAIN -BOTTLE

## 2022-09-14 LAB — CULTURE, BODY FLUID W GRAM STAIN -BOTTLE

## 2022-09-16 ENCOUNTER — Inpatient Hospital Stay: Payer: Medicare Other | Attending: Hematology

## 2022-09-16 DIAGNOSIS — Z7982 Long term (current) use of aspirin: Secondary | ICD-10-CM | POA: Diagnosis not present

## 2022-09-16 DIAGNOSIS — D696 Thrombocytopenia, unspecified: Secondary | ICD-10-CM | POA: Insufficient documentation

## 2022-09-16 DIAGNOSIS — D649 Anemia, unspecified: Secondary | ICD-10-CM | POA: Diagnosis not present

## 2022-09-16 DIAGNOSIS — E610 Copper deficiency: Secondary | ICD-10-CM

## 2022-09-16 LAB — RETICULOCYTES
Immature Retic Fract: 24.8 % — ABNORMAL HIGH (ref 2.3–15.9)
RBC.: 3.18 MIL/uL — ABNORMAL LOW (ref 4.22–5.81)
Retic Count, Absolute: 102.4 10*3/uL (ref 19.0–186.0)
Retic Ct Pct: 3.2 % — ABNORMAL HIGH (ref 0.4–3.1)

## 2022-09-16 LAB — CBC WITH DIFFERENTIAL/PLATELET
Abs Immature Granulocytes: 0.05 10*3/uL (ref 0.00–0.07)
Basophils Absolute: 0.1 10*3/uL (ref 0.0–0.1)
Basophils Relative: 1 %
Eosinophils Absolute: 0.5 10*3/uL (ref 0.0–0.5)
Eosinophils Relative: 9 %
HCT: 30.8 % — ABNORMAL LOW (ref 39.0–52.0)
Hemoglobin: 10.5 g/dL — ABNORMAL LOW (ref 13.0–17.0)
Immature Granulocytes: 1 %
Lymphocytes Relative: 11 %
Lymphs Abs: 0.6 10*3/uL — ABNORMAL LOW (ref 0.7–4.0)
MCH: 33.2 pg (ref 26.0–34.0)
MCHC: 34.1 g/dL (ref 30.0–36.0)
MCV: 97.5 fL (ref 80.0–100.0)
Monocytes Absolute: 0.8 10*3/uL (ref 0.1–1.0)
Monocytes Relative: 14 %
Neutro Abs: 3.4 10*3/uL (ref 1.7–7.7)
Neutrophils Relative %: 64 %
Platelets: 131 10*3/uL — ABNORMAL LOW (ref 150–400)
RBC: 3.16 MIL/uL — ABNORMAL LOW (ref 4.22–5.81)
RDW: 15.1 % (ref 11.5–15.5)
WBC: 5.4 10*3/uL (ref 4.0–10.5)
nRBC: 0 % (ref 0.0–0.2)

## 2022-09-16 LAB — LACTATE DEHYDROGENASE: LDH: 184 U/L (ref 98–192)

## 2022-09-16 LAB — IRON AND TIBC
Iron: 110 ug/dL (ref 45–182)
Saturation Ratios: 50 % — ABNORMAL HIGH (ref 17.9–39.5)
TIBC: 219 ug/dL — ABNORMAL LOW (ref 250–450)
UIBC: 109 ug/dL

## 2022-09-16 LAB — BILIRUBIN, FRACTIONATED(TOT/DIR/INDIR)
Bilirubin, Direct: 0.4 mg/dL — ABNORMAL HIGH (ref 0.0–0.2)
Indirect Bilirubin: 1.6 mg/dL — ABNORMAL HIGH (ref 0.3–0.9)
Total Bilirubin: 2 mg/dL — ABNORMAL HIGH (ref 0.3–1.2)

## 2022-09-16 LAB — IMMATURE PLATELET FRACTION: Immature Platelet Fraction: 0.7 % — ABNORMAL LOW (ref 1.2–8.6)

## 2022-09-16 LAB — FERRITIN: Ferritin: 46 ng/mL (ref 24–336)

## 2022-09-18 LAB — HAPTOGLOBIN: Haptoglobin: <10 mg/dL — ABNORMAL LOW (ref 32–363)

## 2022-09-19 LAB — COPPER, SERUM: Copper: 66 ug/dL — ABNORMAL LOW (ref 69–132)

## 2022-09-20 ENCOUNTER — Encounter (HOSPITAL_COMMUNITY): Payer: Self-pay

## 2022-09-20 ENCOUNTER — Ambulatory Visit (HOSPITAL_COMMUNITY)
Admission: RE | Admit: 2022-09-20 | Discharge: 2022-09-20 | Disposition: A | Discharge status: Home / Self Care | Payer: Medicare Other | Source: Ambulatory Visit | Attending: Gastroenterology | Admitting: Gastroenterology

## 2022-09-20 DIAGNOSIS — R188 Other ascites: Secondary | ICD-10-CM | POA: Insufficient documentation

## 2022-09-20 LAB — BODY FLUID CELL COUNT WITH DIFFERENTIAL
Eos, Fluid: 0 %
Lymphs, Fluid: 34 %
Monocyte-Macrophage-Serous Fluid: 44 % — ABNORMAL LOW (ref 50–90)
Neutrophil Count, Fluid: 22 % (ref 0–25)
Total Nucleated Cell Count, Fluid: 290 cu mm (ref 0–1000)

## 2022-09-20 LAB — GRAM STAIN: Gram Stain: NONE SEEN

## 2022-09-20 MED ORDER — ALBUMIN HUMAN 25 % IV SOLN
0.0000 g | Freq: Once | INTRAVENOUS | Status: AC
  Administered 2022-09-20: 25 g via INTRAVENOUS
  Filled 2022-09-20: qty 400

## 2022-09-20 MED ORDER — ALBUMIN HUMAN 25 % IV SOLN
INTRAVENOUS | Status: AC
  Filled 2022-09-20: qty 100

## 2022-09-20 NOTE — Progress Notes (Signed)
Patient tolerated right sided paracentesis and 25G of IV albumin well today and 2.1 Liters of clear yellow ascites removed with labs collected and sent for processing. Patient verbalized understanding of discharge instructions and ambulatory at departure with no acute distress noted.

## 2022-09-24 LAB — CULTURE, BODY FLUID W GRAM STAIN -BOTTLE

## 2022-09-25 LAB — CULTURE, BODY FLUID W GRAM STAIN -BOTTLE: Culture: NO GROWTH

## 2022-09-26 LAB — PATHOLOGIST SMEAR REVIEW

## 2022-09-27 ENCOUNTER — Other Ambulatory Visit: Payer: Self-pay | Admitting: Gastroenterology

## 2022-09-27 ENCOUNTER — Telehealth (INDEPENDENT_AMBULATORY_CARE_PROVIDER_SITE_OTHER): Payer: Self-pay | Admitting: *Deleted

## 2022-09-27 DIAGNOSIS — K7682 Hepatic encephalopathy: Secondary | ICD-10-CM

## 2022-09-27 MED ORDER — LACTULOSE 10 GM/15ML PO SOLN
20.0000 g | Freq: Three times a day (TID) | ORAL | 5 refills | Status: DC
Start: 2022-09-27 — End: 2023-03-07

## 2022-09-27 NOTE — Telephone Encounter (Signed)
Spoke to pt, informed prescription was sent to pharmacy. Pt voiced understanding.

## 2022-09-27 NOTE — Telephone Encounter (Signed)
I will go ahead and refill his lactulose. The issues with the smaller bottles is coming from the pharmacy. They dispense what ever they have.

## 2022-09-27 NOTE — Telephone Encounter (Signed)
Wife called [facility] 8:05  He will be out of his Lactulose before refill date of 10/15/2020  She asked for call back to her husband and if you can't reach him call her at [phone].  Called patient --he has 4 "small" bottles left and he said a bottle doesn't go as far and usually he uses a bottle which he takes 1 3/4 cup out of.  He knows he will be out of this before 8/11.  They will not refill unless the office tells them to refill.  He says he thinks if refills sent in for larger bottles but he understands the insurance may not approve.   Uses [facility]  Last seen [facility] 06/22/2022

## 2022-09-29 ENCOUNTER — Ambulatory Visit (HOSPITAL_COMMUNITY): Admission: RE | Admit: 2022-09-29 | Payer: Medicare Other | Source: Ambulatory Visit

## 2022-09-30 ENCOUNTER — Ambulatory Visit (HOSPITAL_COMMUNITY): Payer: Medicare Other

## 2022-09-30 NOTE — Progress Notes (Unsigned)
[facility] [address] [place], [place] [address]   CLINIC:  Medical Oncology/Hematology  PCP:  Babs Sciara, MD [address] [place] [address] [phone]   REASON FOR VISIT:  Follow-up for normocytic anemia and thrombocytopenia  CURRENT THERAPY: Copper supplementation  INTERVAL HISTORY:   Shawn [name] 67 y.o. male returns for routine follow-up of normocytic anemia and thrombocytopenia.  He was last seen by Rojelio Brenner PA-C on 07/19/2022.   At today's visit, he reports feeling fair.  He feels that his energy has improved since his last visit, which he attributes to copper supplementation and fluid pills.  He reports decreased swelling and decreased need for paracentesis (once every 2 weeks now, instead of 2-3 times weekly).  He reports decreased brain fog.  He is working on getting scheduled for EGD/colonoscopy with local GI, and is being evaluated by NP Annamarie Major ([facility] team) for possible transplant.  He denies any major bleeding events such as rectal bleeding, melena, or epistaxis.  He reports easy bruising and notes that he is also on aspirin 81 mg daily.  He denies any pagophagia but reports that he frequently eats popsicles.  He reports 85% energy and 100% appetite.  ASSESSMENT & PLAN:  1.  Normocytic anemia - PMH: NASH cirrhosis +/- EtOH, TIPS placement, CKD - CBC/differential from 05/25/2022 showed severe anemia with Hgb 8.4/MCV 96.2 - No prior transfusion history.  No prior parenteral iron therapy.  He is not on oral iron therapy.  No ice pica. - Multifactorial etiology of anemia with differential including: Anemia from CKD stage IIIb (GFR 43 based on 2021 CKD-EPI Creatinine-Cystatin C), with creatinine 1.29 and Cystatin C 1.95 Copper deficiency (copper level at 40 as of 05/18/2022).  Other labs showed normal B12, folate, MMA. At risk for GI bleeding due to liver cirrhosis: Denies any rectal bleeding or melena, but has never had  previous EGD or colonoscopy.  Hemoccult stool negative x 3 (05/2022), but reticulocytes elevated at 3.3%.  Hemolysis possible - haptoglobin < 10, reticulocytes elevated at 3.3%, elevations in both direct and indirect bilirubin (cirrhosis).  Normal LDH and DAT. Difficult to assess hemolysis.  Haptoglobin may be falsely low from liver cirrhosis, and bilirubin is unreliable.  Normal LDH is reassuring.  Reticulocytosis could be compensation for possible occult GI bleeding. No evidence of plasma cell dyscrasia: Immunofixation shows polyclonal increase in immunoglobulins.  SPEP negative for M spike.  24-hour urine immunofixation was unremarkable.  Kappa light chains 101.3, elevated lambda light chains 84.5, and normal FLC ratio 1.20.   - He has been taking copper supplement since April 2024 - Most recent CBC/D (09/16/2022): Hgb 10.5/MCV 97.5 Ferritin 46, iron saturation 50% with low TIBC 219 Copper remains mildly low at 66 (improved from 40) - No rectal bleeding or melena  - PLAN: Increase copper supplementation to 8 mg daily (2 mg tablet x 4) - Increase to ferrous sulfate 325 mg every day.  - Consider starting Retacrit if Hgb < 10.0 despite optimization of iron and copper - Recommend GI follow-up for EGD/colonoscopy  - Will recheck CBC and iron panel in 3 months (October/November 2024)  2.  Mild thrombocytopenia - PMH: NASH cirrhosis +/- EtOH, TIPS placement, CKD - Ultrasound spleen (10/15/2021): Splenomegaly measuring 12 x 14 x 6 cm, volume 550 cm - Admits to easy bruising.  Denies any bleeding per rectum or melena. - Hematology workup (05/18/2022): No evidence of acute hepatitis B or hepatitis C.  Normal  RF and ANA. - Copper significantly low at 40 (05/18/2022).  Normal B12, folate, MMA. - Most recent CBC (05/26/2022): Platelets 128 - Immature platelet fraction is LOW at 0.7 - even after copper levels have improved - Most recent CBC/differential (09/16/2022) with platelets 131.  Normal WBC 5.4, with  mild lymphopenia (ALC 0.6) - DIFFERENTIAL DIAGNOSIS: Most likely from splenic sequestration and copper deficiency.  However, with low immature platelet fraction, bone marrow disorder remains within the differential. - PLAN: Increase copper supplementation to 8 mg daily (2 mg tablet x 4) We will monitor copper levels carefully due to caution needed in copper supplementation of cirrhotic patients. Since copper is primarily eliminated in bile, patients with hepatic disease may retain copper, and excessive intake of copper may lead to hepatotoxicity. We will adjust supplementation based on serum copper concentrations. - Although thrombocytopenia could be entirely due to liver cirrhosis and splenomegaly, his persistently low immature platelet fraction raises the question of MDS or other bone marrow disorder.  Since immature platelet fraction has failed to improve despite copper supplementation, recommend proceeding with bone marrow biopsy - RTC 1 week after bone marrow biopsy to discuss results  3.  Social/family history: - Lives at home with his wife.  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  Non-smoker.  Exposure to some cleaning chemicals. - No family history of transfusion dependent anemia.  Mother had breast cancer and colon cancer.  Father had cancer.  Half sister had breast cancer.  Brother and sister had cirrhosis.   PLAN SUMMARY:  >> CT-guided bone marrow biopsy at [facility] >> OFFICE visit 1 week after BMBx     REVIEW OF SYSTEMS:  Review of Systems  Constitutional:  Positive for fatigue. Negative for appetite change, chills, diaphoresis, fever and unexpected weight change.  HENT:   Negative for lump/mass, nosebleeds and trouble swallowing.   Eyes:  Negative for eye problems.  Respiratory:  Negative for cough, hemoptysis and shortness of breath.   Cardiovascular:  Positive for leg swelling. Negative for chest pain and palpitations.   Gastrointestinal:  Positive for abdominal distention and diarrhea. Negative for abdominal pain, blood in stool, constipation, nausea and vomiting.  Genitourinary:  Positive for frequency. Negative for hematuria.   Skin: Negative.   Neurological:  Negative for dizziness, headaches and light-headedness.  Hematological:  Does not bruise/bleed easily.  Psychiatric/Behavioral:  Positive for sleep disturbance.      PHYSICAL EXAM:  ECOG PERFORMANCE STATUS: 1 - Symptomatic but completely ambulatory  There were no vitals filed for this visit. There were no vitals filed for this visit. Physical Exam Constitutional:      Appearance: Normal appearance. He is obese.  Cardiovascular:     Heart sounds: No murmur (no evidence of previously noted questionable faint systolic murmur at RUSB) heard. Pulmonary:     Breath sounds: Normal breath sounds.  Neurological:     General: No focal deficit present.     Mental Status: Mental status is at baseline.  Psychiatric:        Behavior: Behavior normal. Behavior is cooperative.     PAST MEDICAL/SURGICAL HISTORY:  Past Medical History:  Diagnosis Date   ADD (attention deficit disorder)    ADHD    Anxiety 1987   mild (crowds, noises)   Anxiety    Attention deficit disorder    Cancer (HCC)    Cirrhosis of liver (HCC)    CVA (cerebral vascular accident) (HCC)    Depression  Diabetic neuropathy (HCC)    Essential hypertension, benign    Long-standing history, negative secondary workup   Headache    History of cardiac catheterization    Widely patent coronary and renal arteries November 2013   Hyperlipidemia    Hypertension    whitecoat   Obstructive sleep apnea    cpap   Prediabetes    Sleep apnea    TIA (transient ischemic attack)    Possible, 2007   Type 2 diabetes mellitus (HCC)    Venous stasis    Past Surgical History:  Procedure Laterality Date   ANTERIOR CERVICAL DECOMP/DISCECTOMY FUSION N/A 12/30/2015   Procedure: ANTERIOR  CERVICAL DECOMPRESSION/DISCECTOMY FUSION C6 - C7 1 LEVEL;  Surgeon: Venita Lick, MD;  Location: [facility] OR;  Service: Orthopedics;  Laterality: N/A;   CARDIAC CATHETERIZATION  01/2012   negative   CARPAL TUNNEL RELEASE     CERVICAL SPINE NERVE BLOCK  05/17/2017   IR INTRAVASCULAR ULTRASOUND NON CORONARY  02/14/2022   IR PARACENTESIS  02/14/2022   IR PARACENTESIS  05/09/2022   IR RADIOLOGIST EVAL & MGMT  12/03/2021   IR RADIOLOGIST EVAL & MGMT  01/13/2022   IR RADIOLOGIST EVAL & MGMT  03/24/2022   IR RADIOLOGIST EVAL & MGMT  04/20/2022   IR RADIOLOGIST EVAL & MGMT  06/08/2022   IR RADIOLOGIST EVAL & MGMT  08/09/2022   IR TIPS  02/14/2022   IR TIPS REVISION MOD SED  05/09/2022   IR US GUIDE VASC ACCESS RIGHT  02/14/2022   IR US GUIDE VASC ACCESS RIGHT  02/14/2022   IR US GUIDE VASC ACCESS RIGHT  05/09/2022   KNEE SURGERY Right    NECK SURGERY     RADIOLOGY WITH ANESTHESIA N/A 02/14/2022   Procedure: TIPS;  Surgeon: Bennie Dallas, MD;  Location: [facility] OR;  Service: Radiology;  Laterality: N/A;   VASECTOMY      SOCIAL HISTORY:  Social History   Socioeconomic History   Marital status: Married    Spouse name: Not on file   Number of children: 4   Years of education: Not on file   Highest education level: 9th grade  Occupational History   Occupation: Systems developer   Tobacco Use   Smoking status: Never   Smokeless tobacco: Current    Types: Snuff   Tobacco comments:    Uses dip pouches occasionally  Vaping Use   Vaping status: Never Used  Substance and Sexual Activity   Alcohol use: No    Alcohol/week: 0.0 standard drinks of alcohol    Comment: Previously 1-2 cases of beer/week. patient states never on regular basis though. last etoh in 2013.    Drug use: No   Sexual activity: Not Currently  Other Topics Concern   Not on file  Social History Narrative   ** Merged History Encounter **       Social Determinants of Health   Financial Resource Strain: Patient Declined (07/09/2022)   Overall  Financial Resource Strain (CARDIA)    Difficulty of Paying Living Expenses: Patient declined  Food Insecurity: Patient Declined (07/09/2022)   Hunger Vital Sign    Worried About Running Out of Food in the Last Year: Patient declined    Ran Out of Food in the Last Year: Patient declined  Transportation Needs: Unmet Transportation Needs (07/09/2022)   PRAPARE - Administrator, Civil Service (Medical): Yes    Lack of Transportation (Non-Medical): No  Physical Activity: Unknown (07/09/2022)   Exercise Vital Sign  Days of Exercise per Week: 1 day    Minutes of Exercise per Session: Patient declined  Stress: Stress Concern Present (07/09/2022)   [employer] of Occupational Health - Occupational Stress Questionnaire    Feeling of Stress : Very much  Social Connections: Unknown (07/09/2022)   Social Connection and Isolation Panel [NHANES]    Frequency of Communication with Friends and Family: More than three times a week    Frequency of Social Gatherings with Friends and Family: Once a week    Attends Religious Services: Patient declined    Database administrator or Organizations: No    Attends Banker Meetings: Never    Marital Status: Married  Catering manager Violence: Not At Risk (05/24/2022)   Humiliation, Afraid, Rape, and Kick questionnaire    Fear of Current or Ex-Partner: No    Emotionally Abused: No    Physically Abused: No    Sexually Abused: No    FAMILY HISTORY:  Family History  Problem Relation Age of Onset   Colon cancer Mother    Hypertension Mother    CAD Mother    Breast cancer Mother    Cirrhosis Brother     CURRENT MEDICATIONS:  Outpatient Encounter Medications as of 10/03/2022  Medication Sig   ascorbic Acid (VITAMIN C) 500 MG CPCR Take 500 mg by mouth daily.   aspirin EC 81 MG tablet Take 81 mg by mouth daily. Swallow whole.   Blood Glucose Monitoring Suppl DEVI 1 each by Does not apply route in the morning, at noon, and at bedtime.  May substitute to any manufacturer covered by patient's insurance Dx E11.22   Cholecalciferol (D3 VITAMIN PO) Take by mouth daily. 5000 IU daily   Copper Gluconate 2 MG TABS TAKE 2 AND 1/2 TABLETS (5 MG TOTAL) BY MOUTH 2 (TWO) TIMES DAILY FOR 7 DAYS, THEN 2 AND 1/2 TABLETS (5 MG TOTAL) DAILY.   dorzolamide (TRUSOPT) 2 % ophthalmic solution 1 drop 2 (two) times daily.   fluticasone (FLONASE) 50 MCG/ACT nasal spray SPRAY 2 SPRAYS INTO EACH NOSTRIL EVERY DAY   Glucose Blood (BLOOD GLUCOSE TEST STRIPS) STRP Use to check blood sugar once daily before meals. May substitute to any manufacturer covered by patient's insurance E11.22   lactulose (CHRONULAC) 10 GM/15ML solution Take 30 mLs (20 g total) by mouth 3 (three) times daily.   Lancet Device MISC Use to check blood sugar once daily before meals. May substitute to any manufacturer covered by patient's insurance E11.22   Lancets Misc. MISC Use to check blood sugar once daily before meals. May substitute to any manufacturer covered by patient's insurance   metFORMIN (GLUCOPHAGE) 500 MG tablet TAKE 1 TABLET BY MOUTH EVERY DAY   Omega-3 Fatty Acids (FISH OIL) 1000 MG CAPS Take by mouth daily.   OVER THE COUNTER MEDICATION Copper supplement   pantoprazole (PROTONIX) 40 MG tablet TAKE 1 TABLET BY MOUTH EVERY DAY   rifaximin (XIFAXAN) 550 MG TABS tablet Take 1 tablet (550 mg total) by mouth 2 (two) times daily.   sodium bicarbonate 650 MG tablet Take 650 mg by mouth 3 (three) times daily. Morning, evening, bedtime   spironolactone (ALDACTONE) 25 MG tablet Take 25 mg by mouth daily.   torsemide (DEMADEX) 10 MG tablet Take 10 mg by mouth every other day. For two weeks   venlafaxine XR (EFFEXOR-XR) 150 MG 24 hr capsule TAKE 1 CAPSULE BY MOUTH TWICE A DAY   zinc gluconate 50 MG tablet Take 50 mg  by mouth daily.   No facility-administered encounter medications on file as of 10/03/2022.    ALLERGIES:  No Known Allergies  LABORATORY DATA:  I have reviewed  the labs as listed.  CBC    Component Value Date/Time   WBC 5.4 09/16/2022 0947   RBC 3.18 (L) 09/16/2022 0947   RBC 3.16 (L) 09/16/2022 0947   HGB 10.5 (L) 09/16/2022 0947   HGB 11.2 (L) 05/03/2022 0903   HCT 30.8 (L) 09/16/2022 0947   HCT 32.7 (L) 05/03/2022 0903   PLT 131 (L) 09/16/2022 0947   PLT 164 05/03/2022 0903   MCV 97.5 09/16/2022 0947   MCV 92 05/03/2022 0903   MCH 33.2 09/16/2022 0947   MCHC 34.1 09/16/2022 0947   RDW 15.1 09/16/2022 0947   RDW 13.7 05/03/2022 0903   LYMPHSABS 0.6 (L) 09/16/2022 0947   LYMPHSABS 1.0 05/03/2022 0903   MONOABS 0.8 09/16/2022 0947   EOSABS 0.5 09/16/2022 0947   EOSABS 0.6 (H) 05/03/2022 0903   BASOSABS 0.1 09/16/2022 0947   BASOSABS 0.1 05/03/2022 0903      Latest Ref Rng & Units 09/16/2022    9:47 AM 07/05/2022    8:12 AM 05/26/2022    4:38 AM  CMP  Glucose 70 - 99 mg/dL   161   BUN 8 - 23 mg/dL   21   Creatinine 0.96 - 1.24 mg/dL   0.45   Sodium 409 - 811 mmol/L   142   Potassium 3.5 - 5.1 mmol/L   3.6   Chloride 98 - 111 mmol/L   110   CO2 22 - 32 mmol/L   26   Calcium 8.9 - 10.3 mg/dL   8.7   Total Protein 6.5 - 8.1 g/dL   5.5   Total Bilirubin 0.3 - 1.2 mg/dL 2.0  1.2  1.9   Alkaline Phos 38 - 126 U/L   105   AST 15 - 41 U/L   47   ALT 0 - 44 U/L   24     DIAGNOSTIC IMAGING:  I have independently reviewed the relevant imaging and discussed with the patient.   WRAP UP:  All questions were answered. The patient knows to call the clinic with any problems, questions or concerns.  Medical decision making: Moderate  Time spent on visit: I spent 20 minutes counseling the patient face to face. The total time spent in the appointment was 30 minutes and more than 50% was on counseling.  Carnella Guadalajara, PA-C  10/03/22 9:48 AM

## 2022-10-03 ENCOUNTER — Inpatient Hospital Stay: Payer: Medicare Other | Admitting: Physician Assistant

## 2022-10-03 VITALS — BP 114/76 | HR 72 | Temp 98.5°F | Resp 16 | Wt 211.6 lb

## 2022-10-03 DIAGNOSIS — D696 Thrombocytopenia, unspecified: Secondary | ICD-10-CM | POA: Diagnosis not present

## 2022-10-03 DIAGNOSIS — D649 Anemia, unspecified: Secondary | ICD-10-CM | POA: Diagnosis not present

## 2022-10-03 DIAGNOSIS — Z7982 Long term (current) use of aspirin: Secondary | ICD-10-CM | POA: Diagnosis not present

## 2022-10-03 NOTE — Patient Instructions (Addendum)
[facility] **VISIT SUMMARY & IMPORTANT INSTRUCTIONS **   You were seen today by Rojelio Brenner PA-C for your anemia and low platelets (thrombocytopenia).    LOW PLATELETS This may be related to your enlarged spleen from your liver cirrhosis. This may also be related to your copper deficiency. However, since some of your labs are still remaining abnormal and unexplained ("immature platelet fraction") despite copper supplementation, we will check BONE MARROW BIOPSY to make sure you do not have any other abnormalities at this time. **Please see attached handout for important information regarding bone marrow biopsy.  ANEMIA Your anemia is related to copper deficiency, chronic kidney disease, and possible blood loss.  MEDICATIONS: - Increase daily copper supplement to 8 mg daily (if the dose of your copper pill is 2 mg each, take 4 tablets). - Increase your iron supplement (ferrous sulfate 325 mg) to every day.  FOLLOW-UP APPOINTMENT: Office visit 1 week after bone marrow biopsy  ** Thank you for trusting me with your healthcare!  I strive to provide all of my patients with quality care at each visit.  If you receive a survey for this visit, I would be so grateful to you for taking the time to provide feedback.  Thank you in advance!  ~ Marua Qin                   Dr. Doreatha Massed   &   Rojelio Brenner, PA-C   - - - - - - - - - - - - - - - - - -    Thank you for choosing [facility] at [facility] to provide your oncology and hematology care.  To afford each patient quality time with our provider, please arrive at least 15 minutes before your scheduled appointment time.   If you have a lab appointment with the Cancer Center please come in thru the Main Entrance and check in at the main information desk.  You need to re-schedule your appointment should you arrive 10 or more minutes late.  We strive to give you quality  time with our providers, and arriving late affects you and other patients whose appointments are after yours.  Also, if you no show three or more times for appointments you may be dismissed from the clinic at the providers discretion.     Again, thank you for choosing [facility].  Our hope is that these requests will decrease the amount of time that you wait before being seen by our physicians.       _____________________________________________________________  Should you have questions after your visit to [facility], please contact our office at [phone] and follow the prompts.  Our office hours are 8:00 a.m. and 4:30 p.m. Monday - Friday.  Please note that voicemails left after 4:00 p.m. may not be returned until the following business day.  We are closed weekends and major holidays.  You do have access to a nurse 24-7, just call the main number to the clinic [phone] and do not press any options, hold on the line and a nurse will answer the phone.    For prescription refill requests, have your pharmacy contact our office and allow 72 hours.

## 2022-10-05 ENCOUNTER — Telehealth: Payer: Self-pay | Admitting: Physician Assistant

## 2022-10-05 ENCOUNTER — Ambulatory Visit (HOSPITAL_COMMUNITY)
Admission: RE | Admit: 2022-10-05 | Discharge: 2022-10-05 | Disposition: A | Discharge status: Home / Self Care | Payer: Medicare Other | Source: Ambulatory Visit | Attending: Gastroenterology | Admitting: Gastroenterology

## 2022-10-05 ENCOUNTER — Encounter: Payer: Self-pay | Admitting: Physician Assistant

## 2022-10-05 ENCOUNTER — Encounter (HOSPITAL_COMMUNITY): Payer: Self-pay

## 2022-10-05 DIAGNOSIS — R188 Other ascites: Secondary | ICD-10-CM | POA: Diagnosis not present

## 2022-10-05 LAB — GRAM STAIN: Gram Stain: NONE SEEN

## 2022-10-05 LAB — BODY FLUID CELL COUNT WITH DIFFERENTIAL
Eos, Fluid: 0 %
Lymphs, Fluid: 47 %
Monocyte-Macrophage-Serous Fluid: 39 % — ABNORMAL LOW (ref 50–90)
Neutrophil Count, Fluid: 14 % (ref 0–25)
Total Nucleated Cell Count, Fluid: 319 cu mm (ref 0–1000)

## 2022-10-05 MED ORDER — ALBUMIN HUMAN 25 % IV SOLN
INTRAVENOUS | Status: AC
  Filled 2022-10-05: qty 100

## 2022-10-05 MED ORDER — ALBUMIN HUMAN 25 % IV SOLN
25.0000 g | Freq: Once | INTRAVENOUS | Status: AC
  Administered 2022-10-05: 25 g via INTRAVENOUS

## 2022-10-05 NOTE — Procedures (Signed)
Interventional Radiology Procedure Note  Procedure: Image guided paracentesis  Complications: None  EBL: None Sample: sent  Recommendations:   - routine wound care  Signed,  Yvone Neu. Loreta Ave, DO

## 2022-10-05 NOTE — Telephone Encounter (Signed)
Called and spoke with Mr. Mathiesen today to clarify whether or not he is taking the zinc that is listed on his home medication list.  We discussed that if he is taking zinc, this could be interfering with his body's ability to absorb copper, and could be causing his issues with copper deficiency.  Mr. Romberg is uncertain as to whether or not he is taking zinc supplement, since "his wife handles all of that."  Patient's wife is not available to discuss via phone, but I will send MyChart message with details.  Mr. Rutkowski will ask his wife to review MyChart message and respond accordingly.  Rojelio Brenner PA-C 10/05/2022 4:44 PM

## 2022-10-05 NOTE — Progress Notes (Signed)
Patient tolerated left sided paracentesis and 25G of IV albumin well today and 1.8 Liters of clear yellow ascites removed with labs collected and sent for processing. Patient verbalized understanding of discharge instructions and ambulatory at departure with wife with no acute distress noted.

## 2022-10-10 LAB — HM DIABETES EYE EXAM

## 2022-10-26 ENCOUNTER — Telehealth: Payer: Self-pay | Admitting: Family Medicine

## 2022-10-26 ENCOUNTER — Encounter: Payer: Self-pay | Admitting: Family Medicine

## 2022-10-26 NOTE — Telephone Encounter (Signed)
I sent the patient a MyChart message-we will do a phone visit with him Thursday afternoon Thursday evening or Friday-as a follow-up to his [facility] visit

## 2022-10-29 NOTE — Telephone Encounter (Signed)
We had a good discussion.  Patient will be seeing [facility] on September 6 then following up with [place] on September 9 he will bring with him the check list of things they want him to get done and we will discuss immunizations in more detail when he follows up

## 2022-11-07 ENCOUNTER — Other Ambulatory Visit: Payer: Self-pay | Admitting: Family Medicine

## 2022-11-08 ENCOUNTER — Inpatient Hospital Stay: Payer: Medicare Other | Admitting: Physician Assistant

## 2022-11-09 DIAGNOSIS — K7581 Nonalcoholic steatohepatitis (NASH): Secondary | ICD-10-CM | POA: Diagnosis not present

## 2022-11-09 DIAGNOSIS — K7460 Unspecified cirrhosis of liver: Secondary | ICD-10-CM | POA: Diagnosis not present

## 2022-11-09 DIAGNOSIS — K7682 Hepatic encephalopathy: Secondary | ICD-10-CM | POA: Diagnosis not present

## 2022-11-09 DIAGNOSIS — R188 Other ascites: Secondary | ICD-10-CM | POA: Diagnosis not present

## 2022-11-10 NOTE — Telephone Encounter (Signed)
Nurses-please verify medications with patient.  If unable to verify with patient please asked the patient to bring in a list of his current medicines or the actual pill bottles he has an appointment coming up in just a few days

## 2022-11-11 DIAGNOSIS — K7581 Nonalcoholic steatohepatitis (NASH): Secondary | ICD-10-CM | POA: Diagnosis not present

## 2022-11-11 DIAGNOSIS — R6889 Other general symptoms and signs: Secondary | ICD-10-CM | POA: Diagnosis not present

## 2022-11-11 DIAGNOSIS — K7460 Unspecified cirrhosis of liver: Secondary | ICD-10-CM | POA: Diagnosis not present

## 2022-11-11 DIAGNOSIS — K7682 Hepatic encephalopathy: Secondary | ICD-10-CM | POA: Diagnosis not present

## 2022-11-11 DIAGNOSIS — Z01818 Encounter for other preprocedural examination: Secondary | ICD-10-CM | POA: Diagnosis not present

## 2022-11-11 DIAGNOSIS — I6523 Occlusion and stenosis of bilateral carotid arteries: Secondary | ICD-10-CM | POA: Diagnosis not present

## 2022-11-11 NOTE — Telephone Encounter (Signed)
Called pt to go over medication, no answer left message

## 2022-11-14 ENCOUNTER — Encounter: Payer: Self-pay | Admitting: *Deleted

## 2022-11-14 ENCOUNTER — Encounter: Payer: Self-pay | Admitting: Family Medicine

## 2022-11-14 ENCOUNTER — Ambulatory Visit (INDEPENDENT_AMBULATORY_CARE_PROVIDER_SITE_OTHER): Payer: Medicare Other | Admitting: Family Medicine

## 2022-11-14 VITALS — BP 112/62 | HR 82 | Temp 98.8°F | Ht 65.0 in | Wt 213.8 lb

## 2022-11-14 DIAGNOSIS — E785 Hyperlipidemia, unspecified: Secondary | ICD-10-CM

## 2022-11-14 DIAGNOSIS — N1832 Chronic kidney disease, stage 3b: Secondary | ICD-10-CM

## 2022-11-14 DIAGNOSIS — E1169 Type 2 diabetes mellitus with other specified complication: Secondary | ICD-10-CM | POA: Diagnosis not present

## 2022-11-14 DIAGNOSIS — Z7984 Long term (current) use of oral hypoglycemic drugs: Secondary | ICD-10-CM | POA: Diagnosis not present

## 2022-11-14 DIAGNOSIS — Z23 Encounter for immunization: Secondary | ICD-10-CM | POA: Diagnosis not present

## 2022-11-14 DIAGNOSIS — E1122 Type 2 diabetes mellitus with diabetic chronic kidney disease: Secondary | ICD-10-CM | POA: Diagnosis not present

## 2022-11-14 DIAGNOSIS — I10 Essential (primary) hypertension: Secondary | ICD-10-CM

## 2022-11-14 DIAGNOSIS — K7460 Unspecified cirrhosis of liver: Secondary | ICD-10-CM

## 2022-11-14 DIAGNOSIS — Z1211 Encounter for screening for malignant neoplasm of colon: Secondary | ICD-10-CM

## 2022-11-14 NOTE — Progress Notes (Addendum)
   Subjective:    Patient ID: Shawn Das., male    DOB: [birth year], 67 y.o.   MRN: [id number]  HPI Pt comes in today for 4 month follow up, no concerns at this time. Patient has cirrhosis Cirrhosis, non-alcoholic (HCC)  Type 2 diabetes mellitus with stage 3b chronic kidney disease, without long-term current use of insulin (HCC) - Plan: Microalbumin/Creatinine Ratio, Urine, Hemoglobin A1c  Hyperlipidemia associated with type 2 diabetes mellitus (HCC) - Plan: Lipid Panel  Essential hypertension, benign - Plan: Microalbumin/Creatinine Ratio, Urine  Immunization due - Plan: Flu Vaccine Trivalent High Dose (Fluad), Pneumococcal conjugate vaccine 20-valent (Prevnar 20)  Screening for colon cancer - Plan: Ambulatory referral to Gastroenterology  [facility] in [place] they did a large amount of lab work pulmonary function test blood gas and scans we reviewed over all of these results the majority of them look very good there were a couple tests I encouraged him to speak with their transplant specialist about Patient for blood pressure check up.  The patient does have hypertension.   Patient relates dietary measures try to minimize salt The importance of healthy diet and activity were discussed Patient relates compliance  Patient here for follow-up regarding cholesterol.    Patient relates taking medication on a regular basis Denies problems with medication Importance of dietary measures discussed Regular lab work regarding lipid and liver was checked and if needing additional labs was appropriately ordered The patient was seen today as part of a comprehensive diabetic check up. Patient has diabetes Patient relates good compliance with taking the medication. We discussed their diet and exercise activities  We also discussed the importance of notifying us if any excessively high glucoses or low sugars.     Review of Systems     Objective:   Physical  Exam General-in no acute distress Eyes-no discharge Lungs-respiratory rate normal, CTA CV-no murmurs,RRR Extremities skin warm dry no edema Neuro grossly normal Behavior normal, alert        Assessment & Plan:   1. Cirrhosis, non-alcoholic (HCC) Under the care of a local gastroenterology also under the care of of [facility] They want him to have a EGD and colonoscopy we will send notification to GI regarding this we did review over the large number of tests that he had done at [facility] and answered his many questions as we could  2. Type 2 diabetes mellitus with stage 3b chronic kidney disease, without long-term current use of insulin (HCC) Continue metformin check A1c - Microalbumin/Creatinine Ratio, Urine - Hemoglobin A1c  3. Hyperlipidemia associated with type 2 diabetes mellitus (HCC) Check lipid profile statins contraindicated - Lipid Panel  4. Essential hypertension, benign Blood pressure good control continue current measures - Microalbumin/Creatinine Ratio, Urine  5. Immunization due Today - Flu Vaccine Trivalent High Dose (Fluad) - Pneumococcal conjugate vaccine 20-valent (Prevnar 20)  6. Screening for colon cancer Screening gastroenterology - Ambulatory referral to Gastroenterology  Follow-up within 5 to 6 months months

## 2022-11-15 ENCOUNTER — Encounter: Payer: Self-pay | Admitting: Physician Assistant

## 2022-11-15 NOTE — Telephone Encounter (Signed)
Patient reports will have his wife give [place] a call to verify medications

## 2022-11-18 DIAGNOSIS — E785 Hyperlipidemia, unspecified: Secondary | ICD-10-CM | POA: Diagnosis not present

## 2022-11-18 DIAGNOSIS — I10 Essential (primary) hypertension: Secondary | ICD-10-CM | POA: Diagnosis not present

## 2022-11-18 DIAGNOSIS — I5032 Chronic diastolic (congestive) heart failure: Secondary | ICD-10-CM | POA: Diagnosis not present

## 2022-11-18 DIAGNOSIS — N1831 Chronic kidney disease, stage 3a: Secondary | ICD-10-CM | POA: Diagnosis not present

## 2022-11-18 DIAGNOSIS — D638 Anemia in other chronic diseases classified elsewhere: Secondary | ICD-10-CM | POA: Diagnosis not present

## 2022-11-18 DIAGNOSIS — D696 Thrombocytopenia, unspecified: Secondary | ICD-10-CM | POA: Diagnosis not present

## 2022-11-18 DIAGNOSIS — E1169 Type 2 diabetes mellitus with other specified complication: Secondary | ICD-10-CM | POA: Diagnosis not present

## 2022-11-18 DIAGNOSIS — N189 Chronic kidney disease, unspecified: Secondary | ICD-10-CM | POA: Diagnosis not present

## 2022-11-18 DIAGNOSIS — I129 Hypertensive chronic kidney disease with stage 1 through stage 4 chronic kidney disease, or unspecified chronic kidney disease: Secondary | ICD-10-CM | POA: Diagnosis not present

## 2022-11-18 DIAGNOSIS — E1122 Type 2 diabetes mellitus with diabetic chronic kidney disease: Secondary | ICD-10-CM | POA: Diagnosis not present

## 2022-11-18 DIAGNOSIS — N1832 Chronic kidney disease, stage 3b: Secondary | ICD-10-CM | POA: Diagnosis not present

## 2022-11-20 LAB — LIPID PANEL
Chol/HDL Ratio: 3.6 ratio (ref 0.0–5.0)
Cholesterol, Total: 216 mg/dL — ABNORMAL HIGH (ref 100–199)
HDL: 60 mg/dL (ref >39)
LDL Chol Calc (NIH): 134 mg/dL — ABNORMAL HIGH (ref 0–99)
Triglycerides: 125 mg/dL (ref 0–149)
VLDL Cholesterol Cal: 22 mg/dL (ref 5–40)

## 2022-11-20 LAB — HEMOGLOBIN A1C
Est. average glucose Bld gHb Est-mCnc: 85 mg/dL
Hgb A1c MFr Bld: 4.6 % — ABNORMAL LOW (ref 4.8–5.6)

## 2022-11-20 LAB — SPECIMEN STATUS REPORT

## 2022-11-20 LAB — MICROALBUMIN / CREATININE URINE RATIO

## 2022-11-21 ENCOUNTER — Other Ambulatory Visit: Payer: Self-pay | Admitting: Physician Assistant

## 2022-11-21 ENCOUNTER — Encounter: Payer: Self-pay | Admitting: Family Medicine

## 2022-11-21 ENCOUNTER — Telehealth: Payer: Self-pay

## 2022-11-21 DIAGNOSIS — D649 Anemia, unspecified: Secondary | ICD-10-CM

## 2022-11-21 DIAGNOSIS — E610 Copper deficiency: Secondary | ICD-10-CM

## 2022-11-21 DIAGNOSIS — D696 Thrombocytopenia, unspecified: Secondary | ICD-10-CM

## 2022-11-21 MED ORDER — VENLAFAXINE HCL ER 150 MG PO CP24: 150.0000 mg | ORAL_CAPSULE | Freq: Two times a day (BID) | ORAL | 0 refills | Status: DC

## 2022-11-21 NOTE — Telephone Encounter (Signed)
Prescription Request  11/21/2022  LOV: Visit date not found  What is the name of the medication or equipment? venlafaxine XR (EFFEXOR-XR) 150 MG 24 hr capsule   Have you contacted your pharmacy to request a refill? Yes   Which pharmacy would you like this sent to? [facility]   Patient notified that their request is being sent to the clinical staff for review and that they should receive a response within 2 business days.   Please advise at Mobile [phone] (mobile)

## 2022-11-21 NOTE — Progress Notes (Signed)
Per triage nurse, patient called back last week to say that he would like to defer bone marrow biopsy at this time.  After speaking with his liver specialist at [facility], they did not feel bone marrow biopsy was necessary, therefore patient no longer wants to pursue this.  We will schedule patient for follow-up visit in 2 months with labs the week before to include CBC/D, LDH, reticulocytes, copper, zinc, ferritin, iron/TIBC, immature platelet fraction, CMP.  Carnella Guadalajara, PA-C 11/21/22 7:39 PM

## 2022-11-21 NOTE — Telephone Encounter (Signed)
Received via fax Rx request: Prescription sent electronically to pharmacy  

## 2022-11-23 DIAGNOSIS — E1122 Type 2 diabetes mellitus with diabetic chronic kidney disease: Secondary | ICD-10-CM | POA: Diagnosis not present

## 2022-11-23 DIAGNOSIS — N1831 Chronic kidney disease, stage 3a: Secondary | ICD-10-CM | POA: Diagnosis not present

## 2022-11-23 DIAGNOSIS — I129 Hypertensive chronic kidney disease with stage 1 through stage 4 chronic kidney disease, or unspecified chronic kidney disease: Secondary | ICD-10-CM | POA: Diagnosis not present

## 2022-11-23 DIAGNOSIS — I5032 Chronic diastolic (congestive) heart failure: Secondary | ICD-10-CM | POA: Diagnosis not present

## 2022-12-05 ENCOUNTER — Other Ambulatory Visit: Payer: Self-pay | Admitting: Family Medicine

## 2022-12-15 ENCOUNTER — Encounter: Payer: Self-pay | Admitting: *Deleted

## 2022-12-27 ENCOUNTER — Ambulatory Visit (HOSPITAL_COMMUNITY): Admission: RE | Admit: 2022-12-27 | Payer: Medicare Other | Source: Ambulatory Visit

## 2022-12-28 ENCOUNTER — Ambulatory Visit (HOSPITAL_COMMUNITY)
Admission: RE | Admit: 2022-12-28 | Discharge: 2022-12-28 | Disposition: A | Discharge status: Home / Self Care | Payer: Medicare Other | Source: Ambulatory Visit | Attending: Gastroenterology | Admitting: Gastroenterology

## 2022-12-28 ENCOUNTER — Other Ambulatory Visit: Payer: Self-pay | Admitting: Gastroenterology

## 2022-12-28 ENCOUNTER — Encounter (HOSPITAL_COMMUNITY): Payer: Self-pay

## 2022-12-28 DIAGNOSIS — R188 Other ascites: Secondary | ICD-10-CM | POA: Insufficient documentation

## 2022-12-29 ENCOUNTER — Other Ambulatory Visit: Payer: Self-pay | Admitting: Family Medicine

## 2022-12-29 ENCOUNTER — Telehealth: Payer: Self-pay | Admitting: Gastroenterology

## 2022-12-29 NOTE — Telephone Encounter (Signed)
He is going to need an OV as I haven't seen him since April.

## 2022-12-29 NOTE — Telephone Encounter (Signed)
Patient's wife left a message that she needed to schedule him for a colonoscopy/endoscopy.  [phone]

## 2022-12-30 DIAGNOSIS — Z01818 Encounter for other preprocedural examination: Secondary | ICD-10-CM | POA: Diagnosis not present

## 2022-12-30 DIAGNOSIS — R9430 Abnormal result of cardiovascular function study, unspecified: Secondary | ICD-10-CM | POA: Diagnosis not present

## 2022-12-30 NOTE — Telephone Encounter (Signed)
Noted  

## 2023-01-16 ENCOUNTER — Inpatient Hospital Stay: Payer: Medicare Other | Attending: Hematology

## 2023-01-19 NOTE — Progress Notes (Signed)
Referring Provider: Babs Sciara, MD Primary Care Physician:  Babs Sciara, MD Primary GI Physician: Dr. Jena Gauss  Chief Complaint  Patient presents with   Follow-up    Follow up. No problems     HPI:   Shawn [name]. is a 67 y.o. male  presenting today for follow-up to discuss scheduling EGD and colonoscopy.   He has a history of GERD, cirrhosis felt to be related to NASH +/- alcohol with history of intermittent heavy alcohol drinking in the past, abstinent since December 2013. He also has a history of elevated AFP, no HCC on MRI.   In 2021 he had decompensation with ascites and lower extremity edema. First paracentesis at that time with no evidence of SBP, cytology negative. Started requiring recurrent paras in August 2023. TIPS placement 02/16/22, but continued with frequent paras as he developed renal insufficiency and has been intolerant to diuretics.  He underwent TIPS balloon expansion 05/09/2022.   Admitted to the hospital in March 2024 with concern for SBP in the setting of positive Gram stain, culture with Streptococcus salivarius, but PMNs less than 250. It was felt that this was a contaminant, thus no recommendations for SBP prophylaxis.   Also with hepatic encephalopathy, on Lactulose and Xifaxan.   He has established with with Annamarie Major at [facility] liver [facility] in [place] and is currently undergoing transplant evaluation.    Today:   Cirrhosis: MELD 3.0: 17 on 05/26/22. 18 based on labs in August/September.  [place]: 05/17/22- no focal liver lesion.  AFP: Normal April 2024.  Hep A/B vaccination: Immune    EGD: Never. This has been on hold in the setting of significant ascites undergoing TIPS/balloon expansion. Had recommended proceeding after last visit in April. Patient's wife was going to schedule after OV, but we were never able to get back in touch.   Ascites/peripheral edema:  Last paracentesis 10/05/22.   Thought he needed para last week as he has had some  abdominal bloating, but they told him he didn't have enough fluid. Reports he continues to feel somewhat bloated, like gassy.  He does eat a lot of broccoli and asparagus.  He has also been eating better/more in general and has gained some weight.  Diuretics:  Following with nephrology to help with diuretic management due to renal dysfunction.  Currently taking spironolactone 25 mg daily and torsemide 10 mg daily. Following a low sodium diet.   Encephalopathy:   None.  Taking lactulose 30 mL 3 times daily and Xifaxan 550 mg BID.  Having 4-5 bowel movements daily.  No BRBPR or melena.    GERD:  Doing well on pantoprazole 40 mg daily. No dysphagia, nausea, or vomiting.    Anemia:  History of anemia, new onset February 2024 with Hgb 10.8. No evidence of iron deficiency, B12 deficiency, folate deficiency. Hemoccult negative x 3 in March 2024. He established with hematology for normocytic anemia and thrombocytopenia on 06/07/2022. He was found to have low copper and has been started on supplementation. He has also been advised to take ferrous sulfate 325 mg daily per hematology. Hematology has recommended bone marrow biopsy, EGD and colonoscopy and have considered Retacrit should Hgb drop below 10.  We had recommended EGD and colonoscopy after his last visit in April, but procedures were never scheduled as we are unable to reach him after his office visit.   Most recent hemoglobin stable at 10.5 on 09/16/2022.  Has follow-up with hematology 02/01/2023.  Past Medical  History:  Diagnosis Date   ADD (attention deficit disorder)    ADHD    Anxiety 1987   mild (crowds, noises)   Anxiety    Attention deficit disorder    Cancer (HCC)    Cirrhosis of liver (HCC)    CVA (cerebral vascular accident) (HCC)    Depression    Diabetic neuropathy (HCC)    Essential hypertension, benign    Long-standing history, negative secondary workup   Headache    History of cardiac catheterization    Widely patent  coronary and renal arteries November 2013   Hyperlipidemia    Hypertension    whitecoat   Obstructive sleep apnea    cpap   Prediabetes    Sleep apnea    TIA (transient ischemic attack)    Possible, 2007   Type 2 diabetes mellitus (HCC)    Venous stasis     Past Surgical History:  Procedure Laterality Date   ANTERIOR CERVICAL DECOMP/DISCECTOMY FUSION N/A 12/30/2015   Procedure: ANTERIOR CERVICAL DECOMPRESSION/DISCECTOMY FUSION C6 - C7 1 LEVEL;  Surgeon: Venita Lick, MD;  Location: [facility] OR;  Service: Orthopedics;  Laterality: N/A;   CARDIAC CATHETERIZATION  01/2012   negative   CARPAL TUNNEL RELEASE     CERVICAL SPINE NERVE BLOCK  05/17/2017   IR INTRAVASCULAR ULTRASOUND NON CORONARY  02/14/2022   IR PARACENTESIS  02/14/2022   IR PARACENTESIS  05/09/2022   IR RADIOLOGIST EVAL & MGMT  12/03/2021   IR RADIOLOGIST EVAL & MGMT  01/13/2022   IR RADIOLOGIST EVAL & MGMT  03/24/2022   IR RADIOLOGIST EVAL & MGMT  04/20/2022   IR RADIOLOGIST EVAL & MGMT  06/08/2022   IR RADIOLOGIST EVAL & MGMT  08/09/2022   IR TIPS  02/14/2022   IR TIPS REVISION MOD SED  05/09/2022   IR US GUIDE VASC ACCESS RIGHT  02/14/2022   IR US GUIDE VASC ACCESS RIGHT  02/14/2022   IR US GUIDE VASC ACCESS RIGHT  05/09/2022   KNEE SURGERY Right    NECK SURGERY     RADIOLOGY WITH ANESTHESIA N/A 02/14/2022   Procedure: TIPS;  Surgeon: Bennie Dallas, MD;  Location: [facility] OR;  Service: Radiology;  Laterality: N/A;   VASECTOMY      Current Outpatient Medications  Medication Sig Dispense Refill   ascorbic Acid (VITAMIN C) 500 MG CPCR Take 500 mg by mouth daily.     aspirin EC 81 MG tablet Take 81 mg by mouth daily. Swallow whole.     Blood Glucose Monitoring Suppl DEVI 1 each by Does not apply route in the morning, at noon, and at bedtime. May substitute to any manufacturer covered by patient's insurance Dx E11.22 1 each 0   Cholecalciferol (D3 VITAMIN PO) Take by mouth daily. 5000 IU daily     Copper Gluconate 2 MG TABS TAKE 2  AND 1/2 TABLETS (5 MG TOTAL) BY MOUTH 2 (TWO) TIMES DAILY FOR 7 DAYS, THEN 2 AND 1/2 TABLETS (5 MG TOTAL) DAILY. 70 tablet 0   dorzolamide (TRUSOPT) 2 % ophthalmic solution 1 drop 2 (two) times daily.     fluticasone (FLONASE) 50 MCG/ACT nasal spray SPRAY 2 SPRAYS INTO EACH NOSTRIL EVERY DAY 48 mL 0   Glucose Blood (BLOOD GLUCOSE TEST STRIPS) STRP Use to check blood sugar once daily before meals. May substitute to any manufacturer covered by patient's insurance E11.22 100 strip 3   lactulose (CHRONULAC) 10 GM/15ML solution Take 30 mLs (20 g total) by mouth 3 (  three) times daily. 2700 mL 5   Lancet Device MISC Use to check blood sugar once daily before meals. May substitute to any manufacturer covered by patient's insurance E11.22 100 each 3   Lancets Misc. MISC Use to check blood sugar once daily before meals. May substitute to any manufacturer covered by patient's insurance 100 each 1   metFORMIN (GLUCOPHAGE) 500 MG tablet TAKE 1 TABLET BY MOUTH EVERY DAY 90 tablet 0   Omega-3 Fatty Acids (FISH OIL) 1000 MG CAPS Take by mouth daily.     OVER THE COUNTER MEDICATION Copper supplement     pantoprazole (PROTONIX) 40 MG tablet TAKE 1 TABLET BY MOUTH EVERY DAY 90 tablet 1   rifaximin (XIFAXAN) 550 MG TABS tablet Take 1 tablet (550 mg total) by mouth 2 (two) times daily. 60 tablet 11   sodium bicarbonate 650 MG tablet Take 650 mg by mouth 3 (three) times daily. Morning, evening, bedtime     spironolactone (ALDACTONE) 25 MG tablet Take 25 mg by mouth daily.     torsemide (DEMADEX) 10 MG tablet Take 10 mg by mouth daily. For two weeks     venlafaxine XR (EFFEXOR-XR) 150 MG 24 hr capsule TAKE 1 CAPSULE BY MOUTH TWICE A DAY 180 capsule 0   zinc gluconate 50 MG tablet Take 50 mg by mouth daily.     No current facility-administered medications for this visit.    Allergies as of 01/20/2023   (No Known Allergies)    Family History  Problem Relation Age of Onset   Colon cancer Mother    Hypertension  Mother    CAD Mother    Breast cancer Mother    Cirrhosis Brother     Social History   Socioeconomic History   Marital status: Married    Spouse name: Not on file   Number of children: 4   Years of education: Not on file   Highest education level: 9th grade  Occupational History   Occupation: Systems developer   Tobacco Use   Smoking status: Never   Smokeless tobacco: Not on file   Tobacco comments:    Uses dip pouches occasionally  Vaping Use   Vaping status: Never Used  Substance and Sexual Activity   Alcohol use: No    Alcohol/week: 0.0 standard drinks of alcohol    Comment: Previously 1-2 cases of beer/week. patient states never on regular basis though. last etoh in 2013.    Drug use: No   Sexual activity: Not Currently  Other Topics Concern   Not on file  Social History Narrative   ** Merged History Encounter **       Social Determinants of Health   Financial Resource Strain: Patient Declined (07/09/2022)   Overall Financial Resource Strain (CARDIA)    Difficulty of Paying Living Expenses: Patient declined  Food Insecurity: Patient Declined (07/09/2022)   Hunger Vital Sign    Worried About Running Out of Food in the Last Year: Patient declined    Ran Out of Food in the Last Year: Patient declined  Transportation Needs: Unmet Transportation Needs (07/09/2022)   PRAPARE - Administrator, Civil Service (Medical): Yes    Lack of Transportation (Non-Medical): No  Physical Activity: Unknown (07/09/2022)   Exercise Vital Sign    Days of Exercise per Week: 1 day    Minutes of Exercise per Session: Patient declined  Stress: Stress Concern Present (07/09/2022)   [employer] of Occupational Health - Occupational Stress Questionnaire  Feeling of Stress : Very much  Social Connections: Unknown (07/09/2022)   Social Connection and Isolation Panel [NHANES]    Frequency of Communication with Friends and Family: More than three times a week    Frequency of Social  Gatherings with Friends and Family: Once a week    Attends Religious Services: Patient declined    Database administrator or Organizations: No    Attends Engineer, structural: Not on file    Marital Status: Married    Review of Systems: Gen: Denies fever, chills, cold or flulike symptoms, presyncope, syncope. CV: Denies chest pain, palpitations. Resp: Admits to shortness of breath with exertion.  No cough. GI: See HPI Heme: See HPI  Physical Exam: BP 135/70 (BP Location: Left Arm, Patient Position: Sitting, Cuff Size: Large)   Pulse 86   Temp 97.9 F (36.6 C) (Temporal)   Ht 5\' 5"  (1.651 m)   Wt 224 lb 6.4 oz (101.8 kg)   BMI 37.34 kg/m  General:   Alert and oriented. No distress noted. Pleasant and cooperative.  Head:  Normocephalic and atraumatic. Eyes:  Conjuctiva clear without scleral icterus. Heart:  S1, S2 present without murmurs appreciated. Lungs:  Clear to auscultation bilaterally. No wheezes, rales, or rhonchi. No distress.  Abdomen:  +BS, full but soft, non-tender and non-distended. No rebound or guarding. No HSM or masses noted. Msk:  Symmetrical without gross deformities. Normal posture. Extremities: Pitting edema up to his thighs. Neurologic:  Alert and  oriented x4 Psych:  Normal mood and affect.    Assessment:  67 year old male with history of GERD, decompensated cirrhosis felt to be related to NASH +/- alcohol use previously , CVA, diabetes, HTN, HLD, sleep apnea, CKD, presenting today for routine follow-up and to discuss scheduling EGD and colonoscopy.  Cirrhosis: Secondary to NASH +/- alcohol.  Abstinent since December 2013. MELD 3.0, 18 in August/September 2024. Immune to Hep A/B. No prior EGD. Due for routine HCC screening. History of intermittently mildly elevated AFP, but no HCC on MRI in 2022. Last AFP was normal in April 2024 and is currently undergoing routine surveillance with [place].    Cirrhosis has been complicated by recurrent  ascites/peripheral edema, hepatic encephalopathy, thrombocytopenia/coagulopathy following with hematology, renal insufficiency following with nephrology.  He has established with [facility] and is undergoing transplant evaluation.  Ascites/peripheral edema: S/p TIPS in December 2023 but continued requiring frequent paras, up to twice weekly, as developed renal insufficiency, intolerant to diuretics. He underwent TIPS balloon expansion 05/09/2022 and frequency of paracentesis has decreased significantly.  Last paracentesis was in July. He is maintaining on spironolactone 25 mg and torsemide 10 mg daily per nephrology.  His abdomen is full, but not tense on exam today.  He continues to have significant peripheral edema with pitting into the his thighs.  Hepatic encephalopathy: Doing well on lactulose and Xifaxan.  Esophageal variceal screening: No prior EGD.  Will get this scheduled in the near future.  Bloating/gas: Likely related to consumption of cruciferous vegetables and recent weight gain.  Patient thought he was retaining peritoneal fluid, and presented to [facility] for paracentesis on 10/23, but he did not have any significant fluid.  Denies any worsening abdominal distention since that time.  His abdomen is full, but not tense.  We discussed using Gas-X or Beano, daily probiotic, and avoiding common gas producing items.  Advised to monitor for any worsening distention and return to [facility] for paracentesis if this were to occur.  Colon cancer screening: Needs first-ever screening colonoscopy.  Mother with history of colon cancer. He has no significant lower GI symptoms or alarm symptoms.  He was found to have new onset anemia in February 2024 (following with hematology), but no evidence of iron deficiency (though hematology has recommended iron daily), no overt GI bleeding, and Hemoccult negative x 3 in March.   GERD: Well-controlled on pantoprazole 40 mg daily.    Plan:   Proceed with upper endoscopy and colonoscopy with propofol by Dr. Jena Gauss in near future. The risks, benefits, and alternatives have been discussed with the patient in detail. The patient states understanding and desires to proceed.  ASA 3/4 No iron x 7 days prior.  No AM DM meds morning of. CBC, CMP, INR to update MELD prior to procedures.   AFP.  RUQ [place] Continue paracentesis prn. He has standing orders.  Continue lactulose 30 mL 3 times daily. Continue Xifaxan 550 mg twice daily. Continue spironolactone 25 mg and torsemide 10 mg daily per nephrology. Continue pantoprazole 40 mg daily. Use Gas-X as needed for gas/bloating. May try Beano before meals. May also try daily probiotic. Discussed avoiding common gas producing items including broccoli, cabbage, Brussels sprouts, beans, artificial sweeteners, carbonated beverages, drinking through a straw, hard candy, chewing gum. Nutrition Recommendations:  High-protein diet from a primarily plant-based diet.You should have any where from 100-150g of protein per day.  Avoid red meat.  No raw or undercooked meat, seafood, or shellfish. Low-fat/cholesterol/carbohydrate diet. Limit sodium to no more than 2000 mg/day including everything that you eat and drink. Recommend at least 30 minutes of aerobic and resistance exercise 3 days/week. Continue following closely with hematology and nephrology. Continue following with [facility] for transplant evaluation.  Follow-up in our office after procedures. If nothing significant, would be ok to follow-up in 6 months from now.    Ermalinda Memos, PA-C [facility] 01/20/2023

## 2023-01-20 ENCOUNTER — Encounter: Payer: Self-pay | Admitting: Gastroenterology

## 2023-01-20 ENCOUNTER — Ambulatory Visit (INDEPENDENT_AMBULATORY_CARE_PROVIDER_SITE_OTHER): Payer: Medicare Other | Admitting: Gastroenterology

## 2023-01-20 VITALS — BP 135/70 | HR 86 | Temp 97.9°F | Ht 65.0 in | Wt 224.4 lb

## 2023-01-20 DIAGNOSIS — F1091 Alcohol use, unspecified, in remission: Secondary | ICD-10-CM

## 2023-01-20 DIAGNOSIS — R140 Abdominal distension (gaseous): Secondary | ICD-10-CM | POA: Diagnosis not present

## 2023-01-20 DIAGNOSIS — K219 Gastro-esophageal reflux disease without esophagitis: Secondary | ICD-10-CM | POA: Diagnosis not present

## 2023-01-20 DIAGNOSIS — Z1211 Encounter for screening for malignant neoplasm of colon: Secondary | ICD-10-CM

## 2023-01-20 DIAGNOSIS — R609 Edema, unspecified: Secondary | ICD-10-CM

## 2023-01-20 DIAGNOSIS — K7031 Alcoholic cirrhosis of liver with ascites: Secondary | ICD-10-CM

## 2023-01-20 DIAGNOSIS — K7682 Hepatic encephalopathy: Secondary | ICD-10-CM

## 2023-01-20 DIAGNOSIS — Z800 Family history of malignant neoplasm of digestive organs: Secondary | ICD-10-CM

## 2023-01-20 DIAGNOSIS — D649 Anemia, unspecified: Secondary | ICD-10-CM | POA: Diagnosis not present

## 2023-01-20 DIAGNOSIS — K7460 Unspecified cirrhosis of liver: Secondary | ICD-10-CM

## 2023-01-20 NOTE — Patient Instructions (Addendum)
We will get you scheduled for an upper endoscopy and colonoscopy in the near future with Dr. Jena Gauss.  We will also get you scheduled to have an ultrasound of your liver at [facility].  Please have blood work completed at [facility] when you go to have your ultrasound or for preop testing.  Continue all of your current medications.  Nutrition Recommendations:  High-protein diet from a primarily plant-based diet.You should have any where from 100-150g of protein per day.  Avoid red meat.  No raw or undercooked meat, seafood, or shellfish. Low-fat/cholesterol/carbohydrate diet. Limit sodium to no more than 2000 mg/day including everything that you eat and drink. Recommend at least 30 minutes of aerobic and resistance exercise 3 days/week.   For bloating:  You can try using Gas-X as needed or Beano before meals. You can also try adding a daily probiotic such as digestive advantage, Vear Clock' colon health, align. If the above are not helpful, I would recommend that you avoid common gas producing items including broccoli, cabbage, Brussels sprouts, beans, artificial sweeteners, carbonated beverages, drinking through a straw, hard candy, chewing gum.   I will plan to see you back after your procedures.  Have a great Thanksgiving and Christmas!   Ermalinda Memos, PA-C [facility]

## 2023-01-24 ENCOUNTER — Other Ambulatory Visit: Payer: Self-pay | Admitting: Family Medicine

## 2023-01-24 NOTE — Telephone Encounter (Signed)
Please talk with patient's wife, she oversees his medicines, let her know that our electronic medical record states that this medication was discontinued by another provider, we need verification is he currently taking this or not taking this

## 2023-01-26 ENCOUNTER — Other Ambulatory Visit: Payer: Self-pay | Admitting: Gastroenterology

## 2023-01-26 ENCOUNTER — Ambulatory Visit (HOSPITAL_COMMUNITY)
Admission: RE | Admit: 2023-01-26 | Discharge: 2023-01-26 | Disposition: A | Discharge status: Home / Self Care | Payer: Medicare Other | Source: Ambulatory Visit | Attending: Gastroenterology | Admitting: Gastroenterology

## 2023-01-26 DIAGNOSIS — I5032 Chronic diastolic (congestive) heart failure: Secondary | ICD-10-CM | POA: Diagnosis not present

## 2023-01-26 DIAGNOSIS — R188 Other ascites: Secondary | ICD-10-CM | POA: Insufficient documentation

## 2023-01-26 DIAGNOSIS — N189 Chronic kidney disease, unspecified: Secondary | ICD-10-CM | POA: Diagnosis not present

## 2023-01-26 DIAGNOSIS — D638 Anemia in other chronic diseases classified elsewhere: Secondary | ICD-10-CM | POA: Diagnosis not present

## 2023-01-26 DIAGNOSIS — D649 Anemia, unspecified: Secondary | ICD-10-CM | POA: Diagnosis not present

## 2023-01-26 DIAGNOSIS — K7469 Other cirrhosis of liver: Secondary | ICD-10-CM | POA: Diagnosis not present

## 2023-01-26 DIAGNOSIS — K7460 Unspecified cirrhosis of liver: Secondary | ICD-10-CM | POA: Diagnosis not present

## 2023-01-26 DIAGNOSIS — N1831 Chronic kidney disease, stage 3a: Secondary | ICD-10-CM | POA: Diagnosis not present

## 2023-01-26 DIAGNOSIS — I129 Hypertensive chronic kidney disease with stage 1 through stage 4 chronic kidney disease, or unspecified chronic kidney disease: Secondary | ICD-10-CM | POA: Diagnosis not present

## 2023-01-26 DIAGNOSIS — E1122 Type 2 diabetes mellitus with diabetic chronic kidney disease: Secondary | ICD-10-CM | POA: Diagnosis not present

## 2023-01-26 DIAGNOSIS — D696 Thrombocytopenia, unspecified: Secondary | ICD-10-CM | POA: Diagnosis not present

## 2023-01-31 LAB — COMPREHENSIVE METABOLIC PANEL
ALT: 37 [IU]/L (ref 0–44)
AST: 68 IU/L — ABNORMAL HIGH (ref 0–40)
Albumin: 2.2 g/dL — ABNORMAL LOW (ref 3.9–4.9)
Alkaline Phosphatase: 144 IU/L — ABNORMAL HIGH (ref 44–121)
BUN/Creatinine Ratio: 11 (ref 10–24)
BUN: 15 mg/dL (ref 8–27)
Bilirubin Total: 2.5 mg/dL — ABNORMAL HIGH (ref 0.0–1.2)
CO2: 24 mmol/L (ref 20–29)
Calcium: 8.5 mg/dL — ABNORMAL LOW (ref 8.6–10.2)
Chloride: 109 mmol/L — ABNORMAL HIGH (ref 96–106)
Creatinine, Ser: 1.37 mg/dL — ABNORMAL HIGH (ref 0.76–1.27)
Globulin, Total: 3 g/dL (ref 1.5–4.5)
Glucose: 100 mg/dL — ABNORMAL HIGH (ref 70–99)
Potassium: 3.9 mmol/L (ref 3.5–5.2)
Sodium: 145 mmol/L — ABNORMAL HIGH (ref 134–144)
Total Protein: 5.2 g/dL — ABNORMAL LOW (ref 6.0–8.5)
eGFR: 57 mL/min/{1.73_m2} — ABNORMAL LOW (ref >59)

## 2023-01-31 LAB — CBC/DIFF AMBIGUOUS DEFAULT
Basophils Absolute: 0.1 10*3/uL (ref 0.0–0.2)
Basos: 2 %
EOS (ABSOLUTE): 0.4 10*3/uL (ref 0.0–0.4)
Eos: 9 %
Hematocrit: 33.9 % — ABNORMAL LOW (ref 37.5–51.0)
Hemoglobin: 11.3 g/dL — ABNORMAL LOW (ref 13.0–17.7)
Immature Grans (Abs): 0 10*3/uL (ref 0.0–0.1)
Immature Granulocytes: 1 %
Lymphocytes Absolute: 0.5 10*3/uL — ABNORMAL LOW (ref 0.7–3.1)
Lymphs: 12 %
MCH: 33.7 pg — ABNORMAL HIGH (ref 26.6–33.0)
MCHC: 33.3 g/dL (ref 31.5–35.7)
MCV: 101 fL — ABNORMAL HIGH (ref 79–97)
Monocytes Absolute: 0.6 10*3/uL (ref 0.1–0.9)
Monocytes: 15 %
Neutrophils Absolute: 2.7 10*3/uL (ref 1.4–7.0)
Neutrophils: 61 %
Platelets: 111 10*3/uL — ABNORMAL LOW (ref 150–450)
RBC: 3.35 x10E6/uL — ABNORMAL LOW (ref 4.14–5.80)
RDW: 14.5 % (ref 11.6–15.4)
WBC: 4.4 10*3/uL (ref 3.4–10.8)

## 2023-01-31 LAB — PROTIME-INR
INR: 1.3 — ABNORMAL HIGH (ref 0.9–1.2)
Prothrombin Time: 13.9 s — ABNORMAL HIGH (ref 9.1–12.0)

## 2023-01-31 LAB — AFP TUMOR MARKER: AFP, Serum, Tumor Marker: 3.4 ng/mL (ref 0.0–8.4)

## 2023-01-31 LAB — SPECIMEN STATUS REPORT

## 2023-02-01 ENCOUNTER — Inpatient Hospital Stay: Payer: Medicare Other | Admitting: Physician Assistant

## 2023-02-07 ENCOUNTER — Encounter: Payer: Self-pay | Admitting: *Deleted

## 2023-02-07 ENCOUNTER — Other Ambulatory Visit: Payer: Self-pay | Admitting: *Deleted

## 2023-02-07 MED ORDER — PEG 3350-KCL-NA BICARB-NACL 420 G PO SOLR: 4000.0000 mL | Freq: Once | ORAL | 0 refills | Status: AC

## 2023-02-09 ENCOUNTER — Other Ambulatory Visit: Payer: Self-pay | Admitting: Family Medicine

## 2023-02-10 ENCOUNTER — Ambulatory Visit (INDEPENDENT_AMBULATORY_CARE_PROVIDER_SITE_OTHER): Payer: Medicare Other

## 2023-02-10 VITALS — Ht 65.0 in | Wt 224.0 lb

## 2023-02-10 DIAGNOSIS — Z0000 Encounter for general adult medical examination without abnormal findings: Secondary | ICD-10-CM

## 2023-02-10 NOTE — Patient Instructions (Signed)
Shawn [name] , Thank you for taking time to come for your Medicare Wellness Visit. I appreciate your ongoing commitment to your health goals. Please review the following plan we discussed and let me know if I can assist you in the future.   Referrals/Orders/Follow-Ups/Clinician Recommendations:  Next Medicare Annual Wellness Visit: February 16, 2024 at 8:00 am virtual visit       This is a list of the screening recommended for you and due dates:  Health Maintenance  Topic Date Due   Zoster (Shingles) Vaccine (1 of 2) Never done   Colon Cancer Screening  Never done   COVID-19 Vaccine (2 - Pfizer risk series) 03/30/2020   Complete foot exam   04/15/2023   Hemoglobin A1C  05/18/2023   Eye exam for diabetics  10/10/2023   Yearly kidney health urinalysis for diabetes  11/18/2023   Yearly kidney function blood test for diabetes  01/26/2024   Medicare Annual Wellness Visit  02/10/2024   DTaP/Tdap/Td vaccine (2 - Td or Tdap) 04/14/2032   Pneumonia Vaccine  Completed   Flu Shot  Completed   Hepatitis C Screening  Completed   HIV Screening  Completed   HPV Vaccine  Aged Out    Advanced directives: (ACP Link)Information on Advanced Care Planning can be found at [facility] [place] Advance Health Care Directives Advance Health Care Directives ([email or web address]   Next Medicare Annual Wellness Visit scheduled for next year: Yes  Preventive Care 65 Years and Older, Male Preventive care refers to lifestyle choices and visits with your health care provider that can promote health and wellness. Preventive care visits are also called wellness exams. What can I expect for my preventive care visit? Counseling During your preventive care visit, your health care provider may ask about your: Medical history, including: Past medical problems. Family medical history. History of falls. Current health, including: Emotional well-being. Home life and relationship well-being. Sexual  activity. Memory and ability to understand (cognition). Lifestyle, including: Alcohol, nicotine or tobacco, and drug use. Access to firearms. Diet, exercise, and sleep habits. Work and work Astronomer. Sunscreen use. Safety issues such as seatbelt and bike helmet use. Physical exam Your health care provider will check your: Height and weight. These may be used to calculate your BMI (body mass index). BMI is a measurement that tells if you are at a healthy weight. Waist circumference. This measures the distance around your waistline. This measurement also tells if you are at a healthy weight and may help predict your risk of certain diseases, such as type 2 diabetes and high blood pressure. Heart rate and blood pressure. Body temperature. Skin for abnormal spots. What immunizations do I need?  Vaccines are usually given at various ages, according to a schedule. Your health care provider will recommend vaccines for you based on your age, medical history, and lifestyle or other factors, such as travel or where you work. What tests do I need? Screening Your health care provider may recommend screening tests for certain conditions. This may include: Lipid and cholesterol levels. Diabetes screening. This is done by checking your blood sugar (glucose) after you have not eaten for a while (fasting). Hepatitis C test. Hepatitis B test. HIV (human immunodeficiency virus) test. STI (sexually transmitted infection) testing, if you are at risk. Lung cancer screening. Colorectal cancer screening. Prostate cancer screening. Abdominal aortic aneurysm (AAA) screening. You may need this if you are a current or former smoker. Talk with your health care provider about your test results,  treatment options, and if necessary, the need for more tests. Follow these instructions at home: Eating and drinking  Eat a diet that includes fresh fruits and vegetables, whole grains, lean protein, and low-fat  dairy products. Limit your intake of foods with high amounts of sugar, saturated fats, and salt. Take vitamin and mineral supplements as recommended by your health care provider. Do not drink alcohol if your health care provider tells you not to drink. If you drink alcohol: Limit how much you have to 0-2 drinks a day. Know how much alcohol is in your drink. In the [place] one drink equals one 12 oz bottle of beer (355 mL), one 5 oz glass of wine (148 mL), or one 1 oz glass of hard liquor (44 mL). Lifestyle Brush your teeth every morning and night with fluoride toothpaste. Floss one time each day. Exercise for at least 30 minutes 5 or more days each week. Do not use any products that contain nicotine or tobacco. These products include cigarettes, chewing tobacco, and vaping devices, such as e-cigarettes. If you need help quitting, ask your health care provider. Do not use drugs. If you are sexually active, practice safe sex. Use a condom or other form of protection to prevent STIs. Take aspirin only as told by your health care provider. Make sure that you understand how much to take and what form to take. Work with your health care provider to find out whether it is safe and beneficial for you to take aspirin daily. Ask your health care provider if you need to take a cholesterol-lowering medicine (statin). Find healthy ways to manage stress, such as: Meditation, yoga, or listening to music. Journaling. Talking to a trusted person. Spending time with friends and family. Safety Always wear your seat belt while driving or riding in a vehicle. Do not drive: If you have been drinking alcohol. Do not ride with someone who has been drinking. When you are tired or distracted. While texting. If you have been using any mind-altering substances or drugs. Wear a helmet and other protective equipment during sports activities. If you have firearms in your house, make sure you follow all gun safety  procedures. Minimize exposure to UV radiation to reduce your risk of skin cancer. What's next? Visit your health care provider once a year for an annual wellness visit. Ask your health care provider how often you should have your eyes and teeth checked. Stay up to date on all vaccines. This information is not intended to replace advice given to you by your health care provider. Make sure you discuss any questions you have with your health care provider. Document Revised: 08/19/2020 Document Reviewed: 08/19/2020 Elsevier Patient Education  2024 [employer]. Understanding Your Risk for Falls Millions of people have serious injuries from falls each year. It is important to understand your risk of falling. Talk with your health care provider about your risk and what you can do to lower it. If you do have a serious fall, make sure to tell your provider. Falling once raises your risk of falling again. How can falls affect me? Serious injuries from falls are common. These include: Broken bones, such as hip fractures. Head injuries, such as traumatic brain injuries (TBI) or concussions. A fear of falling can cause you to avoid activities and stay at home. This can make your muscles weaker and raise your risk for a fall. What can increase my risk? There are a number of risk factors that increase your risk for  falling. The more risk factors you have, the higher your risk of falling. Serious injuries from a fall happen most often to people who are older than 65 years old. Teenagers and young adults ages 55-29 are also at higher risk. Common risk factors include: Weakness in the lower body. Being generally weak or confused due to long-term (chronic) illness. Dizziness or balance problems. Poor vision. Medicines that cause dizziness or drowsiness. These may include: Medicines for your blood pressure, heart, anxiety, insomnia, or swelling (edema). Pain medicines. Muscle relaxants. Other risk factors  include: Drinking alcohol. Having had a fall in the past. Having foot pain or wearing improper footwear. Working at a dangerous job. Having any of the following in your home: Tripping hazards, such as floor clutter or loose rugs. Poor lighting. Pets. Having dementia or memory loss. What actions can I take to lower my risk of falling?     Physical activity Stay physically fit. Do strength and balance exercises. Consider taking a regular class to build strength and balance. Yoga and tai chi are good options. Vision Have your eyes checked every year and your prescription for glasses or contacts updated as needed. Shoes and walking aids Wear non-skid shoes. Wear shoes that have rubber soles and low heels. Do not wear high heels. Do not walk around the house in socks or slippers. Use a cane or walker as told by your provider. Home safety Attach secure railings on both sides of your stairs. Install grab bars for your bathtub, shower, and toilet. Use a non-skid mat in your bathtub or shower. Attach bath mats securely with double-sided, non-slip rug tape. Use good lighting in all rooms. Keep a flashlight near your bed. Make sure there is a clear path from your bed to the bathroom. Use night-lights. Do not use throw rugs. Make sure all carpeting is taped or tacked down securely. Remove all clutter from walkways and stairways, including extension cords. Repair uneven or broken steps and floors. Avoid walking on icy or slippery surfaces. Walk on the grass instead of on icy or slick sidewalks. Use ice melter to get rid of ice on walkways in the winter. Use a cordless phone. Questions to ask your health care provider Can you help me check my risk for a fall? Do any of my medicines make me more likely to fall? Should I take a vitamin D supplement? What exercises can I do to improve my strength and balance? Should I make an appointment to have my vision checked? Do I need a bone density test  to check for weak bones (osteoporosis)? Would it help to use a cane or a walker? Where to find more information Centers for Disease Control and Prevention, [employer]: TonerPromos.no Community-Based Fall Prevention Programs: TonerPromos.no [employer] on Aging: BaseRingTones.pl Contact a health care provider if: You fall at home. You are afraid of falling at home. You feel weak, drowsy, or dizzy. This information is not intended to replace advice given to you by your health care provider. Make sure you discuss any questions you have with your health care provider. Document Revised: 10/25/2021 Document Reviewed: 10/25/2021 Elsevier Patient Education  2024 [employer].

## 2023-02-10 NOTE — Progress Notes (Signed)
 Because this visit was a virtual/telehealth visit,  certain criteria was not obtained, such a blood pressure, CBG if applicable, and timed get up and go. Any medications not marked as "taking" were not mentioned during the medication reconciliation part of the visit. Any vitals not documented were not able to be obtained due to this being a telehealth visit or patient was unable to self-report a recent blood pressure reading due to a lack of equipment at home via telehealth. Vitals that have been documented are verbally provided by the patient.   Subjective:   Shawn [name]. is a 67 y.o. male who presents for Medicare Annual/Subsequent preventive examination.  Visit Complete: Virtual I connected with  Joycelyn Das. on 02/10/23 by a audio enabled telemedicine application and verified that I am speaking with the correct person using two identifiers.  Patient Location: Home  Provider Location: Home Office  I discussed the limitations of evaluation and management by telemedicine. The patient expressed understanding and agreed to proceed.  Vital Signs: Because this visit was a virtual/telehealth visit, some criteria may be missing or patient reported. Any vitals not documented were not able to be obtained and vitals that have been documented are patient reported.  Patient Medicare AWV questionnaire was completed by the patient on na; I have confirmed that all information answered by patient is correct and no changes since this date.  Cardiac Risk Factors include: advanced age (>38men, >21 women);diabetes mellitus;dyslipidemia;hypertension;male gender;obesity (BMI >30kg/m2);sedentary lifestyle;Other (see comment), Risk factor comments: OSA, liver disease     Objective:    Today's Vitals   02/10/23 0804  Weight: 224 lb (101.6 kg)  Height: 5\' 5"  (1.651 m)   Body mass index is 37.28 kg/m.     02/10/2023    8:10 AM 10/03/2022    9:18 AM 06/07/2022    9:18 AM 05/24/2022    2:00 PM  05/24/2022   10:18 AM 05/18/2022    1:03 PM 05/09/2022   11:06 AM  Advanced Directives  Does Patient Have a Medical Advance Directive? No No No No No No No  Would patient like information on creating a medical advance directive? No - Patient declined No - Patient declined No - Patient declined No - Patient declined  No - Patient declined No - Patient declined    Current Medications (verified) Outpatient Encounter Medications as of 02/10/2023  Medication Sig   ascorbic Acid (VITAMIN C) 500 MG CPCR Take 500 mg by mouth daily.   aspirin EC 81 MG tablet Take 81 mg by mouth daily. Swallow whole.   Blood Glucose Monitoring Suppl DEVI 1 each by Does not apply route in the morning, at noon, and at bedtime. May substitute to any manufacturer covered by patient's insurance Dx E11.22   Cholecalciferol (D3 VITAMIN PO) Take by mouth daily. 5000 IU daily   Copper Gluconate 2 MG TABS TAKE 2 AND 1/2 TABLETS (5 MG TOTAL) BY MOUTH 2 (TWO) TIMES DAILY FOR 7 DAYS, THEN 2 AND 1/2 TABLETS (5 MG TOTAL) DAILY.   dorzolamide (TRUSOPT) 2 % ophthalmic solution 1 drop 2 (two) times daily.   fluticasone (FLONASE) 50 MCG/ACT nasal spray SPRAY 2 SPRAYS INTO EACH NOSTRIL EVERY DAY   Glucose Blood (BLOOD GLUCOSE TEST STRIPS) STRP Use to check blood sugar once daily before meals. May substitute to any manufacturer covered by patient's insurance E11.22   lactulose (CHRONULAC) 10 GM/15ML solution Take 30 mLs (20 g total) by mouth 3 (three) times daily.   Lancet  Device MISC Use to check blood sugar once daily before meals. May substitute to any manufacturer covered by patient's insurance E11.22   Lancets Misc. MISC Use to check blood sugar once daily before meals. May substitute to any manufacturer covered by patient's insurance   metFORMIN (GLUCOPHAGE) 500 MG tablet TAKE 1 TABLET BY MOUTH EVERY DAY   OVER THE COUNTER MEDICATION Take 2 tablets by mouth daily. Pure Encapsulation Copper   pantoprazole (PROTONIX) 40 MG tablet TAKE 1  TABLET BY MOUTH EVERY DAY   rifaximin (XIFAXAN) 550 MG TABS tablet Take 1 tablet (550 mg total) by mouth 2 (two) times daily.   spironolactone (ALDACTONE) 25 MG tablet Take 25 mg by mouth daily.   torsemide (DEMADEX) 10 MG tablet Take 10 mg by mouth daily. For two weeks   venlafaxine XR (EFFEXOR-XR) 150 MG 24 hr capsule TAKE 1 CAPSULE BY MOUTH TWICE A DAY   Omega-3 Fatty Acids (FISH OIL) 1000 MG CAPS Take by mouth daily. (Patient not taking: Reported on 02/10/2023)   sodium bicarbonate 650 MG tablet Take 650 mg by mouth 3 (three) times daily. Morning, evening, bedtime (Patient not taking: Reported on 02/10/2023)   zinc gluconate 50 MG tablet Take 50 mg by mouth daily. (Patient not taking: Reported on 02/10/2023)   No facility-administered encounter medications on file as of 02/10/2023.    Allergies (verified) Patient has no known allergies.   History: Past Medical History:  Diagnosis Date   ADD (attention deficit disorder)    ADHD    Anxiety 1987   mild (crowds, noises)   Anxiety    Attention deficit disorder    Cancer (HCC)    Cirrhosis of liver (HCC)    CVA (cerebral vascular accident) (HCC)    Depression    Diabetic neuropathy (HCC)    Essential hypertension, benign    Long-standing history, negative secondary workup   Headache    History of cardiac catheterization    Widely patent coronary and renal arteries November 2013   Hyperlipidemia    Hypertension    whitecoat   Obstructive sleep apnea    cpap   Prediabetes    Sleep apnea    TIA (transient ischemic attack)    Possible, 2007   Type 2 diabetes mellitus (HCC)    Venous stasis    Past Surgical History:  Procedure Laterality Date   ANTERIOR CERVICAL DECOMP/DISCECTOMY FUSION N/A 12/30/2015   Procedure: ANTERIOR CERVICAL DECOMPRESSION/DISCECTOMY FUSION C6 - C7 1 LEVEL;  Surgeon: Venita Lick, MD;  Location: [facility] OR;  Service: Orthopedics;  Laterality: N/A;   CARDIAC CATHETERIZATION  01/2012   negative   CARPAL  TUNNEL RELEASE     CERVICAL SPINE NERVE BLOCK  05/17/2017   IR INTRAVASCULAR ULTRASOUND NON CORONARY  02/14/2022   IR PARACENTESIS  02/14/2022   IR PARACENTESIS  05/09/2022   IR RADIOLOGIST EVAL & MGMT  12/03/2021   IR RADIOLOGIST EVAL & MGMT  01/13/2022   IR RADIOLOGIST EVAL & MGMT  03/24/2022   IR RADIOLOGIST EVAL & MGMT  04/20/2022   IR RADIOLOGIST EVAL & MGMT  06/08/2022   IR RADIOLOGIST EVAL & MGMT  08/09/2022   IR TIPS  02/14/2022   IR TIPS REVISION MOD SED  05/09/2022   IR US GUIDE VASC ACCESS RIGHT  02/14/2022   IR US GUIDE VASC ACCESS RIGHT  02/14/2022   IR US GUIDE VASC ACCESS RIGHT  05/09/2022   KNEE SURGERY Right    NECK SURGERY     RADIOLOGY  WITH ANESTHESIA N/A 02/14/2022   Procedure: TIPS;  Surgeon: Bennie Dallas, MD;  Location: [facility] OR;  Service: Radiology;  Laterality: N/A;   VASECTOMY     Family History  Problem Relation Age of Onset   Colon cancer Mother    Hypertension Mother    CAD Mother    Breast cancer Mother    Cirrhosis Brother    Social History   Socioeconomic History   Marital status: Married    Spouse name: Not on file   Number of children: 4   Years of education: Not on file   Highest education level: 9th grade  Occupational History   Occupation: Systems developer   Tobacco Use   Smoking status: Never   Smokeless tobacco: Not on file   Tobacco comments:    Uses dip pouches occasionally  Vaping Use   Vaping status: Never Used  Substance and Sexual Activity   Alcohol use: No    Alcohol/week: 0.0 standard drinks of alcohol    Comment: Previously 1-2 cases of beer/week. patient states never on regular basis though. last etoh in 2013.    Drug use: No   Sexual activity: Not Currently  Other Topics Concern   Not on file  Social History Narrative   ** Merged History Encounter **       Social Determinants of Health   Financial Resource Strain: Patient Declined (02/10/2023)   Overall Financial Resource Strain (CARDIA)    Difficulty of Paying Living  Expenses: Patient declined  Food Insecurity: Patient Declined (02/10/2023)   Hunger Vital Sign    Worried About Running Out of Food in the Last Year: Patient declined    Ran Out of Food in the Last Year: Patient declined  Transportation Needs: Unmet Transportation Needs (02/10/2023)   PRAPARE - Administrator, Civil Service (Medical): Yes    Lack of Transportation (Non-Medical): No  Physical Activity: Patient Declined (02/10/2023)   Exercise Vital Sign    Days of Exercise per Week: Patient declined    Minutes of Exercise per Session: Patient declined  Stress: Stress Concern Present (02/10/2023)   [employer] of Occupational Health - Occupational Stress Questionnaire    Feeling of Stress : Rather much  Social Connections: Moderately Isolated (02/10/2023)   Social Connection and Isolation Panel [NHANES]    Frequency of Communication with Friends and Family: More than three times a week    Frequency of Social Gatherings with Friends and Family: Once a week    Attends Religious Services: Never    Database administrator or Organizations: No    Attends Engineer, structural: Never    Marital Status: Married    Tobacco Counseling Counseling given: Yes Tobacco comments: Uses dip pouches occasionally   Clinical Intake:  Pre-visit preparation completed: Yes  Pain : No/denies pain     BMI - recorded: 37.28 Nutritional Status: BMI > 30  Obese Nutritional Risks: None Diabetes: Yes CBG done?: No (telehealth visit) Did pt. bring in CBG monitor from home?: No  How often do you need to have someone help you when you read instructions, pamphlets, or other written materials from your doctor or pharmacy?: 1 - Never  Interpreter Needed?: No  Information entered by ::  Nuh Lipton, CMA   Activities of Daily Living    02/10/2023    8:09 AM 05/26/2022    4:30 PM  In your present state of health, do you have any difficulty performing the following activities:   Hearing?  0   Vision? 0   Difficulty concentrating or making decisions? 0   Walking or climbing stairs? 0   Dressing or bathing? 0   Doing errands, shopping? 0 0  Preparing Food and eating ? N   Using the Toilet? N   In the past six months, have you accidently leaked urine? N   Do you have problems with loss of bowel control? N   Managing your Medications? N   Managing your Finances? N   Housekeeping or managing your Housekeeping? N     Patient Care Team: Babs Sciara, MD as PCP - General (Family Medicine) Jena Gauss Gerrit Friends, MD as Consulting Physician (Gastroenterology) Randa Lynn, MD as Consulting Physician (Nephrology) Doreatha Massed, MD as Medical Oncologist (Hematology)  Indicate any recent Medical Services you may have received from other than [facility] providers in the past year (date may be approximate).     Assessment:   This is a routine wellness examination for Audel.  Hearing/Vision screen Hearing Screening - Comments:: Patient denies any hearing difficulties.   Vision Screening - Comments:: Wears rx glasses - up to date with routine eye exams  Dr.Dagley   Goals Addressed             This Visit's Progress    Patient Stated       Get to back to where I can go where I want        Depression Screen    02/10/2023    8:11 AM 11/14/2022    9:15 AM 07/13/2022   11:01 AM 05/31/2022    9:54 AM 05/03/2022    9:17 AM 04/14/2022   10:55 AM 01/04/2022    1:13 PM  PHQ 2/9 Scores  PHQ - 2 Score 0 1 2 1 3 3  0  PHQ- 9 Score 0 4 5 4 18 14  0    Fall Risk    02/10/2023    8:10 AM 11/14/2022    9:15 AM 07/13/2022   11:01 AM 05/31/2022    9:54 AM 05/03/2022    9:17 AM  Fall Risk   Falls in the past year? 0 1 1 1 1   Number falls in past yr: 0 0 1 1 1   Injury with Fall? 0 1 1 1  0  Risk for fall due to : No Fall Risks      Follow up Falls prevention discussed        MEDICARE RISK AT HOME: Medicare Risk at Home Any stairs in or around the home?: No If so,  are there any without handrails?: No Home free of loose throw rugs in walkways, pet beds, electrical cords, etc?: Yes Adequate lighting in your home to reduce risk of falls?: Yes Life alert?: No Use of a cane, walker or w/c?: No Grab bars in the bathroom?: Yes Shower chair or bench in shower?: Yes Elevated toilet seat or a handicapped toilet?: No  TIMED UP AND GO:  Was the test performed?  No    Cognitive Function:        02/10/2023    8:10 AM 01/04/2022    1:16 PM  6CIT Screen  What Year? 0 points 0 points  What month? 0 points 0 points  What time? 0 points 0 points  Count back from 20 0 points 0 points  Months in reverse 0 points 0 points  Repeat phrase 0 points 0 points  Total Score 0 points 0 points    Immunizations Immunization History  Administered  Date(s) Administered   Fluad Trivalent(High Dose 65+) 11/14/2022   Influenza Split 01/05/2014   Influenza,inj,Quad PF,6+ Mos 12/03/2015, 01/17/2018, 12/30/2020, 11/29/2021   Influenza-Unspecified 12/12/2016   PFIZER(Purple Top)SARS-COV-2 Vaccination 03/09/2020   PNEUMOCOCCAL CONJUGATE-20 11/14/2022   Pneumococcal Polysaccharide-23 12/19/2013   Tdap 04/14/2022    TDAP status: Up to date  Flu Vaccine status: Up to date  Pneumococcal vaccine status: Up to date  Covid-19 vaccine status: Information provided on how to obtain vaccines.   Qualifies for Shingles Vaccine? Yes   Zostavax completed No   Shingrix Completed?: No.    Education has been provided regarding the importance of this vaccine. Patient has been advised to call insurance company to determine out of pocket expense if they have not yet received this vaccine. Advised may also receive vaccine at local pharmacy or Health Dept. Verbalized acceptance and understanding.  Screening Tests Health Maintenance  Topic Date Due   Zoster Vaccines- Shingrix (1 of 2) Never done   Colonoscopy  Never done   COVID-19 Vaccine (2 - Pfizer risk series) 03/30/2020    Medicare Annual Wellness (AWV)  01/05/2023   FOOT EXAM  04/15/2023   HEMOGLOBIN A1C  05/18/2023   OPHTHALMOLOGY EXAM  10/10/2023   Diabetic kidney evaluation - Urine ACR  11/18/2023   Diabetic kidney evaluation - eGFR measurement  01/26/2024   DTaP/Tdap/Td (2 - Td or Tdap) 04/14/2032   Pneumonia Vaccine 39+ Years old  Completed   INFLUENZA VACCINE  Completed   Hepatitis C Screening  Completed   HIV Screening  Completed   HPV VACCINES  Aged Out    Health Maintenance  Health Maintenance Due  Topic Date Due   Zoster Vaccines- Shingrix (1 of 2) Never done   Colonoscopy  Never done   COVID-19 Vaccine (2 - Pfizer risk series) 03/30/2020   Medicare Annual Wellness (AWV)  01/05/2023    Colorectal Cancer Screening: Patient is currently scheduled to have a colonoscopy on  January 2025  Lung Cancer Screening: (Low Dose CT Chest recommended if Age 64-80 years, 20 pack-year currently smoking OR have quit w/in 15years.) does not qualify.   Lung Cancer Screening Referral: na  Additional Screening:  Hepatitis C Screening: does not qualify; Completed   Vision Screening: Recommended annual ophthalmology exams for early detection of glaucoma and other disorders of the eye. Is the patient up to date with their annual eye exam?  Yes  Who is the provider or what is the name of the office in which the patient attends annual eye exams? Dr. Earlene Plater If pt is not established with a provider, would they like to be referred to a provider to establish care? No .   Dental Screening: Recommended annual dental exams for proper oral hygiene  Diabetic Foot Exam: Diabetic Foot Exam: Completed 04/14/2022  Community Resource Referral / Chronic Care Management: CRR required this visit?  No   CCM required this visit?  No     Plan:     I have personally reviewed and noted the following in the patient's chart:   Medical and social history Use of alcohol, tobacco or illicit drugs  Current medications and  supplements including opioid prescriptions. Patient is not currently taking opioid prescriptions. Functional ability and status Nutritional status Physical activity Advanced directives List of other physicians Hospitalizations, surgeries, and ER visits in previous 12 months Vitals Screenings to include cognitive, depression, and falls Referrals and appointments  In addition, I have reviewed and discussed with patient certain preventive protocols, quality metrics,  and best practice recommendations. A written personalized care plan for preventive services as well as general preventive health recommendations were provided to patient.     Jordan Hawks Otniel Hoe, CMA   02/10/2023   After Visit Summary: (MyChart) Due to this being a telephonic visit, the after visit summary with patients personalized plan was offered to patient via MyChart   Nurse Notes: see routing comment

## 2023-02-11 NOTE — Telephone Encounter (Signed)
Based on current evidence with him having underlying liver related issue it would be recommended to be on a lower dose of Effexor I would recommend changing from the 150 mg daily down to a 75  taken 3 daily this could be taken at the same time essentially that would be 225 mg of Effexor rather than 300 mg please talk with patient make sure that this is okay with him then send me a block thank you

## 2023-02-11 NOTE — Progress Notes (Signed)
As for the Effexor seeing the note under refills no longer do I recommend 300 mg of Effexor a day I would recommend reducing that to 225 mg That would be 75 mg XR 3 taken daily As for spironolactone please verify with patient that he is doing 25 mg a day then he may have 90 with 1 refill If the directions are different according to the patient please let me know thank you

## 2023-02-13 NOTE — Telephone Encounter (Signed)
Left message to return call 

## 2023-02-15 ENCOUNTER — Other Ambulatory Visit: Payer: Self-pay | Admitting: Interventional Radiology

## 2023-02-15 DIAGNOSIS — K7460 Unspecified cirrhosis of liver: Secondary | ICD-10-CM

## 2023-02-15 NOTE — Progress Notes (Signed)
Reason for follow up: Patient seen in consultation today status post TIPS creation on 02/14/22 and TIPS revision on 05/09/22   Referring Physician(s): Eula Listen, MD, Ermalinda Memos, PA-C  History of present illness: HPI from last clinic visit 08/09/22 Bethann Berkshire L Orlie Schwinghammer. is a 67 y.o. male with history of NASH cirrhosis (Child Pugh B, MELD 15) with recurrent ascites and renal insufficiency preventing use of diuretics status post TIPS creation on 02/14/22.  He presents today via virtual telephone visit accompanied by his wife.    He is still requiring 1/week paracentesis.  He recently met with Annamarie Major and transplant listing initiation has occurred.  Activity increased.  Feeling much better.  Stringent with low sodium diet, eliminating fast foods.   During our last visit I discussed the possibility of a partial splenic embolization to aid in decreased portal hypertension if he continues to have significant ascites production. The procedure would also improve his thrombocytopenia. He has required 4 paracenteses since early June with his last procedure on October 05, 2022. 1.8 L was removed during that procedure.  He met with his GI doctor 01/20/23 and reported doing well with his regimen of spironolactone 25 mg and torsemide 10 mg. He has also not had any issues with hepatic encephalopathy, compliant with lactulose and rifaximin. He is undergoing evaluation for possible liver transplant.   He presents today to the IR outpatient clinic for follow up.  Planning for colonoscopy and EGD in January.  Follows up with Annamarie Major later this month.  He still has a few tests/evaluations pending prior to potential listing for transplant.    Past Medical History:  Diagnosis Date   ADD (attention deficit disorder)    ADHD    Anxiety 1987   mild (crowds, noises)   Anxiety    Attention deficit disorder    Cancer (HCC)    Cirrhosis of liver (HCC)    CVA (cerebral vascular accident) (HCC)    Depression     Diabetic neuropathy (HCC)    Essential hypertension, benign    Long-standing history, negative secondary workup   Headache    History of cardiac catheterization    Widely patent coronary and renal arteries November 2013   Hyperlipidemia    Hypertension    whitecoat   Obstructive sleep apnea    cpap   Prediabetes    Sleep apnea    TIA (transient ischemic attack)    Possible, 2007   Type 2 diabetes mellitus (HCC)    Venous stasis     Past Surgical History:  Procedure Laterality Date   ANTERIOR CERVICAL DECOMP/DISCECTOMY FUSION N/A 12/30/2015   Procedure: ANTERIOR CERVICAL DECOMPRESSION/DISCECTOMY FUSION C6 - C7 1 LEVEL;  Surgeon: Venita Lick, MD;  Location: [facility] OR;  Service: Orthopedics;  Laterality: N/A;   CARDIAC CATHETERIZATION  01/2012   negative   CARPAL TUNNEL RELEASE     CERVICAL SPINE NERVE BLOCK  05/17/2017   IR INTRAVASCULAR ULTRASOUND NON CORONARY  02/14/2022   IR PARACENTESIS  02/14/2022   IR PARACENTESIS  05/09/2022   IR RADIOLOGIST EVAL & MGMT  12/03/2021   IR RADIOLOGIST EVAL & MGMT  01/13/2022   IR RADIOLOGIST EVAL & MGMT  03/24/2022   IR RADIOLOGIST EVAL & MGMT  04/20/2022   IR RADIOLOGIST EVAL & MGMT  06/08/2022   IR RADIOLOGIST EVAL & MGMT  08/09/2022   IR TIPS  02/14/2022   IR TIPS REVISION MOD SED  05/09/2022   IR US GUIDE VASC ACCESS RIGHT  02/14/2022   IR US GUIDE VASC ACCESS RIGHT  02/14/2022   IR US GUIDE VASC ACCESS RIGHT  05/09/2022   KNEE SURGERY Right    NECK SURGERY     RADIOLOGY WITH ANESTHESIA N/A 02/14/2022   Procedure: TIPS;  Surgeon: Bennie Dallas, MD;  Location: [facility] OR;  Service: Radiology;  Laterality: N/A;   VASECTOMY      Allergies: Patient has no known allergies.  Medications: Prior to Admission medications   Medication Sig Start Date End Date Taking? Authorizing Provider  ascorbic Acid (VITAMIN C) 500 MG CPCR Take 500 mg by mouth daily.    [provider]  aspirin EC 81 MG tablet Take 81 mg by mouth daily. Swallow whole.     [provider]  Blood Glucose Monitoring Suppl DEVI 1 each by Does not apply route in the morning, at noon, and at bedtime. May substitute to any manufacturer covered by patient's insurance Dx E11.22 05/12/22   Babs Sciara, MD  Cholecalciferol (D3 VITAMIN PO) Take by mouth daily. 5000 IU daily    [provider]  Copper Gluconate 2 MG TABS TAKE 2 AND 1/2 TABLETS (5 MG TOTAL) BY MOUTH 2 (TWO) TIMES DAILY FOR 7 DAYS, THEN 2 AND 1/2 TABLETS (5 MG TOTAL) DAILY. 06/12/22   Carnella Guadalajara, PA-C  dorzolamide (TRUSOPT) 2 % ophthalmic solution 1 drop 2 (two) times daily. 04/04/22   [provider]  fluticasone (FLONASE) 50 MCG/ACT nasal spray SPRAY 2 SPRAYS INTO EACH NOSTRIL EVERY DAY 12/05/22   Babs Sciara, MD  Glucose Blood (BLOOD GLUCOSE TEST STRIPS) STRP Use to check blood sugar once daily before meals. May substitute to any manufacturer covered by patient's insurance E11.22 05/12/22   Babs Sciara, MD  lactulose (CHRONULAC) 10 GM/15ML solution Take 30 mLs (20 g total) by mouth 3 (three) times daily. 09/27/22 09/27/23  Letta Median, PA-C  Lancet Device MISC Use to check blood sugar once daily before meals. May substitute to any manufacturer covered by patient's insurance E11.22 05/12/22   Babs Sciara, MD  Lancets Misc. MISC Use to check blood sugar once daily before meals. May substitute to any manufacturer covered by patient's insurance 05/12/22   Babs Sciara, MD  metFORMIN (GLUCOPHAGE) 500 MG tablet TAKE 1 TABLET BY MOUTH EVERY DAY 12/29/22   Babs Sciara, MD  Omega-3 Fatty Acids (FISH OIL) 1000 MG CAPS Take by mouth daily. Patient not taking: Reported on 02/10/2023    [provider]  OVER THE COUNTER MEDICATION Take 2 tablets by mouth daily. Pure Encapsulation Copper    [provider]  pantoprazole (PROTONIX) 40 MG tablet TAKE 1 TABLET BY MOUTH EVERY DAY 11/09/22   Babs Sciara, MD  rifaximin (XIFAXAN) 550 MG TABS tablet Take 1 tablet  (550 mg total) by mouth 2 (two) times daily. 04/13/22   Rourk, Gerrit Friends, MD  sodium bicarbonate 650 MG tablet Take 650 mg by mouth 3 (three) times daily. Morning, evening, bedtime Patient not taking: Reported on 02/10/2023    [provider]  spironolactone (ALDACTONE) 25 MG tablet Take 25 mg by mouth daily. 05/23/22 05/23/23  [provider]  torsemide (DEMADEX) 10 MG tablet Take 10 mg by mouth daily. For two weeks 05/23/22 05/23/23  [provider]  venlafaxine XR (EFFEXOR-XR) 150 MG 24 hr capsule TAKE 1 CAPSULE BY MOUTH TWICE A DAY 12/29/22   Campbell Riches, NP  zinc gluconate 50 MG tablet Take 50  mg by mouth daily. Patient not taking: Reported on 02/10/2023    [provider]     Family History  Problem Relation Age of Onset   Colon cancer Mother    Hypertension Mother    CAD Mother    Breast cancer Mother    Cirrhosis Brother     Social History   Socioeconomic History   Marital status: Married    Spouse name: Not on file   Number of children: 4   Years of education: Not on file   Highest education level: 9th grade  Occupational History   Occupation: Systems developer   Tobacco Use   Smoking status: Never   Smokeless tobacco: Not on file   Tobacco comments:    Uses dip pouches occasionally  Vaping Use   Vaping status: Never Used  Substance and Sexual Activity   Alcohol use: No    Alcohol/week: 0.0 standard drinks of alcohol    Comment: Previously 1-2 cases of beer/week. patient states never on regular basis though. last etoh in 2013.    Drug use: No   Sexual activity: Not Currently  Other Topics Concern   Not on file  Social History Narrative   ** Merged History Encounter **       Social Determinants of Health   Financial Resource Strain: Patient Declined (02/10/2023)   Overall Financial Resource Strain (CARDIA)    Difficulty of Paying Living Expenses: Patient declined  Food Insecurity: Patient Declined (02/10/2023)   Hunger Vital Sign     Worried About Running Out of Food in the Last Year: Patient declined    Ran Out of Food in the Last Year: Patient declined  Transportation Needs: Unmet Transportation Needs (02/10/2023)   PRAPARE - Administrator, Civil Service (Medical): Yes    Lack of Transportation (Non-Medical): No  Physical Activity: Patient Declined (02/10/2023)   Exercise Vital Sign    Days of Exercise per Week: Patient declined    Minutes of Exercise per Session: Patient declined  Stress: Stress Concern Present (02/10/2023)   [employer] of Occupational Health - Occupational Stress Questionnaire    Feeling of Stress : Rather much  Social Connections: Moderately Isolated (02/10/2023)   Social Connection and Isolation Panel [NHANES]    Frequency of Communication with Friends and Family: More than three times a week    Frequency of Social Gatherings with Friends and Family: Once a week    Attends Religious Services: Never    Database administrator or Organizations: No    Attends Banker Meetings: Never    Marital Status: Married     Vital Signs: There were no vitals taken for this visit.  No physical examination was performed in lieu of virtual telephone clinic visit.  Imaging: TIPS 02/14/22  7+2 Viatorr, no post deployment balloon molding. Gradient from 22-->9   TIPS revision 05/09/22  Balloon expansion to 10 mm Gradient from 18 --> 8  Labs:  CBC: Recent Labs    07/19/22 0818 08/19/22 1006 09/16/22 0947 01/26/23 0936  WBC 6.2 5.9 5.4 4.4  HGB 10.1* 10.0* 10.5* 11.3*  HCT 30.4* 29.5* 30.8* 33.9*  PLT 133* 116* 131* 111*    COAGS: Recent Labs    05/24/22 1127 05/25/22 0447 05/26/22 0438 01/26/23 0936  INR 1.5* 1.8* 1.7* 1.3*    BMP: Recent Labs    05/18/22 1410 05/24/22 1127 05/25/22 0447 05/26/22 0438 01/26/23 0936  NA 139 139 141 142 145*  K 3.3* 3.6  2.9* 3.6 3.9  CL 110 108 109 110 109*  CO2 23 25 24 26 24   GLUCOSE 85 114* 121* 121* 100*   BUN 19 19 20 21 15   CALCIUM 8.4* 8.4* 8.4* 8.7* 8.5*  CREATININE 1.30* 1.38* 1.24 1.29* 1.37*  GFRNONAA >60 57* >60 >60  --     LIVER FUNCTION TESTS: Recent Labs    05/24/22 1127 05/25/22 0447 05/26/22 0438 07/05/22 0812 09/16/22 0947 01/26/23 0936  BILITOT 1.7* 1.9* 1.9* 1.2 2.0* 2.5*  AST 57* 44* 47*  --   --  68*  ALT 31 24 24   --   --  37  ALKPHOS 144* 112 105  --   --  144*  PROT 5.3* 5.3* 5.5*  --   --  5.2*  ALBUMIN 2.3* 3.0* 3.1*  --   --  2.2*    Assessment and Plan: Gabrian Duddy. is a 67 y.o. male with history of MASH cirrhosis (Child Pugh B9, MELD 15 pre TIPS -->16 currently) with recurrent ascites and renal insufficiency preventing use of diuretics status post TIPS creation on 02/14/22 and TIPS revision on 05/09/22 due to persistent ascites.  He is overall doing very well, without ascites recurrence or encephalopathy.  He continues evaluation for liver transplant.  Follow up in 6 months in IR clinic.  Marliss Coots, MD Pager: [phone] Clinic: [phone]    I spent a total of 25 Minutes in face to face in clinical consultation, greater than 50% of which was counseling/coordinating care for portal hypertension.

## 2023-02-16 ENCOUNTER — Ambulatory Visit
Admission: RE | Admit: 2023-02-16 | Discharge: 2023-02-16 | Disposition: A | Discharge status: Home / Self Care | Payer: Medicare Other | Source: Ambulatory Visit | Attending: Interventional Radiology | Admitting: Interventional Radiology

## 2023-02-16 DIAGNOSIS — K7581 Nonalcoholic steatohepatitis (NASH): Secondary | ICD-10-CM | POA: Diagnosis not present

## 2023-02-16 DIAGNOSIS — K7460 Unspecified cirrhosis of liver: Secondary | ICD-10-CM

## 2023-02-16 DIAGNOSIS — K766 Portal hypertension: Secondary | ICD-10-CM | POA: Diagnosis not present

## 2023-02-16 HISTORY — PX: IR RADIOLOGIST EVAL & MGMT: IMG5224

## 2023-02-18 DIAGNOSIS — I129 Hypertensive chronic kidney disease with stage 1 through stage 4 chronic kidney disease, or unspecified chronic kidney disease: Secondary | ICD-10-CM | POA: Diagnosis not present

## 2023-02-18 DIAGNOSIS — E1122 Type 2 diabetes mellitus with diabetic chronic kidney disease: Secondary | ICD-10-CM | POA: Diagnosis not present

## 2023-02-18 DIAGNOSIS — R188 Other ascites: Secondary | ICD-10-CM | POA: Diagnosis not present

## 2023-02-18 DIAGNOSIS — I5032 Chronic diastolic (congestive) heart failure: Secondary | ICD-10-CM | POA: Diagnosis not present

## 2023-02-20 ENCOUNTER — Other Ambulatory Visit: Payer: Self-pay

## 2023-02-21 NOTE — Telephone Encounter (Signed)
Called pt, no answer left message

## 2023-02-24 DIAGNOSIS — Z01818 Encounter for other preprocedural examination: Secondary | ICD-10-CM | POA: Diagnosis not present

## 2023-02-24 DIAGNOSIS — Z1281 Encounter for screening for malignant neoplasm of oral cavity: Secondary | ICD-10-CM | POA: Diagnosis not present

## 2023-02-24 DIAGNOSIS — K7460 Unspecified cirrhosis of liver: Secondary | ICD-10-CM | POA: Diagnosis not present

## 2023-02-24 DIAGNOSIS — K7682 Hepatic encephalopathy: Secondary | ICD-10-CM | POA: Diagnosis not present

## 2023-02-24 DIAGNOSIS — K7581 Nonalcoholic steatohepatitis (NASH): Secondary | ICD-10-CM | POA: Diagnosis not present

## 2023-02-24 DIAGNOSIS — R188 Other ascites: Secondary | ICD-10-CM | POA: Diagnosis not present

## 2023-02-24 DIAGNOSIS — E440 Moderate protein-calorie malnutrition: Secondary | ICD-10-CM | POA: Diagnosis not present

## 2023-03-01 ENCOUNTER — Encounter: Payer: Self-pay | Admitting: Family Medicine

## 2023-03-01 ENCOUNTER — Other Ambulatory Visit: Payer: Self-pay

## 2023-03-01 ENCOUNTER — Encounter (HOSPITAL_COMMUNITY): Payer: Self-pay | Admitting: Emergency Medicine

## 2023-03-01 ENCOUNTER — Inpatient Hospital Stay (HOSPITAL_COMMUNITY)
Admission: EM | Admit: 2023-03-01 | Discharge: 2023-03-03 | DRG: 064 | Disposition: A | Discharge status: Home Health | Payer: Medicare Other | Attending: Internal Medicine | Admitting: Internal Medicine

## 2023-03-01 ENCOUNTER — Emergency Department (HOSPITAL_COMMUNITY): Payer: Medicare Other

## 2023-03-01 DIAGNOSIS — E785 Hyperlipidemia, unspecified: Secondary | ICD-10-CM | POA: Diagnosis not present

## 2023-03-01 DIAGNOSIS — G9341 Metabolic encephalopathy: Secondary | ICD-10-CM | POA: Diagnosis not present

## 2023-03-01 DIAGNOSIS — R000 Tachycardia, unspecified: Secondary | ICD-10-CM | POA: Diagnosis not present

## 2023-03-01 DIAGNOSIS — Z8249 Family history of ischemic heart disease and other diseases of the circulatory system: Secondary | ICD-10-CM

## 2023-03-01 DIAGNOSIS — Z95828 Presence of other vascular implants and grafts: Secondary | ICD-10-CM

## 2023-03-01 DIAGNOSIS — I6389 Other cerebral infarction: Principal | ICD-10-CM | POA: Diagnosis present

## 2023-03-01 DIAGNOSIS — Z800 Family history of malignant neoplasm of digestive organs: Secondary | ICD-10-CM

## 2023-03-01 DIAGNOSIS — R531 Weakness: Secondary | ICD-10-CM | POA: Diagnosis not present

## 2023-03-01 DIAGNOSIS — K7682 Hepatic encephalopathy: Principal | ICD-10-CM

## 2023-03-01 DIAGNOSIS — R4781 Slurred speech: Secondary | ICD-10-CM | POA: Diagnosis present

## 2023-03-01 DIAGNOSIS — G4733 Obstructive sleep apnea (adult) (pediatric): Secondary | ICD-10-CM | POA: Diagnosis present

## 2023-03-01 DIAGNOSIS — E66812 Obesity, class 2: Secondary | ICD-10-CM | POA: Diagnosis present

## 2023-03-01 DIAGNOSIS — E1169 Type 2 diabetes mellitus with other specified complication: Secondary | ICD-10-CM | POA: Diagnosis not present

## 2023-03-01 DIAGNOSIS — R188 Other ascites: Secondary | ICD-10-CM | POA: Diagnosis not present

## 2023-03-01 DIAGNOSIS — F324 Major depressive disorder, single episode, in partial remission: Secondary | ICD-10-CM | POA: Diagnosis not present

## 2023-03-01 DIAGNOSIS — Z79899 Other long term (current) drug therapy: Secondary | ICD-10-CM | POA: Diagnosis not present

## 2023-03-01 DIAGNOSIS — Z803 Family history of malignant neoplasm of breast: Secondary | ICD-10-CM

## 2023-03-01 DIAGNOSIS — Z7982 Long term (current) use of aspirin: Secondary | ICD-10-CM | POA: Diagnosis not present

## 2023-03-01 DIAGNOSIS — F909 Attention-deficit hyperactivity disorder, unspecified type: Secondary | ICD-10-CM | POA: Diagnosis present

## 2023-03-01 DIAGNOSIS — R0689 Other abnormalities of breathing: Secondary | ICD-10-CM | POA: Diagnosis not present

## 2023-03-01 DIAGNOSIS — Z6835 Body mass index (BMI) 35.0-35.9, adult: Secondary | ICD-10-CM | POA: Diagnosis not present

## 2023-03-01 DIAGNOSIS — R29703 NIHSS score 3: Secondary | ICD-10-CM | POA: Diagnosis present

## 2023-03-01 DIAGNOSIS — R29810 Facial weakness: Secondary | ICD-10-CM | POA: Diagnosis present

## 2023-03-01 DIAGNOSIS — Z7984 Long term (current) use of oral hypoglycemic drugs: Secondary | ICD-10-CM | POA: Diagnosis not present

## 2023-03-01 DIAGNOSIS — I639 Cerebral infarction, unspecified: Principal | ICD-10-CM | POA: Diagnosis present

## 2023-03-01 DIAGNOSIS — K7460 Unspecified cirrhosis of liver: Secondary | ICD-10-CM | POA: Diagnosis not present

## 2023-03-01 DIAGNOSIS — E1140 Type 2 diabetes mellitus with diabetic neuropathy, unspecified: Secondary | ICD-10-CM | POA: Diagnosis present

## 2023-03-01 DIAGNOSIS — G8194 Hemiplegia, unspecified affecting left nondominant side: Secondary | ICD-10-CM | POA: Diagnosis present

## 2023-03-01 DIAGNOSIS — Z8673 Personal history of transient ischemic attack (TIA), and cerebral infarction without residual deficits: Secondary | ICD-10-CM | POA: Diagnosis not present

## 2023-03-01 DIAGNOSIS — R7989 Other specified abnormal findings of blood chemistry: Secondary | ICD-10-CM | POA: Diagnosis present

## 2023-03-01 DIAGNOSIS — I10 Essential (primary) hypertension: Secondary | ICD-10-CM | POA: Diagnosis present

## 2023-03-01 LAB — URINALYSIS, ROUTINE W REFLEX MICROSCOPIC
Bacteria, UA: NONE SEEN
Bilirubin Urine: NEGATIVE
Glucose, UA: NEGATIVE mg/dL
Ketones, ur: NEGATIVE mg/dL
Leukocytes,Ua: NEGATIVE
Nitrite: NEGATIVE
Protein, ur: NEGATIVE mg/dL
Specific Gravity, Urine: 1.013 (ref 1.005–1.030)
pH: 6 (ref 5.0–8.0)

## 2023-03-01 LAB — CBC
HCT: 36.8 % — ABNORMAL LOW (ref 39.0–52.0)
Hemoglobin: 13 g/dL (ref 13.0–17.0)
MCH: 34.9 pg — ABNORMAL HIGH (ref 26.0–34.0)
MCHC: 35.3 g/dL (ref 30.0–36.0)
MCV: 98.7 fL (ref 80.0–100.0)
Platelets: 134 10*3/uL — ABNORMAL LOW (ref 150–400)
RBC: 3.73 MIL/uL — ABNORMAL LOW (ref 4.22–5.81)
RDW: 15.7 % — ABNORMAL HIGH (ref 11.5–15.5)
WBC: 6.6 10*3/uL (ref 4.0–10.5)
nRBC: 0 % (ref 0.0–0.2)

## 2023-03-01 LAB — PROTIME-INR
INR: 1.3 — ABNORMAL HIGH (ref 0.8–1.2)
Prothrombin Time: 16.5 s — ABNORMAL HIGH (ref 11.4–15.2)

## 2023-03-01 LAB — COMPREHENSIVE METABOLIC PANEL
ALT: 47 U/L — ABNORMAL HIGH (ref 0–44)
AST: 75 U/L — ABNORMAL HIGH (ref 15–41)
Albumin: 2.3 g/dL — ABNORMAL LOW (ref 3.5–5.0)
Alkaline Phosphatase: 129 U/L — ABNORMAL HIGH (ref 38–126)
Anion gap: 9 (ref 5–15)
BUN: 17 mg/dL (ref 8–23)
CO2: 23 mmol/L (ref 22–32)
Calcium: 9.2 mg/dL (ref 8.9–10.3)
Chloride: 110 mmol/L (ref 98–111)
Creatinine, Ser: 1.43 mg/dL — ABNORMAL HIGH (ref 0.61–1.24)
GFR, Estimated: 54 mL/min — ABNORMAL LOW (ref >60)
Glucose, Bld: 119 mg/dL — ABNORMAL HIGH (ref 70–99)
Potassium: 3.2 mmol/L — ABNORMAL LOW (ref 3.5–5.1)
Sodium: 142 mmol/L (ref 135–145)
Total Bilirubin: 3.4 mg/dL — ABNORMAL HIGH (ref <1.2)
Total Protein: 6.1 g/dL — ABNORMAL LOW (ref 6.5–8.1)

## 2023-03-01 LAB — POCT I-STAT, CHEM 8
BUN: 16 mg/dL (ref 8–23)
Calcium, Ion: 1.15 mmol/L (ref 1.15–1.40)
Chloride: 110 mmol/L (ref 98–111)
Creatinine, Ser: 1.5 mg/dL — ABNORMAL HIGH (ref 0.61–1.24)
Glucose, Bld: 114 mg/dL — ABNORMAL HIGH (ref 70–99)
HCT: 36 % — ABNORMAL LOW (ref 39.0–52.0)
Hemoglobin: 12.2 g/dL — ABNORMAL LOW (ref 13.0–17.0)
Potassium: 3.3 mmol/L — ABNORMAL LOW (ref 3.5–5.1)
Sodium: 144 mmol/L (ref 135–145)
TCO2: 23 mmol/L (ref 22–32)

## 2023-03-01 LAB — DIFFERENTIAL
Abs Immature Granulocytes: 0.03 10*3/uL (ref 0.00–0.07)
Basophils Absolute: 0.1 10*3/uL (ref 0.0–0.1)
Basophils Relative: 1 %
Eosinophils Absolute: 0.3 10*3/uL (ref 0.0–0.5)
Eosinophils Relative: 4 %
Immature Granulocytes: 1 %
Lymphocytes Relative: 16 %
Lymphs Abs: 1.1 10*3/uL (ref 0.7–4.0)
Monocytes Absolute: 1 10*3/uL (ref 0.1–1.0)
Monocytes Relative: 15 %
Neutro Abs: 4.2 10*3/uL (ref 1.7–7.7)
Neutrophils Relative %: 63 %

## 2023-03-01 LAB — ETHANOL: Alcohol, Ethyl (B): <10 mg/dL (ref <10)

## 2023-03-01 LAB — APTT: aPTT: 36 s (ref 24–36)

## 2023-03-01 LAB — RAPID URINE DRUG SCREEN, HOSP PERFORMED
Amphetamines: NOT DETECTED
Barbiturates: NOT DETECTED
Benzodiazepines: NOT DETECTED
Cocaine: NOT DETECTED
Opiates: NOT DETECTED
Tetrahydrocannabinol: POSITIVE — AB

## 2023-03-01 LAB — AMMONIA: Ammonia: 74 umol/L — ABNORMAL HIGH (ref 9–35)

## 2023-03-01 MED ORDER — ACETAMINOPHEN 160 MG/5ML PO SOLN: 650.0000 mg | ORAL | Status: DC | PRN

## 2023-03-01 MED ORDER — POTASSIUM CHLORIDE 20 MEQ PO PACK
40.0000 meq | PACK | Freq: Once | ORAL | Status: AC
  Administered 2023-03-01: 40 meq via ORAL
  Filled 2023-03-01: qty 2

## 2023-03-01 MED ORDER — VENLAFAXINE HCL ER 75 MG PO CP24
150.0000 mg | ORAL_CAPSULE | Freq: Two times a day (BID) | ORAL | Status: DC
  Administered 2023-03-01 – 2023-03-03 (×4): 150 mg via ORAL
  Filled 2023-03-01 (×4): qty 2

## 2023-03-01 MED ORDER — DORZOLAMIDE HCL 2 % OP SOLN
1.0000 [drp] | Freq: Two times a day (BID) | OPHTHALMIC | Status: DC
  Administered 2023-03-01 – 2023-03-03 (×4): 1 [drp] via OPHTHALMIC
  Filled 2023-03-01 (×3): qty 10

## 2023-03-01 MED ORDER — LACTULOSE 10 GM/15ML PO SOLN
30.0000 g | Freq: Three times a day (TID) | ORAL | Status: DC
Start: 2023-03-01 — End: 2023-03-03
  Administered 2023-03-01 – 2023-03-03 (×6): 30 g via ORAL
  Filled 2023-03-01 (×6): qty 60

## 2023-03-01 MED ORDER — ASPIRIN 81 MG PO TBEC
81.0000 mg | DELAYED_RELEASE_TABLET | Freq: Every day | ORAL | Status: DC
  Administered 2023-03-02 – 2023-03-03 (×2): 81 mg via ORAL
  Filled 2023-03-01 (×2): qty 1

## 2023-03-01 MED ORDER — IOHEXOL 350 MG/ML SOLN
100.0000 mL | Freq: Once | INTRAVENOUS | Status: AC | PRN
  Administered 2023-03-01: 100 mL via INTRAVENOUS

## 2023-03-01 MED ORDER — PANTOPRAZOLE SODIUM 40 MG PO TBEC
40.0000 mg | DELAYED_RELEASE_TABLET | Freq: Every day | ORAL | Status: DC
Start: 2023-03-02 — End: 2023-03-03
  Administered 2023-03-02 – 2023-03-03 (×2): 40 mg via ORAL
  Filled 2023-03-01 (×2): qty 1

## 2023-03-01 MED ORDER — STROKE: EARLY STAGES OF RECOVERY BOOK: Freq: Once | Status: AC

## 2023-03-01 MED ORDER — RIFAXIMIN 550 MG PO TABS
550.0000 mg | ORAL_TABLET | Freq: Two times a day (BID) | ORAL | Status: DC
  Administered 2023-03-01 – 2023-03-03 (×4): 550 mg via ORAL
  Filled 2023-03-01 (×4): qty 1

## 2023-03-01 MED ORDER — CLOPIDOGREL BISULFATE 75 MG PO TABS
75.0000 mg | ORAL_TABLET | Freq: Once | ORAL | Status: AC
  Administered 2023-03-01: 75 mg via ORAL
  Filled 2023-03-01: qty 1

## 2023-03-01 MED ORDER — LACTULOSE 10 GM/15ML PO SOLN
30.0000 g | Freq: Once | ORAL | Status: AC
  Administered 2023-03-01: 30 g via ORAL
  Filled 2023-03-01: qty 60

## 2023-03-01 MED ORDER — ENOXAPARIN SODIUM 40 MG/0.4ML IJ SOSY
40.0000 mg | PREFILLED_SYRINGE | INTRAMUSCULAR | Status: DC
  Administered 2023-03-01 – 2023-03-02 (×2): 40 mg via SUBCUTANEOUS
  Filled 2023-03-01 (×2): qty 0.4

## 2023-03-01 MED ORDER — SENNOSIDES-DOCUSATE SODIUM 8.6-50 MG PO TABS: 1.0000 | ORAL_TABLET | Freq: Every evening | ORAL | Status: DC | PRN

## 2023-03-01 MED ORDER — SODIUM CHLORIDE 0.9 % IV SOLN: INTRAVENOUS | Status: AC

## 2023-03-01 MED ORDER — CLOPIDOGREL BISULFATE 75 MG PO TABS
300.0000 mg | ORAL_TABLET | Freq: Once | ORAL | Status: DC
Start: 2023-03-01 — End: 2023-03-01

## 2023-03-01 MED ORDER — ACETAMINOPHEN 325 MG PO TABS: 650.0000 mg | ORAL_TABLET | ORAL | Status: DC | PRN

## 2023-03-01 MED ORDER — ACETAMINOPHEN 650 MG RE SUPP: 650.0000 mg | RECTAL | Status: DC | PRN

## 2023-03-01 NOTE — ED Notes (Signed)
Accidentally clicked MRI orders completed at 1610, but MRI is not here today.

## 2023-03-01 NOTE — H&P (Addendum)
History and Physical    Shawn [name]. GNF:[id number] DOB: 1955/03/24 DOA: 03/01/2023  PCP: Babs Sciara, MD   Patient coming from: Home  I have personally briefly reviewed patient's old medical records in [facility]  Chief Complaint: Confusion  HPI: Shawn [name]. is a 67 y.o. male with medical history significant for liver cirrhosis, TIPS, SBP, diabetes mellitus, hypertension. Patient was brought to the ED with reports of confusion.  At the time of my evaluation, patient is awake alert, able to answer some questions but speech is sometimes confused.  Spouse is at bedside and assist with the history.  She reports confusion started this morning, and patient not knowing what to do with his pills, or his walker, not talking much, she also reports he was unsteady on his feet.  She reports this is typical of when his ammonia is high. Reports he has been compliant with his medications rifaximin and lactulose, with 3 bowel movements daily, he has not missed any doses recently.  No vomiting no diarrhea has had stable oral intake.  No cough, no difficulty breathing, no fevers no urinary symptoms. Reports when EMS got there, patient was confused, from with poor patient effort they thought the patient had weakness to his left side and facial droop.  On arrival to the ED this was not evident.  ED Course: Tmax 99.2.  Heart rate 102-109.  Respiratory rate 13-17.  Blood pressure 118-149 systolic.  Sats 99% on room air. On arrival to the ED no focal deficits were elicited. Ammonia 74.  UA not suggestive of UTI.  UDS positive for THC. Head CT-no acute abnormality. CT head and neck-no significant stenosis of mismatch on CT perfusion. Neurologist was consulted-Dr. Thad Ranger recommended admission, MRI, TIA/acute infarct remains on differential would also consider encephalopathy, possibly related to liver disease.  Review of Systems: As per HPI all other systems reviewed and negative.  Past  Medical History:  Diagnosis Date   ADD (attention deficit disorder)    ADHD    Anxiety 1987   mild (crowds, noises)   Anxiety    Attention deficit disorder    Cancer (HCC)    Cirrhosis of liver (HCC)    CVA (cerebral vascular accident) (HCC)    Depression    Diabetic neuropathy (HCC)    Essential hypertension, benign    Long-standing history, negative secondary workup   Headache    History of cardiac catheterization    Widely patent coronary and renal arteries November 2013   Hyperlipidemia    Hypertension    whitecoat   Obstructive sleep apnea    cpap   Prediabetes    Sleep apnea    TIA (transient ischemic attack)    Possible, 2007   Type 2 diabetes mellitus (HCC)    Venous stasis     Past Surgical History:  Procedure Laterality Date   ANTERIOR CERVICAL DECOMP/DISCECTOMY FUSION N/A 12/30/2015   Procedure: ANTERIOR CERVICAL DECOMPRESSION/DISCECTOMY FUSION C6 - C7 1 LEVEL;  Surgeon: Venita Lick, MD;  Location: [facility] OR;  Service: Orthopedics;  Laterality: N/A;   CARDIAC CATHETERIZATION  01/2012   negative   CARPAL TUNNEL RELEASE     CERVICAL SPINE NERVE BLOCK  05/17/2017   IR INTRAVASCULAR ULTRASOUND NON CORONARY  02/14/2022   IR PARACENTESIS  02/14/2022   IR PARACENTESIS  05/09/2022   IR RADIOLOGIST EVAL & MGMT  12/03/2021   IR RADIOLOGIST EVAL & MGMT  01/13/2022   IR RADIOLOGIST EVAL & MGMT  03/24/2022   IR RADIOLOGIST EVAL & MGMT  04/20/2022   IR RADIOLOGIST EVAL & MGMT  06/08/2022   IR RADIOLOGIST EVAL & MGMT  08/09/2022   IR RADIOLOGIST EVAL & MGMT  02/16/2023   IR TIPS  02/14/2022   IR TIPS REVISION MOD SED  05/09/2022   IR US GUIDE VASC ACCESS RIGHT  02/14/2022   IR US GUIDE VASC ACCESS RIGHT  02/14/2022   IR US GUIDE VASC ACCESS RIGHT  05/09/2022   KNEE SURGERY Right    NECK SURGERY     RADIOLOGY WITH ANESTHESIA N/A 02/14/2022   Procedure: TIPS;  Surgeon: Bennie Dallas, MD;  Location: [facility] OR;  Service: Radiology;  Laterality: N/A;   VASECTOMY       reports that  he has never smoked. He does not have any smokeless tobacco history on file. He reports that he does not drink alcohol and does not use drugs.  No Known Allergies  Family History  Problem Relation Age of Onset   Colon cancer Mother    Hypertension Mother    CAD Mother    Breast cancer Mother    Cirrhosis Brother     Prior to Admission medications   Medication Sig Start Date End Date Taking? Authorizing Provider  ascorbic Acid (VITAMIN C) 500 MG CPCR Take 500 mg by mouth daily.   Yes [provider]  aspirin EC 81 MG tablet Take 81 mg by mouth daily. Swallow whole.   Yes [provider]  Cholecalciferol (D3 VITAMIN PO) Take by mouth daily. 5000 IU daily   Yes [provider]  Copper Gluconate 2 MG TABS TAKE 2 AND 1/2 TABLETS (5 MG TOTAL) BY MOUTH 2 (TWO) TIMES DAILY FOR 7 DAYS, THEN 2 AND 1/2 TABLETS (5 MG TOTAL) DAILY. 06/12/22  Yes Pennington, Rebekah M, PA-C  dorzolamide (TRUSOPT) 2 % ophthalmic solution Place 1 drop into both eyes 2 (two) times daily. 04/04/22  Yes [provider]  fluticasone (FLONASE) 50 MCG/ACT nasal spray SPRAY 2 SPRAYS INTO EACH NOSTRIL EVERY DAY Patient taking differently: Place 1 spray into both nostrils daily as needed for rhinitis or allergies. 12/05/22  Yes Luking, Jonna Coup, MD  lactulose (CHRONULAC) 10 GM/15ML solution Take 30 mLs (20 g total) by mouth 3 (three) times daily. 09/27/22 09/27/23 Yes Harper, Kristen S, PA-C  metFORMIN (GLUCOPHAGE) 500 MG tablet TAKE 1 TABLET BY MOUTH EVERY DAY 12/29/22  Yes Luking, Jonna Coup, MD  OVER THE COUNTER MEDICATION Take 2 tablets by mouth daily. Pure Encapsulation Copper   Yes [provider]  pantoprazole (PROTONIX) 40 MG tablet TAKE 1 TABLET BY MOUTH EVERY DAY 11/09/22  Yes Luking, Jonna Coup, MD  rifaximin (XIFAXAN) 550 MG TABS tablet Take 1 tablet (550 mg total) by mouth 2 (two) times daily. 04/13/22  Yes Rourk, Gerrit Friends, MD  spironolactone (ALDACTONE) 25 MG tablet Take 25 mg by mouth  daily. 05/23/22 05/23/23 Yes [provider]  torsemide (DEMADEX) 10 MG tablet Take 10 mg by mouth daily. For two weeks 05/23/22 05/23/23 Yes [provider]  venlafaxine XR (EFFEXOR-XR) 150 MG 24 hr capsule TAKE 1 CAPSULE BY MOUTH TWICE A DAY 12/29/22  Yes Campbell Riches, NP  Blood Glucose Monitoring Suppl DEVI 1 each by Does not apply route in the morning, at noon, and at bedtime. May substitute to any manufacturer covered by patient's insurance Dx E11.22 05/12/22   Babs Sciara, MD  Glucose Blood (BLOOD GLUCOSE TEST STRIPS) STRP Use to  check blood sugar once daily before meals. May substitute to any manufacturer covered by patient's insurance E11.22 05/12/22   Babs Sciara, MD  Lancet Device MISC Use to check blood sugar once daily before meals. May substitute to any manufacturer covered by patient's insurance E11.22 05/12/22   Babs Sciara, MD  Lancets Misc. MISC Use to check blood sugar once daily before meals. May substitute to any manufacturer covered by patient's insurance 05/12/22   Babs Sciara, MD    Physical Exam: Vitals:   03/01/23 1530 03/01/23 1600 03/01/23 1630 03/01/23 1700  BP: 136/77 118/67 (!) 147/87 (!) 144/69  Pulse: (!) 107 (!) 104 (!) 102 (!) 106  Resp: 14 14 17 17   Temp:      TempSrc:      SpO2: 97% 96% 96% 92%    Constitutional: Confused, calm, comfortable Vitals:   03/01/23 1530 03/01/23 1600 03/01/23 1630 03/01/23 1700  BP: 136/77 118/67 (!) 147/87 (!) 144/69  Pulse: (!) 107 (!) 104 (!) 102 (!) 106  Resp: 14 14 17 17   Temp:      TempSrc:      SpO2: 97% 96% 96% 92%   Eyes: PERRL, lids and conjunctivae normal ENMT: Mucous membranes are dry  Neck: normal, supple, no masses, no thyromegaly Respiratory: clear to auscultation bilaterally, no wheezing, no crackles. Normal respiratory effort. No accessory muscle use.  Cardiovascular: Regular rate and rhythm, no murmurs / rubs / gallops.  Chronic and unchanged trace bilateral pitting edema .   Extremities warm. Abdomen: no tenderness, no masses palpated. No hepatosplenomegaly. Bowel sounds positive.  Musculoskeletal: no clubbing / cyanosis. No joint deformity upper and lower extremities.  Skin: no rashes, lesions, ulcers. No induration Neurologic: No facial asymmetry, speech fluent but sometimes confused, 5 of 5 strength in bilateral upper and lower extremities.  Sensation intact. Psychiatric: Awake, alert, oriented to person and place but not time, time of the year it is, he keeps repeating his name.   Labs on Admission: I have personally reviewed following labs and imaging studies  CBC: Recent Labs  Lab 03/01/23 1244  WBC 6.6  NEUTROABS 4.2  HGB 13.0  12.2*  HCT 36.8*  36.0*  MCV 98.7  PLT 134*   Basic Metabolic Panel: Recent Labs  Lab 03/01/23 1244  NA 142  144  K 3.2*  3.3*  CL 110  110  CO2 23  GLUCOSE 119*  114*  BUN 17  16  CREATININE 1.43*  1.50*  CALCIUM 9.2   GFR: CrCl cannot be calculated (Unknown ideal weight.). Liver Function Tests: Recent Labs  Lab 03/01/23 1244  AST 75*  ALT 47*  ALKPHOS 129*  BILITOT 3.4*  PROT 6.1*  ALBUMIN 2.3*   No results for input(s): "LIPASE", "AMYLASE" in the last 168 hours. Recent Labs  Lab 03/01/23 1411  AMMONIA 74*   Coagulation Profile: Recent Labs  Lab 03/01/23 1244  INR 1.3*    Radiological Exams on Admission: CT ANGIO HEAD NECK W WO CM W PERF (CODE STROKE) Result Date: 03/01/2023 CLINICAL DATA:  Left-sided weakness and facial droop. New onset confusion. EXAM: CT ANGIOGRAPHY HEAD AND NECK CT PERFUSION BRAIN TECHNIQUE: Multidetector CT imaging of the head and neck was performed using the standard protocol during bolus administration of intravenous contrast. Multiplanar CT image reconstructions and MIPs were obtained to evaluate the vascular anatomy. Carotid stenosis measurements (when applicable) are obtained utilizing NASCET criteria, using the distal internal carotid diameter as the  denominator. Multiphase CT imaging  of the brain was performed following IV bolus contrast injection. Subsequent parametric perfusion maps were calculated using RAPID software. RADIATION DOSE REDUCTION: This exam was performed according to the departmental dose-optimization program which includes automated exposure control, adjustment of the mA and/or kV according to patient size and/or use of iterative reconstruction technique. CONTRAST:  OMNIPAQUE IOHEXOL 350 MG/ML SOLN COMPARISON:  CT head without contrast 03/01/2023 FINDINGS: CTA NECK FINDINGS Aortic arch: A 3 vessel arch configuration is present. No aneurysm or stenosis is present. No dissection is present. Right carotid system: Right common carotid artery is within normal limits. Minimal atherosclerotic changes are present at the bifurcation without significant stenosis. The cervical right ICA is otherwise normal. Left carotid system: Left common carotid artery is within normal limits. Minimal calcifications are present bifurcation without significant stenosis. The cervical left ICA is otherwise normal. Vertebral arteries: The left vertebral artery is slightly dominant to the right. Both vertebral arteries originate from the subclavian arteries without significant stenosis. No significant stenosis is present in either vertebral artery in the neck. Skeleton: Solid fusion is present at C5-6 and C6-7. Slight degenerative anterolisthesis is present at C3-4 and C4-5. No focal osseous lesions are present. The patient is edentulous. Other neck: Soft tissues the neck are otherwise unremarkable. Salivary glands are within normal limits. Thyroid is normal. No significant adenopathy is present. No focal mucosal or submucosal lesions are present. Upper chest: The lung apices are clear. Thoracic inlet is within normal limits. Review of the MIP images confirms the above findings CTA HEAD FINDINGS Anterior circulation: Internal carotid arteries are within normal limits  from the skull base to the ICA termini. The A1 and M1 segments are normal. The anterior communicating artery is patent. MCA bifurcations are within normal limits. The ACA and MCA branch vessels are normal bilaterally. Posterior circulation: The left vertebral artery is dominant vessel. PICA origins are visualized and normal. The vertebrobasilar junction and basilar artery are normal. The superior cerebellar arteries are patent. Both posterior cerebral arteries originate basilar tip. PCA branch vessels are normal bilaterally. Venous sinuses: The dural sinuses are patent. The straight sinus and deep cerebral veins are intact. Cortical veins are within normal limits. No significant vascular malformation is evident. Anatomic variants: None Review of the MIP images confirms the above findings CT Brain Perfusion Findings: ASPECTS: 10/10 CBF (<30%) Volume: 0mL Perfusion (Tmax>6.0s) volume: 25mL is estimated in the anterior right frontal lobe and inferior right temporal lobe. This is likely artifactual as there is significant patient motion creating artifact across the lower images. Mismatch Volume: 0mL IMPRESSION: 1. Minimal atherosclerotic changes at the carotid bifurcations bilaterally without significant stenosis. 2. Normal variant CTA Circle of Willis without significant proximal stenosis, aneurysm, or branch vessel occlusion. 3. No significant stenosis or mismatch on CT perfusion. Estimated area of with delayed T-max is likely artifactual 4. Solid fusion at C5-6 and C6-7. 5. Slight degenerative anterolisthesis at C3-4 and C4-5. The above was relayed via text pager to Dr. Thana Farr on 03/01/2023 at 13:31 . Electronically Signed   By: Marin Roberts M.D.   On: 03/01/2023 13:31   CT HEAD CODE STROKE WO CONTRAST Result Date: 03/01/2023 CLINICAL DATA:  Code stroke. Provided history: Neuro deficit, acute, stroke suspected. Left-sided weakness. Facial droop. New onset confusion. EXAM: CT HEAD WITHOUT CONTRAST  TECHNIQUE: Contiguous axial images were obtained from the base of the skull through the vertex without intravenous contrast. RADIATION DOSE REDUCTION: This exam was performed according to the departmental dose-optimization program which includes  automated exposure control, adjustment of the mA and/or kV according to patient size and/or use of iterative reconstruction technique. COMPARISON:  Head CT 03/24/2005. FINDINGS: Brain: Generalized cerebral atrophy. Patchy and ill-defined hypoattenuation within the cerebral white matter, nonspecific but compatible with moderate-to-advanced chronic small vessel ischemic disease. There is no acute intracranial hemorrhage. No demarcated cortical infarct. No extra-axial fluid collection. No evidence of an intracranial mass. No midline shift. Vascular: No hyperdense vessel. Skull: No calvarial fracture or aggressive osseous lesion. Sinuses/Orbits: No mass or acute finding within the imaged orbits. No significant paranasal sinus disease at the imaged levels. ASPECTS [facility] Stroke Program Early CT Score) - Ganglionic level infarction (caudate, lentiform nuclei, internal capsule, insula, M1-M3 cortex): 7 - Supraganglionic infarction (M4-M6 cortex): 3 Total score (0-10 with 10 being normal): 10 No evidence of an acute intracranial abnormality. These results were called by telephone at the time of interpretation on 03/01/2023 at 12:58 pm to provider [facility] ZAMMIT , who verbally acknowledged these results. IMPRESSION: 1.  No evidence of an acute intracranial abnormality. ASPECTS is 10. 2. Parenchymal atrophy and chronic small vessel ischemic disease. Electronically Signed   By: Jackey Loge D.O.   On: 03/01/2023 12:59    EKG: Independently reviewed.  Sinus tachycardia rate 111, QTc 464.  No significant ST/T wave change from prior.  Assessment/Plan Principal Problem:   Acute metabolic encephalopathy Active Problems:   Essential hypertension, benign   Hyperlipidemia associated  with type 2 diabetes mellitus (HCC)   Obstructive sleep apnea   Elevated LFTs   Cirrhosis of liver with ascites (HCC)   Depression, major, single episode, in partial remission (HCC)   S/P TIPS (transjugular intrahepatic portosystemic shunt)  Assessment and Plan: * Acute metabolic encephalopathy Presenting with confusion.  Likely hepatic in etiology.  Compliance with rifaximin and lactulose.  Initially reported left-sided weakness and facial droop reported by EMS, presents on brought to the ED although on my evaluation.  Spouse also reports she didn't notice these, but it was because patient was confused and patient was not fully cooperative.  Head CT CTA head and neck negative for acute abnormality or significant stenosis. -Evaluated by teleneurologist TIA acute infarcts still in differential, recommended stroke workup, other causes for encephalopathy -Per neurology, dual antiplatelet if evidence of acute infarct on MRI otherwise continue aspirin daily alone -Plavix given in ED, continue daily aspirin for now -UA not suggestive of UTI -MRI brain without contrast in a.m. -Lactulose 30 g 3 times daily for now, and rifaximin -Mucus membranes dry, gently hydrate N/s 75cc/hr x 12hrs  Depression, major, single episode, in partial remission (HCC) Resume venlafaxine  Cirrhosis of liver with ascites (HCC) Likely in hepatic encephalopathy.  Chronic elevation in liver enzymes, AST 75, ALT 47, ALP 129, T. Bili - 3.4.  INR 1.3. -Hold spironolactone and torsemide for now, hydrate gently  Hyperlipidemia associated with type 2 diabetes mellitus (HCC) Controlled.  A1c 4.6. Tight control. -Hold metformin  Essential hypertension, benign Stable. -Hold torsemide and spironolactone for now, gently hydrate   DVT prophylaxis: Lovenox Code Status: FULL Family Communication: Spouse at bedside Disposition Plan: ~ 2 days Consults called: Tele- Neurology Admission status:  Obs tele      Author: Onnie Boer, MD 03/01/2023 6:12 PM  For on call review [email or web address].

## 2023-03-01 NOTE — Assessment & Plan Note (Signed)
Controlled.  A1c 4.6. Tight control. -Hold metformin

## 2023-03-01 NOTE — ED Notes (Signed)
Pt transported to CT ?

## 2023-03-01 NOTE — ED Notes (Addendum)
I-stat completed   

## 2023-03-01 NOTE — Consult Note (Signed)
[facility] Telemedicine Consult   Requesting Provider: Estell Harpin Consult Participants: Nurse, Telespecialist nurse Location of the provider: [place], [place] Location of the patient: [facility]  This consult was provided via telemedicine with 2-way video and audio communication. The patient/family was informed that care would be provided in this way and agreed to receive care in this manner.    Chief Complaint: Slurred speech, left sided weakness, facial droop and confusion  HPI: 67 year old male with a history CVA/TIA, DM, HTN, HLD, cirrhosis of the liver and ADHD who went to bed last evening at baseline.  Awakened this morning and was confused.  Was noted by family to have facial droop, left sided weakness and difficulty waling as well.  With no improvement, EMS was called and transported patient for further evaluation.      LKW: 02/28/2023 @ 2300 tpa given?: No, Outside time window IR Thrombectomy? No, No target lesion identified Modified Rankin Scale: 1-No significant post stroke disability and can perform usual duties with stroke symptoms Time of teleneurologist evaluation: 1241  Exam: Vitals:   03/01/23 1330  BP: (!) 149/87  Pulse: (!) 109  Resp: 13  SpO2: 99%    General: NAD.  Confused with difficulty following commands  1A: Level of Consciousness - 0 1B: Ask Month and Age - 1 1C: 'Blink Eyes' & 'Squeeze Hands' - 1 2: Test Horizontal Extraocular Movements - 0 3: Test Visual Fields - 0 4: Test Facial Palsy - 0 5A: Test Left Arm Motor Drift - 0 5B: Test Right Arm Motor Drift - 0 6A: Test Left Leg Motor Drift - 0 6B: Test Right Leg Motor Drift - 0 7: Test Limb Ataxia - 0 8: Test Sensation - 1 9: Test Language/Aphasia- 0 10: Test Dysarthria - 0 11: Test Extinction/Inattention - 0 NIHSS score: 3   Imaging Reviewed:  CT HEAD WITHOUT CONTRAST   TECHNIQUE: Contiguous axial images were obtained from the base of the skull through the vertex without intravenous  contrast.   RADIATION DOSE REDUCTION: This exam was performed according to the departmental dose-optimization program which includes automated exposure control, adjustment of the mA and/or kV according to patient size and/or use of iterative reconstruction technique.   COMPARISON:  Head CT 03/24/2005.   FINDINGS: Brain:   Generalized cerebral atrophy.   Patchy and ill-defined hypoattenuation within the cerebral white matter, nonspecific but compatible with moderate-to-advanced chronic small vessel ischemic disease.   There is no acute intracranial hemorrhage.   No demarcated cortical infarct.   No extra-axial fluid collection.   No evidence of an intracranial mass.   No midline shift.   Vascular: No hyperdense vessel.   Skull: No calvarial fracture or aggressive osseous lesion.   Sinuses/Orbits: No mass or acute finding within the imaged orbits. No significant paranasal sinus disease at the imaged levels.   ASPECTS [facility] Stroke Program Early CT Score)   - Ganglionic level infarction (caudate, lentiform nuclei, internal capsule, insula, M1-M3 cortex): 7   - Supraganglionic infarction (M4-M6 cortex): 3   Total score (0-10 with 10 being normal): 10   No evidence of an acute intracranial abnormality. These results were called by telephone at the time of interpretation on 03/01/2023 at 12:58 pm to provider [facility] ZAMMIT , who verbally acknowledged these results.   IMPRESSION: 1.  No evidence of an acute intracranial abnormality. ASPECTS is 10. 2. Parenchymal atrophy and chronic small vessel ischemic disease.  CT ANGIOGRAPHY HEAD AND NECK   CT PERFUSION BRAIN  TECHNIQUE: Multidetector CT imaging of the head and neck was performed using the standard protocol during bolus administration of intravenous contrast. Multiplanar CT image reconstructions and MIPs were obtained to evaluate the vascular anatomy. Carotid stenosis measurements (when applicable) are  obtained utilizing NASCET criteria, using the distal internal carotid diameter as the denominator.   Multiphase CT imaging of the brain was performed following IV bolus contrast injection. Subsequent parametric perfusion maps were calculated using RAPID software.   RADIATION DOSE REDUCTION: This exam was performed according to the departmental dose-optimization program which includes automated exposure control, adjustment of the mA and/or kV according to patient size and/or use of iterative reconstruction technique.   CONTRAST:  OMNIPAQUE IOHEXOL 350 MG/ML SOLN   COMPARISON:  CT head without contrast 03/01/2023   FINDINGS: CTA NECK FINDINGS   Aortic arch: A 3 vessel arch configuration is present. No aneurysm or stenosis is present. No dissection is present.   Right carotid system: Right common carotid artery is within normal limits. Minimal atherosclerotic changes are present at the bifurcation without significant stenosis. The cervical right ICA is otherwise normal.   Left carotid system: Left common carotid artery is within normal limits. Minimal calcifications are present bifurcation without significant stenosis. The cervical left ICA is otherwise normal.   Vertebral arteries: The left vertebral artery is slightly dominant to the right. Both vertebral arteries originate from the subclavian arteries without significant stenosis. No significant stenosis is present in either vertebral artery in the neck.   Skeleton: Solid fusion is present at C5-6 and C6-7. Slight degenerative anterolisthesis is present at C3-4 and C4-5. No focal osseous lesions are present. The patient is edentulous.   Other neck: Soft tissues the neck are otherwise unremarkable. Salivary glands are within normal limits. Thyroid is normal. No significant adenopathy is present. No focal mucosal or submucosal lesions are present.   Upper chest: The lung apices are clear. Thoracic inlet is  within normal limits.   Review of the MIP images confirms the above findings   CTA HEAD FINDINGS   Anterior circulation: Internal carotid arteries are within normal limits from the skull base to the ICA termini. The A1 and M1 segments are normal. The anterior communicating artery is patent. MCA bifurcations are within normal limits. The ACA and MCA branch vessels are normal bilaterally.   Posterior circulation: The left vertebral artery is dominant vessel. PICA origins are visualized and normal. The vertebrobasilar junction and basilar artery are normal. The superior cerebellar arteries are patent. Both posterior cerebral arteries originate basilar tip. PCA branch vessels are normal bilaterally.   Venous sinuses: The dural sinuses are patent. The straight sinus and deep cerebral veins are intact. Cortical veins are within normal limits. No significant vascular malformation is evident.   Anatomic variants: None   Review of the MIP images confirms the above findings   CT Brain Perfusion Findings:   ASPECTS: 10/10   CBF (<30%) Volume: 0mL   Perfusion (Tmax>6.0s) volume: 25mL is estimated in the anterior right frontal lobe and inferior right temporal lobe. This is likely artifactual as there is significant patient motion creating artifact across the lower images.   Mismatch Volume: 0mL   IMPRESSION: 1. Minimal atherosclerotic changes at the carotid bifurcations bilaterally without significant stenosis. 2. Normal variant CTA Circle of Willis without significant proximal stenosis, aneurysm, or branch vessel occlusion. 3. No significant stenosis or mismatch on CT perfusion. Estimated area of with delayed T-max is likely artifactual 4. Solid fusion at C5-6 and C6-7.  5. Slight degenerative anterolisthesis at C3-4 and C4-5.      Labs reviewed in epic and pertinent values follow: CBG 102   Assessment: 67 year old male with a history CVA/TIA, DM, HTN, HLD, cirrhosis of the  liver and ADHD who went to bed last evening at baseline.  Awakened this morning and was confused.  Was noted by family to have facial droop, left sided weakness and difficulty waling as well.  Currently remains confused but no focality on examination other than some sensory changes that are ill-described per the patient.  Head CT personally reviewed and shows no acute changes.  CTA of the head and neck shows no evidence of LVO.  Perfusion results likely artifactual.  Although TIA/acute infarct remains on the differential would also consider encephalopathy, possibly related to lever disease.    Recommendations:  Continue ASA.  Give Plavix 75mg  now MRI of the brain without contrast.  If evidence of acute infarct would continue dual antiplatelet therapy with ASA 81mg  and Plavix 75mg .  Otherwise patient may continue on ASA daily alone.   Work up for toxic/metabolic, infectious possible causes of encephalopathy HgbA1c, fasting lipid panel Echocardiogram NPO until RN stroke swallow screen Telemetry monitoring Frequent neuro checks 9.   Permissive BP management for first 24-48 hours or until MRI rules out stroke with no treatment of SBP<200    This patient is receiving care for possible acute neurological changes. There was 50 minutes of care by this provider at the time of service, including time for direct evaluation via telemedicine, review of medical records, imaging studies and discussion of findings with providers.  Thana Farr, MD Neurology   If 7pm- 7am, please page neurology on call as listed in AMION.

## 2023-03-01 NOTE — ED Notes (Signed)
Cbg 102

## 2023-03-01 NOTE — Assessment & Plan Note (Addendum)
Presenting with confusion.  Likely hepatic in etiology.  Compliance with rifaximin and lactulose.  Initially reported left-sided weakness and facial droop reported by EMS, presents on brought to the ED although on my evaluation.  Spouse also reports she didn't notice these, but it was because patient was confused and patient was not fully cooperative.  Head CT CTA head and neck negative for acute abnormality or significant stenosis. -Evaluated by teleneurologist TIA acute infarcts still in differential, recommended stroke workup, other causes for encephalopathy -Per neurology, dual antiplatelet if evidence of acute infarct on MRI otherwise continue aspirin daily alone -Plavix given in ED, continue daily aspirin for now -UA not suggestive of UTI -MRI brain without contrast in a.m. -Lactulose 30 g 3 times daily for now, and rifaximin -Mucus membranes dry, gently hydrate N/s 75cc/hr x 12hrs

## 2023-03-01 NOTE — Assessment & Plan Note (Addendum)
Stable. -Hold torsemide and spironolactone for now, gently hydrate

## 2023-03-01 NOTE — Progress Notes (Signed)
1233 Code stroke cart activated- EMS pre-elert 1235 Dr Thad Ranger paged [id number] Door time- LKW 2300, L side weakness, off balance, slurred speech, confusion. Pt with h/o cirrhosis- TIPS procedure 1 yr ago, HTN, HLD, DM, CVA, TIA. BP 160/100, glucose 102 1241 pt to CT, Dr Thad Ranger on camera 1250 pt examined in CT- no TNK, LKW > 4.5 hrs

## 2023-03-01 NOTE — Assessment & Plan Note (Addendum)
Likely in hepatic encephalopathy.  Chronic elevation in liver enzymes, AST 75, ALT 47, ALP 129, T. Bili - 3.4.  INR 1.3. -Hold spironolactone and torsemide for now, hydrate gently

## 2023-03-01 NOTE — ED Triage Notes (Signed)
Pt arrived via [facility] emergency traffic code stroke called out in the field for L side weakness and facial droop as well as new onset confusion, Last seen well per EMS per family member was last night around 11pm, per ems per family member Sx discovered this morning. Also hypertension systolic in the 180s, also per EMS pt does not have a Hx of HTN. Cbg 102 on arrival to ED, MD at bedside

## 2023-03-01 NOTE — ED Provider Notes (Signed)
[facility] AT [facility] Provider Note   CSN: [id number] Arrival date & time: 03/01/23  1236  An emergency department physician performed an initial assessment on this suspected stroke patient at 1238.  History  Chief Complaint  Patient presents with   Code Stroke    Shawn [name]. is a 67 y.o. male.  Patient present with weakness of the left side and some slurred speech.  Patient has a history of cirrhosis and CVA.  Wife states that he was unsteady walking and had some confusion today.  She stated this was similar to when he had an elevated ammonia level from his cirrhosis.  The history is provided by the patient. No language interpreter was used.  Weakness Severity:  Moderate Onset quality:  Sudden Timing:  Constant Progression:  Waxing and waning Chronicity:  Recurrent Context: not alcohol use   Relieved by:  Nothing Worsened by:  Nothing Ineffective treatments:  None tried Associated symptoms: no abdominal pain, no chest pain, no cough, no diarrhea, no frequency, no headaches and no seizures        Home Medications Prior to Admission medications   Medication Sig Start Date End Date Taking? Authorizing Provider  ascorbic Acid (VITAMIN C) 500 MG CPCR Take 500 mg by mouth daily.    [provider]  aspirin EC 81 MG tablet Take 81 mg by mouth daily. Swallow whole.    [provider]  Blood Glucose Monitoring Suppl DEVI 1 each by Does not apply route in the morning, at noon, and at bedtime. May substitute to any manufacturer covered by patient's insurance Dx E11.22 05/12/22   Babs Sciara, MD  Cholecalciferol (D3 VITAMIN PO) Take by mouth daily. 5000 IU daily    [provider]  Copper Gluconate 2 MG TABS TAKE 2 AND 1/2 TABLETS (5 MG TOTAL) BY MOUTH 2 (TWO) TIMES DAILY FOR 7 DAYS, THEN 2 AND 1/2 TABLETS (5 MG TOTAL) DAILY. 06/12/22   Carnella Guadalajara, PA-C  dorzolamide (TRUSOPT) 2 % ophthalmic solution 1  drop 2 (two) times daily. 04/04/22   [provider]  fluticasone (FLONASE) 50 MCG/ACT nasal spray SPRAY 2 SPRAYS INTO EACH NOSTRIL EVERY DAY 12/05/22   Babs Sciara, MD  Glucose Blood (BLOOD GLUCOSE TEST STRIPS) STRP Use to check blood sugar once daily before meals. May substitute to any manufacturer covered by patient's insurance E11.22 05/12/22   Babs Sciara, MD  lactulose (CHRONULAC) 10 GM/15ML solution Take 30 mLs (20 g total) by mouth 3 (three) times daily. 09/27/22 09/27/23  Letta Median, PA-C  Lancet Device MISC Use to check blood sugar once daily before meals. May substitute to any manufacturer covered by patient's insurance E11.22 05/12/22   Babs Sciara, MD  Lancets Misc. MISC Use to check blood sugar once daily before meals. May substitute to any manufacturer covered by patient's insurance 05/12/22   Babs Sciara, MD  metFORMIN (GLUCOPHAGE) 500 MG tablet TAKE 1 TABLET BY MOUTH EVERY DAY 12/29/22   Babs Sciara, MD  Omega-3 Fatty Acids (FISH OIL) 1000 MG CAPS Take by mouth daily. Patient not taking: Reported on 02/10/2023    [provider]  OVER THE COUNTER MEDICATION Take 2 tablets by mouth daily. Pure Encapsulation Copper    [provider]  pantoprazole (PROTONIX) 40 MG tablet TAKE 1 TABLET BY MOUTH EVERY DAY 11/09/22   Babs Sciara, MD  rifaximin (XIFAXAN) 550 MG TABS tablet Take 1 tablet (  550 mg total) by mouth 2 (two) times daily. 04/13/22   Rourk, Gerrit Friends, MD  sodium bicarbonate 650 MG tablet Take 650 mg by mouth 3 (three) times daily. Morning, evening, bedtime Patient not taking: Reported on 02/10/2023    [provider]  spironolactone (ALDACTONE) 25 MG tablet Take 25 mg by mouth daily. 05/23/22 05/23/23  [provider]  torsemide (DEMADEX) 10 MG tablet Take 10 mg by mouth daily. For two weeks 05/23/22 05/23/23  [provider]  venlafaxine XR (EFFEXOR-XR) 150 MG 24 hr capsule TAKE 1 CAPSULE BY MOUTH TWICE A DAY 12/29/22    Campbell Riches, NP  zinc gluconate 50 MG tablet Take 50 mg by mouth daily. Patient not taking: Reported on 02/10/2023    [provider]      Allergies    Patient has no known allergies.    Review of Systems   Review of Systems  Constitutional:  Negative for appetite change and fatigue.  HENT:  Negative for congestion, ear discharge and sinus pressure.   Eyes:  Negative for discharge.  Respiratory:  Negative for cough.   Cardiovascular:  Negative for chest pain.  Gastrointestinal:  Negative for abdominal pain and diarrhea.  Genitourinary:  Negative for frequency and hematuria.  Musculoskeletal:  Negative for back pain.  Skin:  Negative for rash.  Neurological:  Positive for weakness. Negative for seizures and headaches.  Psychiatric/Behavioral:  Negative for hallucinations.     Physical Exam Updated Vital Signs BP (!) 149/87   Pulse (!) 109   Temp 98.6 F (37 C) (Oral)   Resp 13   SpO2 99%  Physical Exam Vitals and nursing note reviewed.  Constitutional:      Appearance: He is well-developed.  HENT:     Head: Normocephalic.     Nose: Nose normal.  Eyes:     General: No scleral icterus.    Conjunctiva/sclera: Conjunctivae normal.  Neck:     Thyroid: No thyromegaly.  Cardiovascular:     Rate and Rhythm: Normal rate and regular rhythm.     Heart sounds: No murmur heard.    No friction rub. No gallop.  Pulmonary:     Breath sounds: No stridor. No wheezing or rales.  Chest:     Chest wall: No tenderness.  Abdominal:     General: There is no distension.     Tenderness: There is no abdominal tenderness. There is no rebound.  Musculoskeletal:        General: Normal range of motion.     Cervical back: Neck supple.     Comments: Weakness left arm  Lymphadenopathy:     Cervical: No cervical adenopathy.  Skin:    Findings: No erythema or rash.  Neurological:     Mental Status: He is alert and oriented to person, place, and time.     Motor: No abnormal  muscle tone.     Coordination: Coordination normal.  Psychiatric:        Behavior: Behavior normal.     ED Results / Procedures / Treatments   Labs (all labs ordered are listed, but only abnormal results are displayed) Labs Reviewed  PROTIME-INR - Abnormal; Notable for the following components:      Result Value   Prothrombin Time 16.5 (*)    INR 1.3 (*)    All other components within normal limits  CBC - Abnormal; Notable for the following components:   RBC 3.73 (*)    HCT 36.8 (*)  MCH 34.9 (*)    RDW 15.7 (*)    Platelets 134 (*)    All other components within normal limits  COMPREHENSIVE METABOLIC PANEL - Abnormal; Notable for the following components:   Potassium 3.2 (*)    Glucose, Bld 119 (*)    Creatinine, Ser 1.43 (*)    Total Protein 6.1 (*)    Albumin 2.3 (*)    AST 75 (*)    ALT 47 (*)    Alkaline Phosphatase 129 (*)    Total Bilirubin 3.4 (*)    GFR, Estimated 54 (*)    All other components within normal limits  RAPID URINE DRUG SCREEN, HOSP PERFORMED - Abnormal; Notable for the following components:   Tetrahydrocannabinol POSITIVE (*)    All other components within normal limits  URINALYSIS, ROUTINE W REFLEX MICROSCOPIC - Abnormal; Notable for the following components:   Hgb urine dipstick SMALL (*)    All other components within normal limits  AMMONIA - Abnormal; Notable for the following components:   Ammonia 74 (*)    All other components within normal limits  POCT I-STAT, CHEM 8 - Abnormal; Notable for the following components:   Potassium 3.3 (*)    Creatinine, Ser 1.50 (*)    Glucose, Bld 114 (*)    Hemoglobin 12.2 (*)    HCT 36.0 (*)    All other components within normal limits  APTT  DIFFERENTIAL  ETHANOL  I-STAT CHEM 8, ED    EKG None  Radiology CT ANGIO HEAD NECK W WO CM W PERF (CODE STROKE) Result Date: 03/01/2023 CLINICAL DATA:  Left-sided weakness and facial droop. New onset confusion. EXAM: CT ANGIOGRAPHY HEAD AND NECK CT  PERFUSION BRAIN TECHNIQUE: Multidetector CT imaging of the head and neck was performed using the standard protocol during bolus administration of intravenous contrast. Multiplanar CT image reconstructions and MIPs were obtained to evaluate the vascular anatomy. Carotid stenosis measurements (when applicable) are obtained utilizing NASCET criteria, using the distal internal carotid diameter as the denominator. Multiphase CT imaging of the brain was performed following IV bolus contrast injection. Subsequent parametric perfusion maps were calculated using RAPID software. RADIATION DOSE REDUCTION: This exam was performed according to the departmental dose-optimization program which includes automated exposure control, adjustment of the mA and/or kV according to patient size and/or use of iterative reconstruction technique. CONTRAST:  OMNIPAQUE IOHEXOL 350 MG/ML SOLN COMPARISON:  CT head without contrast 03/01/2023 FINDINGS: CTA NECK FINDINGS Aortic arch: A 3 vessel arch configuration is present. No aneurysm or stenosis is present. No dissection is present. Right carotid system: Right common carotid artery is within normal limits. Minimal atherosclerotic changes are present at the bifurcation without significant stenosis. The cervical right ICA is otherwise normal. Left carotid system: Left common carotid artery is within normal limits. Minimal calcifications are present bifurcation without significant stenosis. The cervical left ICA is otherwise normal. Vertebral arteries: The left vertebral artery is slightly dominant to the right. Both vertebral arteries originate from the subclavian arteries without significant stenosis. No significant stenosis is present in either vertebral artery in the neck. Skeleton: Solid fusion is present at C5-6 and C6-7. Slight degenerative anterolisthesis is present at C3-4 and C4-5. No focal osseous lesions are present. The patient is edentulous. Other neck: Soft tissues the neck are  otherwise unremarkable. Salivary glands are within normal limits. Thyroid is normal. No significant adenopathy is present. No focal mucosal or submucosal lesions are present. Upper chest: The lung apices are clear. Thoracic inlet  is within normal limits. Review of the MIP images confirms the above findings CTA HEAD FINDINGS Anterior circulation: Internal carotid arteries are within normal limits from the skull base to the ICA termini. The A1 and M1 segments are normal. The anterior communicating artery is patent. MCA bifurcations are within normal limits. The ACA and MCA branch vessels are normal bilaterally. Posterior circulation: The left vertebral artery is dominant vessel. PICA origins are visualized and normal. The vertebrobasilar junction and basilar artery are normal. The superior cerebellar arteries are patent. Both posterior cerebral arteries originate basilar tip. PCA branch vessels are normal bilaterally. Venous sinuses: The dural sinuses are patent. The straight sinus and deep cerebral veins are intact. Cortical veins are within normal limits. No significant vascular malformation is evident. Anatomic variants: None Review of the MIP images confirms the above findings CT Brain Perfusion Findings: ASPECTS: 10/10 CBF (<30%) Volume: 0mL Perfusion (Tmax>6.0s) volume: 25mL is estimated in the anterior right frontal lobe and inferior right temporal lobe. This is likely artifactual as there is significant patient motion creating artifact across the lower images. Mismatch Volume: 0mL IMPRESSION: 1. Minimal atherosclerotic changes at the carotid bifurcations bilaterally without significant stenosis. 2. Normal variant CTA Circle of Willis without significant proximal stenosis, aneurysm, or branch vessel occlusion. 3. No significant stenosis or mismatch on CT perfusion. Estimated area of with delayed T-max is likely artifactual 4. Solid fusion at C5-6 and C6-7. 5. Slight degenerative anterolisthesis at C3-4 and  C4-5. The above was relayed via text pager to Dr. Thana Farr on 03/01/2023 at 13:31 . Electronically Signed   By: Marin Roberts M.D.   On: 03/01/2023 13:31   CT HEAD CODE STROKE WO CONTRAST Result Date: 03/01/2023 CLINICAL DATA:  Code stroke. Provided history: Neuro deficit, acute, stroke suspected. Left-sided weakness. Facial droop. New onset confusion. EXAM: CT HEAD WITHOUT CONTRAST TECHNIQUE: Contiguous axial images were obtained from the base of the skull through the vertex without intravenous contrast. RADIATION DOSE REDUCTION: This exam was performed according to the departmental dose-optimization program which includes automated exposure control, adjustment of the mA and/or kV according to patient size and/or use of iterative reconstruction technique. COMPARISON:  Head CT 03/24/2005. FINDINGS: Brain: Generalized cerebral atrophy. Patchy and ill-defined hypoattenuation within the cerebral white matter, nonspecific but compatible with moderate-to-advanced chronic small vessel ischemic disease. There is no acute intracranial hemorrhage. No demarcated cortical infarct. No extra-axial fluid collection. No evidence of an intracranial mass. No midline shift. Vascular: No hyperdense vessel. Skull: No calvarial fracture or aggressive osseous lesion. Sinuses/Orbits: No mass or acute finding within the imaged orbits. No significant paranasal sinus disease at the imaged levels. ASPECTS [facility] Stroke Program Early CT Score) - Ganglionic level infarction (caudate, lentiform nuclei, internal capsule, insula, M1-M3 cortex): 7 - Supraganglionic infarction (M4-M6 cortex): 3 Total score (0-10 with 10 being normal): 10 No evidence of an acute intracranial abnormality. These results were called by telephone at the time of interpretation on 03/01/2023 at 12:58 pm to provider [facility] Nansi Birmingham , who verbally acknowledged these results. IMPRESSION: 1.  No evidence of an acute intracranial abnormality. ASPECTS is 10. 2.  Parenchymal atrophy and chronic small vessel ischemic disease. Electronically Signed   By: Jackey Loge D.O.   On: 03/01/2023 12:59    Procedures Procedures    Medications Ordered in ED Medications  clopidogrel (PLAVIX) tablet 75 mg (has no administration in time range)  lactulose (CHRONULAC) 10 GM/15ML solution 30 g (has no administration in time range)  iohexol (OMNIPAQUE) 350 MG/ML  injection 100 mL (100 mLs Intravenous Contrast Given 03/01/23 1300)    ED Course/ Medical Decision Making/ A&P  CRITICAL CARE Performed by: Bethann Berkshire Total critical care time: 40 minutes Critical care time was exclusive of separately billable procedures and treating other patients. Critical care was necessary to treat or prevent imminent or life-threatening deterioration. Critical care was time spent personally by me on the following activities: development of treatment plan with patient and/or surrogate as well as nursing, discussions with consultants, evaluation of patient's response to treatment, examination of patient, obtaining history from patient or surrogate, ordering and performing treatments and interventions, ordering and review of laboratory studies, ordering and review of radiographic studies, pulse oximetry and re-evaluation of patient's condition.                                Medical Decision Making Amount and/or Complexity of Data Reviewed Labs: ordered. Radiology: ordered.  Risk Prescription drug management. Decision regarding hospitalization.  This patient presents to the ED for concern of slurred speech and weakness, this involves an extensive number of treatment options, and is a complaint that carries with it a high risk of complications and morbidity.  The differential diagnosis includes stroke, TIA, cirrhosis worsening   Co morbidities that complicate the patient evaluation  Cirrhosis and previous stroke   Additional history obtained:  Additional history obtained  from family External records from outside source obtained and reviewed including hospital records   Lab Tests:  I Ordered, and personally interpreted labs.  The pertinent results include: Ammonia level 74   Imaging Studies ordered:  I ordered imaging studies including CT head and CT angio I independently visualized and interpreted imaging which showed markable I agree with the radiologist interpretation   Cardiac Monitoring: / EKG:  The patient was maintained on a cardiac monitor.  I personally viewed and interpreted the cardiac monitored which showed an underlying rhythm of: Normal sinus rhythm   Consultations Obtained:  I requested consultation with the neurology and hospitalist,  and discussed lab and imaging findings as well as pertinent plan - they recommend: Admit to hospitalist, neurology saw patient as a code stroke and recommended MRI and treating cirrhosis   Problem List / ED Course / Critical interventions / Medication management  Cirrhosis and weakness I ordered medication including lactulose for cirrhosis Reevaluation of the patient after these medicines showed that the patient stayed the same I have reviewed the patients home medicines and have made adjustments as needed   Social Determinants of Health:  None   Test / Admission - Considered:  None  Altered mental status and TIA.  Patient will be admitted to medicine        Final Clinical Impression(s) / ED Diagnoses Final diagnoses:  None    Rx / DC Orders ED Discharge Orders     None         Bethann Berkshire, MD 03/03/23 [phone]

## 2023-03-01 NOTE — Assessment & Plan Note (Signed)
 Resume venlafaxine

## 2023-03-02 ENCOUNTER — Encounter (HOSPITAL_COMMUNITY): Payer: Self-pay | Admitting: Internal Medicine

## 2023-03-02 ENCOUNTER — Observation Stay (HOSPITAL_COMMUNITY): Payer: Medicare Other

## 2023-03-02 DIAGNOSIS — E1140 Type 2 diabetes mellitus with diabetic neuropathy, unspecified: Secondary | ICD-10-CM | POA: Diagnosis present

## 2023-03-02 DIAGNOSIS — Z8673 Personal history of transient ischemic attack (TIA), and cerebral infarction without residual deficits: Secondary | ICD-10-CM | POA: Diagnosis not present

## 2023-03-02 DIAGNOSIS — G8194 Hemiplegia, unspecified affecting left nondominant side: Secondary | ICD-10-CM | POA: Diagnosis present

## 2023-03-02 DIAGNOSIS — F909 Attention-deficit hyperactivity disorder, unspecified type: Secondary | ICD-10-CM | POA: Diagnosis present

## 2023-03-02 DIAGNOSIS — K7460 Unspecified cirrhosis of liver: Secondary | ICD-10-CM | POA: Diagnosis present

## 2023-03-02 DIAGNOSIS — Z803 Family history of malignant neoplasm of breast: Secondary | ICD-10-CM | POA: Diagnosis not present

## 2023-03-02 DIAGNOSIS — Z800 Family history of malignant neoplasm of digestive organs: Secondary | ICD-10-CM | POA: Diagnosis not present

## 2023-03-02 DIAGNOSIS — R29703 NIHSS score 3: Secondary | ICD-10-CM | POA: Diagnosis present

## 2023-03-02 DIAGNOSIS — R188 Other ascites: Secondary | ICD-10-CM | POA: Diagnosis present

## 2023-03-02 DIAGNOSIS — Z7984 Long term (current) use of oral hypoglycemic drugs: Secondary | ICD-10-CM | POA: Diagnosis not present

## 2023-03-02 DIAGNOSIS — G4733 Obstructive sleep apnea (adult) (pediatric): Secondary | ICD-10-CM | POA: Diagnosis present

## 2023-03-02 DIAGNOSIS — I639 Cerebral infarction, unspecified: Secondary | ICD-10-CM | POA: Diagnosis present

## 2023-03-02 DIAGNOSIS — I6389 Other cerebral infarction: Secondary | ICD-10-CM | POA: Diagnosis present

## 2023-03-02 DIAGNOSIS — Z7982 Long term (current) use of aspirin: Secondary | ICD-10-CM | POA: Diagnosis not present

## 2023-03-02 DIAGNOSIS — Z79899 Other long term (current) drug therapy: Secondary | ICD-10-CM | POA: Diagnosis not present

## 2023-03-02 DIAGNOSIS — R29810 Facial weakness: Secondary | ICD-10-CM | POA: Diagnosis present

## 2023-03-02 DIAGNOSIS — R4781 Slurred speech: Secondary | ICD-10-CM | POA: Diagnosis present

## 2023-03-02 DIAGNOSIS — E785 Hyperlipidemia, unspecified: Secondary | ICD-10-CM | POA: Diagnosis present

## 2023-03-02 DIAGNOSIS — Z6835 Body mass index (BMI) 35.0-35.9, adult: Secondary | ICD-10-CM | POA: Diagnosis not present

## 2023-03-02 DIAGNOSIS — K7682 Hepatic encephalopathy: Secondary | ICD-10-CM | POA: Diagnosis not present

## 2023-03-02 DIAGNOSIS — E66812 Obesity, class 2: Secondary | ICD-10-CM | POA: Diagnosis present

## 2023-03-02 DIAGNOSIS — Z8249 Family history of ischemic heart disease and other diseases of the circulatory system: Secondary | ICD-10-CM | POA: Diagnosis not present

## 2023-03-02 DIAGNOSIS — I10 Essential (primary) hypertension: Secondary | ICD-10-CM | POA: Diagnosis present

## 2023-03-02 DIAGNOSIS — E1169 Type 2 diabetes mellitus with other specified complication: Secondary | ICD-10-CM | POA: Diagnosis present

## 2023-03-02 DIAGNOSIS — F324 Major depressive disorder, single episode, in partial remission: Secondary | ICD-10-CM | POA: Diagnosis present

## 2023-03-02 DIAGNOSIS — G9341 Metabolic encephalopathy: Secondary | ICD-10-CM | POA: Diagnosis present

## 2023-03-02 LAB — LIPID PANEL
Cholesterol: 245 mg/dL — ABNORMAL HIGH (ref 0–200)
HDL: 39 mg/dL — ABNORMAL LOW (ref >40)
LDL Cholesterol: 175 mg/dL — ABNORMAL HIGH (ref 0–99)
Total CHOL/HDL Ratio: 6.3 {ratio}
Triglycerides: 154 mg/dL — ABNORMAL HIGH (ref <150)
VLDL: 31 mg/dL (ref 0–40)

## 2023-03-02 LAB — GLUCOSE, CAPILLARY
Glucose-Capillary: 102 mg/dL — ABNORMAL HIGH (ref 70–99)
Glucose-Capillary: 130 mg/dL — ABNORMAL HIGH (ref 70–99)

## 2023-03-02 LAB — AMMONIA: Ammonia: 94 umol/L — ABNORMAL HIGH (ref 9–35)

## 2023-03-02 MED ORDER — ATORVASTATIN CALCIUM 40 MG PO TABS
40.0000 mg | ORAL_TABLET | Freq: Every day | ORAL | Status: DC
Start: 2023-03-02 — End: 2023-03-02
  Administered 2023-03-02: 40 mg via ORAL
  Filled 2023-03-02: qty 1

## 2023-03-02 MED ORDER — CLOPIDOGREL BISULFATE 75 MG PO TABS
75.0000 mg | ORAL_TABLET | Freq: Every day | ORAL | Status: DC
  Administered 2023-03-02 – 2023-03-03 (×2): 75 mg via ORAL
  Filled 2023-03-02 (×2): qty 1

## 2023-03-02 MED ORDER — ATORVASTATIN CALCIUM 40 MG PO TABS
80.0000 mg | ORAL_TABLET | Freq: Every day | ORAL | Status: DC
  Administered 2023-03-03: 80 mg via ORAL
  Filled 2023-03-02: qty 2

## 2023-03-02 NOTE — Care Management Obs Status (Signed)
MEDICARE OBSERVATION STATUS NOTIFICATION   Patient Details  Name: Shawn [name]. MRN: [id number] Date of Birth: 1955-12-08   Medicare Observation Status Notification Given:  Yes    Villa Herb, Theresia Majors 03/02/2023, 11:49 AM

## 2023-03-02 NOTE — Plan of Care (Signed)
  Problem: Education: Goal: Knowledge of General Education information will improve Description Including pain rating scale, medication(s)/side effects and non-pharmacologic comfort measures Outcome: Progressing   Problem: Health Behavior/Discharge Planning: Goal: Ability to manage health-related needs will improve Outcome: Progressing   

## 2023-03-02 NOTE — Evaluation (Signed)
Occupational Therapy Evaluation Patient Details Name: Shawn [name]. MRN: [id number] DOB: 12-13-1955 Today's Date: 03/02/2023   History of Present Illness Shawn [name]. is a 67 y.o. male with medical history significant for liver cirrhosis, TIPS, SBP, diabetes mellitus, hypertension.  Patient was brought to the ED with reports of confusion.  At the time of my evaluation, patient is awake alert, able to answer some questions but speech is sometimes confused.  Spouse is at bedside and assist with the history.  She reports confusion started this morning, and patient not knowing what to do with his pills, or his walker, not talking much, she also reports he was unsteady on his feet.  She reports this is typical of when his ammonia is high.  Reports he has been compliant with his medications rifaximin and lactulose, with 3 bowel movements daily, he has not missed any doses recently.  No vomiting no diarrhea has had stable oral intake.  No cough, no difficulty breathing, no fevers no urinary symptoms.  Reports when EMS got there, patient was confused, from with poor patient effort they thought the patient had weakness to his left side and facial droop.  On arrival to the ED this was not evident. (per MD)   Clinical Impression   Pt agreeable to OT and PT co-evaluation. Pt is still a little "foggy" according to the pt's wife. Pt was able to follow commands but was difficult to understand at times. Good B UE strength. Tremors and some fine motor deficits likely at baseline. L visual field deficit at baseline. CGA to supervision for ambulation without AD. Pt is not recommended for further acute OT services and will be discharged to care of nursing staff for remaining length of stay.       If plan is discharge home, recommend the following: A little help with walking and/or transfers    Functional Status Assessment  Patient has not had a recent decline in their functional status  Equipment  Recommendations  None recommended by OT           Precautions / Restrictions Precautions Precautions: Fall Restrictions Weight Bearing Restrictions Per Provider Order: No      Mobility Bed Mobility Overal bed mobility: Modified Independent                  Transfers Overall transfer level: Needs assistance Equipment used: None Transfers: Sit to/from Stand, Bed to chair/wheelchair/BSC Sit to Stand: Contact guard assist, Supervision     Step pivot transfers: Supervision, Contact guard assist     General transfer comment: Mildly unsteady      Balance Overall balance assessment: Needs assistance Sitting-balance support: No upper extremity supported, Feet supported Sitting balance-Leahy Scale: Good Sitting balance - Comments: seated at EOB   Standing balance support: No upper extremity supported, During functional activity Standing balance-Leahy Scale: Good Standing balance comment: without AD; fair to good                           ADL either performed or assessed with clinical judgement   ADL Overall ADL's : Needs assistance/impaired     Grooming: Supervision/safety;Modified independent;Standing       Lower Body Bathing: Modified independent;Sitting/lateral leans       Lower Body Dressing: Modified independent;Sitting/lateral leans   Toilet Transfer: Supervision/safety;Contact guard assist;Ambulation Toilet Transfer Details (indicate cue type and reason): Simulated via ambulation in the hall and transfer to chair. Toileting- Architect  and Hygiene: Modified independent       Functional mobility during ADLs: Contact guard assist;Supervision/safety       Vision Baseline Vision/History: 1 Wears glasses (L visual field deficit from old stroke.) Ability to See in Adequate Light: 1 Impaired Patient Visual Report: No change from baseline Vision Assessment?: Yes Tracking/Visual Pursuits: Other (comment) (slow tracking at times  lagging behand the stiumulus.) Visual Fields: Left visual field deficit (at baseline)     Perception Perception: Not tested       Praxis Praxis: Not tested       Pertinent Vitals/Pain Pain Assessment Pain Assessment: No/denies pain     Extremity/Trunk Assessment Upper Extremity Assessment Upper Extremity Assessment: Overall WFL for tasks assessed   Lower Extremity Assessment Lower Extremity Assessment: Defer to PT evaluation   Cervical / Trunk Assessment Cervical / Trunk Assessment: Normal   Communication Communication Communication: Difficulty communicating thoughts/reduced clarity of speech Cueing Techniques: Verbal cues   Cognition Arousal: Alert Behavior During Therapy: WFL for tasks assessed/performed Overall Cognitive Status: Within Functional Limits for tasks assessed                                                        Home Living Family/patient expects to be discharged to:: Private residence Living Arrangements: Spouse/significant other Available Help at Discharge: Family;Available 24 hours/day Type of Home: House Home Access: Stairs to enter [employer] of Steps: 4-5 Entrance Stairs-Rails: Left Home Layout: One level     Bathroom Shower/Tub: Chief Strategy Officer: Standard Bathroom Accessibility: No   Home Equipment: Information systems manager          Prior Functioning/Environment Prior Level of Function : Independent/Modified Independent             Mobility Comments: Tourist information centre manager without AD ADLs Comments: Independent ADL's able to drive                                Co-evaluation PT/OT/SLP Co-Evaluation/Treatment: Yes Reason for Co-Treatment: To address functional/ADL transfers   OT goals addressed during session: ADL's and self-care      AM-PAC OT "6 Clicks" Daily Activity     Outcome Measure Help from another person eating meals?: None Help from another person taking care of  personal grooming?: None Help from another person toileting, which includes using toliet, bedpan, or urinal?: None Help from another person bathing (including washing, rinsing, drying)?: None Help from another person to put on and taking off regular upper body clothing?: None Help from another person to put on and taking off regular lower body clothing?: None 6 Click Score: 24   End of Session Equipment Utilized During Treatment: Gait belt  Activity Tolerance: Patient tolerated treatment well Patient left: in chair;with call bell/phone within reach;with family/visitor present  OT Visit Diagnosis: Unsteadiness on feet (R26.81);Other abnormalities of gait and mobility (R26.89);Other symptoms and signs involving cognitive function                Time: 5366-4403 OT Time Calculation (min): 25 min Charges:  OT General Charges $OT Visit: 1 Visit OT Evaluation $OT Eval Low Complexity: 1 Low  Lavaris Sexson OT, MOT  Danie Chandler 03/02/2023, 9:29 AM

## 2023-03-02 NOTE — Plan of Care (Signed)
  Problem: Acute Rehab PT Goals(only PT should resolve) Goal: Pt Will Go Supine/Side To Sit Outcome: Progressing Flowsheets (Taken 03/02/2023 1400) Pt will go Supine/Side to Sit: with modified independence Goal: Patient Will Transfer Sit To/From Stand Outcome: Progressing Flowsheets (Taken 03/02/2023 1400) Patient will transfer sit to/from stand:  with modified independence  with supervision Goal: Pt Will Transfer Bed To Chair/Chair To Bed Outcome: Progressing Flowsheets (Taken 03/02/2023 1400) Pt will Transfer Bed to Chair/Chair to Bed:  with modified independence  with supervision Goal: Pt Will Ambulate Outcome: Progressing Flowsheets (Taken 03/02/2023 1400) Pt will Ambulate:  > 125 feet  with modified independence  with supervision  with least restrictive assistive device   2:00 PM, 03/02/23 Ocie Bob, MPT Physical Therapist with [facility] [phone] office [phone] mobile phone

## 2023-03-02 NOTE — Plan of Care (Signed)
  Problem: Education: Goal: Knowledge of General Education information will improve Description: Including pain rating scale, medication(s)/side effects and non-pharmacologic comfort measures Outcome: Progressing   Problem: Coping: Goal: Level of anxiety will decrease Outcome: Progressing   

## 2023-03-02 NOTE — Progress Notes (Signed)
PROGRESS NOTE    Shawn [name].  YQM:[id number] DOB: [birth year] DOA: 03/01/2023 PCP: Babs Sciara, MD   Brief Narrative:    Shawn [name]. is a 67 y.o. male with medical history significant for liver cirrhosis, TIPS, SBP, diabetes mellitus, hypertension. Patient was brought to the ED with reports of confusion.  He was admitted with suspicion of acute metabolic encephalopathy, but is now noted to have acute ischemic CVA and further workup is still pending with 2D echocardiogram.  Assessment & Plan:   Principal Problem:   Acute metabolic encephalopathy Active Problems:   Essential hypertension, benign   Hyperlipidemia associated with type 2 diabetes mellitus (HCC)   Obstructive sleep apnea   Elevated LFTs   Cirrhosis of liver with ascites (HCC)   Depression, major, single episode, in partial remission (HCC)   S/P TIPS (transjugular intrahepatic portosystemic shunt)  Assessment and Plan:  Acute acute encephalopathy with acute ischemic CVA Presenting with confusion.  Likely hepatic in etiology.  Compliance with rifaximin and lactulose.  Initially reported left-sided weakness and facial droop reported by EMS, presents on brought to the ED although on my evaluation.  Spouse also reports she didn't notice these, but it was because patient was confused and patient was not fully cooperative.  Head CT CTA head and neck negative for acute abnormality or significant stenosis. -Ammonia level still elevated, but patient returning to baseline level of mentation -Brain MRI does demonstrate acute ischemic CVA -DAPT for 3 weeks followed by Plavix daily per neurology -Started on atorvastatin 80 mg daily for LDL elevation -2D echocardiogram pending  Depression, major, single episode, in partial remission (HCC) Resume venlafaxine  Cirrhosis of liver with ascites (HCC) Likely in hepatic encephalopathy.  Chronic elevation in liver enzymes, AST 75, ALT 47, ALP 129, T. Bili - 3.4.  INR  1.3. -Hold spironolactone and torsemide for now, hydrate gently  Hyperlipidemia associated with type 2 diabetes mellitus (HCC) Controlled.  A1c 4.6. Tight control. -Hold metformin  Essential hypertension, benign Stable. -Hold torsemide and spironolactone for now, gently hydrate  Obesity, class II -BMI 35.15  DVT prophylaxis:Lovenox Code Status: Full Family Communication: Daughter at bedside 12/26 Disposition Plan: CVA workup Status is: Observation The patient will require care spanning > 2 midnights and should be moved to inpatient because: Need for further inpatient testing   Consultants:  Neurology  Procedures:  None  Antimicrobials:  None   Subjective: Patient seen and evaluated today with no new acute complaints or concerns. No acute concerns or events noted overnight.  Mentation appears to be improving.  Objective: Vitals:   03/02/23 0046 03/02/23 0454 03/02/23 1000 03/02/23 1521  BP: (!) 152/87 (!) 140/82 128/80 (!) 145/74  Pulse: 97 88 80 82  Resp: 18 16    Temp: 98.2 F (36.8 C) 97.7 F (36.5 C) 98 F (36.7 C) 98.8 F (37.1 C)  TempSrc:   Oral Oral  SpO2: 98% 98% 98% 100%  Weight:      Height:        Intake/Output Summary (Last 24 hours) at 03/02/2023 1555 Last data filed at 03/02/2023 1255 Gross per 24 hour  Intake 820.49 ml  Output 500 ml  Net 320.49 ml   Filed Weights   03/01/23 1755  Weight: 95.8 kg    Examination:  General exam: Appears calm and comfortable  Respiratory system: Clear to auscultation. Respiratory effort normal. Cardiovascular system: S1 & S2 heard, RRR.  Gastrointestinal system: Abdomen is soft Central nervous system: Alert and  awake Extremities: No edema Skin: No significant lesions noted Psychiatry: Flat affect.    Data Reviewed: I have personally reviewed following labs and imaging studies  CBC: Recent Labs  Lab 03/01/23 1244  WBC 6.6  NEUTROABS 4.2  HGB 13.0  12.2*  HCT 36.8*  36.0*  MCV 98.7   PLT 134*   Basic Metabolic Panel: Recent Labs  Lab 03/01/23 1244  NA 142  144  K 3.2*  3.3*  CL 110  110  CO2 23  GLUCOSE 119*  114*  BUN 17  16  CREATININE 1.43*  1.50*  CALCIUM 9.2   GFR: Estimated Creatinine Clearance: 54.8 mL/min (A) (by C-G formula based on SCr of 1.43 mg/dL (H)). Liver Function Tests: Recent Labs  Lab 03/01/23 1244  AST 75*  ALT 47*  ALKPHOS 129*  BILITOT 3.4*  PROT 6.1*  ALBUMIN 2.3*   No results for input(s): "LIPASE", "AMYLASE" in the last 168 hours. Recent Labs  Lab 03/01/23 1411 03/02/23 0717  AMMONIA 74* 94*   Coagulation Profile: Recent Labs  Lab 03/01/23 1244  INR 1.3*   Cardiac Enzymes: No results for input(s): "CKTOTAL", "CKMB", "CKMBINDEX", "TROPONINI" in the last 168 hours. BNP (last 3 results) No results for input(s): "PROBNP" in the last 8760 hours. HbA1C: No results for input(s): "HGBA1C" in the last 72 hours. CBG: Recent Labs  Lab 03/01/23 1237  GLUCAP 102*   Lipid Profile: Recent Labs    03/02/23 0437  CHOL 245*  HDL 39*  LDLCALC 175*  TRIG 154*  CHOLHDL 6.3   Thyroid Function Tests: No results for input(s): "TSH", "T4TOTAL", "FREET4", "T3FREE", "THYROIDAB" in the last 72 hours. Anemia Panel: No results for input(s): "VITAMINB12", "FOLATE", "FERRITIN", "TIBC", "IRON", "RETICCTPCT" in the last 72 hours. Sepsis Labs: No results for input(s): "PROCALCITON", "LATICACIDVEN" in the last 168 hours.  No results found for this or any previous visit (from the past 240 hours).       Radiology Studies: MR BRAIN WO CONTRAST Result Date: 03/02/2023 CLINICAL DATA:  Provided history: Mental status change, unknown cause. EXAM: MRI HEAD WITHOUT CONTRAST TECHNIQUE: Multiplanar, multiecho pulse sequences of the brain and surrounding structures were obtained without intravenous contrast. COMPARISON:  Non-contrast head CT and CT angiogram head/neck 03/01/2023. FINDINGS: Brain: Mild generalized cerebral atrophy. 7  mm acute infarct within the right aspect of the ventral pons (series 5, image 16). Multifocal T2 FLAIR hyperintense signal abnormality within the cerebral white matter, nonspecific but compatible with advanced chronic small vessel ischemic disease. Small T2 hyperintense focus within the left thalamus favored to reflect a prominent perivascular space. No evidence of an intracranial mass. No chronic intracranial blood products. No extra-axial fluid collection. No midline shift. Vascular: Maintained flow voids within the proximal large arterial vessels. Skull and upper cervical spine: No focal worrisome marrow lesion. Slight C3-C4 grade 1 anterolisthesis. Incompletely assessed cervical spondylosis. Sinuses/Orbits: No mass or acute finding within the imaged orbits. Minimal mucosal thickening within the right maxillary sinus. 2 cm mucous retention cyst within the left maxillary sinus. Other: Small Tornwaldt cyst. Impression #1 will be called to the ordering clinician or representative by the Radiologist Assistant, and communication documented in the PACS or [employer]. IMPRESSION: 1. 7 mm acute infarct within the right aspect of the ventral pons. 2. Advanced chronic small vessel ischemic changes within the cerebral white matter. 3. Mild generalized cerebral atrophy. 4. Paranasal sinus disease as described. Electronically Signed   By: Jackey Loge D.O.   On: 03/02/2023 11:35  CT ANGIO HEAD NECK W WO CM W PERF (CODE STROKE) Result Date: 03/01/2023 CLINICAL DATA:  Left-sided weakness and facial droop. New onset confusion. EXAM: CT ANGIOGRAPHY HEAD AND NECK CT PERFUSION BRAIN TECHNIQUE: Multidetector CT imaging of the head and neck was performed using the standard protocol during bolus administration of intravenous contrast. Multiplanar CT image reconstructions and MIPs were obtained to evaluate the vascular anatomy. Carotid stenosis measurements (when applicable) are obtained utilizing NASCET criteria, using the  distal internal carotid diameter as the denominator. Multiphase CT imaging of the brain was performed following IV bolus contrast injection. Subsequent parametric perfusion maps were calculated using RAPID software. RADIATION DOSE REDUCTION: This exam was performed according to the departmental dose-optimization program which includes automated exposure control, adjustment of the mA and/or kV according to patient size and/or use of iterative reconstruction technique. CONTRAST:  OMNIPAQUE IOHEXOL 350 MG/ML SOLN COMPARISON:  CT head without contrast 03/01/2023 FINDINGS: CTA NECK FINDINGS Aortic arch: A 3 vessel arch configuration is present. No aneurysm or stenosis is present. No dissection is present. Right carotid system: Right common carotid artery is within normal limits. Minimal atherosclerotic changes are present at the bifurcation without significant stenosis. The cervical right ICA is otherwise normal. Left carotid system: Left common carotid artery is within normal limits. Minimal calcifications are present bifurcation without significant stenosis. The cervical left ICA is otherwise normal. Vertebral arteries: The left vertebral artery is slightly dominant to the right. Both vertebral arteries originate from the subclavian arteries without significant stenosis. No significant stenosis is present in either vertebral artery in the neck. Skeleton: Solid fusion is present at C5-6 and C6-7. Slight degenerative anterolisthesis is present at C3-4 and C4-5. No focal osseous lesions are present. The patient is edentulous. Other neck: Soft tissues the neck are otherwise unremarkable. Salivary glands are within normal limits. Thyroid is normal. No significant adenopathy is present. No focal mucosal or submucosal lesions are present. Upper chest: The lung apices are clear. Thoracic inlet is within normal limits. Review of the MIP images confirms the above findings CTA HEAD FINDINGS Anterior circulation: Internal  carotid arteries are within normal limits from the skull base to the ICA termini. The A1 and M1 segments are normal. The anterior communicating artery is patent. MCA bifurcations are within normal limits. The ACA and MCA branch vessels are normal bilaterally. Posterior circulation: The left vertebral artery is dominant vessel. PICA origins are visualized and normal. The vertebrobasilar junction and basilar artery are normal. The superior cerebellar arteries are patent. Both posterior cerebral arteries originate basilar tip. PCA branch vessels are normal bilaterally. Venous sinuses: The dural sinuses are patent. The straight sinus and deep cerebral veins are intact. Cortical veins are within normal limits. No significant vascular malformation is evident. Anatomic variants: None Review of the MIP images confirms the above findings CT Brain Perfusion Findings: ASPECTS: 10/10 CBF (<30%) Volume: 0mL Perfusion (Tmax>6.0s) volume: 25mL is estimated in the anterior right frontal lobe and inferior right temporal lobe. This is likely artifactual as there is significant patient motion creating artifact across the lower images. Mismatch Volume: 0mL IMPRESSION: 1. Minimal atherosclerotic changes at the carotid bifurcations bilaterally without significant stenosis. 2. Normal variant CTA Circle of Willis without significant proximal stenosis, aneurysm, or branch vessel occlusion. 3. No significant stenosis or mismatch on CT perfusion. Estimated area of with delayed T-max is likely artifactual 4. Solid fusion at C5-6 and C6-7. 5. Slight degenerative anterolisthesis at C3-4 and C4-5. The above was relayed via text pager to  Dr. Thana Farr on 03/01/2023 at 13:31 . Electronically Signed   By: Marin Roberts M.D.   On: 03/01/2023 13:31   CT HEAD CODE STROKE WO CONTRAST Result Date: 03/01/2023 CLINICAL DATA:  Code stroke. Provided history: Neuro deficit, acute, stroke suspected. Left-sided weakness. Facial droop. New onset  confusion. EXAM: CT HEAD WITHOUT CONTRAST TECHNIQUE: Contiguous axial images were obtained from the base of the skull through the vertex without intravenous contrast. RADIATION DOSE REDUCTION: This exam was performed according to the departmental dose-optimization program which includes automated exposure control, adjustment of the mA and/or kV according to patient size and/or use of iterative reconstruction technique. COMPARISON:  Head CT 03/24/2005. FINDINGS: Brain: Generalized cerebral atrophy. Patchy and ill-defined hypoattenuation within the cerebral white matter, nonspecific but compatible with moderate-to-advanced chronic small vessel ischemic disease. There is no acute intracranial hemorrhage. No demarcated cortical infarct. No extra-axial fluid collection. No evidence of an intracranial mass. No midline shift. Vascular: No hyperdense vessel. Skull: No calvarial fracture or aggressive osseous lesion. Sinuses/Orbits: No mass or acute finding within the imaged orbits. No significant paranasal sinus disease at the imaged levels. ASPECTS [facility] Stroke Program Early CT Score) - Ganglionic level infarction (caudate, lentiform nuclei, internal capsule, insula, M1-M3 cortex): 7 - Supraganglionic infarction (M4-M6 cortex): 3 Total score (0-10 with 10 being normal): 10 No evidence of an acute intracranial abnormality. These results were called by telephone at the time of interpretation on 03/01/2023 at 12:58 pm to provider [facility] ZAMMIT , who verbally acknowledged these results. IMPRESSION: 1.  No evidence of an acute intracranial abnormality. ASPECTS is 10. 2. Parenchymal atrophy and chronic small vessel ischemic disease. Electronically Signed   By: Jackey Loge D.O.   On: 03/01/2023 12:59        Scheduled Meds:  aspirin EC  81 mg Oral Daily   [START ON 03/03/2023] atorvastatin  80 mg Oral Daily   clopidogrel  75 mg Oral Daily   dorzolamide  1 drop Both Eyes BID   enoxaparin (LOVENOX) injection  40 mg  Subcutaneous Q24H   lactulose  30 g Oral TID   pantoprazole  40 mg Oral Daily   rifaximin  550 mg Oral BID   venlafaxine XR  150 mg Oral BID     LOS: 0 days    Time spent: 55 minutes    Seena Face Hoover Brunette, DO [facility]  If 7PM-7AM, please contact night-coverage [email or web address] 03/02/2023, 3:55 PM

## 2023-03-02 NOTE — Care Plan (Signed)
MRI brain wo contrast 03/02/2023 reviewed:  7 mm acute infarct within the right aspect of the ventral pons. Advanced chronic small vessel ischemic changes within the cerebral white matter. Mild generalized cerebral atrophy.      Acute ischemic stroke  Etiology: likely small vessel va cardioembolic Agree with Dr Thad Ranger recommendations  ASA 81mg  daily and plavix 75mg  daily for 3 weeks followed by plavix 75mg  daily ( was on ASA 81mg  daily prior to arrival) TTE pending, if no thrombus, PFO, recommend 30 day cardiac monitor to look for A fib. Atorva 80mg  daily for HLD Rest per Dr Thad Ranger recommendations F/I with [facility] in 2-3 months ( order placed) Discussed plan with Dr Sherryll Burger via secure chat

## 2023-03-02 NOTE — Evaluation (Signed)
Physical Therapy Evaluation Shawn [name] Details Name: Shawn [name]. MRN: [id number] DOB: 1955/10/03 Today's Date: 03/02/2023  History of Present Illness  Shawn [name]. is a 67 y.o. male with medical history significant for liver cirrhosis, TIPS, SBP, diabetes mellitus, hypertension.  Shawn [name] was brought to the ED with reports of confusion.  At the time of my evaluation, Shawn [name] is awake alert, able to answer some questions but speech is sometimes confused.  Spouse is at bedside and assist with the history.  She reports confusion started this morning, and Shawn [name] not knowing what to do with his pills, or his walker, not talking much, she also reports he was unsteady on his feet.  She reports this is typical of when his ammonia is high.  Reports he has been compliant with his medications rifaximin and lactulose, with 3 bowel movements daily, he has not missed any doses recently.  No vomiting no diarrhea has had stable oral intake.  No cough, no difficulty breathing, no fevers no urinary symptoms.  Reports when EMS got there, Shawn [name] was confused, from with poor Shawn [name] effort they thought the Shawn [name] had weakness to his left side and facial droop.  On arrival to the ED this was not evident.   Clinical Impression  Shawn [name] required [facility] raised for completing supine to sitting, once seated presented with BUE/LE tremors which is baseline per Shawn [name] and his spouse.  Shawn [name] able in room/hallway without loss of balance or need for an AD and limited for gait training mostly due to fatigue.  Shawn [name] tolerated sitting up in chair after therapy with his spouse present.  Shawn [name] will benefit from continued skilled physical therapy in hospital and recommended venue below to increase strength, balance, endurance for safe ADLs and gait.        If plan is discharge home, recommend the following: A little help with walking and/or transfers;A little help with bathing/dressing/bathroom;Help with stairs or ramp for  entrance;Assistance with cooking/housework   Can travel by private vehicle        Equipment Recommendations None recommended by PT  Recommendations for Other Services       Functional Status Assessment Shawn [name] has had a recent decline in their functional status and demonstrates the ability to make significant improvements in function in a reasonable and predictable amount of time.     Precautions / Restrictions Precautions Precautions: Fall Restrictions Weight Bearing Restrictions Per Provider Order: No      Mobility  Bed Mobility Overal bed mobility: Modified Independent                  Transfers Overall transfer level: Needs assistance Equipment used: None Transfers: Sit to/from Stand, Bed to chair/wheelchair/BSC Sit to Stand: Contact guard assist, Supervision   Step pivot transfers: Supervision, Contact guard assist       General transfer comment: slightly labored movement with baseline tremors in extremities    Ambulation/Gait Ambulation/Gait assistance: Supervision, Contact guard assist Gait Distance (Feet): 100 Feet Assistive device: None Gait Pattern/deviations: Decreased step length - right, Decreased step length - left, Decreased stride length Gait velocity: decreased     General Gait Details: slightly labored movement with requiring occasional hand held assist, no loss of balance and limited mostly due to fatigue  Stairs            Wheelchair Mobility     Tilt Bed    Modified Rankin (Stroke Patients Only)       Balance Overall balance assessment: Needs assistance Sitting-balance  support: Feet supported, No upper extremity supported Sitting balance-Leahy Scale: Good Sitting balance - Comments: seated at EOB   Standing balance support: During functional activity, No upper extremity supported Standing balance-Leahy Scale: Fair Standing balance comment: fair/good without AD                             Pertinent  Vitals/Pain Pain Assessment Pain Assessment: No/denies pain    Home Living Family/Shawn [name] expects to be discharged to:: Private residence Living Arrangements: Spouse/significant other Available Help at Discharge: Family;Available 24 hours/day Type of Home: House Home Access: Stairs to enter Entrance Stairs-Rails: Left Entrance Stairs-Number of Steps: 4-5   Home Layout: One level Home Equipment: Shower seat      Prior Function Prior Level of Function : Independent/Modified Independent;Driving             Mobility Comments: Tourist information centre manager without AD ADLs Comments: Independent ADL's able to drive     Extremity/Trunk Assessment   Upper Extremity Assessment Upper Extremity Assessment: Defer to OT evaluation    Lower Extremity Assessment Lower Extremity Assessment: Generalized weakness    Cervical / Trunk Assessment Cervical / Trunk Assessment: Normal  Communication   Communication Communication: Difficulty communicating thoughts/reduced clarity of speech Cueing Techniques: Verbal cues  Cognition Arousal: Alert Behavior During Therapy: WFL for tasks assessed/performed Overall Cognitive Status: Within Functional Limits for tasks assessed                                          General Comments      Exercises     Assessment/Plan    PT Assessment Shawn [name] needs continued PT services  PT Problem List         PT Treatment Interventions DME instruction;Stair training;Functional mobility training;Therapeutic activities;Gait training;Therapeutic exercise;Balance training;Shawn [name]/family education    PT Goals (Current goals can be found in the Care Plan section)  Acute Rehab PT Goals Shawn [name] Stated Goal: return home with family to assist PT Goal Formulation: With Shawn [name]/family Time For Goal Achievement: 03/06/23 Potential to Achieve Goals: Good    Frequency       Co-evaluation PT/OT/SLP Co-Evaluation/Treatment: Yes Reason for  Co-Treatment: To address functional/ADL transfers PT goals addressed during session: Mobility/safety with mobility;Balance         AM-PAC PT "6 Clicks" Mobility  Outcome Measure Help needed turning from your back to your side while in a flat bed without using bedrails?: None Help needed moving from lying on your back to sitting on the side of a flat bed without using bedrails?: A Little (HOB rasied) Help needed moving to and from a bed to a chair (including a wheelchair)?: A Little Help needed standing up from a chair using your arms (e.g., wheelchair or bedside chair)?: A Little Help needed to walk in hospital room?: A Little Help needed climbing 3-5 steps with a railing? : A Lot 6 Click Score: 18    End of Session Equipment Utilized During Treatment: Gait belt Activity Tolerance: Shawn [name] tolerated treatment well;Shawn [name] limited by fatigue Shawn [name] left: in chair;with call bell/phone within reach;with family/visitor present Nurse Communication: Mobility status PT Visit Diagnosis: Unsteadiness on feet (R26.81);Other abnormalities of gait and mobility (R26.89);Muscle weakness (generalized) (M62.81)    Time: 1610-9604 PT Time Calculation (min) (ACUTE ONLY): 28 min   Charges:   PT Evaluation $PT Eval Moderate Complexity: 1 Mod PT  Treatments $Therapeutic Activity: 23-37 mins PT General Charges $$ ACUTE PT VISIT: 1 Visit         1:58 PM, 03/02/23 Ocie Bob, MPT Physical Therapist with [facility] [phone] office [phone] mobile phone

## 2023-03-02 NOTE — TOC Initial Note (Signed)
Transition of Care (TOC) - Initial/Assessment Note    Patient Details  Name: Shawn [name]. MRN: [id number] Date of Birth: 1955/09/20  Transition of Care [facility]) CM/SW Contact:    Villa Herb, LCSWA Phone Number: 03/02/2023, 11:57 AM  Clinical Narrative:                 CSW updated that PT is recommending HH PT for pt at D/C. CSW spoke with pts wife to complete assessment. Pt is from home and independent at baseline. Pt does not use any DME in the home. Pts wife states they are agreeable to [facility] referral and do not have an agency preference. CSW to reach out to local [facility] agency's to see who can accept referral. TOC to follow.   Expected Discharge Plan: Home w Home Health Services Barriers to Discharge: Continued Medical Work up   Patient Goals and CMS Choice Patient states their goals for this hospitalization and ongoing recovery are:: return home CMS Medicare.gov Compare Post Acute Care list provided to:: Patient Represenative (must comment) Choice offered to / list presented to : Spouse      Expected Discharge Plan and Services In-house Referral: Clinical Social Work Discharge Planning Services: CM Consult Post Acute Care Choice: Home Health Living arrangements for the past 2 months: Single Family Home                                      Prior Living Arrangements/Services Living arrangements for the past 2 months: Single Family Home Lives with:: Spouse Patient language and need for interpreter reviewed:: Yes Do you feel safe going back to the place where you live?: Yes      Need for Family Participation in Patient Care: Yes (Comment) Care giver support system in place?: Yes (comment)   Criminal Activity/Legal Involvement Pertinent to Current Situation/Hospitalization: No - Comment as needed  Activities of Daily Living   ADL Screening (condition at time of admission) Independently performs ADLs?: No Does the patient have a NEW difficulty with  bathing/dressing/toileting/self-feeding that is expected to last >3 days?: Yes (Initiates electronic notice to provider for possible OT consult) Does the patient have a NEW difficulty with getting in/out of bed, walking, or climbing stairs that is expected to last >3 days?: Yes (Initiates electronic notice to provider for possible PT consult) Does the patient have a NEW difficulty with communication that is expected to last >3 days?: No Is the patient deaf or have difficulty hearing?: No Does the patient have difficulty seeing, even when wearing glasses/contacts?: No Does the patient have difficulty concentrating, remembering, or making decisions?: Yes  Permission Sought/Granted                  Emotional Assessment Appearance:: Appears stated age Attitude/Demeanor/Rapport: Engaged Affect (typically observed): Accepting   Alcohol / Substance Use: Not Applicable Psych Involvement: No (comment)  Admission diagnosis:  Hepatic encephalopathy (HCC) [K76.82] Left-sided weakness [R53.1] Patient Active Problem List   Diagnosis Date Noted   Acute metabolic encephalopathy 03/01/2023   SBP (spontaneous bacterial peritonitis) (HCC) 05/24/2022   Positive culture finding 05/24/2022   Anemia 05/04/2022   Depression, major, single episode, moderate (HCC) 04/14/2022   Thrombocytopenia (HCC) 04/14/2022   S/P TIPS (transjugular intrahepatic portosystemic shunt) 02/14/2022   Depression, major, single episode, in partial remission (HCC) 11/01/2021   Cirrhosis of liver with ascites (HCC)    FH: colon cancer  GAD (generalized anxiety disorder) 12/02/2019   Ascites 05/29/2019   GERD (gastroesophageal reflux disease) 06/15/2017   Neck pain 12/30/2015   Morbid obesity (HCC) 08/13/2015   Cognitive dysfunction 03/19/2015   Cirrhosis, non-alcoholic (HCC) 02/12/2015   Elevated LFTs 12/11/2014   Abnormal abdominal ultrasound 12/11/2014   ADD (attention deficit disorder) 05/02/2014   Fatty liver  12/19/2013   Type II diabetes mellitus, uncontrolled 12/19/2013   Fatigue 11/19/2013   History of cardiac catheterization 11/19/2013   Hyperlipidemia associated with type 2 diabetes mellitus (HCC) 09/12/2012   Obstructive sleep apnea 09/12/2012   Essential hypertension, benign 01/13/2012   Type 2 diabetes mellitus with stage 3b chronic kidney disease, without long-term current use of insulin (HCC) 01/13/2012   PCP:  Babs Sciara, MD Pharmacy:   [phone] [place], [place] - [address] AT [facility] [address] [place] [address] Phone: [phone] Fax: [phone]  [facility] - [place], [place] - [facility] AT Portal to Registered [facility] Sites [address] [place] [address] Phone: [phone] Fax: [phone]  [facility] [address], [place] - [address] [place] [address] [place] [address] [place] [address] Phone: [phone] Fax: [phone]     Social Drivers of Health (SDOH) Social History: SDOH Screenings   Food Insecurity: No Food Insecurity (03/01/2023)  Housing: Low Risk  (03/01/2023)  Transportation Needs: No Transportation Needs (03/01/2023)  Recent Concern: Transportation Needs - Unmet Transportation Needs (02/10/2023)  Utilities: Not At Risk (03/01/2023)  Alcohol Screen: Low Risk  (02/10/2023)  Depression (PHQ2-9): Low Risk  (02/10/2023)  Financial Resource Strain: Patient Declined (02/10/2023)  Physical Activity: Patient Declined (02/10/2023)  Social Connections: Moderately Isolated (02/10/2023)  Stress: Stress Concern Present (02/10/2023)  Tobacco Use: Unknown (03/01/2023)  Recent Concern: Tobacco Use - Medium Risk (02/24/2023)   Received from [facility]  Health Literacy: Adequate Health Literacy (02/10/2023)   SDOH Interventions:     Readmission Risk Interventions     No data to display

## 2023-03-03 ENCOUNTER — Telehealth: Payer: Self-pay | Admitting: *Deleted

## 2023-03-03 ENCOUNTER — Inpatient Hospital Stay (HOSPITAL_COMMUNITY): Payer: Medicare Other

## 2023-03-03 DIAGNOSIS — I639 Cerebral infarction, unspecified: Secondary | ICD-10-CM

## 2023-03-03 DIAGNOSIS — I6389 Other cerebral infarction: Secondary | ICD-10-CM | POA: Diagnosis not present

## 2023-03-03 DIAGNOSIS — G9341 Metabolic encephalopathy: Secondary | ICD-10-CM | POA: Diagnosis not present

## 2023-03-03 LAB — MAGNESIUM: Magnesium: 2.3 mg/dL (ref 1.7–2.4)

## 2023-03-03 LAB — COMPREHENSIVE METABOLIC PANEL
ALT: 40 U/L (ref 0–44)
AST: 67 U/L — ABNORMAL HIGH (ref 15–41)
Albumin: 1.8 g/dL — ABNORMAL LOW (ref 3.5–5.0)
Alkaline Phosphatase: 104 U/L (ref 38–126)
Anion gap: 4 — ABNORMAL LOW (ref 5–15)
BUN: 18 mg/dL (ref 8–23)
CO2: 24 mmol/L (ref 22–32)
Calcium: 8.5 mg/dL — ABNORMAL LOW (ref 8.9–10.3)
Chloride: 112 mmol/L — ABNORMAL HIGH (ref 98–111)
Creatinine, Ser: 1.28 mg/dL — ABNORMAL HIGH (ref 0.61–1.24)
GFR, Estimated: >60 mL/min (ref >60)
Glucose, Bld: 100 mg/dL — ABNORMAL HIGH (ref 70–99)
Potassium: 3.2 mmol/L — ABNORMAL LOW (ref 3.5–5.1)
Sodium: 140 mmol/L (ref 135–145)
Total Bilirubin: 2.6 mg/dL — ABNORMAL HIGH (ref <1.2)
Total Protein: 4.7 g/dL — ABNORMAL LOW (ref 6.5–8.1)

## 2023-03-03 LAB — CBC
HCT: 30.9 % — ABNORMAL LOW (ref 39.0–52.0)
Hemoglobin: 10.4 g/dL — ABNORMAL LOW (ref 13.0–17.0)
MCH: 34.3 pg — ABNORMAL HIGH (ref 26.0–34.0)
MCHC: 33.7 g/dL (ref 30.0–36.0)
MCV: 102 fL — ABNORMAL HIGH (ref 80.0–100.0)
Platelets: 113 10*3/uL — ABNORMAL LOW (ref 150–400)
RBC: 3.03 MIL/uL — ABNORMAL LOW (ref 4.22–5.81)
RDW: 15.6 % — ABNORMAL HIGH (ref 11.5–15.5)
WBC: 5.5 10*3/uL (ref 4.0–10.5)
nRBC: 0 % (ref 0.0–0.2)

## 2023-03-03 LAB — ECHOCARDIOGRAM COMPLETE
AR max vel: 3.22 cm2
AV Area VTI: 3.45 cm2
AV Area mean vel: 3.32 cm2
AV Mean grad: 9 mm[Hg]
AV Peak grad: 15.7 mm[Hg]
Ao pk vel: 1.98 m/s
Area-P 1/2: 4.06 cm2
Calc EF: 77.2 %
Height: 65 in
S' Lateral: 2.6 cm
Single Plane A2C EF: 74.2 %
Single Plane A4C EF: 79 %
Weight: 3379.21 [oz_av]

## 2023-03-03 LAB — AMMONIA: Ammonia: 103 umol/L — ABNORMAL HIGH (ref 9–35)

## 2023-03-03 MED ORDER — ASPIRIN 81 MG PO TBEC: 81.0000 mg | DELAYED_RELEASE_TABLET | Freq: Every day | ORAL | 0 refills | Status: DC

## 2023-03-03 MED ORDER — ATORVASTATIN CALCIUM 80 MG PO TABS: 80.0000 mg | ORAL_TABLET | Freq: Every day | ORAL | 2 refills | Status: DC

## 2023-03-03 MED ORDER — PERFLUTREN LIPID MICROSPHERE
1.0000 mL | INTRAVENOUS | Status: AC | PRN
  Administered 2023-03-03: 1 mL via INTRAVENOUS

## 2023-03-03 MED ORDER — POTASSIUM CHLORIDE CRYS ER 20 MEQ PO TBCR
40.0000 meq | EXTENDED_RELEASE_TABLET | Freq: Two times a day (BID) | ORAL | Status: DC
  Administered 2023-03-03: 40 meq via ORAL
  Filled 2023-03-03: qty 2

## 2023-03-03 MED ORDER — CLOPIDOGREL BISULFATE 75 MG PO TABS: 75.0000 mg | ORAL_TABLET | Freq: Every day | ORAL | 2 refills | Status: DC

## 2023-03-03 NOTE — Progress Notes (Signed)
Pt is improving w/NIH Assessment. Although he presents with some bilateral hand tremors, pt was able to independently utilize bathroom on his own, ambulate from chair to bed independently. Pt does have difficulty with slurred speech.No motor issues, limb ataxia or sensory mishaps. NIH Assessments will continue to monitor pt progress per hospital protocol.

## 2023-03-03 NOTE — Progress Notes (Signed)
Patient discharged with instructions given on medications, and follow up visits,patient and family verbalized understanding. Prescriptions sent to Pharmacy of choice documented on AVS. IV discontinued,catheter intact. Accompanied by staff to an awaiting vehicle.

## 2023-03-03 NOTE — Discharge Summary (Signed)
Physician Discharge Summary  Shawn [name]. ZOX:[id number] DOB: 17-Nov-1955 DOA: 03/01/2023  PCP: Babs Sciara, MD  Admit date: 03/01/2023  Discharge date: 03/03/2023  Admitted From:Home  Disposition:  Home  Recommendations for Outpatient Follow-up:  Follow up with PCP in 1-2 weeks Follow-up with [facility] with referral sent Remain and DAPT for 3 weeks and then Plavix only thereafter Remain on atorvastatin as prescribed Continue other home medications as prior Cardiac monitoring for 30 days  Home Health: Yes with PT  Equipment/Devices: None  Discharge Condition:Stable  CODE STATUS: Full  Diet recommendation: Heart Healthy/carb modified  Brief/Interim Summary: Shawn [name]. is a 67 y.o. male with medical history significant for liver cirrhosis, TIPS, SBP, diabetes mellitus, hypertension. Patient was brought to the ED with reports of confusion.  He was admitted with suspicion of acute metabolic encephalopathy, but is now noted to have acute ischemic CVA and 2D echocardiogram is with preserved LVEF and no other acute findings.  He will need 30-day cardiac monitor and will remain on DAPT for 3 weeks followed by Plavix only.  He has been seen by PT with recommendations for home health services.  No other acute events or concerns noted.  Discharge Diagnoses:  Principal Problem:   Acute metabolic encephalopathy Active Problems:   Essential hypertension, benign   Hyperlipidemia associated with type 2 diabetes mellitus (HCC)   Obstructive sleep apnea   Elevated LFTs   Cirrhosis of liver with ascites (HCC)   Depression, major, single episode, in partial remission (HCC)   S/P TIPS (transjugular intrahepatic portosystemic shunt)   CVA (cerebral vascular accident) [facility])  Principal discharge diagnosis: Acute encephalopathy secondary to acute ischemic CVA.  Discharge Instructions  Discharge Instructions     Ambulatory referral to Neurology    Complete by: As directed    An appointment is requested in approximately: 8 -12 weeks   Diet - low sodium heart healthy   Complete by: As directed    Increase activity slowly   Complete by: As directed       Allergies as of 03/03/2023   No Known Allergies      Medication List     TAKE these medications    ascorbic Acid 500 MG Cpcr Commonly known as: VITAMIN C Take 500 mg by mouth daily.   aspirin EC 81 MG tablet Take 1 tablet (81 mg total) by mouth daily for 21 days. Swallow whole. Start taking on: March 04, 2023   atorvastatin 80 MG tablet Commonly known as: LIPITOR Take 1 tablet (80 mg total) by mouth daily. Start taking on: March 04, 2023   Blood Glucose Monitoring Suppl Devi 1 each by Does not apply route in the morning, at noon, and at bedtime. May substitute to any manufacturer covered by patient's insurance Dx E11.22   BLOOD GLUCOSE TEST STRIPS Strp Use to check blood sugar once daily before meals. May substitute to any manufacturer covered by patient's insurance E11.22   clopidogrel 75 MG tablet Commonly known as: PLAVIX Take 1 tablet (75 mg total) by mouth daily. Start taking on: March 04, 2023   Copper Gluconate 2 MG Tabs TAKE 2 AND 1/2 TABLETS (5 MG TOTAL) BY MOUTH 2 (TWO) TIMES DAILY FOR 7 DAYS, THEN 2 AND 1/2 TABLETS (5 MG TOTAL) DAILY.   D3 VITAMIN PO Take by mouth daily. 5000 IU daily   dorzolamide 2 % ophthalmic solution Commonly known as: TRUSOPT Place 1 drop into both eyes 2 (two) times daily.  fluticasone 50 MCG/ACT nasal spray Commonly known as: FLONASE SPRAY 2 SPRAYS INTO EACH NOSTRIL EVERY DAY What changed: See the new instructions.   lactulose 10 GM/15ML solution Commonly known as: CHRONULAC Take 30 mLs (20 g total) by mouth 3 (three) times daily.   Lancet Device Misc Use to check blood sugar once daily before meals. May substitute to any manufacturer covered by patient's insurance E11.22   Lancets Misc. Misc Use to  check blood sugar once daily before meals. May substitute to any manufacturer covered by patient's insurance   metFORMIN 500 MG tablet Commonly known as: GLUCOPHAGE TAKE 1 TABLET BY MOUTH EVERY DAY   OVER THE COUNTER MEDICATION Take 2 tablets by mouth daily. Pure Encapsulation Copper   pantoprazole 40 MG tablet Commonly known as: PROTONIX TAKE 1 TABLET BY MOUTH EVERY DAY   rifaximin 550 MG Tabs tablet Commonly known as: XIFAXAN Take 1 tablet (550 mg total) by mouth 2 (two) times daily.   spironolactone 25 MG tablet Commonly known as: ALDACTONE Take 25 mg by mouth daily.   torsemide 10 MG tablet Commonly known as: DEMADEX Take 10 mg by mouth daily. For two weeks   venlafaxine XR 150 MG 24 hr capsule Commonly known as: EFFEXOR-XR TAKE 1 CAPSULE BY MOUTH TWICE A DAY        Follow-up Information     [facility] Follow up.   Specialty: Home Health Services Why: Agency will call to set up home visits. Contact information: [address] [place] [address] [phone]         Babs Sciara, MD. Schedule an appointment as soon as possible for a visit in 1 week(s).   Specialty: Family Medicine Contact information: [address] [place] [address] [phone]         [facility]. Go to.   Specialty: Neurology Contact information: [address] [place] [address] [phone]               No Known Allergies  Consultations: Neurology   Procedures/Studies: ECHOCARDIOGRAM COMPLETE Result Date: 03/03/2023    ECHOCARDIOGRAM REPORT   Patient Name:   Shawn [name]. Date of Exam: 03/03/2023 Medical Rec #:  [id number]          Height:       65.0 in Accession #:    [id number]         Weight:       211.2 lb Date of Birth:  [birth year]          BSA:          2.024 m Patient Age:    67 years           BP:           145/74 mmHg Patient Gender: M                   HR:           80 bpm. [facility] Procedure: 2D Echo, Cardiac Doppler, Color Doppler and Intracardiac            Opacification Agent Indications:    Stroke  History:        Patient has prior history of Echocardiogram examinations, most                 recent 01/05/2022. TIA and Stroke; Risk Factors:Hypertension.  Sonographer:    Webb Laws  Referring Phys: [id number] Lamont Dowdy [facility] IMPRESSIONS  1. Left ventricular ejection fraction, by estimation, is 65 to 70%. The left ventricle has normal function. The left ventricle has no regional wall motion abnormalities. There is moderate concentric left ventricular hypertrophy. Left ventricular diastolic parameters are indeterminate.  2. Right ventricular systolic function is normal. The right ventricular size is normal.  3. The mitral valve is normal in structure. Mild mitral valve regurgitation.  4. The aortic valve is tricuspid. Aortic valve regurgitation is not visualized. Aortic valve sclerosis/calcification is present, without any evidence of aortic stenosis.  5. Aortic dilatation noted. There is mild dilatation of the ascending aorta, measuring 39 mm.  6. The inferior vena cava is normal in size with greater than 50% respiratory variability, suggesting right atrial pressure of 3 mmHg. Comparison(s): The left ventricular function is unchanged. FINDINGS  Left Ventricle: Left ventricular ejection fraction, by estimation, is 65 to 70%. The left ventricle has normal function. The left ventricle has no regional wall motion abnormalities. The left ventricular internal cavity size was normal in size. There is  moderate concentric left ventricular hypertrophy. Left ventricular diastolic parameters are indeterminate. Right Ventricle: The right ventricular size is normal. Right vetricular wall thickness was not assessed. Right ventricular systolic function is normal. Left Atrium: Left atrial size was normal in size. Right Atrium: Right atrial size was normal in  size. Pericardium: There is no evidence of pericardial effusion. Mitral Valve: The mitral valve is normal in structure. Mild mitral valve regurgitation. Tricuspid Valve: The tricuspid valve is normal in structure. Tricuspid valve regurgitation is trivial. Aortic Valve: The aortic valve is tricuspid. Aortic valve regurgitation is not visualized. Aortic valve sclerosis/calcification is present, without any evidence of aortic stenosis. Aortic valve mean gradient measures 9.0 mmHg. Aortic valve peak gradient measures 15.7 mmHg. Aortic valve area, by VTI measures 3.45 cm. Pulmonic Valve: The pulmonic valve was not well visualized. Pulmonic valve regurgitation is not visualized. Aorta: The aortic root is normal in size and structure and aortic dilatation noted. There is mild dilatation of the ascending aorta, measuring 39 mm. Venous: The inferior vena cava is normal in size with greater than 50% respiratory variability, suggesting right atrial pressure of 3 mmHg. IAS/Shunts: No atrial level shunt detected by color flow Doppler.  LEFT VENTRICLE PLAX 2D LVIDd:         4.70 cm      Diastology LVIDs:         2.60 cm      LV e' medial:    6.42 cm/s LV PW:         1.50 cm      LV E/e' medial:  17.4 LV IVS:        1.40 cm      LV e' lateral:   8.81 cm/s LVOT diam:     2.10 cm      LV E/e' lateral: 12.7 LV SV:         144 LV SV Index:   71 LVOT Area:     3.46 cm  LV Volumes (MOD) LV vol d, MOD A2C: 139.0 ml LV vol d, MOD A4C: 162.0 ml LV vol s, MOD A2C: 35.8 ml LV vol s, MOD A4C: 34.1 ml LV SV MOD A2C:     103.2 ml LV SV MOD A4C:     162.0 ml LV SV MOD BP:      117.4 ml RIGHT VENTRICLE  IVC RV Basal diam:  2.70 cm     IVC diam: 1.80 cm RV S prime:     21.90 cm/s TAPSE (M-mode): 2.6 cm LEFT ATRIUM             Index        RIGHT ATRIUM           Index LA diam:        4.40 cm 2.17 cm/m   RA Area:     10.20 cm LA Vol (A2C):   78.7 ml 38.87 ml/m  RA Volume:   13.80 ml  6.82 ml/m LA Vol (A4C):   51.4 ml 25.39 ml/m LA  Biplane Vol: 65.9 ml 32.55 ml/m  AORTIC VALVE AV Area (Vmax):    3.22 cm AV Area (Vmean):   3.32 cm AV Area (VTI):     3.45 cm AV Vmax:           198.00 cm/s AV Vmean:          144.000 cm/s AV VTI:            0.419 m AV Peak Grad:      15.7 mmHg AV Mean Grad:      9.0 mmHg LVOT Vmax:         184.00 cm/s LVOT Vmean:        138.000 cm/s LVOT VTI:          0.417 m LVOT/AV VTI ratio: 1.00  AORTA Ao Root diam: 3.40 cm Ao Asc diam:  3.90 cm MITRAL VALVE MV Area (PHT): 4.06 cm     SHUNTS MV Decel Time: 187 msec     Systemic VTI:  0.42 m MV E velocity: 112.00 cm/s  Systemic Diam: 2.10 cm MV A velocity: 152.00 cm/s MV E/A ratio:  0.74 Dietrich Pates MD Electronically signed by Dietrich Pates MD Signature Date/Time: 03/03/2023/2:10:40 PM    Final    MR BRAIN WO CONTRAST Result Date: 03/02/2023 CLINICAL DATA:  Provided history: Mental status change, unknown cause. EXAM: MRI HEAD WITHOUT CONTRAST TECHNIQUE: Multiplanar, multiecho pulse sequences of the brain and surrounding structures were obtained without intravenous contrast. COMPARISON:  Non-contrast head CT and CT angiogram head/neck 03/01/2023. FINDINGS: Brain: Mild generalized cerebral atrophy. 7 mm acute infarct within the right aspect of the ventral pons (series 5, image 16). Multifocal T2 FLAIR hyperintense signal abnormality within the cerebral white matter, nonspecific but compatible with advanced chronic small vessel ischemic disease. Small T2 hyperintense focus within the left thalamus favored to reflect a prominent perivascular space. No evidence of an intracranial mass. No chronic intracranial blood products. No extra-axial fluid collection. No midline shift. Vascular: Maintained flow voids within the proximal large arterial vessels. Skull and upper cervical spine: No focal worrisome marrow lesion. Slight C3-C4 grade 1 anterolisthesis. Incompletely assessed cervical spondylosis. Sinuses/Orbits: No mass or acute finding within the imaged orbits. Minimal mucosal  thickening within the right maxillary sinus. 2 cm mucous retention cyst within the left maxillary sinus. Other: Small Tornwaldt cyst. Impression #1 will be called to the ordering clinician or representative by the Radiologist Assistant, and communication documented in the PACS or [employer]. IMPRESSION: 1. 7 mm acute infarct within the right aspect of the ventral pons. 2. Advanced chronic small vessel ischemic changes within the cerebral white matter. 3. Mild generalized cerebral atrophy. 4. Paranasal sinus disease as described. Electronically Signed   By: Jackey Loge D.O.   On: 03/02/2023 11:35   CT ANGIO HEAD NECK W WO CM  W PERF (CODE STROKE) Result Date: 03/01/2023 CLINICAL DATA:  Left-sided weakness and facial droop. New onset confusion. EXAM: CT ANGIOGRAPHY HEAD AND NECK CT PERFUSION BRAIN TECHNIQUE: Multidetector CT imaging of the head and neck was performed using the standard protocol during bolus administration of intravenous contrast. Multiplanar CT image reconstructions and MIPs were obtained to evaluate the vascular anatomy. Carotid stenosis measurements (when applicable) are obtained utilizing NASCET criteria, using the distal internal carotid diameter as the denominator. Multiphase CT imaging of the brain was performed following IV bolus contrast injection. Subsequent parametric perfusion maps were calculated using RAPID software. RADIATION DOSE REDUCTION: This exam was performed according to the departmental dose-optimization program which includes automated exposure control, adjustment of the mA and/or kV according to patient size and/or use of iterative reconstruction technique. CONTRAST:  OMNIPAQUE IOHEXOL 350 MG/ML SOLN COMPARISON:  CT head without contrast 03/01/2023 FINDINGS: CTA NECK FINDINGS Aortic arch: A 3 vessel arch configuration is present. No aneurysm or stenosis is present. No dissection is present. Right carotid system: Right common carotid artery is within normal  limits. Minimal atherosclerotic changes are present at the bifurcation without significant stenosis. The cervical right ICA is otherwise normal. Left carotid system: Left common carotid artery is within normal limits. Minimal calcifications are present bifurcation without significant stenosis. The cervical left ICA is otherwise normal. Vertebral arteries: The left vertebral artery is slightly dominant to the right. Both vertebral arteries originate from the subclavian arteries without significant stenosis. No significant stenosis is present in either vertebral artery in the neck. Skeleton: Solid fusion is present at C5-6 and C6-7. Slight degenerative anterolisthesis is present at C3-4 and C4-5. No focal osseous lesions are present. The patient is edentulous. Other neck: Soft tissues the neck are otherwise unremarkable. Salivary glands are within normal limits. Thyroid is normal. No significant adenopathy is present. No focal mucosal or submucosal lesions are present. Upper chest: The lung apices are clear. Thoracic inlet is within normal limits. Review of the MIP images confirms the above findings CTA HEAD FINDINGS Anterior circulation: Internal carotid arteries are within normal limits from the skull base to the ICA termini. The A1 and M1 segments are normal. The anterior communicating artery is patent. MCA bifurcations are within normal limits. The ACA and MCA branch vessels are normal bilaterally. Posterior circulation: The left vertebral artery is dominant vessel. PICA origins are visualized and normal. The vertebrobasilar junction and basilar artery are normal. The superior cerebellar arteries are patent. Both posterior cerebral arteries originate basilar tip. PCA branch vessels are normal bilaterally. Venous sinuses: The dural sinuses are patent. The straight sinus and deep cerebral veins are intact. Cortical veins are within normal limits. No significant vascular malformation is evident. Anatomic variants:  None Review of the MIP images confirms the above findings CT Brain Perfusion Findings: ASPECTS: 10/10 CBF (<30%) Volume: 0mL Perfusion (Tmax>6.0s) volume: 25mL is estimated in the anterior right frontal lobe and inferior right temporal lobe. This is likely artifactual as there is significant patient motion creating artifact across the lower images. Mismatch Volume: 0mL IMPRESSION: 1. Minimal atherosclerotic changes at the carotid bifurcations bilaterally without significant stenosis. 2. Normal variant CTA Circle of Willis without significant proximal stenosis, aneurysm, or branch vessel occlusion. 3. No significant stenosis or mismatch on CT perfusion. Estimated area of with delayed T-max is likely artifactual 4. Solid fusion at C5-6 and C6-7. 5. Slight degenerative anterolisthesis at C3-4 and C4-5. The above was relayed via text pager to Dr. Thana Farr on 03/01/2023 at 13:31 .  Electronically Signed   By: Marin Roberts M.D.   On: 03/01/2023 13:31   CT HEAD CODE STROKE WO CONTRAST Result Date: 03/01/2023 CLINICAL DATA:  Code stroke. Provided history: Neuro deficit, acute, stroke suspected. Left-sided weakness. Facial droop. New onset confusion. EXAM: CT HEAD WITHOUT CONTRAST TECHNIQUE: Contiguous axial images were obtained from the base of the skull through the vertex without intravenous contrast. RADIATION DOSE REDUCTION: This exam was performed according to the departmental dose-optimization program which includes automated exposure control, adjustment of the mA and/or kV according to patient size and/or use of iterative reconstruction technique. COMPARISON:  Head CT 03/24/2005. FINDINGS: Brain: Generalized cerebral atrophy. Patchy and ill-defined hypoattenuation within the cerebral white matter, nonspecific but compatible with moderate-to-advanced chronic small vessel ischemic disease. There is no acute intracranial hemorrhage. No demarcated cortical infarct. No extra-axial fluid collection. No  evidence of an intracranial mass. No midline shift. Vascular: No hyperdense vessel. Skull: No calvarial fracture or aggressive osseous lesion. Sinuses/Orbits: No mass or acute finding within the imaged orbits. No significant paranasal sinus disease at the imaged levels. ASPECTS [facility] Stroke Program Early CT Score) - Ganglionic level infarction (caudate, lentiform nuclei, internal capsule, insula, M1-M3 cortex): 7 - Supraganglionic infarction (M4-M6 cortex): 3 Total score (0-10 with 10 being normal): 10 No evidence of an acute intracranial abnormality. These results were called by telephone at the time of interpretation on 03/01/2023 at 12:58 pm to provider [facility] ZAMMIT , who verbally acknowledged these results. IMPRESSION: 1.  No evidence of an acute intracranial abnormality. ASPECTS is 10. 2. Parenchymal atrophy and chronic small vessel ischemic disease. Electronically Signed   By: Jackey Loge D.O.   On: 03/01/2023 12:59   IR Radiologist Eval & Mgmt Result Date: 02/16/2023 EXAM: ESTABLISHED PATIENT OFFICE VISIT CHIEF COMPLAINT: See Epic note. HISTORY OF PRESENT ILLNESS: See Epic note. REVIEW OF SYSTEMS: See Epic note. PHYSICAL EXAMINATION: See Epic note. ASSESSMENT AND PLAN: See Epic note. Marliss Coots, MD [facility]iology Electronically Signed   By: Marliss Coots M.D.   On: 02/16/2023 08:11     Discharge Exam: Vitals:   03/03/23 0600 03/03/23 1124  BP: 136/80 (!) 153/85  Pulse: 83 78  Resp: 14   Temp: 98.9 F (37.2 C) 98.4 F (36.9 C)  SpO2: 100% 100%   Vitals:   03/02/23 2200 03/03/23 0420 03/03/23 0600 03/03/23 1124  BP: (!) 143/78 132/75 136/80 (!) 153/85  Pulse: 90 87 83 78  Resp: 17 18 14    Temp: 98.7 F (37.1 C) 97.7 F (36.5 C) 98.9 F (37.2 C) 98.4 F (36.9 C)  TempSrc: Oral  Oral Oral  SpO2: 100% 100% 100% 100%  Weight:      Height:        General: Pt is alert, awake, not in acute distress Cardiovascular: RRR,  S1/S2 +, no rubs, no gallops Respiratory: CTA bilaterally, no wheezing, no rhonchi Abdominal: Soft, NT, ND, bowel sounds + Extremities: no edema, no cyanosis    The results of significant diagnostics from this hospitalization (including imaging, microbiology, ancillary and laboratory) are listed below for reference.     Microbiology: No results found for this or any previous visit (from the past 240 hours).   Labs: BNP (last 3 results) No results for input(s): "BNP" in the last 8760 hours. Basic Metabolic Panel: Recent Labs  Lab 03/01/23 1244 03/03/23 0417  NA 142  144 140  K 3.2*  3.3* 3.2*  CL 110  110 112*  CO2  23 24  GLUCOSE 119*  114* 100*  BUN 17  16 18   CREATININE 1.43*  1.50* 1.28*  CALCIUM 9.2 8.5*  MG  --  2.3   Liver Function Tests: Recent Labs  Lab 03/01/23 1244 03/03/23 0417  AST 75* 67*  ALT 47* 40  ALKPHOS 129* 104  BILITOT 3.4* 2.6*  PROT 6.1* 4.7*  ALBUMIN 2.3* 1.8*   No results for input(s): "LIPASE", "AMYLASE" in the last 168 hours. Recent Labs  Lab 03/01/23 1411 03/02/23 0717 03/03/23 0417  AMMONIA 74* 94* 103*   CBC: Recent Labs  Lab 03/01/23 1244 03/03/23 0417  WBC 6.6 5.5  NEUTROABS 4.2  --   HGB 13.0  12.2* 10.4*  HCT 36.8*  36.0* 30.9*  MCV 98.7 102.0*  PLT 134* 113*   Cardiac Enzymes: No results for input(s): "CKTOTAL", "CKMB", "CKMBINDEX", "TROPONINI" in the last 168 hours. BNP: Invalid input(s): "POCBNP" CBG: Recent Labs  Lab 03/01/23 1237 03/02/23 2014  GLUCAP 102* 130*   D-Dimer No results for input(s): "DDIMER" in the last 72 hours. Hgb A1c No results for input(s): "HGBA1C" in the last 72 hours. Lipid Profile Recent Labs    03/02/23 0437  CHOL 245*  HDL 39*  LDLCALC 175*  TRIG 154*  CHOLHDL 6.3   Thyroid function studies No results for input(s): "TSH", "T4TOTAL", "T3FREE", "THYROIDAB" in the last 72 hours.  Invalid input(s): "FREET3" Anemia work up No results for input(s): "VITAMINB12",  "FOLATE", "FERRITIN", "TIBC", "IRON", "RETICCTPCT" in the last 72 hours. Urinalysis    Component Value Date/Time   COLORURINE YELLOW 03/01/2023 1329   APPEARANCEUR CLEAR 03/01/2023 1329   LABSPEC 1.013 03/01/2023 1329   PHURINE 6.0 03/01/2023 1329   GLUCOSEU NEGATIVE 03/01/2023 1329   HGBUR SMALL (A) 03/01/2023 1329   BILIRUBINUR NEGATIVE 03/01/2023 1329   KETONESUR NEGATIVE 03/01/2023 1329   PROTEINUR NEGATIVE 03/01/2023 1329   NITRITE NEGATIVE 03/01/2023 1329   LEUKOCYTESUR NEGATIVE 03/01/2023 1329   Sepsis Labs Recent Labs  Lab 03/01/23 1244 03/03/23 0417  WBC 6.6 5.5   Microbiology No results found for this or any previous visit (from the past 240 hours).   Time coordinating discharge: 35 minutes  SIGNED:   Erick Blinks, DO [facility] 03/03/2023, 3:03 PM  If 7PM-7AM, please contact night-coverage [email or web address]

## 2023-03-03 NOTE — Plan of Care (Signed)
  Problem: Health Behavior/Discharge Planning: Goal: Ability to manage health-related needs will improve Outcome: Progressing   Problem: Clinical Measurements: Goal: Ability to maintain clinical measurements within normal limits will improve Outcome: Progressing Goal: Will remain free from infection Outcome: Progressing Goal: Diagnostic test results will improve Outcome: Progressing   Problem: Education: Goal: Knowledge of disease or condition will improve Outcome: Progressing Goal: Knowledge of secondary prevention will improve (MUST DOCUMENT ALL) Outcome: Progressing Goal: Knowledge of patient specific risk factors will improve Loraine Leriche N/A or DELETE if not current risk factor) Outcome: Progressing   Problem: Ischemic Stroke/TIA Tissue Perfusion: Goal: Complications of ischemic stroke/TIA will be minimized Outcome: Progressing   Problem: Coping: Goal: Will identify appropriate support needs Outcome: Progressing   Problem: Health Behavior/Discharge Planning: Goal: Ability to manage health-related needs will improve Outcome: Progressing Goal: Goals will be collaboratively established with patient/family Outcome: Progressing   Problem: Self-Care: Goal: Ability to communicate needs accurately will improve Outcome: Progressing

## 2023-03-03 NOTE — TOC Transition Note (Signed)
Transition of Care [facility]) - Discharge Note   Patient Details  Name: Shawn [name]. MRN: [id number] Date of Birth: 19-Oct-1955  Transition of Care [facility]) CM/SW Contact:  Villa Herb, LCSWA Phone Number: 03/03/2023, 1:20 PM   Clinical Narrative:    CSW spoke to Cindie with [facility] who states they will accept [facility] PT referral. MD placed [facility] PT orders. HH agency info added to AVS for review. TOC signing off.   Final next level of care: Home w Home Health Services Barriers to Discharge: Barriers Resolved   Patient Goals and CMS Choice Patient states their goals for this hospitalization and ongoing recovery are:: return home CMS Medicare.gov Compare Post Acute Care list provided to:: Patient Represenative (must comment) Choice offered to / list presented to : Spouse      Discharge Placement                       Discharge Plan and Services Additional resources added to the After Visit Summary for   In-house Referral: Clinical Social Work Discharge Planning Services: CM Consult Post Acute Care Choice: Home Health                    HH Arranged: PT [facility] Agency: [facility] Date [facility] Agency Contacted: 03/03/23   Representative spoke with at [facility] Agency: Cindie  Social Drivers of Health (SDOH) Interventions SDOH Screenings   Food Insecurity: No Food Insecurity (03/01/2023)  Housing: Low Risk  (03/01/2023)  Transportation Needs: No Transportation Needs (03/01/2023)  Recent Concern: Transportation Needs - Unmet Transportation Needs (02/10/2023)  Utilities: Not At Risk (03/01/2023)  Alcohol Screen: Low Risk  (02/10/2023)  Depression (PHQ2-9): Low Risk  (02/10/2023)  Financial Resource Strain: Patient Declined (02/10/2023)  Physical Activity: Patient Declined (02/10/2023)  Social Connections: Moderately Isolated (02/10/2023)  Stress: Stress Concern Present (02/10/2023)  Tobacco Use: Unknown (03/01/2023)  Recent Concern: Tobacco Use - Medium Risk (02/24/2023)    Received from [facility]  Health Literacy: Adequate Health Literacy (02/10/2023)     Readmission Risk Interventions    03/03/2023    1:19 PM  Readmission Risk Prevention Plan  Transportation Screening Complete  Home Care Screening Complete  Medication Review (RN CM) Complete

## 2023-03-03 NOTE — Telephone Encounter (Signed)
Thirty day monitor requested by Dr. Sherryll Burger for CVA. Pt enrolled in [employer] and order placed.

## 2023-03-06 ENCOUNTER — Telehealth: Payer: Self-pay | Admitting: Gastroenterology

## 2023-03-06 ENCOUNTER — Encounter: Payer: Self-pay | Admitting: Gastroenterology

## 2023-03-06 ENCOUNTER — Ambulatory Visit (INDEPENDENT_AMBULATORY_CARE_PROVIDER_SITE_OTHER): Payer: Medicare Other | Admitting: Gastroenterology

## 2023-03-06 VITALS — BP 134/75 | HR 85 | Temp 98.2°F | Ht 65.0 in | Wt 217.6 lb

## 2023-03-06 DIAGNOSIS — K7682 Hepatic encephalopathy: Secondary | ICD-10-CM | POA: Diagnosis not present

## 2023-03-06 DIAGNOSIS — K7581 Nonalcoholic steatohepatitis (NASH): Secondary | ICD-10-CM

## 2023-03-06 DIAGNOSIS — D689 Coagulation defect, unspecified: Secondary | ICD-10-CM

## 2023-03-06 DIAGNOSIS — D696 Thrombocytopenia, unspecified: Secondary | ICD-10-CM

## 2023-03-06 DIAGNOSIS — Z1211 Encounter for screening for malignant neoplasm of colon: Secondary | ICD-10-CM

## 2023-03-06 DIAGNOSIS — Z8673 Personal history of transient ischemic attack (TIA), and cerebral infarction without residual deficits: Secondary | ICD-10-CM

## 2023-03-06 DIAGNOSIS — R188 Other ascites: Secondary | ICD-10-CM | POA: Diagnosis not present

## 2023-03-06 DIAGNOSIS — F1091 Alcohol use, unspecified, in remission: Secondary | ICD-10-CM

## 2023-03-06 DIAGNOSIS — R600 Localized edema: Secondary | ICD-10-CM

## 2023-03-06 DIAGNOSIS — K7460 Unspecified cirrhosis of liver: Secondary | ICD-10-CM

## 2023-03-06 DIAGNOSIS — Z800 Family history of malignant neoplasm of digestive organs: Secondary | ICD-10-CM | POA: Diagnosis not present

## 2023-03-06 DIAGNOSIS — D649 Anemia, unspecified: Secondary | ICD-10-CM

## 2023-03-06 NOTE — Telephone Encounter (Signed)
Tammy, we need to cancel patient's EGD and colonoscopy for now due to recent acute CVA.

## 2023-03-06 NOTE — Progress Notes (Signed)
Referring Provider: Babs Sciara, MD Primary Care Physician:  Babs Sciara, MD Primary GI Physician: Dr. Jena Gauss  Chief Complaint  Patient presents with   Hospitalization Follow-up    Pt was admitted on Christmas day and discharged on Friday.  Pt is scheduled for colonoscopy on 03/22/23. His spouse says he is doing a 3 day prep and he can't do that because he is a diabetic.    HPI:   Shawn [name]. is a 67 y.o. male with GI history of GERD, decompensated NASH +/- alcohol cirrhosis (abstinent since December 2013).  Cirrhosis complicated by recurrent ascites/LE edema s/p TIPS December 2023.  Continued requiring frequent paras thereafter and developed renal insufficiency and thus has been intolerant to diuretics.  Underwent TIPS and balloon expansion 05/09/2022. Prior concern for SBP in March 2024, but it was felt to be a contaminant, so no recommendations for SBP prophylaxis.  History of elevated AFP, but no HCC on MRI. He has established with with Annamarie Major at [facility] liver clinic in [place] and is currently undergoing transplant evaluation.    He is presenting today for acute visit with concern for mental status change/elevated ammonia following recent hospital admission for acute ischemic CVA.   Patient was admitted 03/01/2023 - 03/03/2023 after presenting with reports of confusion and suspicion for acute metabolic encephalopathy, but was noted to have acute ischemic CVA.  2D echocardiogram with preserved LVEF and no acute findings.  Recommended 30-day cardiac monitor and would remain on DAPT x 3 weeks followed by Plavix only.     Today:  Wife reports when she ask Shawn [name] something this morning, he just seemed a little "foggy", but he said there was nothing wrong.  However, she was concerned, so she called and made an appointment.  States his current symptoms are nothing like they were when he presented with a stroke.  States when he was having his stroke, he "could not make  sense of anything" and was also very unsteady on his feet.  Shawn [name] tells me that he feels like he is pretty close to his baseline but he does seem to be having some slower speech/taking time to respond today.  Shawn [name]' wife states this was also present at the time of hospital discharge and Dr. Sherryll Burger told them this was normal following the stroke.   He is taking Lactulose 30 ml TID and Xifaxan 550 mg BID. Having 3-4 bowel movements daily. No brbpr or melena.   In regards to his stroke workup, they have not heard anything yet about the 30-day cardiac monitor.    EGD: Never. This has been on hold in the setting of significant ascites undergoing TIPS/balloon expansion. We have been trying to get this scheduled since April 2024. Currently scheduled in January, but will have to postpone due to recent stroke.   Ascites/peripheral edema:  Last paracentesis 10/05/22.   Abdominal distention is at baseline.  Continues with lower extremity edema.  Also noted some swelling in his arms during his hospitalization.  States he did receive IV fluids.  Diuretics:  Following with nephrology to help with diuretic management due to renal dysfunction.  Currently taking spironolactone 25 mg daily and torsemide 10 mg daily. Reports he is following a low sodium diet.    MELD 3.0: 17 on 05/26/22. 18 based on labs in August/September.  [place]: 05/17/22- no focal liver lesion.  AFP: Normal April 2024.  Hep A/B vaccination: Immune    He also has  history of anemia and has been following with hematology.  No evidence of iron, B12, or folate deficiency.  Hematology did advise taking iron daily and also recommended bone marrow biopsy (not completed), EGD, colonoscopy.  Patient is currently scheduled for EGD and colonoscopy 03/22/23, but in light of recent CVA, we will need to postpone this.  Most recent hemoglobin 10.4 on 03/03/2023 which is near his baseline.    Past Medical History:  Diagnosis Date   ADD (attention  deficit disorder)    ADHD    Anxiety 1987   mild (crowds, noises)   Anxiety    Attention deficit disorder    Cancer (HCC)    Cirrhosis of liver (HCC)    CVA (cerebral vascular accident) (HCC)    Depression    Diabetic neuropathy (HCC)    Essential hypertension, benign    Long-standing history, negative secondary workup   Headache    History of cardiac catheterization    Widely patent coronary and renal arteries November 2013   Hyperlipidemia    Hypertension    whitecoat   Obstructive sleep apnea    cpap   Prediabetes    Sleep apnea    TIA (transient ischemic attack)    Possible, 2007   Type 2 diabetes mellitus (HCC)    Venous stasis     Past Surgical History:  Procedure Laterality Date   ANTERIOR CERVICAL DECOMP/DISCECTOMY FUSION N/A 12/30/2015   Procedure: ANTERIOR CERVICAL DECOMPRESSION/DISCECTOMY FUSION C6 - C7 1 LEVEL;  Surgeon: Venita Lick, MD;  Location: [facility] OR;  Service: Orthopedics;  Laterality: N/A;   CARDIAC CATHETERIZATION  01/2012   negative   CARPAL TUNNEL RELEASE     CERVICAL SPINE NERVE BLOCK  05/17/2017   IR INTRAVASCULAR ULTRASOUND NON CORONARY  02/14/2022   IR PARACENTESIS  02/14/2022   IR PARACENTESIS  05/09/2022   IR RADIOLOGIST EVAL & MGMT  12/03/2021   IR RADIOLOGIST EVAL & MGMT  01/13/2022   IR RADIOLOGIST EVAL & MGMT  03/24/2022   IR RADIOLOGIST EVAL & MGMT  04/20/2022   IR RADIOLOGIST EVAL & MGMT  06/08/2022   IR RADIOLOGIST EVAL & MGMT  08/09/2022   IR RADIOLOGIST EVAL & MGMT  02/16/2023   IR TIPS  02/14/2022   IR TIPS REVISION MOD SED  05/09/2022   IR US GUIDE VASC ACCESS RIGHT  02/14/2022   IR US GUIDE VASC ACCESS RIGHT  02/14/2022   IR US GUIDE VASC ACCESS RIGHT  05/09/2022   KNEE SURGERY Right    NECK SURGERY     RADIOLOGY WITH ANESTHESIA N/A 02/14/2022   Procedure: TIPS;  Surgeon: Bennie Dallas, MD;  Location: [facility] OR;  Service: Radiology;  Laterality: N/A;   VASECTOMY      Current Outpatient Medications  Medication Sig Dispense Refill    ascorbic Acid (VITAMIN C) 500 MG CPCR Take 500 mg by mouth daily.     aspirin EC 81 MG tablet Take 1 tablet (81 mg total) by mouth daily for 21 days. Swallow whole. 21 tablet 0   atorvastatin (LIPITOR) 80 MG tablet Take 1 tablet (80 mg total) by mouth daily. 30 tablet 2   Blood Glucose Monitoring Suppl DEVI 1 each by Does not apply route in the morning, at noon, and at bedtime. May substitute to any manufacturer covered by patient's insurance Dx E11.22 1 each 0   Cholecalciferol (D3 VITAMIN PO) Take by mouth daily. 5000 IU daily     clopidogrel (PLAVIX) 75 MG tablet Take 1  tablet (75 mg total) by mouth daily. 30 tablet 2   Copper Gluconate 2 MG TABS TAKE 2 AND 1/2 TABLETS (5 MG TOTAL) BY MOUTH 2 (TWO) TIMES DAILY FOR 7 DAYS, THEN 2 AND 1/2 TABLETS (5 MG TOTAL) DAILY. 70 tablet 0   dorzolamide (TRUSOPT) 2 % ophthalmic solution Place 1 drop into both eyes 2 (two) times daily.     fluticasone (FLONASE) 50 MCG/ACT nasal spray SPRAY 2 SPRAYS INTO EACH NOSTRIL EVERY DAY (Patient taking differently: Place 1 spray into both nostrils daily as needed for rhinitis or allergies.) 48 mL 0   Glucose Blood (BLOOD GLUCOSE TEST STRIPS) STRP Use to check blood sugar once daily before meals. May substitute to any manufacturer covered by patient's insurance E11.22 100 strip 3   lactulose (CHRONULAC) 10 GM/15ML solution Take 30 mLs (20 g total) by mouth 3 (three) times daily. 2700 mL 5   Lancet Device MISC Use to check blood sugar once daily before meals. May substitute to any manufacturer covered by patient's insurance E11.22 100 each 3   Lancets Misc. MISC Use to check blood sugar once daily before meals. May substitute to any manufacturer covered by patient's insurance 100 each 1   metFORMIN (GLUCOPHAGE) 500 MG tablet TAKE 1 TABLET BY MOUTH EVERY DAY 90 tablet 0   OVER THE COUNTER MEDICATION Take 2 tablets by mouth daily. Pure Encapsulation Copper     pantoprazole (PROTONIX) 40 MG tablet TAKE 1 TABLET BY MOUTH EVERY  DAY 90 tablet 1   rifaximin (XIFAXAN) 550 MG TABS tablet Take 1 tablet (550 mg total) by mouth 2 (two) times daily. 60 tablet 11   spironolactone (ALDACTONE) 25 MG tablet Take 25 mg by mouth daily.     torsemide (DEMADEX) 10 MG tablet Take 10 mg by mouth daily. For two weeks     venlafaxine XR (EFFEXOR-XR) 150 MG 24 hr capsule TAKE 1 CAPSULE BY MOUTH TWICE A DAY 180 capsule 0   No current facility-administered medications for this visit.    Allergies as of 03/06/2023   (No Known Allergies)    Family History  Problem Relation Age of Onset   Colon cancer Mother    Hypertension Mother    CAD Mother    Breast cancer Mother    Cirrhosis Brother     Social History   Socioeconomic History   Marital status: Married    Spouse name: Not on file   Number of children: 4   Years of education: Not on file   Highest education level: 9th grade  Occupational History   Occupation: Systems developer   Tobacco Use   Smoking status: Never   Smokeless tobacco: Not on file   Tobacco comments:    Uses dip pouches occasionally  Vaping Use   Vaping status: Never Used  Substance and Sexual Activity   Alcohol use: No    Alcohol/week: 0.0 standard drinks of alcohol    Comment: Previously 1-2 cases of beer/week. patient states never on regular basis though. last etoh in 2013.    Drug use: No   Sexual activity: Not Currently  Other Topics Concern   Not on file  Social History Narrative   ** Merged History Encounter **       Social Drivers of Health   Financial Resource Strain: Patient Declined (02/10/2023)   Overall Financial Resource Strain (CARDIA)    Difficulty of Paying Living Expenses: Patient declined  Food Insecurity: No Food Insecurity (03/01/2023)   Hunger Vital Sign  Worried About Programme researcher, broadcasting/film/video in the Last Year: Never true    Ran Out of Food in the Last Year: Never true  Transportation Needs: No Transportation Needs (03/01/2023)   PRAPARE - Scientist, research (physical sciences) (Medical): No    Lack of Transportation (Non-Medical): No  Recent Concern: Transportation Needs - Unmet Transportation Needs (02/10/2023)   PRAPARE - Administrator, Civil Service (Medical): Yes    Lack of Transportation (Non-Medical): No  Physical Activity: Patient Declined (02/10/2023)   Exercise Vital Sign    Days of Exercise per Week: Patient declined    Minutes of Exercise per Session: Patient declined  Stress: Stress Concern Present (02/10/2023)   [employer] of Occupational Health - Occupational Stress Questionnaire    Feeling of Stress : Rather much  Social Connections: Moderately Isolated (02/10/2023)   Social Connection and Isolation Panel [NHANES]    Frequency of Communication with Friends and Family: More than three times a week    Frequency of Social Gatherings with Friends and Family: Once a week    Attends Religious Services: Never    Database administrator or Organizations: No    Attends Engineer, structural: Never    Marital Status: Married    Review of Systems: Gen: Denies fever, chills, cold or flulike symptoms, presyncope, syncope. CV: Denies chest pain, palpitations. Resp: Denies dyspnea, cough. GI: See HPI Heme: See HPI  Physical Exam: BP (!) 142/76 (BP Location: Right Arm, Patient Position: Sitting, Cuff Size: Large)   Pulse 85   Temp 98.2 F (36.8 C) (Temporal)   Ht 5\' 5"  (1.651 m)   Wt 217 lb 9.6 oz (98.7 kg)   BMI 36.21 kg/m  General:   Alert. No distress noted. Pleasant and cooperative.  Moving slowly when walking to exam table. Slightly unsteady when getting off exam table.   Head:  Normocephalic and atraumatic. Eyes:  Conjuctiva clear without scleral icterus. Heart:  S1, S2 present.  Mild systolic murmur. Lungs:  Clear to auscultation bilaterally. No wheezes, rales, or rhonchi. No distress.  Abdomen:  +BS, full, soft, non-tender. No rebound or guarding. No HSM or masses noted. Msk:  Symmetrical without  gross deformities. Normal posture. Extremities:  With pitting edema into his thighs. Neurologic:  Alert and  oriented x4, but seems to be slower in his response time.  Also with slight asterixis. Psych:  Normal mood and affect.    Assessment:  67 y.o. male with GI history of GERD, decompensated NASH +/- alcohol cirrhosis (abstinent since December 2013), TIPS in December 2023, ADHD, anxiety, depression, neuropathy, HTN, HLD, diabetes, CKD, anemia, prior CVA and more recently with acute CVA on 03/01/2023, presenting today due to concerns about hepatic encephalopathy.  Hepatic encephalopathy: I suspect patient is dealing with recurrent hepatic encephalopathy.  He has some mild asterixis on exam today and is having some mild increase in response time though he is alert and oriented x 4.  Wife states his current symptoms are nothing like his symptoms when he was having a stroke.  He has been compliant with lactulose and Xifaxan.  Denies any other symptoms to suggest acute illness/infection.  Recommended increasing lactulose and checking an ammonia level.  Advised his wife to take him to the ER if any worsening mental status changes.  Cirrhosis: Secondary to NASH +/- alcohol.  Abstinent since December 2013. MELD 3.0, 18 in December 2024. Immune to Hep A/B. No prior EGD and having to postpone  again due to recent acute CVA. HCC screening up to date November 2024 with no focal liver lesion. History of intermittently mildly elevated AFP, but no HCC on MRI in 2022. AFP within normal limits in November.  Cirrhosis has been complicated by recurrent ascites/peripheral edema, hepatic encephalopathy, thrombocytopenia/coagulopathy following with hematology, renal insufficiency following with nephrology.  He has established with [facility] and is undergoing transplant evaluation.   Ascites/peripheral edema: S/p TIPS in December 2023 but continued requiring frequent paras, up to twice weekly, as developed  renal insufficiency, intolerant to diuretics. He underwent TIPS balloon expansion 05/09/2022 and frequency of paracentesis has decreased significantly.  Last paracentesis was in July. He is maintaining on spironolactone 25 mg and torsemide 10 mg daily per nephrology.  His abdomen is full, but not tense on exam today.  He continues to have significant peripheral edema with pitting into the his thighs and also with some edema in his arms/hands. This increase in fluid retention likely related to hypoalbuminemia and recent IV fluids during hospital admission.  As his most recent creatinine was 1.28, recommended follow-up with nephrology to see if he has any room for increasing diuretics.  Will also need to follow a high-protein diet.  Esophageal variceal screening: No prior EGD.  Currently scheduled for 03/22/2023, but we will need to cancel this due to recent acute CVA.  Colon cancer screening: Needs first-ever screening colonoscopy.  Mother with history of colon cancer. He has no significant lower GI symptoms or alarm symptoms.  He was found to have new onset anemia in February 2024 (following with hematology), but no evidence of iron deficiency (though hematology has recommended iron daily), no overt GI bleeding, and Hemoccult negative x 3 in March.  He is currently scheduled for colonoscopy on 03/22/2023, but we will need to cancel this due to recent acute CVA.   History of stroke: Acute CVA on 03/01/2023.  Patient now on Plavix and aspirin and is to have 30-day cardiac monitor.  This has not been set up yet.  Advised patient reach out to neurology or PCP about this if he doesn't hear anything by the middle of the week.  Pending workup/clearance by neurology, we will hopefully be able to proceed with EGD and colonoscopy in about 3 months.   Plan:  Take an extra dose of lactulose this evening.  Recommended 45 mL. Increase daily lactulose to 45 mL 3 times a day starting tomorrow. Will have to cancel EGD and  colonoscopy for at least 3 months following acute CVA.  Will need to complete workup with neurology prior to rescheduling. Continue current diuretic regimen.  Recommended follow-up with nephrology. Follow-up in 4 weeks due to HE.    Ermalinda Memos, PA-C [facility] 03/06/2023

## 2023-03-06 NOTE — Patient Instructions (Addendum)
It does appear that you are having some mild hepatic encephalopathy.  As we discussed, go ahead and take an extra dose of lactulose this evening.  I recommend that you take 45 mL.  I recommend that you increase your dose of lactulose dose tomorrow to 45 mL 3 times a day.  If you have any worsening mental status changes, please proceed to the emergency room.  Continue your current diuretic regimen with spironolactone 25 mg daily and torsemide 10 mg daily.  I recommend that you reach out to your nephrologist to arrange a follow-up as you have quite a bit of lower extremity swelling and some swelling in your upper extremities as well.  We are going to postpone your colonoscopy and upper endoscopy for now until you have been cleared by neurology.  At minimum, we will hold off for 3 months unless it is needed urgently for obvious GI bleeding/declining hemoglobin.  I will see back in the office in 4 weeks or sooner if needed.  Ermalinda Memos, PA-C [facility]

## 2023-03-07 ENCOUNTER — Other Ambulatory Visit: Payer: Self-pay | Admitting: Gastroenterology

## 2023-03-07 DIAGNOSIS — K7682 Hepatic encephalopathy: Secondary | ICD-10-CM

## 2023-03-07 LAB — AMMONIA: Ammonia: 211 ug/dL (ref 40–200)

## 2023-03-07 MED ORDER — LACTULOSE 10 GM/15ML PO SOLN: 30.0000 g | Freq: Three times a day (TID) | ORAL | 5 refills | Status: DC

## 2023-03-07 NOTE — Telephone Encounter (Signed)
Noted.. message sent to endo to cancel procedure.

## 2023-03-11 DIAGNOSIS — G459 Transient cerebral ischemic attack, unspecified: Secondary | ICD-10-CM | POA: Diagnosis not present

## 2023-03-14 ENCOUNTER — Ambulatory Visit: Payer: Medicare Other | Attending: Internal Medicine

## 2023-03-15 ENCOUNTER — Encounter: Payer: Self-pay | Admitting: Internal Medicine

## 2023-03-16 ENCOUNTER — Other Ambulatory Visit: Payer: Self-pay | Admitting: Gastroenterology

## 2023-03-16 DIAGNOSIS — D631 Anemia in chronic kidney disease: Secondary | ICD-10-CM | POA: Diagnosis not present

## 2023-03-16 DIAGNOSIS — E211 Secondary hyperparathyroidism, not elsewhere classified: Secondary | ICD-10-CM | POA: Diagnosis not present

## 2023-03-16 DIAGNOSIS — R809 Proteinuria, unspecified: Secondary | ICD-10-CM | POA: Diagnosis not present

## 2023-03-16 DIAGNOSIS — N189 Chronic kidney disease, unspecified: Secondary | ICD-10-CM | POA: Diagnosis not present

## 2023-03-16 DIAGNOSIS — R112 Nausea with vomiting, unspecified: Secondary | ICD-10-CM

## 2023-03-16 MED ORDER — ONDANSETRON 4 MG PO TBDP: 4.0000 mg | ORAL_TABLET | Freq: Three times a day (TID) | ORAL | 0 refills | Status: DC | PRN

## 2023-03-16 NOTE — Telephone Encounter (Signed)
 Spoke with patient's wife.  Patient had 1 episode of vomiting on Tuesday and 1 episode of vomiting yesterday, some decreased p.o. intake.  Denies abdominal pain, fever, mental status changes.  He is still drinking fluids.  Still having at least 2 bowel movements a day.  No nausea this morning.  Just went to give blood for Dr. Rachele.   Etiology of his current symptoms is not clear but could be secondary to a self-limiting gastroenteritis.  I will prescribe Zofran  4 mg every 8 hours as needed.  Recommended bland diet and focusing on good hydration for now.  If persistent symptoms, they will let us  know.  Recommended ER evaluation if worsening/persistent symptoms over the weekend.

## 2023-03-17 ENCOUNTER — Other Ambulatory Visit (HOSPITAL_COMMUNITY): Payer: Medicare Other

## 2023-03-17 ENCOUNTER — Emergency Department (HOSPITAL_COMMUNITY)
Admission: EM | Admit: 2023-03-17 | Discharge: 2023-04-08 | Disposition: E | Payer: Medicare Other | Attending: Internal Medicine | Admitting: Internal Medicine

## 2023-03-17 ENCOUNTER — Emergency Department (HOSPITAL_COMMUNITY): Payer: Medicare Other

## 2023-03-17 ENCOUNTER — Other Ambulatory Visit: Payer: Self-pay

## 2023-03-17 ENCOUNTER — Encounter (HOSPITAL_COMMUNITY): Payer: Self-pay | Admitting: *Deleted

## 2023-03-17 DIAGNOSIS — Z7984 Long term (current) use of oral hypoglycemic drugs: Secondary | ICD-10-CM | POA: Diagnosis not present

## 2023-03-17 DIAGNOSIS — Z79899 Other long term (current) drug therapy: Secondary | ICD-10-CM

## 2023-03-17 DIAGNOSIS — R1084 Generalized abdominal pain: Secondary | ICD-10-CM | POA: Diagnosis not present

## 2023-03-17 DIAGNOSIS — R791 Abnormal coagulation profile: Secondary | ICD-10-CM | POA: Diagnosis not present

## 2023-03-17 DIAGNOSIS — Z20822 Contact with and (suspected) exposure to covid-19: Secondary | ICD-10-CM | POA: Insufficient documentation

## 2023-03-17 DIAGNOSIS — I440 Atrioventricular block, first degree: Secondary | ICD-10-CM | POA: Diagnosis not present

## 2023-03-17 DIAGNOSIS — R092 Respiratory arrest: Secondary | ICD-10-CM | POA: Diagnosis present

## 2023-03-17 DIAGNOSIS — I959 Hypotension, unspecified: Secondary | ICD-10-CM | POA: Diagnosis not present

## 2023-03-17 DIAGNOSIS — R140 Abdominal distension (gaseous): Secondary | ICD-10-CM | POA: Insufficient documentation

## 2023-03-17 DIAGNOSIS — Z7982 Long term (current) use of aspirin: Secondary | ICD-10-CM | POA: Diagnosis not present

## 2023-03-17 DIAGNOSIS — R0681 Apnea, not elsewhere classified: Secondary | ICD-10-CM | POA: Diagnosis present

## 2023-03-17 DIAGNOSIS — Z1152 Encounter for screening for COVID-19: Secondary | ICD-10-CM

## 2023-03-17 DIAGNOSIS — A419 Sepsis, unspecified organism: Principal | ICD-10-CM | POA: Insufficient documentation

## 2023-03-17 DIAGNOSIS — K7460 Unspecified cirrhosis of liver: Secondary | ICD-10-CM | POA: Diagnosis not present

## 2023-03-17 DIAGNOSIS — I469 Cardiac arrest, cause unspecified: Secondary | ICD-10-CM | POA: Diagnosis not present

## 2023-03-17 DIAGNOSIS — I468 Cardiac arrest due to other underlying condition: Secondary | ICD-10-CM | POA: Diagnosis present

## 2023-03-17 DIAGNOSIS — Z7902 Long term (current) use of antithrombotics/antiplatelets: Secondary | ICD-10-CM | POA: Diagnosis not present

## 2023-03-17 DIAGNOSIS — R4182 Altered mental status, unspecified: Secondary | ICD-10-CM | POA: Diagnosis not present

## 2023-03-17 DIAGNOSIS — F1090 Alcohol use, unspecified, uncomplicated: Secondary | ICD-10-CM | POA: Diagnosis not present

## 2023-03-17 LAB — CBC WITH DIFFERENTIAL/PLATELET
Abs Immature Granulocytes: 0.76 10*3/uL — ABNORMAL HIGH (ref 0.00–0.07)
Basophils Absolute: 0.1 10*3/uL (ref 0.0–0.1)
Basophils Relative: 1 %
Eosinophils Absolute: 0.2 10*3/uL (ref 0.0–0.5)
Eosinophils Relative: 1 %
HCT: 39.3 % (ref 39.0–52.0)
Hemoglobin: 12 g/dL — ABNORMAL LOW (ref 13.0–17.0)
Immature Granulocytes: 3 %
Lymphocytes Relative: 8 %
Lymphs Abs: 1.7 10*3/uL (ref 0.7–4.0)
MCH: 35.7 pg — ABNORMAL HIGH (ref 26.0–34.0)
MCHC: 30.5 g/dL (ref 30.0–36.0)
MCV: 117 fL — ABNORMAL HIGH (ref 80.0–100.0)
Monocytes Absolute: 2.2 10*3/uL — ABNORMAL HIGH (ref 0.1–1.0)
Monocytes Relative: 10 %
Neutro Abs: 17.6 10*3/uL — ABNORMAL HIGH (ref 1.7–7.7)
Neutrophils Relative %: 77 %
Platelets: 133 10*3/uL — ABNORMAL LOW (ref 150–400)
RBC: 3.36 MIL/uL — ABNORMAL LOW (ref 4.22–5.81)
RDW: 17.2 % — ABNORMAL HIGH (ref 11.5–15.5)
WBC: 22.5 10*3/uL — ABNORMAL HIGH (ref 4.0–10.5)
nRBC: 0 % (ref 0.0–0.2)

## 2023-03-17 LAB — COMPREHENSIVE METABOLIC PANEL
ALT: 1172 U/L — ABNORMAL HIGH (ref 0–44)
AST: 2598 U/L — ABNORMAL HIGH (ref 15–41)
Albumin: <1.5 g/dL — ABNORMAL LOW (ref 3.5–5.0)
Alkaline Phosphatase: 225 U/L — ABNORMAL HIGH (ref 38–126)
Anion gap: 25 — ABNORMAL HIGH (ref 5–15)
BUN: 66 mg/dL — ABNORMAL HIGH (ref 8–23)
CO2: 9 mmol/L — ABNORMAL LOW (ref 22–32)
Calcium: 7.9 mg/dL — ABNORMAL LOW (ref 8.9–10.3)
Chloride: 97 mmol/L — ABNORMAL LOW (ref 98–111)
Creatinine, Ser: 5.25 mg/dL — ABNORMAL HIGH (ref 0.61–1.24)
GFR, Estimated: 11 mL/min — ABNORMAL LOW (ref >60)
Glucose, Bld: 193 mg/dL — ABNORMAL HIGH (ref 70–99)
Potassium: 6.6 mmol/L (ref 3.5–5.1)
Sodium: 131 mmol/L — ABNORMAL LOW (ref 135–145)
Total Bilirubin: 6.3 mg/dL — ABNORMAL HIGH (ref 0.0–1.2)
Total Protein: 4.3 g/dL — ABNORMAL LOW (ref 6.5–8.1)

## 2023-03-17 LAB — URINALYSIS, W/ REFLEX TO CULTURE (INFECTION SUSPECTED)
Bilirubin Urine: NEGATIVE
Glucose, UA: 50 mg/dL — AB
Ketones, ur: NEGATIVE mg/dL
Leukocytes,Ua: NEGATIVE
Nitrite: NEGATIVE
Protein, ur: 100 mg/dL — AB
Specific Gravity, Urine: 1.017 (ref 1.005–1.030)
pH: 5 (ref 5.0–8.0)

## 2023-03-17 LAB — RESP PANEL BY RT-PCR (RSV, FLU A&B, COVID)  RVPGX2
Influenza A by PCR: NEGATIVE
Influenza B by PCR: NEGATIVE
Resp Syncytial Virus by PCR: NEGATIVE
SARS Coronavirus 2 by RT PCR: NEGATIVE

## 2023-03-17 LAB — AMMONIA: Ammonia: 390 umol/L — ABNORMAL HIGH (ref 9–35)

## 2023-03-17 LAB — CBG MONITORING, ED
Glucose-Capillary: 172 mg/dL — ABNORMAL HIGH (ref 70–99)
Glucose-Capillary: 83 mg/dL (ref 70–99)
Glucose-Capillary: <10 mg/dL — CL (ref 70–99)
Glucose-Capillary: 110 mg/dL — ABNORMAL HIGH (ref 70–99)

## 2023-03-17 LAB — PROTIME-INR
INR: 8.9 (ref 0.8–1.2)
Prothrombin Time: 72.7 s — ABNORMAL HIGH (ref 11.4–15.2)

## 2023-03-17 LAB — LACTIC ACID, PLASMA: Lactic Acid, Venous: >9 mmol/L (ref 0.5–1.9)

## 2023-03-17 LAB — APTT: aPTT: 107 s — ABNORMAL HIGH (ref 24–36)

## 2023-03-17 MED ORDER — EPINEPHRINE 1 MG/10ML IJ SOSY
PREFILLED_SYRINGE | INTRAMUSCULAR | Status: AC
  Filled 2023-03-17: qty 10

## 2023-03-17 MED ORDER — LORAZEPAM 2 MG/ML IJ SOLN
INTRAMUSCULAR | Status: AC
  Filled 2023-03-17: qty 1

## 2023-03-17 MED ORDER — SODIUM CHLORIDE 0.9 % IV BOLUS
1000.0000 mL | Freq: Once | INTRAVENOUS | Status: AC
  Administered 2023-03-17: 1000 mL via INTRAVENOUS

## 2023-03-17 MED ORDER — LORAZEPAM 2 MG/ML IJ SOLN
1.0000 mg | Freq: Once | INTRAMUSCULAR | Status: AC
  Administered 2023-03-17: 1 mg via INTRAVENOUS

## 2023-03-17 MED ORDER — SODIUM BICARBONATE 8.4 % IV SOLN
INTRAVENOUS | Status: AC | PRN
  Administered 2023-03-17: 50 meq via INTRAVENOUS

## 2023-03-17 MED ORDER — AMIODARONE IV BOLUS ONLY 150 MG/100ML
INTRAVENOUS | Status: AC | PRN
  Administered 2023-03-17: 300 mg via INTRAVENOUS

## 2023-03-17 MED ORDER — CALCIUM GLUCONATE 10 % IV SOLN
1.0000 g | Freq: Once | INTRAVENOUS | Status: AC
  Administered 2023-03-17: 1 g via INTRAVENOUS
  Filled 2023-03-17: qty 10

## 2023-03-17 MED ORDER — DEXTROSE 50 % IV SOLN
25.0000 mL | Freq: Once | INTRAVENOUS | Status: AC
  Administered 2023-03-17: 25 mL via INTRAVENOUS

## 2023-03-17 MED ORDER — PROTHROMBIN COMPLEX CONC HUMAN 500 UNITS IV KIT
4794.0000 [IU] | PACK | Status: DC
  Filled 2023-03-17: qty 4794

## 2023-03-17 MED ORDER — PROPOFOL 1000 MG/100ML IV EMUL
5.0000 ug/kg/min | INTRAVENOUS | Status: DC
  Administered 2023-03-17: 5 ug/kg/min via INTRAVENOUS
  Filled 2023-03-17: qty 100

## 2023-03-17 MED ORDER — SODIUM CHLORIDE 0.9 % IV SOLN
2.0000 g | Freq: Once | INTRAVENOUS | Status: AC
  Administered 2023-03-17: 2 g via INTRAVENOUS
  Filled 2023-03-17: qty 20

## 2023-03-17 MED ORDER — DEXTROSE 50 % IV SOLN
50.0000 mL | Freq: Once | INTRAVENOUS | Status: AC
  Filled 2023-03-17: qty 50

## 2023-03-17 MED ORDER — SODIUM CHLORIDE 0.9 % IV SOLN
50.0000 ug/h | INTRAVENOUS | Status: DC
  Filled 2023-03-17 (×3): qty 1

## 2023-03-17 MED ORDER — EPINEPHRINE 1 MG/10ML IJ SOSY
PREFILLED_SYRINGE | INTRAMUSCULAR | Status: AC | PRN
  Administered 2023-03-17: 1 mg via INTRAVENOUS

## 2023-03-17 MED ORDER — PANTOPRAZOLE SODIUM 40 MG IV SOLR
40.0000 mg | Freq: Once | INTRAVENOUS | Status: AC
  Administered 2023-03-17: 40 mg via INTRAVENOUS
  Filled 2023-03-17: qty 10

## 2023-03-17 MED ORDER — OCTREOTIDE LOAD VIA INFUSION
100.0000 ug | Freq: Once | INTRAVENOUS | Status: DC
  Filled 2023-03-17: qty 50

## 2023-03-17 MED ORDER — DEXTROSE 50 % IV SOLN
INTRAVENOUS | Status: AC
  Administered 2023-03-17: 50 mL via INTRAVENOUS
  Filled 2023-03-17: qty 50

## 2023-03-17 MED ORDER — VITAMIN K1 10 MG/ML IJ SOLN
10.0000 mg | INTRAVENOUS | Status: DC
  Filled 2023-03-17: qty 1

## 2023-03-17 MED ORDER — DEXTROSE 50 % IV SOLN: 50.0000 mL | Freq: Once | INTRAVENOUS | Status: DC

## 2023-03-17 MED ORDER — EPINEPHRINE 1 MG/10ML IJ SOSY
PREFILLED_SYRINGE | INTRAMUSCULAR | Status: AC | PRN
  Administered 2023-03-17 (×3): 1 mg via INTRAVENOUS

## 2023-03-18 LAB — URINE CULTURE: Culture: NO GROWTH

## 2023-03-20 ENCOUNTER — Inpatient Hospital Stay: Payer: Medicare Other

## 2023-03-20 ENCOUNTER — Inpatient Hospital Stay: Payer: BLUE CROSS/BLUE SHIELD | Admitting: Family Medicine

## 2023-03-22 ENCOUNTER — Ambulatory Visit (HOSPITAL_COMMUNITY): Admit: 2023-03-22 | Payer: Medicare Other | Admitting: Internal Medicine

## 2023-03-22 ENCOUNTER — Encounter (HOSPITAL_COMMUNITY): Payer: Self-pay

## 2023-03-22 LAB — CULTURE, BLOOD (ROUTINE X 2)
Culture: NO GROWTH
Culture: NO GROWTH
Special Requests: ADEQUATE

## 2023-03-22 SURGERY — COLONOSCOPY WITH PROPOFOL
Anesthesia: Monitor Anesthesia Care

## 2023-03-27 ENCOUNTER — Inpatient Hospital Stay: Payer: Medicare Other | Admitting: Physician Assistant

## 2023-04-03 ENCOUNTER — Ambulatory Visit: Payer: BLUE CROSS/BLUE SHIELD | Admitting: Gastroenterology

## 2023-04-08 NOTE — ED Notes (Signed)
 1019-Code Blue called-pulseless, no respirations, compressions and BVM immediately initiated by care team 1021-1 mg epi IV given 1023-PEA, CBG 110 1024-1 mg epi IV given 1025-ETT #7 @24  cm, + color change 1027-PEA, 1 mg EPI given 1030-asystole, 1 mg epi IV given 1033-hr 80, junctional, EDP gave verbal order to start norepinephrine drip and will enter further orders

## 2023-04-08 NOTE — ED Provider Notes (Signed)
 I spoke with Dr. Lilyan Punt.  He is the patient's primary care doctor and Dr. Gerda Diss stated he will take care of signing the death certificate   Bethann Berkshire, MD 03-22-23 1236

## 2023-04-08 NOTE — ED Triage Notes (Signed)
 Pt BIB [facility] for c/o AMS; wife told ems pt went to bed last night at 2130 and was normal and when he woke up this am he had slurred speech and leaning to the left  Pt told ems he has been vomiting for the last 2 days   Upon arrival pt is altered and jaundice and unable to answer any questions  Cbg with ems 77

## 2023-04-08 NOTE — Sepsis Progress Note (Signed)
 Elink will follow per sepsis protocol.

## 2023-04-08 NOTE — ED Provider Notes (Signed)
 [place] EMERGENCY DEPARTMENT AT [facility] Provider Note   CSN: [id number] Arrival date & time: 2023-04-16  9175     History  Chief Complaint  Patient presents with   Altered Mental Status    Shawn L Reily Ilic. is a 68 y.o. male.  Patient has a history of cirrhosis.  He presented to the emergency department altered.  His glucose was less than 10.  Patient was given an amp of D50 and another half amp of D50.  He awoke but was confused.  The history is provided by the EMS personnel. No language interpreter was used.  Altered Mental Status Presenting symptoms: behavior changes   Severity:  Severe Most recent episode:  Today Episode history:  Continuous Timing:  Constant Progression:  Waxing and waning Chronicity:  Recurrent Context: alcohol use   Associated symptoms: no abdominal pain        Home Medications Prior to Admission medications   Medication Sig Start Date End Date Taking? Authorizing Provider  ondansetron  (ZOFRAN -ODT) 4 MG disintegrating tablet Take 1 tablet (4 mg total) by mouth every 8 (eight) hours as needed for nausea or vomiting. 03/16/23   Rudy Josette RAMAN, PA-C  ascorbic Acid  (VITAMIN C ) 500 MG CPCR Take 500 mg by mouth daily.    [provider]  aspirin  EC 81 MG tablet Take 1 tablet (81 mg total) by mouth daily for 21 days. Swallow whole. 03/04/23 03/25/23  Maree, Pratik D, DO  atorvastatin  (LIPITOR) 80 MG tablet Take 1 tablet (80 mg total) by mouth daily. 03/04/23 04/03/23  Maree, Pratik D, DO  Blood Glucose Monitoring Suppl DEVI 1 each by Does not apply route in the morning, at noon, and at bedtime. May substitute to any manufacturer covered by patient's insurance Dx E11.22 05/12/22   Alphonsa Glendia LABOR, MD  Cholecalciferol  (D3 VITAMIN PO) Take by mouth daily. 5000 IU daily    [provider]  clopidogrel  (PLAVIX ) 75 MG tablet Take 1 tablet (75 mg total) by mouth daily. 03/04/23   Maree, Pratik D, DO  Copper  Gluconate 2 MG TABS TAKE 2  AND 1/2 TABLETS (5 MG TOTAL) BY MOUTH 2 (TWO) TIMES DAILY FOR 7 DAYS, THEN 2 AND 1/2 TABLETS (5 MG TOTAL) DAILY. 06/12/22   Lamon Pleasant HERO, PA-C  dorzolamide  (TRUSOPT ) 2 % ophthalmic solution Place 1 drop into both eyes 2 (two) times daily. 04/04/22   [provider]  fluticasone  (FLONASE ) 50 MCG/ACT nasal spray SPRAY 2 SPRAYS INTO EACH NOSTRIL EVERY DAY Patient taking differently: Place 1 spray into both nostrils daily as needed for rhinitis or allergies. 12/05/22   Alphonsa Glendia LABOR, MD  Glucose Blood (BLOOD GLUCOSE TEST STRIPS) STRP Use to check blood sugar once daily before meals. May substitute to any manufacturer covered by patient's insurance E11.22 05/12/22   Alphonsa Glendia LABOR, MD  lactulose  (CHRONULAC ) 10 GM/15ML solution Take 45 mLs (30 g total) by mouth 3 (three) times daily. 03/07/23 03/06/24  Rudy Josette RAMAN, PA-C  Lancet Device MISC Use to check blood sugar once daily before meals. May substitute to any manufacturer covered by patient's insurance E11.22 05/12/22   Alphonsa Glendia LABOR, MD  Lancets Misc. MISC Use to check blood sugar once daily before meals. May substitute to any manufacturer covered by patient's insurance 05/12/22   Alphonsa Glendia LABOR, MD  metFORMIN  (GLUCOPHAGE ) 500 MG tablet TAKE 1 TABLET BY MOUTH EVERY DAY 12/29/22   Alphonsa Glendia LABOR, MD  OVER THE COUNTER MEDICATION Take 2 tablets  by mouth daily. Pure Encapsulation Copper     [provider]  pantoprazole  (PROTONIX ) 40 MG tablet TAKE 1 TABLET BY MOUTH EVERY DAY 11/09/22   Alphonsa Glendia LABOR, MD  rifaximin  (XIFAXAN ) 550 MG TABS tablet Take 1 tablet (550 mg total) by mouth 2 (two) times daily. 04/13/22   Rourk, Lamar HERO, MD  spironolactone  (ALDACTONE ) 25 MG tablet Take 25 mg by mouth daily. 05/23/22 05/23/23  [provider]  torsemide  (DEMADEX ) 10 MG tablet Take 10 mg by mouth daily. For two weeks 05/23/22 05/23/23  [provider]  venlafaxine  XR (EFFEXOR -XR) 150 MG 24 hr capsule TAKE 1 CAPSULE BY MOUTH TWICE  A DAY 12/29/22   Hoskins, Carolyn C, NP      Allergies    Patient has no known allergies.    Review of Systems   Review of Systems  Unable to perform ROS: Acuity of condition  Gastrointestinal:  Negative for abdominal pain.    Physical Exam Updated Vital Signs BP 113/81 (BP Location: Left Arm)   Pulse 81   Temp (!) 94.9 F (34.9 C) (Core)   Resp 20   Ht 5' 5 (1.651 m)   Wt 98.7 kg   BMI 36.21 kg/m  Physical Exam Nursing note reviewed.  Constitutional:      Appearance: He is well-developed.     Comments: Lethargic  HENT:     Head: Normocephalic.     Nose: Nose normal.  Eyes:     General: Scleral icterus present.  Neck:     Thyroid : No thyromegaly.  Cardiovascular:     Rate and Rhythm: Normal rate and regular rhythm.     Heart sounds: No murmur heard.    No friction rub. No gallop.  Pulmonary:     Breath sounds: No stridor. No wheezing or rales.  Chest:     Chest wall: No tenderness.  Abdominal:     General: There is distension.     Tenderness: There is no abdominal tenderness. There is no rebound.  Musculoskeletal:        General: Normal range of motion.     Cervical back: Neck supple.  Lymphadenopathy:     Cervical: No cervical adenopathy.  Skin:    Findings: No erythema or rash.  Neurological:     Motor: No abnormal muscle tone.     Coordination: Coordination normal.     Comments: Oriented to person only     ED Results / Procedures / Treatments   Labs (all labs ordered are listed, but only abnormal results are displayed) Labs Reviewed  LACTIC ACID, PLASMA - Abnormal; Notable for the following components:      Result Value   Lactic Acid, Venous >9.0 (*)    All other components within normal limits  COMPREHENSIVE METABOLIC PANEL - Abnormal; Notable for the following components:   Sodium 131 (*)    Potassium 6.6 (*)    Chloride 97 (*)    CO2 9 (*)    Glucose, Bld 193 (*)    BUN 66 (*)    Creatinine, Ser 5.25 (*)    Calcium  7.9 (*)    Total  Protein 4.3 (*)    Albumin  <1.5 (*)    ALT 1,172 (*)    Alkaline Phosphatase 225 (*)    Total Bilirubin 6.3 (*)    GFR, Estimated 11 (*)    Anion gap 25 (*)    All other components within normal limits  CBC WITH DIFFERENTIAL/PLATELET - Abnormal; Notable for  the following components:   WBC 22.5 (*)    RBC 3.36 (*)    Hemoglobin 12.0 (*)    MCV 117.0 (*)    MCH 35.7 (*)    RDW 17.2 (*)    Platelets 133 (*)    Neutro Abs 17.6 (*)    Monocytes Absolute 2.2 (*)    Abs Immature Granulocytes 0.76 (*)    All other components within normal limits  PROTIME-INR - Abnormal; Notable for the following components:   Prothrombin  Time 72.7 (*)    INR 8.9 (*)    All other components within normal limits  APTT - Abnormal; Notable for the following components:   aPTT 107 (*)    All other components within normal limits  URINALYSIS, W/ REFLEX TO CULTURE (INFECTION SUSPECTED) - Abnormal; Notable for the following components:   Color, Urine AMBER (*)    APPearance CLOUDY (*)    Glucose, UA 50 (*)    Hgb urine dipstick MODERATE (*)    Protein, ur 100 (*)    Bacteria, UA RARE (*)    All other components within normal limits  AMMONIA - Abnormal; Notable for the following components:   Ammonia 390 (*)    All other components within normal limits  CBG MONITORING, ED - Abnormal; Notable for the following components:   Glucose-Capillary <10 (*)    All other components within normal limits  CBG MONITORING, ED - Abnormal; Notable for the following components:   Glucose-Capillary 172 (*)    All other components within normal limits  CBG MONITORING, ED - Abnormal; Notable for the following components:   Glucose-Capillary 110 (*)    All other components within normal limits  RESP PANEL BY RT-PCR (RSV, FLU A&B, COVID)  RVPGX2  CULTURE, BLOOD (ROUTINE X 2)  CULTURE, BLOOD (ROUTINE X 2)  URINE CULTURE  LACTIC ACID, PLASMA  CBG MONITORING, ED    EKG None  Radiology DG Chest Port 1 View Result  Date: 04/06/23 CLINICAL DATA:  Questionable sepsis - evaluate for abnormality EXAM: PORTABLE CHEST 1 VIEW COMPARISON:  None Available. FINDINGS: Wireless device overlies the mediastinum. The heart and mediastinal contours are unchanged. Low lung volumes. No focal consolidation. No pulmonary edema. No pleural effusion. No pneumothorax. No acute osseous abnormality.  Cervical spine surgical hardware. IMPRESSION: Low lung volumes with no active disease. Electronically Signed   By: Morgane  Naveau M.D.   On: 04/06/23 09:44    Procedures Procedures    Medications Ordered in ED Medications  calcium  gluconate inj 10% (1 g) URGENT USE ONLY! (has no administration in time range)  pantoprazole  (PROTONIX ) injection 40 mg (has no administration in time range)  octreotide  (SANDOSTATIN ) 2 mcg/mL load via infusion 100 mcg (has no administration in time range)    And  octreotide  (SANDOSTATIN ) 500 mcg in sodium chloride  0.9 % 250 mL (2 mcg/mL) infusion (has no administration in time range)  sodium chloride  0.9 % bolus 1,000 mL (has no administration in time range)  prothrombin  complex conc human (KCENTRA ) IVPB 4,935 Units (has no administration in time range)  phytonadione  (VITAMIN K) 10 mg in dextrose  5 % 50 mL IVPB (has no administration in time range)  dextrose  50 % solution 50 mL (50 mLs Intravenous Given 04/06/2023 0853)  dextrose  50 % solution 25 mL (25 mLs Intravenous Given 06-Apr-2023 0854)  sodium chloride  0.9 % bolus 1,000 mL (0 mLs Intravenous Stopped April 06, 2023 1039)  cefTRIAXone  (ROCEPHIN ) 2 g in sodium chloride  0.9 % 100 mL IVPB (  0 g Intravenous Stopped 04/16/2023 1039)  sodium chloride  0.9 % bolus 1,000 mL (1,000 mLs Intravenous New Bag/Given 2023/04/16 1058)    ED Course/ Medical Decision Making/ A&P  CRITICAL CARE  I went to evaluate the patient and I found him pulseless and apneic.  A code was called.  Chest compressions were started immediately.  Patient was intubated by anesthesia.  He was given 5  epinephrines.  He eventually regained a normal blood pressure.  Family was notified about the patient's condition.  Patient was treated for sepsis along with treating his significant cirrhosis.  He was given Kcentra  for his elevated INR.  I spoke with Dr. Harold of critical care at [facility] and he accepted the patient to be transferred to [facility]          Performed by: Fairy Sermon Total critical care time: 65 minutes Critical care time was exclusive of separately billable procedures and treating other patients. Critical care was necessary to treat or prevent imminent or life-threatening deterioration. Critical care was time spent personally by me on the following activities: development of treatment plan with patient and/or surrogate as well as nursing, discussions with consultants, evaluation of patient's response to treatment, examination of patient, obtaining history from patient or surrogate, ordering and performing treatments and interventions, ordering and review of laboratory studies, ordering and review of radiographic studies, pulse oximetry and re-evaluation of patient's condition.                                       Medical Decision Making Amount and/or Complexity of Data Reviewed Labs: ordered. Radiology: ordered.  Risk Prescription drug management. Decision regarding hospitalization.   Patient with sepsis, cirrhosis, respiratory and cardiac arrest.  Patient is intubated and will be transferred to [facility]   Patient coded again in the emergency department and was not resuscitated and was pronounced       Final Clinical Impression(s) / ED Diagnoses Final diagnoses:  None    Rx / DC Orders ED Discharge Orders     None         Sermon Fairy, MD 03/20/23 1146

## 2023-04-08 NOTE — ED Notes (Signed)
 1135: CODE BLUE/ CPR imitated 1136 1 epi administered 1137: lucus applied 1139: pulse check/ asystole 300 amio in 1140: 1 epi administered 1143: pulse check, no pulse, 1 epi administered 1146:  pulse check, asystole, epi administered 1147: pulse check, no pulse 1147: Time of death, Code ended

## 2023-04-08 NOTE — ED Provider Notes (Signed)
 CRITICAL CARE Performed by: Fairy Sermon Total critical care time: 35 minutes Critical care time was exclusive of separately billable procedures and treating other patients. Critical care was necessary to treat or prevent imminent or life-threatening deterioration. Critical care was time spent personally by me on the following activities: development of treatment plan with patient and/or surrogate as well as nursing, discussions with consultants, evaluation of patient's response to treatment, examination of patient, obtaining history from patient or surrogate, ordering and performing treatments and interventions, ordering and review of laboratory studies, ordering and review of radiographic studies, pulse oximetry and re-evaluation of patient's condition.    Patient was awaiting transfer to [facility] for sepsis and liver failure and respiratory arrest.  He coded again and was in PEA and then asystole.  Patient was given 4 epinephrines and bicarb along with amiodarone .  Patient never regained a pulse back.  He was pronounced dead at 11:47 AM.  Family was notified   Sermon Fairy, MD 03-25-2023 1154

## 2023-04-08 NOTE — ED Notes (Addendum)
 Chart opened by another staff member

## 2023-04-08 NOTE — Telephone Encounter (Unsigned)
 Copied from CRM [id number]. Topic: General - Other >> 2023-04-09 12:01 PM Deleta HERO wrote: Reason for CRM: Zebedee from [facility], calling to inform Dr. Alphonsa that Dr. Harold Scholz is requesting a callback for this patient.  Dr. Gib #: [phone]

## 2023-04-08 NOTE — ED Notes (Signed)
 All belongings are with family.

## 2023-04-08 NOTE — ED Notes (Signed)
Bear hugger applied to pt due to pt being hypothermic.

## 2023-04-08 NOTE — Progress Notes (Signed)
 Intubation Note  Responded to overhead Code Blue Patient unresponsive, PEA, chest compression and AMBU in progress.  DL x 1 with #3 Glidescope Grade I view 7.0 ETT inserted, 23cm at lip. +EtCO2, BBSE No complications.

## 2023-04-08 DEATH — deceased

## 2023-05-15 ENCOUNTER — Ambulatory Visit: Payer: Medicare Other | Admitting: Family Medicine

## 2023-05-23 ENCOUNTER — Ambulatory Visit: Payer: PPO | Admitting: Neurology
